# Patient Record
Sex: Male | Born: 1955
Health system: Southern US, Community
[De-identification: ages and names within clinical notes are randomized; demographics above are authoritative.]

## PROBLEM LIST (undated history)

## (undated) DIAGNOSIS — I619 Nontraumatic intracerebral hemorrhage, unspecified: Secondary | ICD-10-CM

## (undated) DIAGNOSIS — M1990 Unspecified osteoarthritis, unspecified site: Secondary | ICD-10-CM

## (undated) DIAGNOSIS — J189 Pneumonia, unspecified organism: Secondary | ICD-10-CM

## (undated) DIAGNOSIS — G709 Myoneural disorder, unspecified: Secondary | ICD-10-CM

## (undated) DIAGNOSIS — G4730 Sleep apnea, unspecified: Secondary | ICD-10-CM

## (undated) DIAGNOSIS — R410 Disorientation, unspecified: Secondary | ICD-10-CM

## (undated) DIAGNOSIS — R51 Headache: Secondary | ICD-10-CM

## (undated) DIAGNOSIS — G8929 Other chronic pain: Secondary | ICD-10-CM

## (undated) DIAGNOSIS — F19921 Other psychoactive substance use, unspecified with intoxication with delirium: Secondary | ICD-10-CM

## (undated) DIAGNOSIS — R519 Headache, unspecified: Secondary | ICD-10-CM

## (undated) DIAGNOSIS — M542 Cervicalgia: Secondary | ICD-10-CM

## (undated) DIAGNOSIS — I10 Essential (primary) hypertension: Secondary | ICD-10-CM

## (undated) DIAGNOSIS — K219 Gastro-esophageal reflux disease without esophagitis: Secondary | ICD-10-CM

## (undated) DIAGNOSIS — Z87442 Personal history of urinary calculi: Secondary | ICD-10-CM

## (undated) DIAGNOSIS — R0600 Dyspnea, unspecified: Secondary | ICD-10-CM

## (undated) DIAGNOSIS — J449 Chronic obstructive pulmonary disease, unspecified: Secondary | ICD-10-CM

## (undated) DIAGNOSIS — J45909 Unspecified asthma, uncomplicated: Secondary | ICD-10-CM

## (undated) DIAGNOSIS — T50905A Adverse effect of unspecified drugs, medicaments and biological substances, initial encounter: Secondary | ICD-10-CM

## (undated) HISTORY — PX: SHOULDER SURGERY: SHX246

## (undated) HISTORY — PX: NECK SURGERY: SHX720

## (undated) HISTORY — PX: NASAL SINUS SURGERY: SHX719

---

## 2007-06-29 ENCOUNTER — Ambulatory Visit: Payer: Self-pay | Admitting: Family Medicine

## 2007-07-11 ENCOUNTER — Ambulatory Visit: Payer: Self-pay | Admitting: Gastroenterology

## 2007-07-11 LAB — HM COLONOSCOPY

## 2007-12-28 ENCOUNTER — Ambulatory Visit: Payer: Self-pay | Admitting: Family Medicine

## 2008-01-05 ENCOUNTER — Ambulatory Visit: Payer: Self-pay | Admitting: Family Medicine

## 2008-03-07 ENCOUNTER — Ambulatory Visit: Payer: Self-pay | Admitting: Pain Medicine

## 2008-03-20 ENCOUNTER — Ambulatory Visit: Payer: Self-pay | Admitting: Pain Medicine

## 2008-04-05 ENCOUNTER — Ambulatory Visit: Payer: Self-pay | Admitting: Physician Assistant

## 2008-04-24 ENCOUNTER — Ambulatory Visit: Payer: Self-pay | Admitting: Pain Medicine

## 2008-05-10 ENCOUNTER — Ambulatory Visit: Payer: Self-pay | Admitting: Physician Assistant

## 2008-05-17 ENCOUNTER — Ambulatory Visit: Payer: Self-pay | Admitting: Pain Medicine

## 2008-05-31 ENCOUNTER — Ambulatory Visit: Payer: Self-pay | Admitting: Physician Assistant

## 2008-06-29 ENCOUNTER — Ambulatory Visit (HOSPITAL_COMMUNITY): Admission: RE | Admit: 2008-06-29 | Discharge: 2008-06-29 | Payer: Self-pay | Admitting: Neurosurgery

## 2008-11-19 ENCOUNTER — Ambulatory Visit (HOSPITAL_COMMUNITY): Admission: RE | Admit: 2008-11-19 | Discharge: 2008-11-20 | Payer: Self-pay | Admitting: Neurosurgery

## 2009-03-16 ENCOUNTER — Ambulatory Visit: Payer: Self-pay | Admitting: Neurosurgery

## 2009-06-19 ENCOUNTER — Encounter: Admission: RE | Admit: 2009-06-19 | Discharge: 2009-06-19 | Payer: Self-pay | Admitting: Neurology

## 2010-05-25 ENCOUNTER — Encounter: Payer: Self-pay | Admitting: Neurosurgery

## 2010-08-10 LAB — BASIC METABOLIC PANEL
Calcium: 9.9 mg/dL (ref 8.4–10.5)
Creatinine, Ser: 1 mg/dL (ref 0.4–1.5)
GFR calc Af Amer: >60 mL/min (ref >60)
Glucose, Bld: 106 mg/dL — ABNORMAL HIGH (ref 70–99)
Sodium: 136 mEq/L (ref 135–145)

## 2010-08-10 LAB — CBC
Hemoglobin: 15.4 g/dL (ref 13.0–17.0)
MCHC: 35.3 g/dL (ref 30.0–36.0)
RBC: 4.75 MIL/uL (ref 4.22–5.81)
RDW: 12.9 % (ref 11.5–15.5)
WBC: 8.8 10*3/uL (ref 4.0–10.5)

## 2010-08-29 ENCOUNTER — Ambulatory Visit: Payer: Self-pay

## 2010-09-16 NOTE — Op Note (Signed)
NAME:  Joshua Guerrero, Joshua Guerrero NO.:  [id number]   MEDICAL RECORD NO.:  [id number]          PATIENT TYPE:  OIB   LOCATION:  3533                         FACILITY:  [facility]   PHYSICIAN:  Cristi Loron, M.D.DATE OF BIRTH:  19-Nov-1955   DATE OF PROCEDURE:  11/19/2008  DATE OF DISCHARGE:                               OPERATIVE REPORT   BRIEF HISTORY:  The patient is a 55 year old white male who is suffering  from intractable neck pain.  He failed medical management was worked up  with a cervical MRI and some CT, which demonstrated the patient has  spondylosis and foraminal stenosis at C5-6.  I discussed the various  treatment options with the patient including surgery.  The patient has  weighed the risks, benefits, and alternatives of surgery, and decided to  proceed with C5-6 anterior cervical diskectomy/fusion and plating.   PREOPERATIVE DIAGNOSIS:  C5-6 disk degeneration and spondylosis,  stenosis, cervical radiculopathy, cervicalgia.   POSTOPERATIVE DIAGNOSIS:  C5-6 disk degeneration and spondylosis,  stenosis, cervical radiculopathy, cervicalgia.   PROCEDURE:  C5-6 extensive anterior cervical diskectomy/decompression;  C5-6 anterior interbody arthrodesis with local morselized autograft bone  and Actifuse bone graft extender; insertion of C5-6 interbody prosthesis  (Novel PEEK interbody prosthesis); C5-6 anterior cervical  instrumentation with Codman SLIM-LOC titanium plate and screws.   SURGEON:  Cristi Loron, M.D.   ASSISTANT:  Hilda Lias, M.D.   ANESTHESIA:  General endotracheal.   ESTIMATED BLOOD LOSS:  50 mL.   SPECIMENS:  None.   DRAINS:  None.   COMPLICATIONS:  None.   DESCRIPTION OF PROCEDURE:  The patient was brought to the operating room  by the anesthesia team.  General endotracheal anesthesia was induced.  The patient remained in a supine position.  A roll was placed under his  shoulder to place his neck in slight extension.  His  anterior cervical  region was then prepared with Betadine scrub and Betadine solution.  Sterile drapes were applied.  I then injected the area to be incised  with Marcaine with epinephrine solution.  I used a scalpel to make a  transverse incision in the patient's left anterior neck.  I used the  Metzenbaum scissors to divide the platysma muscle and then to dissect  medial to the sternocleidomastoid muscle, jugular vein, and carotid  artery.  I carefully dissected down towards the anterior cervical spine  identifying the esophagus and retracting it medially.  I then used the  Kittner swab to clear the soft tissue from the anterior cervical spine  and we inserted a bent spinal needle into upper exposed intervertebral  disk space.  We obtained intraoperative radiograph to confirm our  location.   We then used electrocautery to detach the medial border of the longus  colli muscle bilaterally from the C5-6 intervertebral disk space.  We  inserted the Caspar self-retaining retractor underneath the longus colli  muscle bilaterally to provide exposure.  We began the decompression by  incising the C5-6 intervertebral disk with a 15-blade scalpel.  We  performed a partial intervertebral diskectomy with the pituitary forceps  and the  Karlin curettes.  We then inserted distraction screws at C5-C6  and distracted the interspace and then we used the high-speed drill to  decorticate the vertebral endplates at C5-6, drilled away the remainder  of C5-6 intervertebral disk, drill away some posterior spondylosis and  to thin out the posterior longitudinal ligament.  We then incised the  ligament with arachnoid knife and then removed the ligament with the  Kerrison punch undercutting the vertebral endplates with Kerrison  punches, decompressing the thecal sac.  We then performed foraminotomies  about the bilateral C6 nerve root, completing the decompression at this  level.   Having completed  decompression, we now turned our attention to  arthrodesis.  We used the trial spacers and determined to use a medium 6-  mm PEEK interbody prosthesis.  We prefilled the prosthesis with a  combination of local morselized autograft bone.  We obtained  decompression as well as Actifuse bone graft extender.  We inserted the  prostheses into the distracted C5-C6 enriched space, we then removed the  distraction screws.  There was a good snug fit of the prosthesis.   We now turned our attention to the anterior spinal instrumentation.  We  used high-speed drill to remove some ventral spondylosis with the plate  will lay down flat.  We selected an appropriate length anterior cervical  plate.  We laid along the anterior aspect of the vertebral bodies at C5-  C6.  We then used a drill to drill two 40-mm holes at C5 two at C6.  We  secured the plate to their vertebral bodies by placing two 40-mm self-  tapping screws at C5 and two at C6.  We then obtained intraoperative  radiograph.  There was limited visualization of the instrumentation  because of the patient's habitus, but it looked good in vivo.  We  therefore, secured these screws to the plate by locking each cam.  This  completed the instrumentation.   We then obtained hemostasis using bipolar electrocautery.  We irrigated  the wound with bacitracin solution.  We then removed the retractor.  We  inspected the esophagus for any damages; there was none apparent.  We  then reapproximated the patient's platysma muscle with interrupted 3-0  Vicryl suture, the subcutaneous tissue with interrupted 3-0 Vicryl  suture, and the skin with Steri-Strips and benzoin.  The wound was then  coated with bacitracin ointment.  A sterile dressing was applied.  The  drapes were removed, and the patient was subsequently extubated by the  anesthesia team and transported to the postanesthesia care unit in  stable condition.  All sponge, instrument, and needle counts  were  correct at the end of this case.      Cristi Loron, M.D.  Electronically Signed     JDJ/MEDQ  D:  11/19/2008  T:  11/20/2008  Job:  [id number]

## 2010-12-04 ENCOUNTER — Ambulatory Visit: Payer: Self-pay | Admitting: Family Medicine

## 2011-12-07 ENCOUNTER — Ambulatory Visit: Payer: Self-pay

## 2011-12-11 ENCOUNTER — Ambulatory Visit: Payer: Self-pay | Admitting: Family Medicine

## 2011-12-12 ENCOUNTER — Emergency Department: Payer: Self-pay | Admitting: Emergency Medicine

## 2011-12-12 LAB — COMPREHENSIVE METABOLIC PANEL
Albumin: 3.3 g/dL — ABNORMAL LOW (ref 3.4–5.0)
Alkaline Phosphatase: 44 U/L — ABNORMAL LOW (ref 50–136)
Calcium, Total: 8.8 mg/dL (ref 8.5–10.1)
Glucose: 112 mg/dL — ABNORMAL HIGH (ref 65–99)
Osmolality: 270 (ref 275–301)
SGPT (ALT): 73 U/L (ref 12–78)
Sodium: 135 mmol/L — ABNORMAL LOW (ref 136–145)

## 2011-12-12 LAB — CBC
MCV: 85 fL (ref 80–100)
Platelet: 258 10*3/uL (ref 150–440)
RDW: 15.2 % — ABNORMAL HIGH (ref 11.5–14.5)
WBC: 7.6 10*3/uL (ref 3.8–10.6)

## 2011-12-12 LAB — URINALYSIS, COMPLETE
Bacteria: NONE SEEN
Blood: NEGATIVE
Glucose,UR: NEGATIVE mg/dL (ref 0–75)
Ketone: NEGATIVE
Nitrite: NEGATIVE
Protein: NEGATIVE
Specific Gravity: 1.003 (ref 1.003–1.030)
WBC UR: NONE SEEN /HPF (ref 0–5)

## 2011-12-28 ENCOUNTER — Ambulatory Visit: Payer: Self-pay | Admitting: Family Medicine

## 2012-05-02 ENCOUNTER — Ambulatory Visit (HOSPITAL_COMMUNITY)
Admission: RE | Admit: 2012-05-02 | Discharge: 2012-05-02 | Disposition: A | Discharge status: Home / Self Care | Payer: PRIVATE HEALTH INSURANCE | Source: Ambulatory Visit | Attending: Physical Medicine and Rehabilitation | Admitting: Physical Medicine and Rehabilitation

## 2012-05-02 ENCOUNTER — Other Ambulatory Visit (HOSPITAL_COMMUNITY): Payer: Self-pay | Admitting: Physical Medicine and Rehabilitation

## 2012-05-02 DIAGNOSIS — M1710 Unilateral primary osteoarthritis, unspecified knee: Secondary | ICD-10-CM

## 2012-05-02 DIAGNOSIS — IMO0002 Reserved for concepts with insufficient information to code with codable children: Secondary | ICD-10-CM | POA: Insufficient documentation

## 2012-05-02 DIAGNOSIS — M898X9 Other specified disorders of bone, unspecified site: Secondary | ICD-10-CM | POA: Insufficient documentation

## 2012-12-29 LAB — TSH: TSH: 1.84 u[IU]/mL (ref <=5.90)

## 2013-05-05 ENCOUNTER — Ambulatory Visit: Payer: Self-pay | Admitting: Physical Medicine and Rehabilitation

## 2014-03-21 ENCOUNTER — Ambulatory Visit: Payer: Self-pay | Admitting: Family Medicine

## 2014-03-21 LAB — BASIC METABOLIC PANEL
BUN: 6 mg/dL (ref 4–21)
Creatinine: 0.6 mg/dL (ref <=1.3)
Glucose: 98 mg/dL
Potassium: 3.8 mmol/L (ref 3.4–5.3)
Sodium: 129 mmol/L — AB (ref 137–147)

## 2014-03-21 LAB — LIPID PANEL
Cholesterol: 205 mg/dL — AB (ref 0–200)
HDL: 35 mg/dL (ref 35–70)
LDL CALC: 130 mg/dL
LDL/HDL RATIO: 3.7
Triglycerides: 201 mg/dL — AB (ref 40–160)

## 2014-03-21 LAB — HEPATIC FUNCTION PANEL
ALT: 19 U/L (ref 10–40)
AST: 21 U/L (ref 14–40)
Alkaline Phosphatase: 40 U/L (ref 25–125)
BILIRUBIN, TOTAL: 0.3 mg/dL

## 2014-08-28 ENCOUNTER — Ambulatory Visit
Admit: 2014-08-28 | Disposition: A | Discharge status: Home / Self Care | Payer: Self-pay | Attending: Family Medicine | Admitting: Family Medicine

## 2014-09-18 LAB — CBC AND DIFFERENTIAL
HCT: 43 % (ref 41–53)
Hemoglobin: 14.8 g/dL (ref 13.5–17.5)
NEUTROS ABS: 81 /uL
Platelets: 321 10*3/uL (ref 150–399)
WBC: 9.2 10*3/mL

## 2014-09-22 DIAGNOSIS — M6282 Rhabdomyolysis: Secondary | ICD-10-CM | POA: Insufficient documentation

## 2014-09-22 DIAGNOSIS — K219 Gastro-esophageal reflux disease without esophagitis: Secondary | ICD-10-CM | POA: Insufficient documentation

## 2014-09-22 DIAGNOSIS — E785 Hyperlipidemia, unspecified: Secondary | ICD-10-CM | POA: Insufficient documentation

## 2014-09-22 DIAGNOSIS — F329 Major depressive disorder, single episode, unspecified: Secondary | ICD-10-CM | POA: Insufficient documentation

## 2014-09-22 DIAGNOSIS — M542 Cervicalgia: Secondary | ICD-10-CM | POA: Insufficient documentation

## 2014-09-22 DIAGNOSIS — F419 Anxiety disorder, unspecified: Secondary | ICD-10-CM | POA: Insufficient documentation

## 2014-09-22 DIAGNOSIS — F32A Depression, unspecified: Secondary | ICD-10-CM | POA: Insufficient documentation

## 2014-09-22 DIAGNOSIS — M549 Dorsalgia, unspecified: Secondary | ICD-10-CM

## 2014-09-22 DIAGNOSIS — G4730 Sleep apnea, unspecified: Secondary | ICD-10-CM | POA: Insufficient documentation

## 2014-09-22 DIAGNOSIS — G8929 Other chronic pain: Secondary | ICD-10-CM | POA: Insufficient documentation

## 2014-09-22 DIAGNOSIS — K429 Umbilical hernia without obstruction or gangrene: Secondary | ICD-10-CM | POA: Insufficient documentation

## 2014-09-22 DIAGNOSIS — D649 Anemia, unspecified: Secondary | ICD-10-CM | POA: Insufficient documentation

## 2014-09-22 DIAGNOSIS — M5412 Radiculopathy, cervical region: Secondary | ICD-10-CM | POA: Insufficient documentation

## 2014-09-22 DIAGNOSIS — E611 Iron deficiency: Secondary | ICD-10-CM | POA: Insufficient documentation

## 2014-09-22 DIAGNOSIS — I10 Essential (primary) hypertension: Secondary | ICD-10-CM | POA: Insufficient documentation

## 2014-10-19 ENCOUNTER — Other Ambulatory Visit: Payer: Self-pay | Admitting: Family Medicine

## 2014-10-31 ENCOUNTER — Other Ambulatory Visit: Payer: Self-pay

## 2014-11-01 ENCOUNTER — Other Ambulatory Visit: Payer: Self-pay | Admitting: Family Medicine

## 2014-11-01 NOTE — Telephone Encounter (Signed)
This is Dr. Alben Spittle patient please review.-aa

## 2014-11-15 ENCOUNTER — Ambulatory Visit: Payer: Self-pay | Admitting: Family Medicine

## 2014-11-20 ENCOUNTER — Ambulatory Visit (INDEPENDENT_AMBULATORY_CARE_PROVIDER_SITE_OTHER): Payer: Medicare Other | Admitting: Family Medicine

## 2014-11-20 ENCOUNTER — Encounter: Payer: Self-pay | Admitting: Family Medicine

## 2014-11-20 VITALS — BP 118/64 | HR 76 | Resp 12 | Ht 68.0 in | Wt 206.0 lb

## 2014-11-20 DIAGNOSIS — J849 Interstitial pulmonary disease, unspecified: Secondary | ICD-10-CM | POA: Diagnosis not present

## 2014-11-20 DIAGNOSIS — F329 Major depressive disorder, single episode, unspecified: Secondary | ICD-10-CM | POA: Diagnosis not present

## 2014-11-20 DIAGNOSIS — Z1211 Encounter for screening for malignant neoplasm of colon: Secondary | ICD-10-CM | POA: Diagnosis not present

## 2014-11-20 DIAGNOSIS — G8929 Other chronic pain: Secondary | ICD-10-CM

## 2014-11-20 DIAGNOSIS — M542 Cervicalgia: Secondary | ICD-10-CM | POA: Diagnosis not present

## 2014-11-20 DIAGNOSIS — M549 Dorsalgia, unspecified: Secondary | ICD-10-CM

## 2014-11-20 DIAGNOSIS — F419 Anxiety disorder, unspecified: Secondary | ICD-10-CM

## 2014-11-20 DIAGNOSIS — F32A Depression, unspecified: Secondary | ICD-10-CM

## 2014-11-20 DIAGNOSIS — M6282 Rhabdomyolysis: Secondary | ICD-10-CM | POA: Insufficient documentation

## 2014-11-20 MED ORDER — ALPRAZOLAM 1 MG PO TABS: ORAL_TABLET | ORAL | Status: DC
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
## 2014-12-04 ENCOUNTER — Other Ambulatory Visit: Payer: Self-pay

## 2014-12-04 ENCOUNTER — Telehealth: Payer: Self-pay | Admitting: Gastroenterology

## 2014-12-04 NOTE — Telephone Encounter (Signed)
Gastroenterology Pre-Procedure Review  Request Date: 12-31-2014 Requesting Physician: Dr. Rosanna Randy  PATIENT REVIEW QUESTIONS: The patient responded to the following health history questions as indicated:    1. Are you having any GI issues? no 2. Do you have a personal history of Polyps? yes (Colon at 81) 3. Do you have a family history of Colon Cancer or Polyps? no 4. Diabetes Mellitus? no 5. Joint replacements in the past 12 months?no 6. Major health problems in the past 3 months?no 7. Any artificial heart valves, MVP, or defibrillator?no    MEDICATIONS & ALLERGIES:    Patient reports the following regarding taking any anticoagulation/antiplatelet therapy:   Plavix, Coumadin, Eliquis, Xarelto, Lovenox, Pradaxa, Brilinta, or Effient? no Aspirin? no  Patient confirms/reports the following medications:  Current Outpatient Prescriptions  Medication Sig Dispense Refill   ALPRAZolam (XANAX) 1 MG tablet Take 1 tablet three times daily as needed 90 tablet 5   bisoprolol-hydrochlorothiazide (ZIAC) 5-6.25 MG per tablet Take by mouth.     cyclobenzaprine (FLEXERIL) 5 MG tablet Take by mouth.     fenofibrate (TRICOR) 145 MG tablet Take by mouth.     fentaNYL (DURAGESIC - DOSED MCG/HR) 50 MCG/HR Place onto the skin.     furosemide (LASIX) 40 MG tablet TAKE ONE TO TWO TABLETS IN THE MORNING AS NEEDED FOR SWELLING 60 tablet 12   lansoprazole (PREVACID) 30 MG capsule Take by mouth.     oxyCODONE-acetaminophen (PERCOCET) 10-325 MG per tablet Take 1 tablet by mouth every 4 (four) hours as needed.  0   promethazine (PHENERGAN) 25 MG tablet Take by mouth.     venlafaxine (EFFEXOR) 75 MG tablet Take by mouth.     No current facility-administered medications for this visit.    Patient confirms/reports the following allergies:  Allergies  Allergen Reactions   Codeine Itching and Nausea And Vomiting    No orders of the defined types were placed in this encounter.    AUTHORIZATION  INFORMATION Primary Insurance: 1D#: Group #:  Secondary Insurance: 1D#: Group #:  SCHEDULE INFORMATION: Date: 12-31-2014 Time: Location:[facility]

## 2014-12-24 ENCOUNTER — Encounter: Payer: Self-pay | Admitting: *Deleted

## 2014-12-25 ENCOUNTER — Encounter: Payer: Self-pay | Admitting: Anesthesiology

## 2015-01-01 ENCOUNTER — Encounter: Payer: Self-pay | Admitting: *Deleted

## 2015-01-10 NOTE — Discharge Instructions (Signed)

## 2015-01-14 ENCOUNTER — Ambulatory Visit: Admission: RE | Admit: 2015-01-14 | Payer: Medicare Other | Source: Ambulatory Visit | Admitting: Gastroenterology

## 2015-01-14 HISTORY — DX: Headache, unspecified: R51.9

## 2015-01-14 HISTORY — DX: Sleep apnea, unspecified: G47.30

## 2015-01-14 HISTORY — DX: Myoneural disorder, unspecified: G70.9

## 2015-01-14 HISTORY — DX: Gastro-esophageal reflux disease without esophagitis: K21.9

## 2015-01-14 HISTORY — DX: Unspecified osteoarthritis, unspecified site: M19.90

## 2015-01-14 HISTORY — DX: Headache: R51

## 2015-01-14 HISTORY — DX: Essential (primary) hypertension: I10

## 2015-01-14 SURGERY — COLONOSCOPY WITH PROPOFOL
Anesthesia: Choice
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
## 2015-02-07 ENCOUNTER — Other Ambulatory Visit: Payer: Self-pay

## 2015-02-08 ENCOUNTER — Encounter: Payer: Self-pay | Admitting: Anesthesiology

## 2015-02-08 NOTE — Discharge Instructions (Signed)

## 2015-02-11 ENCOUNTER — Ambulatory Visit: Payer: Medicare Other | Admitting: Anesthesiology

## 2015-02-11 ENCOUNTER — Encounter
Admission: RE | Disposition: A | Discharge status: Home / Self Care | Payer: Self-pay | Source: Ambulatory Visit | Attending: Gastroenterology

## 2015-02-11 ENCOUNTER — Ambulatory Visit
Admission: RE | Admit: 2015-02-11 | Discharge: 2015-02-11 | Disposition: A | Discharge status: Home / Self Care | Payer: Medicare Other | Source: Ambulatory Visit | Attending: Gastroenterology | Admitting: Gastroenterology

## 2015-02-11 ENCOUNTER — Encounter: Payer: Self-pay | Admitting: Anesthesiology

## 2015-02-11 DIAGNOSIS — Z79899 Other long term (current) drug therapy: Secondary | ICD-10-CM | POA: Diagnosis not present

## 2015-02-11 DIAGNOSIS — F17210 Nicotine dependence, cigarettes, uncomplicated: Secondary | ICD-10-CM | POA: Insufficient documentation

## 2015-02-11 DIAGNOSIS — K219 Gastro-esophageal reflux disease without esophagitis: Secondary | ICD-10-CM | POA: Diagnosis not present

## 2015-02-11 DIAGNOSIS — M1990 Unspecified osteoarthritis, unspecified site: Secondary | ICD-10-CM | POA: Insufficient documentation

## 2015-02-11 DIAGNOSIS — Z860100 Personal history of colon polyps, unspecified: Secondary | ICD-10-CM | POA: Insufficient documentation

## 2015-02-11 DIAGNOSIS — K639 Disease of intestine, unspecified: Secondary | ICD-10-CM | POA: Diagnosis not present

## 2015-02-11 DIAGNOSIS — Z885 Allergy status to narcotic agent status: Secondary | ICD-10-CM | POA: Insufficient documentation

## 2015-02-11 DIAGNOSIS — D125 Benign neoplasm of sigmoid colon: Secondary | ICD-10-CM | POA: Diagnosis not present

## 2015-02-11 DIAGNOSIS — I10 Essential (primary) hypertension: Secondary | ICD-10-CM | POA: Insufficient documentation

## 2015-02-11 DIAGNOSIS — K640 First degree hemorrhoids: Secondary | ICD-10-CM | POA: Diagnosis not present

## 2015-02-11 DIAGNOSIS — D124 Benign neoplasm of descending colon: Secondary | ICD-10-CM

## 2015-02-11 DIAGNOSIS — R252 Cramp and spasm: Secondary | ICD-10-CM | POA: Diagnosis not present

## 2015-02-11 DIAGNOSIS — G4730 Sleep apnea, unspecified: Secondary | ICD-10-CM | POA: Insufficient documentation

## 2015-02-11 HISTORY — PX: COLONOSCOPY WITH PROPOFOL: SHX5780

## 2015-02-11 HISTORY — DX: Disorientation, unspecified: R41.0

## 2015-02-11 HISTORY — DX: Adverse effect of unspecified drugs, medicaments and biological substances, initial encounter: T50.905A

## 2015-02-11 HISTORY — DX: Other psychoactive substance use, unspecified with intoxication with delirium: F19.921

## 2015-02-11 SURGERY — COLONOSCOPY WITH PROPOFOL
Anesthesia: Monitor Anesthesia Care

## 2015-02-11 MED ORDER — STERILE WATER FOR IRRIGATION IR SOLN
Status: DC | PRN
  Administered 2015-02-11: 08:00:00

## 2015-02-11 MED ORDER — LACTATED RINGERS IV SOLN
INTRAVENOUS | Status: DC
  Administered 2015-02-11: 08:00:00 via INTRAVENOUS

## 2015-02-11 MED ORDER — LIDOCAINE HCL (CARDIAC) 20 MG/ML IV SOLN
INTRAVENOUS | Status: DC | PRN
  Administered 2015-02-11: 30 mg via INTRAVENOUS

## 2015-02-11 MED ORDER — PROPOFOL 10 MG/ML IV BOLUS
INTRAVENOUS | Status: DC | PRN
  Administered 2015-02-11: 30 mg via INTRAVENOUS
  Administered 2015-02-11 (×2): 40 mg via INTRAVENOUS
  Administered 2015-02-11: 150 mg via INTRAVENOUS
  Administered 2015-02-11: 20 mg via INTRAVENOUS
  Administered 2015-02-11 (×2): 40 mg via INTRAVENOUS

## 2015-02-11 SURGICAL SUPPLY — 28 items
CANISTER SUCT 1200ML W/VALVE (MISCELLANEOUS) ×2 IMPLANT
FCP ESCP3.2XJMB 240X2.8X (MISCELLANEOUS)
FORCEPS BIOP RAD 4 LRG CAP 4 (CUTTING FORCEPS) ×2 IMPLANT
FORCEPS BIOP RJ4 240 W/NDL (MISCELLANEOUS)
FORCEPS ESCP3.2XJMB 240X2.8X (MISCELLANEOUS) IMPLANT
GOWN CVR UNV OPN BCK APRN NK (MISCELLANEOUS) ×2 IMPLANT
GOWN ISOL THUMB LOOP REG UNIV (MISCELLANEOUS) ×2
HEMOCLIP INSTINCT (CLIP) IMPLANT
INJECTOR VARIJECT VIN23 (MISCELLANEOUS) IMPLANT
KIT CO2 TUBING (TUBING) IMPLANT
KIT DEFENDO VALVE AND CONN (KITS) IMPLANT
KIT ENDO PROCEDURE OLY (KITS) ×2 IMPLANT
LIGATOR MULTIBAND 6SHOOTER MBL (MISCELLANEOUS) IMPLANT
MARKER SPOT ENDO TATTOO 5ML (MISCELLANEOUS) IMPLANT
PAD GROUND ADULT SPLIT (MISCELLANEOUS) IMPLANT
SNARE SHORT THROW 13M SML OVAL (MISCELLANEOUS) IMPLANT
SNARE SHORT THROW 30M LRG OVAL (MISCELLANEOUS) IMPLANT
SPOT EX ENDOSCOPIC TATTOO (MISCELLANEOUS)
SUCTION POLY TRAP 4CHAMBER (MISCELLANEOUS) IMPLANT
TRAP SUCTION POLY (MISCELLANEOUS) IMPLANT
TUBING CONN 6MMX3.1M (TUBING)
TUBING SUCTION CONN 0.25 STRL (TUBING) IMPLANT
UNDERPAD 30X60 958B10 (PK) (MISCELLANEOUS) IMPLANT
VALVE BIOPSY ENDO (VALVE) IMPLANT
VARIJECT INJECTOR VIN23 (MISCELLANEOUS)
WATER AUXILLARY (MISCELLANEOUS) IMPLANT
WATER STERILE IRR 250ML POUR (IV SOLUTION) ×2 IMPLANT
WATER STERILE IRR 500ML POUR (IV SOLUTION) IMPLANT

## 2015-02-11 NOTE — Anesthesia Postprocedure Evaluation (Signed)
  Anesthesia Post-op Note  Patient: Joshua Guerrero  Procedure(s) Performed: Procedure(s) with comments: COLONOSCOPY WITH PROPOFOL (N/A) - USES C-PAP  Anesthesia type:MAC  Patient location: PACU  Post pain: Pain level controlled  Post assessment: Post-op Vital signs reviewed, Patient's Cardiovascular Status Stable, Respiratory Function Stable, Patent Airway and No signs of Nausea or vomiting  Post vital signs: Reviewed and stable  Last Vitals:  Filed Vitals:   02/11/15 0830  BP: 111/76  Pulse: 80  Temp:   Resp: 19    Level of consciousness: awake, alert  and patient cooperative  Complications: No apparent anesthesia complications

## 2015-02-11 NOTE — Transfer of Care (Signed)
Immediate Anesthesia Transfer of Care Note  Patient: Joshua Guerrero  Procedure(s) Performed: Procedure(s) with comments: COLONOSCOPY WITH PROPOFOL (N/A) - USES C-PAP  Patient Location: PACU  Anesthesia Type: MAC  Level of Consciousness: awake, alert  and patient cooperative  Airway and Oxygen Therapy: Patient Spontanous Breathing and Patient connected to supplemental oxygen  Post-op Assessment: Post-op Vital signs reviewed, Patient's Cardiovascular Status Stable, Respiratory Function Stable, Patent Airway and No signs of Nausea or vomiting  Post-op Vital Signs: Reviewed and stable  Complications: No apparent anesthesia complications

## 2015-02-11 NOTE — H&P (Signed)
[facility]  [address], [place], [place] [address] Phone: [phone] Fax : [phone]  Primary Care Physician:  Joshua Durie, MD Primary Gastroenterologist:  Dr. Allen Guerrero  Pre-Procedure History & Physical: HPI:  Joshua Guerrero is a 59 y.o. male is here for an colonoscopy.   Past Medical History  Diagnosis Date  . Headache     past hx, secondary to nerve damage from accident  . Hypertension     CONTROLLED ON MEDS  . 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 Confusion caused by a drug (Joshua Guerrero)     fentanyl and oxycodone  . Arthritis     lower back  . GERD (gastroesophageal reflux disease)   . Sleep apnea     USES C-PAP    Past Surgical History  Procedure Laterality Date  . Nasal sinus surgery      due to severe sleep apnea  . Shoulder surgery Bilateral     x 2  . Neck surgery      diffuse disc-and put in possibly screws patient thinks    Prior to Admission medications   Medication Sig Start Date End Date Taking? Authorizing Provider  ALPRAZolam Joshua Guerrero) 1 MG tablet Take 1 tablet three times daily as needed Patient taking differently: Take 1 tablet three times daily as needed/ normally evening and bedtime 11/20/14  Yes Joshua Maceo Pro., MD  Ascorbic Acid (VITAMIN C PO) Take by mouth. am   Yes Historical Provider, MD  B Complex Vitamins (VITAMIN B COMPLEX PO) Take by mouth. am   Yes Historical Provider, MD  bisoprolol-hydrochlorothiazide [facility]) 5-6.25 MG per tablet Take by mouth daily. am 03/27/14  Yes Historical Provider, MD  cyclobenzaprine (FLEXERIL) 5 MG tablet Take 5 mg by mouth 3 (three) times daily as needed.  07/16/11  Yes Historical Provider, MD  fenofibrate (TRICOR) 145 MG tablet Take by mouth. am 07/16/14  Yes Historical Provider, MD  fentaNYL (DURAGESIC - DOSED MCG/HR) 50 MCG/HR Place onto the skin.  12/15/11  Yes Historical Provider, MD  IRON PO Take by mouth daily. am   Yes Historical  Provider, MD  lansoprazole (PREVACID) 30 MG capsule Take 30 mg by mouth 2 (two) times daily before a meal. As needed 03/27/14  Yes Historical Provider, MD  Multiple Vitamin (MULTIVITAMIN) capsule Take 1 capsule by mouth daily.   Yes Historical Provider, MD  oxyCODONE-acetaminophen (PERCOCET) 10-325 MG per tablet Take 1 tablet by mouth every 4 (four) hours as needed. 11/13/14  Yes Historical Provider, MD  promethazine (PHENERGAN) 25 MG tablet Take by mouth every 6 (six) hours as needed.  07/16/14  Yes Historical Provider, MD  furosemide (LASIX) 40 MG tablet TAKE ONE TO TWO TABLETS IN THE MORNING AS NEEDED FOR SWELLING Patient not taking: Reported on 02/07/2015 10/19/14   Joshua Guerrero., MD  venlafaxine [facility]) 75 MG tablet Take by mouth. 09/18/14   Historical Provider, MD    Allergies as of 02/07/2015 - Review Complete 02/07/2015  Allergen Reaction Noted  . Codeine Itching and Nausea And Vomiting 09/22/2014  . Opana [oxymorphone] Swelling 12/24/2014    Family History  Problem Relation Age of Onset  . Dementia Father     Social History   Social History  . Marital Status: Divorced    Spouse Name: single  . Number of Children: 0  . Years of Education: 12   Occupational History  . disability  Social History Main Topics  . Smoking status: Light Tobacco Smoker -- 0.25 packs/day for 20 years    Types: Cigarettes  . Smokeless tobacco: Never Used     Comment: Had quit for 8 yrs. Father died 2023-01-22. has had several cigarettes last few days.  . Alcohol Use: No  . Drug Use: No  . Sexual Activity: No   Other Topics Concern  . Not on file   Social History Narrative    Review of Systems: See HPI, otherwise negative ROS  Physical Exam: BP 154/98 mmHg  Pulse 84  Temp(Src) 97.9 F (36.6 C) (Temporal)  Resp 16  Ht 5\' 8"  (1.727 m)  Wt 197 lb (89.359 kg)  BMI 29.96 kg/m2  SpO2 98% General:   Alert,  pleasant and cooperative in NAD Head:  Normocephalic and atraumatic. Neck:   Supple; no masses or thyromegaly. Lungs:  Clear throughout to auscultation.    Heart:  Regular rate and rhythm. Abdomen:  Soft, nontender and nondistended. Normal bowel sounds, without guarding, and without rebound.   Neurologic:  Alert and  oriented x4;  grossly normal neurologically.  Impression/Plan: Joshua Guerrero is here for an colonoscopy to be performed for history of colon polyps.  Risks, benefits, limitations, and alternatives regarding  colonoscopy have been reviewed with the patient.  Questions have been answered.  All parties agreeable.   Joshua Bowl, MD  02/11/2015, 7:56 AM

## 2015-02-11 NOTE — Anesthesia Preprocedure Evaluation (Signed)
Anesthesia Evaluation  Patient identified by MRN, date of birth, ID band  Reviewed: NPO status   History of Anesthesia Complications Negative for: history of anesthetic complications  Airway Mallampati: II  TM Distance: >3 FB Neck ROM: limited   Comment: Limited neck extension due to neck surgery Dental  (+) Missing Poor dentition:   Pulmonary sleep apnea and Continuous Positive Airway Pressure Ventilation , Current Smoker,    Pulmonary exam normal        Cardiovascular Exercise Tolerance: Good hypertension, Normal cardiovascular exam     Neuro/Psych  Headaches, Anxiety chronic back and Neck pain; arm and leg from 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  negative psych ROS   GI/Hepatic Neg liver ROS, GERD  Controlled,  Endo/Other  negative endocrine ROS  Renal/GU negative Renal ROS  negative genitourinary   Musculoskeletal  (+) Arthritis , Muscle cramps    Abdominal   Peds  Hematology negative hematology ROS (+)   Anesthesia Other Findings   Reproductive/Obstetrics                             Anesthesia Physical Anesthesia Plan  ASA: II  Anesthesia Plan: MAC   Post-op Pain Management:    Induction:   Airway Management Planned:   Additional Equipment:   Intra-op Plan:   Post-operative Plan:   Informed Consent: I have reviewed the patients History and Physical, chart, labs and discussed the procedure including the risks, benefits and alternatives for the proposed anesthesia with the patient or authorized representative who has indicated his/her understanding and acceptance.     Plan Discussed with: CRNA  Anesthesia Plan Comments:         Anesthesia Quick Evaluation

## 2015-02-11 NOTE — Anesthesia Procedure Notes (Signed)
Procedure Name: MAC Date/Time: 02/11/2015 8:01 AM Performed by: Cameron Ali Pre-anesthesia Checklist: Patient identified, Emergency Drugs available, Suction available, Timeout performed and Patient being monitored Patient Re-evaluated:Patient Re-evaluated prior to inductionOxygen Delivery Method: Nasal cannula Placement Confirmation: positive ETCO2

## 2015-02-11 NOTE — Op Note (Signed)
[facility] Patient Name: Joshua Guerrero Procedure Date: 02/11/2015 7:58 AM MRN: [id number] Account #: [id number] Date of Birth: 1955/10/17 Admit Type: Outpatient Age: 59 Room: [facility] OR ROOM 01 Gender: Male Note Status: Finalized Procedure:         Colonoscopy Indications:       High risk colon cancer surveillance: Personal history of                     colonic polyps Providers:         Lucilla Lame, MD Referring MD:      [name], MD (Referring MD) Medicines:         Propofol per Anesthesia Complications:     No immediate complications. Procedure:         Pre-Anesthesia Assessment:                    - Prior to the procedure, a History and Physical was                     performed, and patient medications and allergies were                     reviewed. The patient's tolerance of previous anesthesia                     was also reviewed. The risks and benefits of the procedure                     and the sedation options and risks were discussed with the                     patient. All questions were answered, and informed consent                     was obtained. Prior Anticoagulants: The patient has taken                     no previous anticoagulant or antiplatelet agents. ASA                     Grade Assessment: II - A patient with mild systemic                     disease. After reviewing the risks and benefits, the                     patient was deemed in satisfactory condition to undergo                     the procedure.                    After obtaining informed consent, the colonoscope was                     passed under direct vision. Throughout the procedure, the                     patient's blood pressure, pulse, and oxygen saturations                     were monitored continuously. The was introduced through  the anus and advanced to the the cecum, identified by                     appendiceal  orifice and ileocecal valve. The colonoscopy                     was performed without difficulty. The patient tolerated                     the procedure well. The quality of the bowel preparation                     was fair. Findings:      The perianal and digital rectal examinations were normal.      A diffuse area of severely erythematous mucosa was found in the       transverse colon, at the hepatic flexure, in the ascending colon and in       the cecum. Biopsies were taken with a cold forceps for histology.      A 5 mm polyp was found in the descending colon. The polyp was sessile.       The polyp was removed with a cold biopsy forceps. Resection and       retrieval were complete.      Two sessile polyps were found in the sigmoid colon. The polyps were 3 to       5 mm in size. These polyps were removed with a cold biopsy forceps.       Resection and retrieval were complete.      Non-bleeding internal hemorrhoids were found during retroflexion. The       hemorrhoids were Grade I (internal hemorrhoids that do not prolapse). Impression:        - Erythematous mucosa in the transverse colon, at the                     hepatic flexure, in the ascending colon and in the cecum.                     Biopsied.                    - Right colon cosistent with ischemic colitis.                    - One 5 mm polyp in the descending colon. Resected and                     retrieved.                    - Two 3 to 5 mm polyps in the sigmoid colon. Resected and                     retrieved.                    - Non-bleeding internal hemorrhoids. Recommendation:    - Await pathology results. Procedure Code(s): --- Professional ---                    [phone], Colonoscopy, flexible; with biopsy, single or                     multiple Diagnosis Code(s): --- Professional ---  Z86.010, Personal history of colonic polyps                    K63.9, Disease of intestine, unspecified                     D12.4, Benign neoplasm of descending colon                    D12.5, Benign neoplasm of sigmoid colon CPT copyright 2014 American Medical Association. All rights reserved. The codes documented in this report are preliminary and upon coder review may  be revised to meet current compliance requirements. Lucilla Lame, MD 02/11/2015 8:25:32 AM This report has been signed electronically. Number of Addenda: 0 Note Initiated On: 02/11/2015 7:58 AM Scope Withdrawal Time: 0 hours 8 minutes 57 seconds  Total Procedure Duration: 0 hours 13 minutes 32 seconds       [facility]

## 2015-02-12 ENCOUNTER — Encounter: Payer: Self-pay | Admitting: Gastroenterology

## 2015-02-20 ENCOUNTER — Ambulatory Visit (INDEPENDENT_AMBULATORY_CARE_PROVIDER_SITE_OTHER): Payer: Medicare Other | Admitting: Family Medicine

## 2015-02-20 ENCOUNTER — Encounter: Payer: Self-pay | Admitting: Family Medicine

## 2015-02-20 VITALS — BP 128/70 | HR 68 | Temp 98.8°F | Resp 16 | Wt 205.0 lb

## 2015-02-20 DIAGNOSIS — K529 Noninfective gastroenteritis and colitis, unspecified: Secondary | ICD-10-CM

## 2015-02-20 DIAGNOSIS — M542 Cervicalgia: Secondary | ICD-10-CM | POA: Diagnosis not present

## 2015-02-20 DIAGNOSIS — F419 Anxiety disorder, unspecified: Secondary | ICD-10-CM

## 2015-02-20 DIAGNOSIS — F329 Major depressive disorder, single episode, unspecified: Secondary | ICD-10-CM

## 2015-02-20 DIAGNOSIS — M549 Dorsalgia, unspecified: Secondary | ICD-10-CM

## 2015-02-20 DIAGNOSIS — I10 Essential (primary) hypertension: Secondary | ICD-10-CM

## 2015-02-20 DIAGNOSIS — G8929 Other chronic pain: Secondary | ICD-10-CM | POA: Diagnosis not present

## 2015-02-20 DIAGNOSIS — Z23 Encounter for immunization: Secondary | ICD-10-CM

## 2015-02-20 DIAGNOSIS — F32A Depression, unspecified: Secondary | ICD-10-CM

## 2015-02-20 MED ORDER — METOPROLOL SUCCINATE ER 25 MG PO TB24: 25.0000 mg | ORAL_TABLET | Freq: Every day | ORAL | Status: DC

## 2015-02-20 MED ORDER — PROMETHAZINE HCL 25 MG PO TABS: 25.0000 mg | ORAL_TABLET | Freq: Four times a day (QID) | ORAL | Status: DC | PRN

## 2015-02-20 MED ORDER — HYDROCHLOROTHIAZIDE 12.5 MG PO TABS: 12.5000 mg | ORAL_TABLET | Freq: Every day | ORAL | Status: DC

## 2015-02-20 NOTE — Progress Notes (Signed)
Patient ID: Joshua Guerrero, male   DOB: 04/30/1956, 59 y.o.   MRN: [id number]    Subjective:  HPI  Depression follow up: Patient is here for follow up. Patient states he tried Effexor 2 tablets daily for about 1 month and it made him too space, then he tried 1 tablet daily and now he is not on any medication. He feels about the same, can not tell a difference, he states more of an anxiety issue then depression right now. He does take Xanax 1/2 tablet in the morning, 1/2 tablet about 3 pm and then 9 pm 1 tablet-sometimes will take another tablet during the night if he can not fall asleep.  Chronic pain Symptoms are the same. He takes Oxycodone- every 4 hours, and applies patch-he uses Phenergan to help with nausea from the patches.  Prior to Admission medications   Medication Sig Start Date End Date Taking? Authorizing Provider  ALPRAZolam (XANAX) 1 MG tablet Take 1 tablet three times daily as needed Patient taking differently: Take 1 tablet three times daily as needed/ normally evening and bedtime 11/20/14  Yes Richard L Gilbert Jr., MD  Ascorbic Acid (VITAMIN C PO) Take by mouth. am   Yes Historical Provider, MD  B Complex Vitamins (VITAMIN B COMPLEX PO) Take by mouth. am   Yes Historical Provider, MD  bisoprolol-hydrochlorothiazide (ZIAC) 5-6.25 MG per tablet Take by mouth daily. am 03/27/14  Yes Historical Provider, MD  cyclobenzaprine (FLEXERIL) 5 MG tablet Take 5 mg by mouth 3 (three) times daily as needed.  07/16/11  Yes Historical Provider, MD  fenofibrate (TRICOR) 145 MG tablet Take by mouth. am 07/16/14  Yes Historical Provider, MD  fentaNYL (DURAGESIC - DOSED MCG/HR) 50 MCG/HR Place onto the skin.  12/15/11  Yes Historical Provider, MD  furosemide (LASIX) 40 MG tablet TAKE ONE TO TWO TABLETS IN THE MORNING AS NEEDED FOR SWELLING 10/19/14  Yes Richard L Gilbert Jr., MD  IRON PO Take by mouth daily. am   Yes Historical Provider, MD  lansoprazole (PREVACID) 30 MG capsule Take 30 mg by mouth  2 (two) times daily before a meal. As needed 03/27/14  Yes Historical Provider, MD  Multiple Vitamin (MULTIVITAMIN) capsule Take 1 capsule by mouth daily.   Yes Historical Provider, MD  oxyCODONE-acetaminophen (PERCOCET) 10-325 MG per tablet Take 1 tablet by mouth every 4 (four) hours as needed. 11/13/14  Yes Historical Provider, MD  promethazine (PHENERGAN) 25 MG tablet Take by mouth every 6 (six) hours as needed.  07/16/14  Yes Historical Provider, MD  venlafaxine (EFFEXOR) 75 MG tablet Take by mouth. 09/18/14  Yes Historical Provider, MD    Patient Active Problem List   Diagnosis Date Noted  . Personal history of colonic polyps   . Disease of colon   . Benign neoplasm of descending colon   . Benign neoplasm of sigmoid colon   . Rhabdomyolysis 11/20/2014  . Absolute anemia 09/22/2014  . Anxiety 09/22/2014  . 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 Back pain, chronic 09/22/2014  . Chronic cervical pain 09/22/2014  . Clinical depression 09/22/2014  . Acid reflux 09/22/2014  . HLD (hyperlipidemia) 09/22/2014  . BP (high blood pressure) 09/22/2014  . Iron deficiency 09/22/2014  . Disease characterized by destruction of skeletal muscle 09/22/2014  . Apnea, sleep 09/22/2014  . Umbilical hernia without obstruction and without gangrene 09/22/2014    Past Medical History  Diagnosis Date  . Headache     past hx, secondary to nerve damage   from accident  . Hypertension     CONTROLLED ON MEDS  . 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 Confusion caused by a drug ([facility])     fentanyl and oxycodone  . Arthritis     lower back  . GERD (gastroesophageal reflux disease)   . Sleep apnea     USES C-PAP    Social History   Social History  . Marital Status: Divorced    Spouse Name: single  . Number of Children: 0  . Years of Education: 12   Occupational History  . disability    Social History Main Topics  . Smoking status: Light Tobacco  Smoker -- 0.25 packs/day for 20 years    Types: Cigarettes  . Smokeless tobacco: Never Used     Comment: Had quit for 8 yrs. Father died 8/21. has had several cigarettes last few days.  . Alcohol Use: No  . Drug Use: No  . Sexual Activity: No   Other Topics Concern  . Not on file   Social History Narrative    Allergies  Allergen Reactions  . Codeine Itching and Nausea And Vomiting  . Opana [Oxymorphone] Swelling    Review of Systems  Constitutional: Positive for malaise/fatigue.  Respiratory: Negative.   Cardiovascular: Negative.   Gastrointestinal: Positive for nausea and diarrhea.  Musculoskeletal: Positive for back pain, joint pain and neck pain.  Endo/Heme/Allergies: Negative.   Psychiatric/Behavioral: The patient is nervous/anxious and has insomnia.     Immunization History  Administered Date(s) Administered  . Tdap 07/31/2008   Objective:  BP 128/70 mmHg  Pulse 68  Temp(Src) 98.8 F (37.1 C)  Resp 16  Wt 205 lb (92.987 kg)  Physical Exam  Constitutional: He is oriented to person, place, and time and well-developed, well-nourished, and in no distress.  HENT:  Head: Normocephalic and atraumatic.  Right Ear: External ear normal.  Left Ear: External ear normal.  Nose: Nose normal.  Eyes: Conjunctivae are normal.  Neck: Neck supple.  Cardiovascular: Normal rate, regular rhythm and normal heart sounds.   Pulmonary/Chest: Effort normal and breath sounds normal.  Abdominal: Soft.  Neurological: He is alert and oriented to person, place, and time.  Skin: Skin is warm and dry.  Psychiatric: Mood, memory, affect and judgment normal.    Lab Results  Component Value Date   WBC 9.2 09/18/2014   HGB 14.8 09/18/2014   HCT 43 09/18/2014   PLT 321 09/18/2014   GLUCOSE 112* 12/12/2011   CHOL 205* 03/21/2014   TRIG 201* 03/21/2014   HDL 35 03/21/2014   LDLCALC 130 03/21/2014   TSH 1.84 12/29/2012    CMP     Component Value Date/Time   NA 129* 03/21/2014     NA 135* 12/12/2011 1740   NA 136 11/13/2008 1418   K 3.8 03/21/2014   K 3.0* 12/12/2011 1740   CL 94* 12/12/2011 1740   CL 101 11/13/2008 1418   CO2 34* 12/12/2011 1740   CO2 27 11/13/2008 1418   GLUCOSE 112* 12/12/2011 1740   GLUCOSE 106* 11/13/2008 1418   BUN 6 03/21/2014   BUN 9 12/12/2011 1740   BUN 8 11/13/2008 1418   CREATININE 0.6 03/21/2014   CREATININE 1.09 12/12/2011 1740   CREATININE 1.00 11/13/2008 1418   CALCIUM 8.8 12/12/2011 1740   CALCIUM 9.9 11/13/2008 1418   PROT 6.9 12/12/2011 1740   ALBUMIN 3.3* 12/12/2011 1740   AST 21   03/21/2014   AST 83* 12/12/2011 1740   ALT 19 03/21/2014   ALT 73 12/12/2011 1740   ALKPHOS 40 03/21/2014   ALKPHOS 44* 12/12/2011 1740   BILITOT 0.3 12/12/2011 1740   GFRNONAA >60 12/12/2011 1740   GFRNONAA >60 11/13/2008 1418   GFRAA >60 12/12/2011 1740   GFRAA  11/13/2008 1418    >60        The eGFR has been calculated using the MDRD equation. This calculation has not been validated in all clinical situations. eGFR's persistently <60 mL/min signify possible Chronic Kidney Disease.    Assessment and Plan :  1. Essential hypertension  - CBC with Differential/Platelet - Comprehensive metabolic panel  2. Clinical depression   3. Chronic cervical pain   4. Back pain, chronic Per Dr Bodeo in Pain Clinic.  5. Anxiety  - TSH  6. Need for influenza vaccination  - Flu Vaccine QUAD 36+ mos IM  7. Colitis May need GI evaluation. - Sed Rate (ESR)  I have done the exam and reviewed the above chart and it is accurate to the best of my knowledge.  Richard Gilbert MD [facility] 02/20/2015 3:41 PM 

## 2015-02-21 LAB — CBC WITH DIFFERENTIAL/PLATELET
BASOS ABS: 0 10*3/uL (ref 0.0–0.2)
Basos: 0 %
EOS (ABSOLUTE): 0.2 10*3/uL (ref 0.0–0.4)
Eos: 2 %
HEMOGLOBIN: 16 g/dL (ref 12.6–17.7)
Hematocrit: 45.9 % (ref 37.5–51.0)
IMMATURE GRANS (ABS): 0.1 10*3/uL (ref 0.0–0.1)
IMMATURE GRANULOCYTES: 1 %
LYMPHS: 11 %
Lymphocytes Absolute: 1.2 10*3/uL (ref 0.7–3.1)
MCH: 32.3 pg (ref 26.6–33.0)
MCHC: 34.9 g/dL (ref 31.5–35.7)
MCV: 93 fL (ref 79–97)
MONOCYTES: 4 %
Monocytes Absolute: 0.4 10*3/uL (ref 0.1–0.9)
NEUTROS ABS: 8.7 10*3/uL — AB (ref 1.4–7.0)
NEUTROS PCT: 82 %
PLATELETS: 408 10*3/uL — AB (ref 150–379)
RBC: 4.96 x10E6/uL (ref 4.14–5.80)
RDW: 13.8 % (ref 12.3–15.4)
WBC: 10.5 10*3/uL (ref 3.4–10.8)

## 2015-02-21 LAB — COMPREHENSIVE METABOLIC PANEL
ALBUMIN: 4.4 g/dL (ref 3.5–5.5)
ALT: 19 IU/L (ref 0–44)
AST: 15 IU/L (ref 0–40)
Albumin/Globulin Ratio: 2 (ref 1.1–2.5)
Alkaline Phosphatase: 40 IU/L (ref 39–117)
BUN / CREAT RATIO: 16 (ref 9–20)
BUN: 11 mg/dL (ref 6–24)
Bilirubin Total: 0.3 mg/dL (ref 0.0–1.2)
CALCIUM: 10.2 mg/dL (ref 8.7–10.2)
CHLORIDE: 97 mmol/L (ref 97–106)
CO2: 26 mmol/L (ref 18–29)
CREATININE: 0.67 mg/dL — AB (ref 0.76–1.27)
GFR, EST AFRICAN AMERICAN: 123 mL/min/{1.73_m2} (ref >59)
GFR, EST NON AFRICAN AMERICAN: 107 mL/min/{1.73_m2} (ref >59)
GLUCOSE: 112 mg/dL — AB (ref 65–99)
Globulin, Total: 2.2 g/dL (ref 1.5–4.5)
Potassium: 5 mmol/L (ref 3.5–5.2)
Sodium: 141 mmol/L (ref 136–144)
TOTAL PROTEIN: 6.6 g/dL (ref 6.0–8.5)

## 2015-02-21 LAB — SEDIMENTATION RATE: Sed Rate: 2 mm/hr (ref 0–30)

## 2015-02-21 LAB — TSH: TSH: 0.756 u[IU]/mL (ref 0.450–4.500)

## 2015-02-21 NOTE — Progress Notes (Signed)
Advised  ED 

## 2015-02-26 ENCOUNTER — Ambulatory Visit: Payer: Self-pay | Admitting: Gastroenterology

## 2015-02-26 ENCOUNTER — Other Ambulatory Visit: Payer: Self-pay

## 2015-03-21 ENCOUNTER — Telehealth: Payer: Self-pay | Admitting: Family Medicine

## 2015-03-21 NOTE — Telephone Encounter (Signed)
Pt is returning call.  Pt is a Dr Rosanna Randy pt/MW

## 2015-03-21 NOTE — Telephone Encounter (Signed)
appt needed?-aa

## 2015-03-21 NOTE — Telephone Encounter (Signed)
No answer and mailbox is full, can not leave a message-aa

## 2015-03-21 NOTE — Telephone Encounter (Signed)
He would need to be seen for this. When necessary he has a chronic Phenergan order for nausea. May have to be Simona Huh or [place].

## 2015-03-21 NOTE — Telephone Encounter (Signed)
Pt advised and appt made for tomorrow.-aa

## 2015-03-21 NOTE — Telephone Encounter (Signed)
Pt states he has a fever, throwing up and also has sinus congestion.  Pt has a headache and feels weak.  Pt states this started Wednesday.  Pt is requesting a Rx to help with this if possible. Pt does not feel like he can come in.  [facility].  CB#[phone]/MW

## 2015-03-21 NOTE — Telephone Encounter (Signed)
Left message to call back for more info. Dr. Caryn Section, would patient need to come in or would you be willing to send something in? Please advise. Thanks!

## 2015-03-22 ENCOUNTER — Ambulatory Visit: Payer: Self-pay | Admitting: Family Medicine

## 2015-03-27 ENCOUNTER — Telehealth: Payer: Self-pay

## 2015-03-27 MED ORDER — METOPROLOL SUCCINATE ER 50 MG PO TB24: 50.0000 mg | ORAL_TABLET | Freq: Every day | ORAL | Status: DC

## 2015-03-27 MED ORDER — HYDROCHLOROTHIAZIDE 25 MG PO TABS: 25.0000 mg | ORAL_TABLET | Freq: Every day | ORAL | Status: DC

## 2015-03-27 NOTE — Telephone Encounter (Signed)
Pt advised and medication increased-aa

## 2015-03-27 NOTE — Telephone Encounter (Signed)
Patient states that his B/P has been running high 178 and 160 on the top and the bottom number 80-60, he has felt swimmy headed and can tell B/P running high. In October we switched his Ziac due to insurance to Metoprolol 25 mg and HTCZ 12.5 mg. Please review-aa

## 2015-03-27 NOTE — Telephone Encounter (Signed)
Increase HCTZ to 25 mg every morning and metoprolol to 50 mg daily.RTC 67month

## 2015-04-10 ENCOUNTER — Telehealth: Payer: Self-pay | Admitting: Family Medicine

## 2015-04-10 ENCOUNTER — Other Ambulatory Visit: Payer: Self-pay | Admitting: Physical Medicine and Rehabilitation

## 2015-04-10 DIAGNOSIS — M79602 Pain in left arm: Secondary | ICD-10-CM

## 2015-04-10 DIAGNOSIS — M5412 Radiculopathy, cervical region: Secondary | ICD-10-CM

## 2015-04-10 DIAGNOSIS — M542 Cervicalgia: Secondary | ICD-10-CM

## 2015-04-10 NOTE — Telephone Encounter (Signed)
Please review-aa 

## 2015-04-10 NOTE — Telephone Encounter (Signed)
Pt called wanting to know if you can increase the phenergan.  He is having more nausea and headaches.  He uses [facility].  Call back [phone]  Thanks Con Memos

## 2015-04-11 NOTE — Telephone Encounter (Signed)
Spoke with patient and he is taking Phenergan 25 mg mostly 2 tablets daily but sometimes 3 tablets daily. He states we only wrote his last RX for 30 tablets only and it does not last a month. Please review.  Also wanted to let us know he has MRI of the neck scheduled Monday with his pain medication.-aa

## 2015-04-11 NOTE — Telephone Encounter (Signed)
How often is he taking it now?

## 2015-04-13 NOTE — Telephone Encounter (Signed)
May go to every 6 hours when necessary, #120, 5 refills. Need to discuss this on next visit as this is too much of this medicine

## 2015-04-15 MED ORDER — PROMETHAZINE HCL 25 MG PO TABS: 25.0000 mg | ORAL_TABLET | Freq: Four times a day (QID) | ORAL | Status: DC | PRN

## 2015-04-15 NOTE — Telephone Encounter (Signed)
RX sent in-aa 

## 2015-04-22 ENCOUNTER — Ambulatory Visit
Admission: RE | Admit: 2015-04-22 | Discharge: 2015-04-22 | Disposition: A | Discharge status: Home / Self Care | Payer: Medicare Other | Source: Ambulatory Visit | Attending: Physical Medicine and Rehabilitation | Admitting: Physical Medicine and Rehabilitation

## 2015-04-22 DIAGNOSIS — M79602 Pain in left arm: Secondary | ICD-10-CM

## 2015-04-22 DIAGNOSIS — M5412 Radiculopathy, cervical region: Secondary | ICD-10-CM | POA: Diagnosis present

## 2015-04-22 DIAGNOSIS — Z9889 Other specified postprocedural states: Secondary | ICD-10-CM | POA: Diagnosis not present

## 2015-04-22 DIAGNOSIS — Z981 Arthrodesis status: Secondary | ICD-10-CM | POA: Insufficient documentation

## 2015-04-22 DIAGNOSIS — M47892 Other spondylosis, cervical region: Secondary | ICD-10-CM | POA: Insufficient documentation

## 2015-04-22 DIAGNOSIS — M542 Cervicalgia: Secondary | ICD-10-CM

## 2015-04-22 MED ORDER — GADOBENATE DIMEGLUMINE 529 MG/ML IV SOLN: 20.0000 mL | Freq: Once | INTRAVENOUS | Status: DC | PRN

## 2015-04-30 ENCOUNTER — Other Ambulatory Visit: Payer: Self-pay | Admitting: Family Medicine

## 2015-05-02 ENCOUNTER — Other Ambulatory Visit: Payer: Self-pay

## 2015-05-02 MED ORDER — LANSOPRAZOLE 30 MG PO CPDR: 30.0000 mg | DELAYED_RELEASE_CAPSULE | Freq: Two times a day (BID) | ORAL | Status: DC

## 2015-05-08 ENCOUNTER — Other Ambulatory Visit: Payer: Self-pay

## 2015-05-08 MED ORDER — ETODOLAC 500 MG PO TABS: 500.0000 mg | ORAL_TABLET | Freq: Two times a day (BID) | ORAL | Status: DC

## 2015-05-22 DIAGNOSIS — Z79891 Long term (current) use of opiate analgesic: Secondary | ICD-10-CM | POA: Diagnosis not present

## 2015-05-22 DIAGNOSIS — M47812 Spondylosis without myelopathy or radiculopathy, cervical region: Secondary | ICD-10-CM | POA: Diagnosis not present

## 2015-05-22 DIAGNOSIS — G894 Chronic pain syndrome: Secondary | ICD-10-CM | POA: Diagnosis not present

## 2015-05-22 DIAGNOSIS — M47817 Spondylosis without myelopathy or radiculopathy, lumbosacral region: Secondary | ICD-10-CM | POA: Diagnosis not present

## 2015-05-22 DIAGNOSIS — M961 Postlaminectomy syndrome, not elsewhere classified: Secondary | ICD-10-CM | POA: Diagnosis not present

## 2015-05-29 ENCOUNTER — Other Ambulatory Visit: Payer: Self-pay | Admitting: Family Medicine

## 2015-05-29 DIAGNOSIS — F419 Anxiety disorder, unspecified: Secondary | ICD-10-CM

## 2015-05-29 NOTE — Telephone Encounter (Signed)
Please review-aa 

## 2015-05-29 NOTE — Telephone Encounter (Signed)
Pt needs refill on ALPRAZolam Duanne Moron) 1 MG tablet  Dent  Call back is [phone]  Thanks Con Memos

## 2015-05-30 MED ORDER — ALPRAZOLAM 1 MG PO TABS: ORAL_TABLET | ORAL | Status: DC

## 2015-05-30 NOTE — Telephone Encounter (Signed)
May refill times 2.

## 2015-05-30 NOTE — Telephone Encounter (Signed)
RX called in and they will let patient know=aa

## 2015-06-25 DIAGNOSIS — M47817 Spondylosis without myelopathy or radiculopathy, lumbosacral region: Secondary | ICD-10-CM | POA: Diagnosis not present

## 2015-06-26 ENCOUNTER — Encounter: Payer: Medicare Other | Admitting: Family Medicine

## 2015-07-02 DIAGNOSIS — M47812 Spondylosis without myelopathy or radiculopathy, cervical region: Secondary | ICD-10-CM | POA: Diagnosis not present

## 2015-07-03 ENCOUNTER — Encounter: Payer: Medicare Other | Admitting: Family Medicine

## 2015-07-23 DIAGNOSIS — M961 Postlaminectomy syndrome, not elsewhere classified: Secondary | ICD-10-CM | POA: Diagnosis not present

## 2015-07-23 DIAGNOSIS — M47812 Spondylosis without myelopathy or radiculopathy, cervical region: Secondary | ICD-10-CM | POA: Diagnosis not present

## 2015-07-23 DIAGNOSIS — G894 Chronic pain syndrome: Secondary | ICD-10-CM | POA: Diagnosis not present

## 2015-07-23 DIAGNOSIS — M47817 Spondylosis without myelopathy or radiculopathy, lumbosacral region: Secondary | ICD-10-CM | POA: Diagnosis not present

## 2015-08-01 ENCOUNTER — Ambulatory Visit: Payer: Medicare Other | Admitting: Family Medicine

## 2015-08-07 ENCOUNTER — Ambulatory Visit: Payer: Medicare Other | Admitting: Family Medicine

## 2015-08-12 DIAGNOSIS — M47817 Spondylosis without myelopathy or radiculopathy, lumbosacral region: Secondary | ICD-10-CM | POA: Diagnosis not present

## 2015-08-20 ENCOUNTER — Ambulatory Visit (INDEPENDENT_AMBULATORY_CARE_PROVIDER_SITE_OTHER): Payer: PPO | Admitting: Family Medicine

## 2015-08-20 ENCOUNTER — Encounter: Payer: Self-pay | Admitting: Family Medicine

## 2015-08-20 VITALS — BP 130/72 | HR 84 | Temp 97.8°F | Resp 16 | Wt 205.0 lb

## 2015-08-20 DIAGNOSIS — I10 Essential (primary) hypertension: Secondary | ICD-10-CM

## 2015-08-20 DIAGNOSIS — M549 Dorsalgia, unspecified: Secondary | ICD-10-CM

## 2015-08-20 DIAGNOSIS — F419 Anxiety disorder, unspecified: Secondary | ICD-10-CM

## 2015-08-20 DIAGNOSIS — M5412 Radiculopathy, cervical region: Secondary | ICD-10-CM

## 2015-08-20 DIAGNOSIS — G8929 Other chronic pain: Secondary | ICD-10-CM | POA: Diagnosis not present

## 2015-08-20 DIAGNOSIS — E785 Hyperlipidemia, unspecified: Secondary | ICD-10-CM

## 2015-08-20 MED ORDER — ALPRAZOLAM 1 MG PO TABS: ORAL_TABLET | ORAL | Status: DC

## 2015-08-20 MED ORDER — VENLAFAXINE HCL 75 MG PO TABS: 75.0000 mg | ORAL_TABLET | Freq: Four times a day (QID) | ORAL | Status: DC

## 2015-08-20 NOTE — Patient Instructions (Addendum)
Try Glucosamine and Chondroitin over the counter for arthritis.
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
## 2015-08-23 DIAGNOSIS — M961 Postlaminectomy syndrome, not elsewhere classified: Secondary | ICD-10-CM | POA: Diagnosis not present

## 2015-08-23 DIAGNOSIS — M47812 Spondylosis without myelopathy or radiculopathy, cervical region: Secondary | ICD-10-CM | POA: Diagnosis not present

## 2015-08-23 DIAGNOSIS — G894 Chronic pain syndrome: Secondary | ICD-10-CM | POA: Diagnosis not present

## 2015-08-23 DIAGNOSIS — M47817 Spondylosis without myelopathy or radiculopathy, lumbosacral region: Secondary | ICD-10-CM | POA: Diagnosis not present

## 2015-08-23 DIAGNOSIS — M5481 Occipital neuralgia: Secondary | ICD-10-CM | POA: Diagnosis not present

## 2015-08-29 ENCOUNTER — Other Ambulatory Visit: Payer: Self-pay | Admitting: Family Medicine

## 2015-08-30 DIAGNOSIS — M542 Cervicalgia: Secondary | ICD-10-CM | POA: Diagnosis not present

## 2015-09-20 DIAGNOSIS — M47812 Spondylosis without myelopathy or radiculopathy, cervical region: Secondary | ICD-10-CM | POA: Diagnosis not present

## 2015-09-20 DIAGNOSIS — G894 Chronic pain syndrome: Secondary | ICD-10-CM | POA: Diagnosis not present

## 2015-09-20 DIAGNOSIS — M961 Postlaminectomy syndrome, not elsewhere classified: Secondary | ICD-10-CM | POA: Diagnosis not present

## 2015-09-20 DIAGNOSIS — M47817 Spondylosis without myelopathy or radiculopathy, lumbosacral region: Secondary | ICD-10-CM | POA: Diagnosis not present

## 2015-10-18 ENCOUNTER — Emergency Department
Admission: EM | Admit: 2015-10-18 | Discharge: 2015-10-18 | Disposition: A | Discharge status: Home / Self Care | Payer: PPO | Attending: Emergency Medicine | Admitting: Emergency Medicine

## 2015-10-18 ENCOUNTER — Emergency Department: Payer: PPO

## 2015-10-18 ENCOUNTER — Encounter: Payer: Self-pay | Admitting: Emergency Medicine

## 2015-10-18 DIAGNOSIS — Z79891 Long term (current) use of opiate analgesic: Secondary | ICD-10-CM | POA: Insufficient documentation

## 2015-10-18 DIAGNOSIS — G894 Chronic pain syndrome: Secondary | ICD-10-CM | POA: Diagnosis not present

## 2015-10-18 DIAGNOSIS — E785 Hyperlipidemia, unspecified: Secondary | ICD-10-CM | POA: Insufficient documentation

## 2015-10-18 DIAGNOSIS — I10 Essential (primary) hypertension: Secondary | ICD-10-CM | POA: Diagnosis not present

## 2015-10-18 DIAGNOSIS — F17210 Nicotine dependence, cigarettes, uncomplicated: Secondary | ICD-10-CM | POA: Diagnosis not present

## 2015-10-18 DIAGNOSIS — M961 Postlaminectomy syndrome, not elsewhere classified: Secondary | ICD-10-CM | POA: Diagnosis not present

## 2015-10-18 DIAGNOSIS — M47817 Spondylosis without myelopathy or radiculopathy, lumbosacral region: Secondary | ICD-10-CM | POA: Diagnosis not present

## 2015-10-18 DIAGNOSIS — Z79899 Other long term (current) drug therapy: Secondary | ICD-10-CM | POA: Insufficient documentation

## 2015-10-18 DIAGNOSIS — M1712 Unilateral primary osteoarthritis, left knee: Secondary | ICD-10-CM

## 2015-10-18 DIAGNOSIS — Z791 Long term (current) use of non-steroidal anti-inflammatories (NSAID): Secondary | ICD-10-CM | POA: Diagnosis not present

## 2015-10-18 DIAGNOSIS — F329 Major depressive disorder, single episode, unspecified: Secondary | ICD-10-CM | POA: Diagnosis not present

## 2015-10-18 DIAGNOSIS — M47812 Spondylosis without myelopathy or radiculopathy, cervical region: Secondary | ICD-10-CM | POA: Diagnosis not present

## 2015-10-18 DIAGNOSIS — M79605 Pain in left leg: Secondary | ICD-10-CM | POA: Diagnosis not present
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
## 2015-10-18 NOTE — Discharge Instructions (Signed)
Advised to restart anti-inflammatory medication to reduce inflammation left knee and foot and foot

## 2015-10-18 NOTE — ED Notes (Signed)
Pt verbalized understanding of discharge instructions. NAD at this time. 

## 2015-11-07 DIAGNOSIS — S2231XA Fracture of one rib, right side, initial encounter for closed fracture: Secondary | ICD-10-CM | POA: Diagnosis not present

## 2015-11-07 DIAGNOSIS — R51 Headache: Secondary | ICD-10-CM | POA: Diagnosis not present

## 2015-11-07 DIAGNOSIS — S8012XA Contusion of left lower leg, initial encounter: Secondary | ICD-10-CM | POA: Diagnosis not present

## 2015-11-07 DIAGNOSIS — M79662 Pain in left lower leg: Secondary | ICD-10-CM | POA: Diagnosis not present

## 2015-11-07 DIAGNOSIS — I10 Essential (primary) hypertension: Secondary | ICD-10-CM | POA: Diagnosis not present

## 2015-11-07 DIAGNOSIS — J320 Chronic maxillary sinusitis: Secondary | ICD-10-CM | POA: Diagnosis not present

## 2015-11-07 DIAGNOSIS — S0081XA Abrasion of other part of head, initial encounter: Secondary | ICD-10-CM | POA: Diagnosis not present

## 2015-11-07 DIAGNOSIS — M542 Cervicalgia: Secondary | ICD-10-CM | POA: Diagnosis not present

## 2015-11-07 DIAGNOSIS — R410 Disorientation, unspecified: Secondary | ICD-10-CM | POA: Diagnosis not present

## 2015-11-07 DIAGNOSIS — M898X8 Other specified disorders of bone, other site: Secondary | ICD-10-CM | POA: Diagnosis not present

## 2015-11-07 DIAGNOSIS — S0990XA Unspecified injury of head, initial encounter: Secondary | ICD-10-CM | POA: Diagnosis not present

## 2015-11-07 DIAGNOSIS — W108XXA Fall (on) (from) other stairs and steps, initial encounter: Secondary | ICD-10-CM | POA: Diagnosis not present

## 2015-11-07 DIAGNOSIS — R930 Abnormal findings on diagnostic imaging of skull and head, not elsewhere classified: Secondary | ICD-10-CM | POA: Diagnosis not present

## 2015-11-07 DIAGNOSIS — F17210 Nicotine dependence, cigarettes, uncomplicated: Secondary | ICD-10-CM | POA: Diagnosis not present

## 2015-11-07 DIAGNOSIS — M79661 Pain in right lower leg: Secondary | ICD-10-CM | POA: Diagnosis not present

## 2015-11-07 DIAGNOSIS — I6523 Occlusion and stenosis of bilateral carotid arteries: Secondary | ICD-10-CM | POA: Diagnosis not present

## 2015-11-07 DIAGNOSIS — S8010XA Contusion of unspecified lower leg, initial encounter: Secondary | ICD-10-CM | POA: Diagnosis not present

## 2015-11-08 ENCOUNTER — Telehealth: Payer: Self-pay | Admitting: Family Medicine

## 2015-11-08 DIAGNOSIS — R930 Abnormal findings on diagnostic imaging of skull and head, not elsewhere classified: Secondary | ICD-10-CM | POA: Diagnosis not present

## 2015-11-08 DIAGNOSIS — S0990XA Unspecified injury of head, initial encounter: Secondary | ICD-10-CM | POA: Diagnosis not present
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
## 2015-11-11 ENCOUNTER — Ambulatory Visit (INDEPENDENT_AMBULATORY_CARE_PROVIDER_SITE_OTHER): Payer: PPO | Admitting: Family Medicine

## 2015-11-11 ENCOUNTER — Encounter: Payer: Self-pay | Admitting: Family Medicine

## 2015-11-11 VITALS — BP 132/78 | HR 72 | Temp 99.0°F | Resp 18 | Wt 199.0 lb

## 2015-11-11 DIAGNOSIS — G8929 Other chronic pain: Secondary | ICD-10-CM | POA: Diagnosis not present

## 2015-11-11 DIAGNOSIS — M549 Dorsalgia, unspecified: Secondary | ICD-10-CM | POA: Diagnosis not present

## 2015-11-11 DIAGNOSIS — G4489 Other headache syndrome: Secondary | ICD-10-CM

## 2015-11-11 DIAGNOSIS — Z09 Encounter for follow-up examination after completed treatment for conditions other than malignant neoplasm: Secondary | ICD-10-CM | POA: Diagnosis not present

## 2015-11-11 DIAGNOSIS — R930 Abnormal findings on diagnostic imaging of skull and head, not elsewhere classified: Secondary | ICD-10-CM | POA: Diagnosis not present

## 2015-11-11 DIAGNOSIS — N400 Enlarged prostate without lower urinary tract symptoms: Secondary | ICD-10-CM | POA: Insufficient documentation

## 2015-11-11 DIAGNOSIS — S2239XA Fracture of one rib, unspecified side, initial encounter for closed fracture: Secondary | ICD-10-CM | POA: Diagnosis not present

## 2015-11-11 DIAGNOSIS — R739 Hyperglycemia, unspecified: Secondary | ICD-10-CM

## 2015-11-11 DIAGNOSIS — I10 Essential (primary) hypertension: Secondary | ICD-10-CM

## 2015-11-11 DIAGNOSIS — E785 Hyperlipidemia, unspecified: Secondary | ICD-10-CM | POA: Diagnosis not present

## 2015-11-11 MED ORDER — KETOROLAC TROMETHAMINE 60 MG/2ML IM SOLN
60.0000 mg | Freq: Once | INTRAMUSCULAR | Status: AC
  Administered 2015-11-11: 60 mg via INTRAMUSCULAR

## 2015-11-11 NOTE — Progress Notes (Signed)
Subjective:  HPI  Patient is here for ER follow up.  On July 4th patient went outside of his home to smoke, he sat down in a chair and it collapsed causing patient to fall forward and fall down a few steps-his chin is bruised, he hit his head, his legs have abrasions, after the fall he did loss conscious for about 30 minutes he thinks. Daughter wanted to take patient to the hospital and he finally agreed on July 6th and went to [facility]. They did chest xray-showed broken ribs, CT scan of the head-some calcifications were shown and then they did MRI but could not do that there and he was transport to [facility] was told per results they thought this was multiple myeloma vs meningioma. His symptoms after the fall are headache, blurred vision, dizziness, severe pain.  Prior to Admission medications   Medication Sig Start Date End Date Taking? Authorizing Provider  ALPRAZolam Duanne Moron) 1 MG tablet Take 1 tablet three times daily as needed 08/20/15   Jerrol Banana., MD  Ascorbic Acid (VITAMIN C PO) Take by mouth. am    Historical Provider, MD  B Complex Vitamins (VITAMIN B COMPLEX PO) Take by mouth. am    Historical Provider, MD  cyclobenzaprine (FLEXERIL) 5 MG tablet Take 5 mg by mouth 3 (three) times daily as needed.  07/16/11   Historical Provider, MD  etodolac (LODINE) 500 MG tablet Take 1 tablet (500 mg total) by mouth 2 (two) times daily. 05/08/15   Jaegar Croft Maceo Pro., MD  fenofibrate (TRICOR) 145 MG tablet TAKE ONE (1) TABLET EACH DAY 08/29/15   Mykell Genao Maceo Pro., MD  fentaNYL ([place] - DOSED MCG/HR) 50 MCG/HR Place onto the skin.  12/15/11   Historical Provider, MD  furosemide (LASIX) 40 MG tablet TAKE ONE TO TWO TABLETS IN THE MORNING AS NEEDED FOR SWELLING 10/19/14   Rhylee Nunn Maceo Pro., MD  hydrochlorothiazide (HYDRODIURIL) 25 MG tablet Take 1 tablet (25 mg total) by mouth daily. 03/27/15   Shalise Rosado Maceo Pro., MD  IRON PO Take by mouth daily. am     Historical Provider, MD  lansoprazole (PREVACID) 30 MG capsule Take 1 capsule (30 mg total) by mouth 2 (two) times daily before a meal. As needed 05/02/15   Jerrol Banana., MD  metoprolol succinate (TOPROL-XL) 50 MG 24 hr tablet Take 1 tablet (50 mg total) by mouth daily. 03/27/15   Azaylia Fong Maceo Pro., MD  Multiple Vitamin (MULTIVITAMIN) capsule Take 1 capsule by mouth daily.    Historical Provider, MD  oxyCODONE-acetaminophen (PERCOCET) 10-325 MG per tablet Take 1 tablet by mouth every 4 (four) hours as needed. 11/13/14   Historical Provider, MD  promethazine (PHENERGAN) 25 MG tablet Take 1 tablet (25 mg total) by mouth every 6 (six) hours as needed. 04/15/15   Zacary Bauer Maceo Pro., MD  venlafaxine [facility]) 75 MG tablet Take 1 tablet (75 mg total) by mouth QID. 08/20/15   Jerrol Banana., MD    Patient Active Problem List   Diagnosis Date Noted  . Personal history of colonic polyps   . Disease of colon   . Benign neoplasm of descending colon   . Benign neoplasm of sigmoid colon   . Rhabdomyolysis 11/20/2014  . Absolute anemia 09/22/2014  . Anxiety 09/22/2014  . 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 Back pain, chronic 09/22/2014  . Chronic cervical pain 09/22/2014  . Clinical depression 09/22/2014  .  Acid reflux 09/22/2014  . HLD (hyperlipidemia) 09/22/2014  . BP (high blood pressure) 09/22/2014  . Iron deficiency 09/22/2014  . Disease characterized by destruction of skeletal muscle 09/22/2014  . Apnea, sleep 09/22/2014  . Umbilical hernia without obstruction and without gangrene 09/22/2014    Past Medical History  Diagnosis Date  . Headache     past hx, secondary to nerve damage from accident  . Hypertension     CONTROLLED ON MEDS  . 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 Confusion caused by a drug (HCC)     fentanyl and oxycodone  . Arthritis     lower back  . GERD (gastroesophageal reflux disease)   .  Sleep apnea     USES C-PAP    Social History   Social History  . Marital Status: Divorced    Spouse Name: single  . Number of Children: 0  . Years of Education: 12   Occupational History  . disability    Social History Main Topics  . Smoking status: Light Tobacco Smoker -- 0.25 packs/day for 20 years    Types: Cigarettes  . Smokeless tobacco: Never Used     Comment: Had quit for 8 yrs. Father died Dec 28, 2022. has had several cigarettes last few days.  . Alcohol Use: No  . Drug Use: No  . Sexual Activity: No   Other Topics Concern  . Not on file   Social History Narrative    Allergies  Allergen Reactions  . Codeine Itching and Nausea And Vomiting  . Opana [Oxymorphone] Swelling    Review of Systems  Constitutional: Positive for malaise/fatigue.  Eyes: Positive for blurred vision.  Respiratory: Negative.   Cardiovascular: Negative.   Gastrointestinal: Negative.   Musculoskeletal: Positive for myalgias, back pain, joint pain and neck pain.  Neurological: Positive for dizziness and headaches.  Endo/Heme/Allergies: Negative.   Psychiatric/Behavioral: Positive for depression.    Immunization History  Administered Date(s) Administered  . Influenza,inj,Quad PF,36+ Mos 02/20/2015  . Tdap 07/31/2008   Objective:  BP 132/78 mmHg  Pulse 72  Temp(Src) 99 F (37.2 C)  Resp 18  Wt 199 lb (90.266 kg)  Physical Exam  Constitutional: He is oriented to person, place, and time and well-developed, well-nourished, and in no distress.  HENT:  Head: Normocephalic and atraumatic.  Right Ear: External ear normal.  Left Ear: External ear normal.  Nose: Nose normal.  Mouth/Throat: Oropharynx is clear and moist.  Contusion of the left side of the chin, tender over the left occipital lobe and over C1 and C2.  Eyes: Conjunctivae are normal. Pupils are equal, round, and reactive to light.  Neck:  Mild decreased range of motion in cervical spine due to discomfort. No tenderness over  the vertebral bodies.  Cardiovascular: Normal rate, regular rhythm, normal heart sounds and intact distal pulses.   No murmur heard. Pulmonary/Chest: Effort normal and breath sounds normal. No respiratory distress. He has no wheezes.  Abdominal: Soft.  Neurological: He is alert and oriented to person, place, and time. No cranial nerve deficit. He exhibits normal muscle tone. Coordination normal.  Skin: Skin is warm and dry.  Psychiatric: Affect normal.    Lab Results  Component Value Date   WBC 10.5 02/20/2015   HGB 14.8 09/18/2014   HCT 45.9 02/20/2015   PLT 408* 02/20/2015   GLUCOSE 112* 02/20/2015   CHOL 205* 03/21/2014   TRIG 201* 03/21/2014   HDL  35 03/21/2014   LDLCALC 130 03/21/2014   TSH 0.756 02/20/2015    CMP     Component Value Date/Time   NA 141 02/20/2015 1624   NA 135* 12/12/2011 1740   NA 136 11/13/2008 1418   K 5.0 02/20/2015 1624   K 3.0* 12/12/2011 1740   CL 97 02/20/2015 1624   CL 94* 12/12/2011 1740   CO2 26 02/20/2015 1624   CO2 34* 12/12/2011 1740   GLUCOSE 112* 02/20/2015 1624   GLUCOSE 112* 12/12/2011 1740   GLUCOSE 106* 11/13/2008 1418   BUN 11 02/20/2015 1624   BUN 9 12/12/2011 1740   BUN 8 11/13/2008 1418   CREATININE 0.67* 02/20/2015 1624   CREATININE 0.6 03/21/2014   CREATININE 1.09 12/12/2011 1740   CALCIUM 10.2 02/20/2015 1624   CALCIUM 8.8 12/12/2011 1740   PROT 6.6 02/20/2015 1624   PROT 6.9 12/12/2011 1740   ALBUMIN 4.4 02/20/2015 1624   ALBUMIN 3.3* 12/12/2011 1740   AST 15 02/20/2015 1624   AST 83* 12/12/2011 1740   ALT 19 02/20/2015 1624   ALT 73 12/12/2011 1740   ALKPHOS 40 02/20/2015 1624   ALKPHOS 44* 12/12/2011 1740   BILITOT 0.3 02/20/2015 1624   BILITOT 0.3 12/12/2011 1740   GFRNONAA 107 02/20/2015 1624   GFRNONAA >60 12/12/2011 1740   GFRAA 123 02/20/2015 1624   GFRAA >60 12/12/2011 1740    Assessment and Plan :  1. Hospital discharge follow-up Do not have records available to review. Check routine labs  today. 2. Back pain, chronic Followed at pain clinic in [place] 3. Fractured rib, unspecified laterality, closed, initial encounter Advised patient this will take a few weeks to heal. Administered Toradol injection today to help with pain.  Pain from fall has improved at the end of visit after a Toradol injection. 4. Other headache syndrome After the fall. Postconcussive headache. I think this will improve. 5. Abnormal imaging of head with possibility of multiple myeloma SPEP drawn recur consider referral to hematology. More than 50% visit spent in counseling. 5. Abnormal MRI of head Multiple myeloma vs meningioma. Will get labs, pending results to confirm diagnoses.  Patient was seen and examined by Dr. Eulas Post and note was scribed by Theressa Millard, RMA.    Miguel Aschoff MD [facility] 11/11/2015 2:33 PM

## 2015-11-11 NOTE — Telephone Encounter (Signed)
[email or web address] 

## 2015-11-13 LAB — COMPREHENSIVE METABOLIC PANEL
ALK PHOS: 50 IU/L (ref 39–117)
ALT: 16 IU/L (ref 0–44)
AST: 22 IU/L (ref 0–40)
Albumin/Globulin Ratio: 1.9 (ref 1.2–2.2)
Albumin: 4.3 g/dL (ref 3.5–5.5)
BUN/Creatinine Ratio: 13 (ref 9–20)
BUN: 9 mg/dL (ref 6–24)
Bilirubin Total: <0.2 mg/dL (ref 0.0–1.2)
CALCIUM: 9.6 mg/dL (ref 8.7–10.2)
CO2: 30 mmol/L — AB (ref 18–29)
CREATININE: 0.67 mg/dL — AB (ref 0.76–1.27)
Chloride: 92 mmol/L — ABNORMAL LOW (ref 96–106)
GFR calc Af Amer: 122 mL/min/{1.73_m2} (ref >59)
GFR, EST NON AFRICAN AMERICAN: 106 mL/min/{1.73_m2} (ref >59)
GLUCOSE: 92 mg/dL (ref 65–99)
Globulin, Total: 2.3 g/dL (ref 1.5–4.5)
Potassium: 3.5 mmol/L (ref 3.5–5.2)
Sodium: 139 mmol/L (ref 134–144)
Total Protein: 6.6 g/dL (ref 6.0–8.5)

## 2015-11-13 LAB — PROTEIN ELECTROPHORESIS, SERUM
A/G Ratio: 1.4 (ref 0.7–1.7)
ALBUMIN ELP: 3.8 g/dL (ref 2.9–4.4)
Alpha 1: 0.3 g/dL (ref 0.0–0.4)
Alpha 2: 0.8 g/dL (ref 0.4–1.0)
Beta: 1 g/dL (ref 0.7–1.3)
Gamma Globulin: 0.7 g/dL (ref 0.4–1.8)
Globulin, Total: 2.8 g/dL (ref 2.2–3.9)

## 2015-11-13 LAB — CBC WITH DIFFERENTIAL/PLATELET
BASOS ABS: 0 10*3/uL (ref 0.0–0.2)
BASOS: 0 %
EOS (ABSOLUTE): 0.3 10*3/uL (ref 0.0–0.4)
EOS: 3 %
Hematocrit: 44 % (ref 37.5–51.0)
Hemoglobin: 14.3 g/dL (ref 12.6–17.7)
IMMATURE GRANS (ABS): 0 10*3/uL (ref 0.0–0.1)
IMMATURE GRANULOCYTES: 0 %
LYMPHS ABS: 2.3 10*3/uL (ref 0.7–3.1)
LYMPHS: 24 %
MCH: 30 pg (ref 26.6–33.0)
MCHC: 32.5 g/dL (ref 31.5–35.7)
MCV: 92 fL (ref 79–97)
MONOCYTES: 9 %
Monocytes Absolute: 0.9 10*3/uL (ref 0.1–0.9)
NEUTROS ABS: 5.8 10*3/uL (ref 1.4–7.0)
Neutrophils: 64 %
PLATELETS: 385 10*3/uL — AB (ref 150–379)
RBC: 4.76 x10E6/uL (ref 4.14–5.80)
RDW: 13.5 % (ref 12.3–15.4)
WBC: 9.3 10*3/uL (ref 3.4–10.8)

## 2015-11-13 LAB — LIPID PANEL WITH LDL/HDL RATIO
CHOLESTEROL TOTAL: 242 mg/dL — AB (ref 100–199)
HDL: 33 mg/dL — ABNORMAL LOW (ref >39)
LDL Calculated: 169 mg/dL — ABNORMAL HIGH (ref 0–99)
LDl/HDL Ratio: 5.1 ratio units — ABNORMAL HIGH (ref 0.0–3.6)
Triglycerides: 198 mg/dL — ABNORMAL HIGH (ref 0–149)
VLDL CHOLESTEROL CAL: 40 mg/dL (ref 5–40)

## 2015-11-13 LAB — TSH: TSH: 0.757 u[IU]/mL (ref 0.450–4.500)

## 2015-11-13 LAB — HEMOGLOBIN A1C
ESTIMATED AVERAGE GLUCOSE: 108 mg/dL
HEMOGLOBIN A1C: 5.4 % (ref 4.8–5.6)

## 2015-11-13 LAB — PSA: PROSTATE SPECIFIC AG, SERUM: 0.9 ng/mL (ref 0.0–4.0)

## 2015-11-14 NOTE — Progress Notes (Signed)
Advised  ED 

## 2015-11-18 ENCOUNTER — Other Ambulatory Visit: Payer: Self-pay

## 2015-11-18 DIAGNOSIS — F419 Anxiety disorder, unspecified: Secondary | ICD-10-CM

## 2015-11-18 MED ORDER — ALPRAZOLAM 1 MG PO TABS: ORAL_TABLET | ORAL | Status: DC

## 2015-11-18 NOTE — Telephone Encounter (Signed)
Refill request from [facility] for Xanax, last fill 08/20/15-aa

## 2015-11-19 ENCOUNTER — Ambulatory Visit (INDEPENDENT_AMBULATORY_CARE_PROVIDER_SITE_OTHER): Payer: PPO | Admitting: Family Medicine

## 2015-11-19 VITALS — BP 122/74 | HR 74 | Temp 98.1°F | Resp 18 | Wt 200.0 lb

## 2015-11-19 DIAGNOSIS — M549 Dorsalgia, unspecified: Secondary | ICD-10-CM

## 2015-11-19 DIAGNOSIS — G4489 Other headache syndrome: Secondary | ICD-10-CM

## 2015-11-19 DIAGNOSIS — F329 Major depressive disorder, single episode, unspecified: Secondary | ICD-10-CM

## 2015-11-19 DIAGNOSIS — S2239XA Fracture of one rib, unspecified side, initial encounter for closed fracture: Secondary | ICD-10-CM

## 2015-11-19 DIAGNOSIS — R930 Abnormal findings on diagnostic imaging of skull and head, not elsewhere classified: Secondary | ICD-10-CM | POA: Diagnosis not present

## 2015-11-19 DIAGNOSIS — F32A Depression, unspecified: Secondary | ICD-10-CM

## 2015-11-19 DIAGNOSIS — G8929 Other chronic pain: Secondary | ICD-10-CM | POA: Diagnosis not present

## 2015-11-19 MED ORDER — KETOROLAC TROMETHAMINE 60 MG/2ML IM SOLN
60.0000 mg | Freq: Once | INTRAMUSCULAR | Status: AC
  Administered 2015-11-19: 60 mg via INTRAMUSCULAR

## 2015-11-19 NOTE — Progress Notes (Signed)
Subjective:  HPI  Patient is here for follow up after his last visit on July 10th. Labs at that time were checked are routine plus Protein Electrophoresis-labs were ok.  His ribs still hurt with a little improvement, he does wrap his ribs, painful to ride in the car even short distance. He is still having a headache-constant pain and he is having aura with it.  Prior to Admission medications   Medication Sig Start Date End Date Taking? Authorizing Provider  ALPRAZolam Duanne Moron) 1 MG tablet Take 1 tablet three times daily as needed 11/18/15   Jerrol Banana., MD  Ascorbic Acid (VITAMIN C PO) Take by mouth. am    Historical Provider, MD  B Complex Vitamins (VITAMIN B COMPLEX PO) Take by mouth. am    Historical Provider, MD  cyclobenzaprine (FLEXERIL) 5 MG tablet Take 5 mg by mouth 3 (three) times daily as needed.  07/16/11   Historical Provider, MD  etodolac (LODINE) 500 MG tablet Take 1 tablet (500 mg total) by mouth 2 (two) times daily. 05/08/15   Richard Maceo Pro., MD  fenofibrate (TRICOR) 145 MG tablet TAKE ONE (1) TABLET EACH DAY 08/29/15   Richard Maceo Pro., MD  fentaNYL ([place] - DOSED MCG/HR) 50 MCG/HR Place onto the skin.  12/15/11   Historical Provider, MD  furosemide (LASIX) 40 MG tablet TAKE ONE TO TWO TABLETS IN THE MORNING AS NEEDED FOR SWELLING 10/19/14   Richard Maceo Pro., MD  hydrochlorothiazide (HYDRODIURIL) 25 MG tablet Take 1 tablet (25 mg total) by mouth daily. 03/27/15   Richard Maceo Pro., MD  IRON PO Take by mouth daily. am    Historical Provider, MD  lansoprazole (PREVACID) 30 MG capsule Take 1 capsule (30 mg total) by mouth 2 (two) times daily before a meal. As needed 05/02/15   Jerrol Banana., MD  metoprolol succinate (TOPROL-XL) 50 MG 24 hr tablet Take 1 tablet (50 mg total) by mouth daily. 03/27/15   Richard Maceo Pro., MD  Multiple Vitamin (MULTIVITAMIN) capsule Take 1 capsule by mouth daily.    Historical Provider, MD    oxyCODONE-acetaminophen (PERCOCET) 10-325 MG per tablet Take 1 tablet by mouth every 4 (four) hours as needed. 11/13/14   Historical Provider, MD  promethazine (PHENERGAN) 25 MG tablet Take 1 tablet (25 mg total) by mouth every 6 (six) hours as needed. 04/15/15   Richard Maceo Pro., MD  venlafaxine [facility]) 75 MG tablet Take 1 tablet (75 mg total) by mouth QID. 08/20/15   Jerrol Banana., MD    Patient Active Problem List   Diagnosis Date Noted  . BPH (benign prostatic hypertrophy) 11/11/2015  . Personal history of colonic polyps   . Disease of colon   . Benign neoplasm of descending colon   . Benign neoplasm of sigmoid colon   . Rhabdomyolysis 11/20/2014  . Absolute anemia 09/22/2014  . Anxiety 09/22/2014  . 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 Back pain, chronic 09/22/2014  . Chronic cervical pain 09/22/2014  . Clinical depression 09/22/2014  . Acid reflux 09/22/2014  . HLD (hyperlipidemia) 09/22/2014  . BP (high blood pressure) 09/22/2014  . Iron deficiency 09/22/2014  . Disease characterized by destruction of skeletal muscle 09/22/2014  . Apnea, sleep 09/22/2014  . Umbilical hernia without obstruction and without gangrene 09/22/2014    Past Medical History  Diagnosis Date  . Headache     past hx, secondary to nerve damage from  accident  . Hypertension     CONTROLLED ON MEDS  . 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 Confusion caused by a drug (HCC)     fentanyl and oxycodone  . Arthritis     lower back  . GERD (gastroesophageal reflux disease)   . Sleep apnea     USES C-PAP    Social History   Social History  . Marital Status: Divorced    Spouse Name: single  . Number of Children: 0  . Years of Education: 12   Occupational History  . disability    Social History Main Topics  . Smoking status: Light Tobacco Smoker -- 0.25 packs/day for 20 years    Types: Cigarettes  . Smokeless tobacco: Never  Used     Comment: Had quit for 8 yrs. Father died 12-25-22. has had several cigarettes last few days.  . Alcohol Use: No  . Drug Use: No  . Sexual Activity: No   Other Topics Concern  . Not on file   Social History Narrative    Allergies  Allergen Reactions  . Codeine Itching and Nausea And Vomiting  . Opana [Oxymorphone] Swelling    Review of Systems  Constitutional: Positive for malaise/fatigue.  Eyes: Negative.   Respiratory: Negative.   Cardiovascular: Negative.   Musculoskeletal: Positive for myalgias, back pain, joint pain and neck pain.  Skin: Negative.   Neurological: Positive for dizziness (if he gets up too fast.) and headaches.  Psychiatric/Behavioral: Negative.     Immunization History  Administered Date(s) Administered  . Influenza,inj,Quad PF,36+ Mos 02/20/2015  . Tdap 07/31/2008   Objective:  BP 122/74 mmHg  Pulse 74  Temp(Src) 98.1 F (36.7 C)  Resp 18  Wt 200 lb (90.719 kg)  SpO2 98%  Physical Exam  Constitutional: He is oriented to person, place, and time and well-developed, well-nourished, and in no distress.  HENT:  Head: Normocephalic and atraumatic.  Right Ear: External ear normal.  Left Ear: External ear normal.  Nose: Nose normal.  Eyes: Conjunctivae are normal. Pupils are equal, round, and reactive to light.  Neck: Normal range of motion. Neck supple.  Cardiovascular: Normal rate, regular rhythm, normal heart sounds and intact distal pulses.   No murmur heard. Pulmonary/Chest: Effort normal and breath sounds normal.  Musculoskeletal: He exhibits no tenderness.  Neurological: He is alert and oriented to person, place, and time.  Skin: Skin is warm and dry.  Psychiatric: Mood, memory, affect and judgment normal.    Lab Results  Component Value Date   WBC 9.3 11/11/2015   HGB 14.8 09/18/2014   HCT 44.0 11/11/2015   PLT 385* 11/11/2015   GLUCOSE 92 11/11/2015   CHOL 242* 11/11/2015   TRIG 198* 11/11/2015   HDL 33* 11/11/2015    LDLCALC 169* 11/11/2015   TSH 0.757 11/11/2015   HGBA1C 5.4 11/11/2015    CMP     Component Value Date/Time   NA 139 11/11/2015 1529   NA 135* 12/12/2011 1740   NA 136 11/13/2008 1418   K 3.5 11/11/2015 1529   K 3.0* 12/12/2011 1740   CL 92* 11/11/2015 1529   CL 94* 12/12/2011 1740   CO2 30* 11/11/2015 1529   CO2 34* 12/12/2011 1740   GLUCOSE 92 11/11/2015 1529   GLUCOSE 112* 12/12/2011 1740   GLUCOSE 106* 11/13/2008 1418   BUN 9 11/11/2015 1529   BUN 9 12/12/2011 1740   BUN 8  11/13/2008 1418   CREATININE 0.67* 11/11/2015 1529   CREATININE 0.6 03/21/2014   CREATININE 1.09 12/12/2011 1740   CALCIUM 9.6 11/11/2015 1529   CALCIUM 8.8 12/12/2011 1740   PROT 6.6 11/11/2015 1529   PROT 6.9 12/12/2011 1740   ALBUMIN 4.3 11/11/2015 1529   ALBUMIN 3.3* 12/12/2011 1740   AST 22 11/11/2015 1529   AST 83* 12/12/2011 1740   ALT 16 11/11/2015 1529   ALT 73 12/12/2011 1740   ALKPHOS 50 11/11/2015 1529   ALKPHOS 44* 12/12/2011 1740   BILITOT <0.2 11/11/2015 1529   BILITOT 0.3 12/12/2011 1740   GFRNONAA 106 11/11/2015 1529   GFRNONAA >60 12/12/2011 1740   GFRAA 122 11/11/2015 1529   GFRAA >60 12/12/2011 1740    Assessment and Plan :  1. Back pain, chronic Per pain clinic. - ketorolac (TORADOL) injection 60 mg; Inject 2 mLs (60 mg total) into the muscle once.  2. Fractured rib, unspecified laterality, closed, initial encounter From fall - ketorolac (TORADOL) injection 60 mg; Inject 2 mLs (60 mg total) into the muscle once.  3. Other headache syndrome Worsened by fall and head trauma.  4. Abnormal MRI of head Refer to hematology to make sure no possibility of multiple myeloma. - Ambulatory referral to Hematology  5. Clinical depression/GAD Stable.   I have done the exam and reviewed the above chart and it is accurate to the best of my knowledge.  Miguel Aschoff MD [facility] 11/19/2015 3:44 PM

## 2015-12-16 ENCOUNTER — Other Ambulatory Visit: Payer: Self-pay | Admitting: Family Medicine

## 2015-12-16 DIAGNOSIS — K219 Gastro-esophageal reflux disease without esophagitis: Secondary | ICD-10-CM

## 2015-12-18 ENCOUNTER — Telehealth: Payer: Self-pay | Admitting: Family Medicine

## 2015-12-18 MED ORDER — LANSOPRAZOLE 30 MG PO CPDR: 30.0000 mg | DELAYED_RELEASE_CAPSULE | Freq: Two times a day (BID) | ORAL | 11 refills | Status: DC
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
## 2015-12-18 NOTE — Telephone Encounter (Signed)
Pt needs refill on his generic prevacid.  He uses [facility]

## 2015-12-18 NOTE — Telephone Encounter (Signed)
Done-aa 

## 2015-12-19 ENCOUNTER — Encounter: Payer: Self-pay | Admitting: *Deleted

## 2015-12-19 ENCOUNTER — Inpatient Hospital Stay: Payer: PPO | Attending: Hematology and Oncology | Admitting: Hematology and Oncology

## 2015-12-19 ENCOUNTER — Encounter (INDEPENDENT_AMBULATORY_CARE_PROVIDER_SITE_OTHER): Payer: Self-pay

## 2015-12-19 ENCOUNTER — Encounter: Payer: Self-pay | Admitting: Hematology and Oncology

## 2015-12-19 ENCOUNTER — Inpatient Hospital Stay: Payer: PPO

## 2015-12-19 VITALS — BP 125/74 | HR 69 | Temp 96.9°F | Wt 198.0 lb

## 2015-12-19 DIAGNOSIS — G4730 Sleep apnea, unspecified: Secondary | ICD-10-CM | POA: Diagnosis not present

## 2015-12-19 DIAGNOSIS — Z79899 Other long term (current) drug therapy: Secondary | ICD-10-CM | POA: Diagnosis not present

## 2015-12-19 DIAGNOSIS — J320 Chronic maxillary sinusitis: Secondary | ICD-10-CM | POA: Insufficient documentation

## 2015-12-19 DIAGNOSIS — K219 Gastro-esophageal reflux disease without esophagitis: Secondary | ICD-10-CM | POA: Diagnosis not present

## 2015-12-19 DIAGNOSIS — M961 Postlaminectomy syndrome, not elsewhere classified: Secondary | ICD-10-CM | POA: Diagnosis not present

## 2015-12-19 DIAGNOSIS — M899 Disorder of bone, unspecified: Secondary | ICD-10-CM

## 2015-12-19 DIAGNOSIS — M1990 Unspecified osteoarthritis, unspecified site: Secondary | ICD-10-CM | POA: Insufficient documentation

## 2015-12-19 DIAGNOSIS — M47812 Spondylosis without myelopathy or radiculopathy, cervical region: Secondary | ICD-10-CM | POA: Diagnosis not present

## 2015-12-19 DIAGNOSIS — R630 Anorexia: Secondary | ICD-10-CM | POA: Diagnosis not present

## 2015-12-19 DIAGNOSIS — I10 Essential (primary) hypertension: Secondary | ICD-10-CM | POA: Diagnosis not present

## 2015-12-19 DIAGNOSIS — R51 Headache: Secondary | ICD-10-CM | POA: Diagnosis not present

## 2015-12-19 DIAGNOSIS — D801 Nonfamilial hypogammaglobulinemia: Secondary | ICD-10-CM | POA: Insufficient documentation

## 2015-12-19 DIAGNOSIS — F17210 Nicotine dependence, cigarettes, uncomplicated: Secondary | ICD-10-CM | POA: Diagnosis not present

## 2015-12-19 DIAGNOSIS — R634 Abnormal weight loss: Secondary | ICD-10-CM | POA: Insufficient documentation

## 2015-12-19 DIAGNOSIS — M47817 Spondylosis without myelopathy or radiculopathy, lumbosacral region: Secondary | ICD-10-CM | POA: Diagnosis not present

## 2015-12-19 DIAGNOSIS — G894 Chronic pain syndrome: Secondary | ICD-10-CM | POA: Diagnosis not present

## 2015-12-19 DIAGNOSIS — D1809 Hemangioma of other sites: Secondary | ICD-10-CM | POA: Insufficient documentation

## 2015-12-19 NOTE — Progress Notes (Signed)
Patient had concussion Sep 03, 2015 then fell again on November 06, 2015 is experiencing memory loss.

## 2015-12-20 LAB — BETA 2 MICROGLOBULIN, SERUM: Beta-2 Microglobulin: 1.2 mg/L (ref 0.6–2.4)

## 2015-12-20 LAB — KAPPA/LAMBDA LIGHT CHAINS
Kappa free light chain: 7 mg/L (ref 3.3–19.4)
Kappa, lambda light chain ratio: 1.06 (ref 0.26–1.65)
Lambda free light chains: 6.6 mg/L (ref 5.7–26.3)

## 2015-12-23 ENCOUNTER — Encounter: Payer: Self-pay | Admitting: Family Medicine

## 2015-12-24 LAB — MULTIPLE MYELOMA PANEL, SERUM
Albumin SerPl Elph-Mcnc: 3.7 g/dL (ref 2.9–4.4)
Albumin/Glob SerPl: 1.5 (ref 0.7–1.7)
Alpha 1: 0.2 g/dL (ref 0.0–0.4)
Alpha2 Glob SerPl Elph-Mcnc: 0.8 g/dL (ref 0.4–1.0)
B-Globulin SerPl Elph-Mcnc: 0.9 g/dL (ref 0.7–1.3)
Gamma Glob SerPl Elph-Mcnc: 0.6 g/dL (ref 0.4–1.8)
Globulin, Total: 2.6 g/dL (ref 2.2–3.9)
IgA: 84 mg/dL — ABNORMAL LOW (ref 90–386)
IgG (Immunoglobin G), Serum: 637 mg/dL — ABNORMAL LOW (ref 700–1600)
IgM, Serum: 16 mg/dL — ABNORMAL LOW (ref 20–172)
Total Protein ELP: 6.3 g/dL (ref 6.0–8.5)

## 2015-12-25 DIAGNOSIS — M899 Disorder of bone, unspecified: Secondary | ICD-10-CM | POA: Diagnosis not present

## 2015-12-26 ENCOUNTER — Other Ambulatory Visit: Payer: Self-pay

## 2015-12-26 DIAGNOSIS — M898X9 Other specified disorders of bone, unspecified site: Secondary | ICD-10-CM

## 2016-01-03 ENCOUNTER — Inpatient Hospital Stay: Payer: PPO | Attending: Hematology and Oncology | Admitting: Hematology and Oncology

## 2016-01-03 VITALS — BP 139/79 | HR 63 | Temp 96.1°F | Resp 18 | Wt 197.6 lb

## 2016-01-03 DIAGNOSIS — M1990 Unspecified osteoarthritis, unspecified site: Secondary | ICD-10-CM | POA: Insufficient documentation

## 2016-01-03 DIAGNOSIS — M899 Disorder of bone, unspecified: Secondary | ICD-10-CM | POA: Diagnosis not present

## 2016-01-03 DIAGNOSIS — G4730 Sleep apnea, unspecified: Secondary | ICD-10-CM | POA: Diagnosis not present

## 2016-01-03 DIAGNOSIS — K219 Gastro-esophageal reflux disease without esophagitis: Secondary | ICD-10-CM | POA: Insufficient documentation

## 2016-01-03 DIAGNOSIS — D1809 Hemangioma of other sites: Secondary | ICD-10-CM | POA: Diagnosis not present

## 2016-01-03 DIAGNOSIS — I10 Essential (primary) hypertension: Secondary | ICD-10-CM | POA: Insufficient documentation

## 2016-01-03 DIAGNOSIS — Z79899 Other long term (current) drug therapy: Secondary | ICD-10-CM | POA: Diagnosis not present

## 2016-01-03 DIAGNOSIS — D801 Nonfamilial hypogammaglobulinemia: Secondary | ICD-10-CM

## 2016-01-03 DIAGNOSIS — F17210 Nicotine dependence, cigarettes, uncomplicated: Secondary | ICD-10-CM

## 2016-01-03 DIAGNOSIS — Z9181 History of falling: Secondary | ICD-10-CM | POA: Diagnosis not present

## 2016-01-05 ENCOUNTER — Encounter: Payer: Self-pay | Admitting: Hematology and Oncology
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
## 2016-01-15 LAB — IFE+PROTEIN ELECTRO, 24-HR UR
% BETA, Urine: 22.3 %
ALPHA 1 URINE: 1 %
Albumin, U: 46.7 %
Alpha 2, Urine: 15 %
GAMMA GLOBULIN URINE: 15 %
Total Protein, Urine-Ur/day: <45 mg/24 hr (ref 30–150)
Total Protein, Urine: <4 mg/dL
Total Volume: 1120

## 2016-01-17 DIAGNOSIS — M47812 Spondylosis without myelopathy or radiculopathy, cervical region: Secondary | ICD-10-CM | POA: Diagnosis not present

## 2016-01-17 DIAGNOSIS — G894 Chronic pain syndrome: Secondary | ICD-10-CM | POA: Diagnosis not present

## 2016-01-17 DIAGNOSIS — M47817 Spondylosis without myelopathy or radiculopathy, lumbosacral region: Secondary | ICD-10-CM | POA: Diagnosis not present

## 2016-01-17 DIAGNOSIS — Z79891 Long term (current) use of opiate analgesic: Secondary | ICD-10-CM | POA: Diagnosis not present

## 2016-01-17 DIAGNOSIS — M961 Postlaminectomy syndrome, not elsewhere classified: Secondary | ICD-10-CM | POA: Diagnosis not present

## 2016-02-06 ENCOUNTER — Ambulatory Visit (INDEPENDENT_AMBULATORY_CARE_PROVIDER_SITE_OTHER): Payer: PPO | Admitting: Family Medicine

## 2016-02-06 VITALS — BP 124/78 | HR 66 | Temp 98.6°F | Resp 18 | Wt 180.0 lb

## 2016-02-06 DIAGNOSIS — S060X0S Concussion without loss of consciousness, sequela: Secondary | ICD-10-CM | POA: Diagnosis not present

## 2016-02-06 DIAGNOSIS — R519 Headache, unspecified: Secondary | ICD-10-CM

## 2016-02-06 DIAGNOSIS — R51 Headache: Secondary | ICD-10-CM

## 2016-02-06 DIAGNOSIS — J40 Bronchitis, not specified as acute or chronic: Secondary | ICD-10-CM

## 2016-02-06 MED ORDER — FISH OIL 1000 MG PO CAPS: 1.0000 | ORAL_CAPSULE | Freq: Every day | ORAL | 12 refills | Status: DC

## 2016-02-06 MED ORDER — DOXYCYCLINE HYCLATE 100 MG PO TABS: 100.0000 mg | ORAL_TABLET | Freq: Two times a day (BID) | ORAL | 0 refills | Status: DC

## 2016-02-06 MED ORDER — KETOROLAC TROMETHAMINE 60 MG/2ML IM SOLN
60.0000 mg | Freq: Once | INTRAMUSCULAR | Status: AC
  Administered 2016-02-06: 60 mg via INTRAMUSCULAR

## 2016-02-06 NOTE — Progress Notes (Signed)
Joshua Guerrero  MRN: [id number] DOB: 07/28/1955  Subjective:  HPI   The patient is a 60 year old male who presents for evaluation of burning in his chest with breathing, shortness of breath with exertion, non productive cough for 2 weeks, and follow up on concussion from a fall on 11/05/15.  The patient states that he has had a non productive cough and burning with breathing after he went into an old house.  He states that he and some other people were going through things in the house to sell and there was a lot of dust.  Everyone else that was in the house also got sick but they have since gotten better.  The patient states he was the one going through most of the items.  He also states that he gets short of breath when he does any activities since being in the house and getting sick.    The patient is also asking about a follow up on  a concussion he got in July after falling.  He states that it is getting worse and according to the patient his oncologist told him that right now it was more important to get treated for the concussion than it was for the cancer.  The patient has been being seen by Dr Mike Gip and was last seen by her on 01/03/16.  The patient states he is sleep walking at night and has been witnessed getting up and walking to the wall and banging his head on the wall.  On one event he was banging it on the hood to the stove.  He states he has a long history of sleep walking but it has gotten significantly worse since the fall.   The patientt states that the fall involved a chair collapsing causing him to fall forward and hitting his head on a concrete step landing on his chin. Since the fall he has had bad balance, lack of coordination, inability to focus, can't focus to pay bills and feels he doesn't understand what he is doing with them.  Short term memory loss since the fall.   At the time of the fall the patient did not seek attention until the next day when he went to the [facility] ED.   They x-rayed his legs but did not do anything else.  The next night he went to [facility] ED because of head pain and bruising.  He states he had scans, and afterward that he was told he had a concussion and they treated his pain with Fentanyl injection.  He was then sent to [facility] for an MRI.  The MRI revealed he had cancer per the patient.    Patient Active Problem List   Diagnosis Date Noted  . Hypogammaglobulinemia ([place]) 12/19/2015  . BPH (benign prostatic hypertrophy) 11/11/2015  . Personal history of colonic polyps   . Disease of colon   . Benign neoplasm of descending colon   . Benign neoplasm of sigmoid colon   . Rhabdomyolysis 11/20/2014  . Absolute anemia 09/22/2014  . Anxiety 09/22/2014  . 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 Back pain, chronic 09/22/2014  . Chronic cervical pain 09/22/2014  . Clinical depression 09/22/2014  . Acid reflux 09/22/2014  . HLD (hyperlipidemia) 09/22/2014  . BP (high blood pressure) 09/22/2014  . Iron deficiency 09/22/2014  . Disease characterized by destruction of skeletal muscle 09/22/2014  . Apnea, sleep 09/22/2014  . Umbilical hernia without obstruction and without gangrene 09/22/2014  Past Medical History:  Diagnosis Date  . Arthritis    lower back  . Confusion caused by a drug ([place])    fentanyl and oxycodone  . GERD (gastroesophageal reflux disease)   . Headache    past hx, secondary to nerve damage from accident  . Hypertension    CONTROLLED ON MEDS  . Neuromuscular disorder ([place])    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  . Sleep apnea    USES C-PAP    Social History   Social History  . Marital status: Divorced    Spouse name: single  . Number of children: 0  . Years of education: 12   Occupational History  . disability    Social History Main Topics  . Smoking status: Light Tobacco Smoker    Packs/day: 0.25    Years: 20.00    Types: Cigarettes  . Smokeless tobacco: Never Used      Comment: Had quit for 8 yrs. Father died 17-Jan-2023. has had several cigarettes last few days.  . Alcohol use No  . Drug use: No  . Sexual activity: No   Other Topics Concern  . Not on file   Social History Narrative  . No narrative on file    Outpatient Encounter Prescriptions as of 02/06/2016  Medication Sig Note  . ALPRAZolam (XANAX) 1 MG tablet Take 1 tablet three times daily as needed   . Ascorbic Acid (VITAMIN C PO) Take by mouth. am   . B Complex Vitamins (VITAMIN B COMPLEX PO) Take by mouth. am   . cyclobenzaprine (FLEXERIL) 5 MG tablet Take 5 mg by mouth 3 (three) times daily as needed.  09/22/2014: Received from: [facility]  . etodolac (LODINE) 500 MG tablet Take 1 tablet (500 mg total) by mouth 2 (two) times daily.   . fenofibrate (TRICOR) 145 MG tablet TAKE ONE (1) TABLET EACH DAY   . fentaNYL (DURAGESIC - DOSED MCG/HR) 50 MCG/HR Place onto the skin.  09/22/2014: Per pain clinic Received from: [facility]  . furosemide (LASIX) 40 MG tablet TAKE ONE TO TWO TABLETS IN THE MORNING AS NEEDED FOR SWELLING   . hydrochlorothiazide (HYDRODIURIL) 25 MG tablet Take 1 tablet (25 mg total) by mouth daily.   . IRON PO Take by mouth daily. am   . lansoprazole (PREVACID) 30 MG capsule Take 1 capsule (30 mg total) by mouth 2 (two) times daily.   . metoprolol succinate (TOPROL-XL) 50 MG 24 hr tablet Take 1 tablet (50 mg total) by mouth daily.   . Multiple Vitamin (MULTIVITAMIN) capsule Take 1 capsule by mouth daily.   Marland Kitchen oxyCODONE-acetaminophen (PERCOCET) 10-325 MG per tablet Take 1 tablet by mouth every 4 (four) hours as needed. 11/20/2014: Received from: External Pharmacy Received Sig:   . promethazine (PHENERGAN) 25 MG tablet Take 1 tablet (25 mg total) by mouth every 6 (six) hours as needed.   . venlafaxine (EFFEXOR) 75 MG tablet Take 1 tablet (75 mg total) by mouth QID.    No facility-administered encounter medications on file as of 02/06/2016.     Allergies   Allergen Reactions  . Acetaminophen   . Codeine Itching and Nausea And Vomiting  . Opana [Oxymorphone] Swelling    Review of Systems  Constitutional: Positive for fever and malaise/fatigue.  Eyes: Negative.   Respiratory: Positive for cough, shortness of breath and wheezing. Negative for sputum production.   Cardiovascular: Negative for chest pain, palpitations, orthopnea and leg swelling.  Gastrointestinal:  Negative.   Neurological: Positive for dizziness, weakness and headaches.  Endo/Heme/Allergies: Negative.   Psychiatric/Behavioral: Negative.    Objective:  BP 124/78   Pulse 66   Temp 98.6 F (37 C) (Oral)   Resp 18   Wt 180 lb (81.6 kg)   BMI 27.37 kg/m   Physical Exam  Constitutional: He is well-developed, well-nourished, and in no distress.  HENT:  Head: Normocephalic and atraumatic.  Right Ear: External ear normal.  Left Ear: External ear normal.  Nose: Nose normal.  Eyes: Conjunctivae are normal. Pupils are equal, round, and reactive to light.  Neck: Normal range of motion.  Cardiovascular: Normal rate, regular rhythm and normal heart sounds.   Pulmonary/Chest: Effort normal and breath sounds normal.  Abdominal: Soft.  Skin: Skin is warm and dry.  Psychiatric: Mood and affect normal.    Assessment and Plan :   1. Concussion without loss of consciousness, sequela (HCC)  - Omega-3 Fatty Acids (FISH OIL) 1000 MG CAPS; Take 1 capsule (1,000 mg total) by mouth daily.  Dispense: 30 capsule; Refill: 12 - Ambulatory referral to Neurology  2. Bronchitis  - doxycycline (VIBRA-TABS) 100 MG tablet; Take 1 tablet (100 mg total) by mouth 2 (two) times daily.  Dispense: 14 tablet; Refill: 0 3. Chronic back pain 4 cognitive impairment Have started to notes small things with  patient presentation. Possibly worsened by concussion. Certainly worsened by chronic narcotic use for his pain. 5. Headache Much worse since concussion. It is not changed at all since his  imaging which was benign. HPI, Exam and A&P Transcribed under the direction and in the presence of Miguel Aschoff, Brooke Bonito., MD. Electronically Signed: Althea Charon, RMA I have done the exam and reviewed the chart and it is accurate to the best of my knowledge. Miguel Aschoff M.D. [facility]

## 2016-02-10 ENCOUNTER — Inpatient Hospital Stay: Payer: PPO

## 2016-02-10 ENCOUNTER — Ambulatory Visit: Payer: PPO

## 2016-02-11 ENCOUNTER — Inpatient Hospital Stay: Payer: PPO | Admitting: Hematology and Oncology

## 2016-02-11 NOTE — Progress Notes (Deleted)
[facility] day:  02/11/2016   Chief Complaint: Joshua Guerrero is a 60 y.o. male with a lucent skull lesion who seen for review of recent head MRI.  HPI: The patient is last seen in medical oncology clinic on 01/03/2016. At that time, workup revealed no monoclonal protein.   you seen for initial consultation. He had suffered 2 concussions. With his last head injury, he had an MRI which revealed a lucent lesion in the right frontal bone.  Concern was raised that this represented multiple meyloma. Initial radiology review  suggested an intraosseous hemangioma. Outside labs had noted a normal CBC, calcium, creatinine and SPEP. He denied any infections. He had bone pain following his falls.  He underwent a work-up.   Serum protein electrophoresis revealed no monoclonal protein. Immunoglobulin levels included an IgG of 637, IgA 84 and IgA 16. All were slightly.  Kappa free light chains were 7, lambda free light chain 6.6, and ratio 1.06 (normal).  Beta-2 microglobulin was 1.2 (normal).  24 hour urine revealed no monoclonal protein.  Symptomatically, he notes that he is no better from the concussion.  He has trouble remembering things. He has trouble adapting to the routine. He gets confused and lost easily.   Past Medical History:  Diagnosis Date  . Arthritis    lower back  . Confusion caused by a drug ([place])    fentanyl and oxycodone  . GERD (gastroesophageal reflux disease)   . Headache    past hx, secondary to nerve damage from accident  . Hypertension    CONTROLLED ON MEDS  . Neuromuscular disorder ([place])    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  . Sleep apnea    USES C-PAP    Past Surgical History:  Procedure Laterality Date  . COLONOSCOPY WITH PROPOFOL N/A 02/11/2015   Procedure: COLONOSCOPY WITH PROPOFOL;  Surgeon: Lucilla Lame, MD;  Location: [facility];  Service: Endoscopy;  Laterality: N/A;  USES C-PAP  . NASAL  SINUS SURGERY     due to severe sleep apnea  . NECK SURGERY     diffuse disc-and put in possibly screws patient thinks  . SHOULDER SURGERY Bilateral    x 2    Family History  Problem Relation Age of Onset  . Dementia Father   . Cancer Mother   . Cancer Sister   . Cancer Maternal Aunt     Social History:  reports that he has been smoking Cigarettes.  He has a 5.00 pack-year smoking history. He has never used smokeless tobacco. He reports that he does not drink alcohol or use drugs.  The patient is accompanied by his daughter, [name], today.  Allergies:  Allergies  Allergen Reactions  . Acetaminophen   . Codeine Itching and Nausea And Vomiting  . Opana [Oxymorphone] Swelling    Current Medications: Current Outpatient Prescriptions  Medication Sig Dispense Refill  . ALPRAZolam (XANAX) 1 MG tablet Take 1 tablet three times daily as needed 90 tablet 2  . Ascorbic Acid (VITAMIN C PO) Take by mouth. am    . B Complex Vitamins (VITAMIN B COMPLEX PO) Take by mouth. am    . cyclobenzaprine (FLEXERIL) 5 MG tablet Take 5 mg by mouth 3 (three) times daily as needed.     . doxycycline (VIBRA-TABS) 100 MG tablet Take 1 tablet (100 mg total) by mouth 2 (two) times daily. 14 tablet 0  . etodolac (LODINE) 500 MG tablet  Take 1 tablet (500 mg total) by mouth 2 (two) times daily. 60 tablet 5  . fenofibrate (TRICOR) 145 MG tablet TAKE ONE (1) TABLET EACH DAY 30 tablet 12  . fentaNYL (DURAGESIC - DOSED MCG/HR) 50 MCG/HR Place onto the skin.     . furosemide (LASIX) 40 MG tablet TAKE ONE TO TWO TABLETS IN THE MORNING AS NEEDED FOR SWELLING 60 tablet 12  . hydrochlorothiazide (HYDRODIURIL) 25 MG tablet Take 1 tablet (25 mg total) by mouth daily. 90 tablet 3  . IRON PO Take by mouth daily. am    . lansoprazole (PREVACID) 30 MG capsule Take 1 capsule (30 mg total) by mouth 2 (two) times daily. 60 capsule 11  . metoprolol succinate (TOPROL-XL) 50 MG 24 hr tablet Take 1 tablet (50 mg total) by mouth  daily. 90 tablet 3  . Multiple Vitamin (MULTIVITAMIN) capsule Take 1 capsule by mouth daily.    . Omega-3 Fatty Acids (FISH OIL) 1000 MG CAPS Take 1 capsule (1,000 mg total) by mouth daily. 30 capsule 12  . oxyCODONE-acetaminophen (PERCOCET) 10-325 MG per tablet Take 1 tablet by mouth every 4 (four) hours as needed.  0  . promethazine (PHENERGAN) 25 MG tablet Take 1 tablet (25 mg total) by mouth every 6 (six) hours as needed. 120 tablet 5  . venlafaxine (EFFEXOR) 75 MG tablet Take 1 tablet (75 mg total) by mouth QID. 120 tablet 12   No current facility-administered medications for this visit.     Review of Systems:  GENERAL:  No fevers or sweats.  Weight stable. PERFORMANCE STATUS (ECOG):  2 HEENT:  No visual changes, runny nose, sore throat, mouth sores or tenderness. Lungs: No shortness of breath or cough.  No hemoptysis. Cardiac:  No chest pain, palpitations, orthopnea, or PND. GI:  Oxycodone causes constipation.  No nausea, vomiting, diarrhea, melena or hematochezia. GU:  No urgency, frequency, dysuria, or hematuria. Musculoskeletal: No muscle tenderness. Extremities:  No swelling. Skin:  No rashes or skin changes. Neuro:  Memory issues (see HPI).  No numbness or weakness, balance or coordination issues. Endocrine:  No diabetes, thyroid issues, hot flashes or night sweats. Psych:  No mood changes, depression or anxiety. Pain:  Pain s/p fall.. Review of systems:  All other systems reviewed and found to be negative.  Physical Exam: There were no vitals taken for this visit. GENERAL:  Well developed, well nourished, gentleman sitting comfortably in the exam room in no acute distress. MENTAL STATUS:  Alert and oriented to person, place and time. HEAD:  [place] and mustache.  Normocephalic, atraumatic, face symmetric, no Cushingoid features. EYES:  Blue eyes. No conjunctivitis or scleral icterus. NEUROLOGICAL: Unremarkable. PSYCH:  Appropriate.   No visits with results within 3  Day(s) from this visit.  Latest known visit with results is:  Orders Only on 12/26/2015  Component Date Value Ref Range Status  . Total Protein, Urine 01/15/2016 <4.0  Not Estab. mg/dL Final  . Total Protein, Urine-Ur/day 01/15/2016 <45  30 - 150 mg/24 hr Final  . Albumin, U 01/15/2016 46.7  % Final  . ALPHA 1 URINE 01/15/2016 1.0  % Final  . Alpha 2, Urine 01/15/2016 15.0  % Final  . % BETA, Urine 01/15/2016 22.3  % Final  . GAMMA GLOBULIN URINE 01/15/2016 15.0  % Final  . M-SPIKE %, Urine 01/15/2016 Not Observed  Not Observed % Final  . Immunofixation Result, Urine 01/15/2016 Comment   Final  . Note: 01/15/2016 Comment   Final  Comment: (NOTE) Protein electrophoresis scan will follow via computer, mail, or courier delivery.   . Total Volume 01/15/2016 1120   Final    Assessment:  EVREN SHANKLAND is a 60 y.o. male with a lucent lesion on head CT noted after a fall.  Concern was raised for possible multiple myeloma.  Head MRI on 11/08/2015 revealed an ill denied T2 hyperintense enhancing lesion within the right frontal bone and  greater wing of the sphenoid bone adjacent to the orbit.  This corresponded to the lucent lesion on the head CT.  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  CBC on 11/11/2015 revealed a hematocrit of 44.0, hemoglobin 14.3, MCV 92, platelets 385,000, WBC 9300 with an ANC of 5800.  Calcium was 9.6.  Creatinine was 0.67.  SPEP on 07/10/017 revealed no monoclonal protein.    Work-up on 12/19/2015 revealed the following normal studies:  SPEP, 24 hour UPEP, free light chains, beta2-microglobulin.  Immunoglobulin levels were slightly low (IgG 637, IgA 84 and IgA 16).    Symptomatically, he has memory issues since his fall.  Weight is stable.  He denies any fevers or infections.  Exam reveals no adenopathy or hepatosplenomegaly.  Plan: 1.  Discuss work-up.  No evidence of myeloma.  Discuss mild hyogammaglobulinemia.  Etiology unclear.  Plan repeat  testing.   2.  Discuss conversation with [facility] radiology.  Suspect skull lesion is a cavernous hemangioma.  Discuss plan for reimaging in the future. 3.  Anticipate bone survey if bone pain s/p fall persists. 4.  Head MRI on 02/10/2016 f/u skull lesion. 5.  RTC on 02/10/2016 for labs (CBC with diff, CMP, immunoglobulin levels, ANA, LDH). 6.  RTC on 02/11/2016 for MD assessment and review of studies.   Lequita Asal, MD  02/11/2016, 6:06 AM

## 2016-02-14 DIAGNOSIS — S060X1D Concussion with loss of consciousness of 30 minutes or less, subsequent encounter: Secondary | ICD-10-CM | POA: Diagnosis not present

## 2016-02-17 ENCOUNTER — Other Ambulatory Visit: Payer: Self-pay

## 2016-02-17 DIAGNOSIS — F419 Anxiety disorder, unspecified: Secondary | ICD-10-CM

## 2016-02-18 ENCOUNTER — Ambulatory Visit: Payer: PPO

## 2016-02-18 ENCOUNTER — Inpatient Hospital Stay: Payer: PPO

## 2016-02-18 MED ORDER — ALPRAZOLAM 1 MG PO TABS: ORAL_TABLET | ORAL | 2 refills | Status: DC

## 2016-02-19 ENCOUNTER — Ambulatory Visit: Payer: PPO | Admitting: Hematology and Oncology

## 2016-02-24 DIAGNOSIS — M47817 Spondylosis without myelopathy or radiculopathy, lumbosacral region: Secondary | ICD-10-CM | POA: Diagnosis not present

## 2016-02-24 DIAGNOSIS — G894 Chronic pain syndrome: Secondary | ICD-10-CM | POA: Diagnosis not present

## 2016-02-24 DIAGNOSIS — M961 Postlaminectomy syndrome, not elsewhere classified: Secondary | ICD-10-CM | POA: Diagnosis not present

## 2016-02-24 DIAGNOSIS — M47812 Spondylosis without myelopathy or radiculopathy, cervical region: Secondary | ICD-10-CM | POA: Diagnosis not present

## 2016-02-25 ENCOUNTER — Other Ambulatory Visit: Payer: Self-pay | Admitting: Family Medicine

## 2016-03-04 ENCOUNTER — Ambulatory Visit: Admission: RE | Admit: 2016-03-04 | Payer: PPO | Source: Ambulatory Visit

## 2016-03-04 ENCOUNTER — Inpatient Hospital Stay: Payer: PPO | Attending: Hematology and Oncology

## 2016-03-05 ENCOUNTER — Inpatient Hospital Stay: Payer: PPO | Admitting: Hematology and Oncology

## 2016-03-05 DIAGNOSIS — M899 Disorder of bone, unspecified: Secondary | ICD-10-CM | POA: Insufficient documentation

## 2016-03-05 NOTE — Progress Notes (Deleted)
[facility] day:  03/05/2016   Chief Complaint: Joshua Guerrero is a 60 y.o. male with a lucent skull lesion who seen for review of recent head MRI.  HPI: The patient is last seen in medical oncology clinic on 01/03/2016. At that time, workup revealed no monoclonal protein.   you seen for initial consultation. He had suffered 2 concussions. With his last head injury, he had an MRI which revealed a lucent lesion in the right frontal bone.  Concern was raised that this represented multiple meyloma. Initial radiology review  suggested an intraosseous hemangioma. Outside labs had noted a normal CBC, calcium, creatinine and SPEP. He denied any infections. He had bone pain following his falls.  He underwent a work-up.   Serum protein electrophoresis revealed no monoclonal protein. Immunoglobulin levels included an IgG of 637, IgA 84 and IgA 16. All were slightly.  Kappa free light chains were 7, lambda free light chain 6.6, and ratio 1.06 (normal).  Beta-2 microglobulin was 1.2 (normal).  24 hour urine revealed no monoclonal protein.  Symptomatically, he notes that he is no better from the concussion.  He has trouble remembering things. He has trouble adapting to the routine. He gets confused and lost easily.   Past Medical History:  Diagnosis Date  . Arthritis    lower back  . Confusion caused by a drug ([place])    fentanyl and oxycodone  . GERD (gastroesophageal reflux disease)   . Headache    past hx, secondary to nerve damage from accident  . Hypertension    CONTROLLED ON MEDS  . Neuromuscular disorder ([place])    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  . Sleep apnea    USES C-PAP    Past Surgical History:  Procedure Laterality Date  . COLONOSCOPY WITH PROPOFOL N/A 02/11/2015   Procedure: COLONOSCOPY WITH PROPOFOL;  Surgeon: Lucilla Lame, MD;  Location: [facility];  Service: Endoscopy;  Laterality: N/A;  USES C-PAP  . NASAL  SINUS SURGERY     due to severe sleep apnea  . NECK SURGERY     diffuse disc-and put in possibly screws patient thinks  . SHOULDER SURGERY Bilateral    x 2    Family History  Problem Relation Age of Onset  . Dementia Father   . Cancer Mother   . Cancer Sister   . Cancer Maternal Aunt     Social History:  reports that he has been smoking Cigarettes.  He has a 5.00 pack-year smoking history. He has never used smokeless tobacco. He reports that he does not drink alcohol or use drugs.  The patient is accompanied by his daughter, Joshua Guerrero, today.  Allergies:  Allergies  Allergen Reactions  . Acetaminophen   . Codeine Itching and Nausea And Vomiting  . Opana [Oxymorphone] Swelling    Current Medications: Current Outpatient Prescriptions  Medication Sig Dispense Refill  . ALPRAZolam (XANAX) 1 MG tablet Take 1 tablet three times daily as needed 90 tablet 2  . Ascorbic Acid (VITAMIN C PO) Take by mouth. am    . B Complex Vitamins (VITAMIN B COMPLEX PO) Take by mouth. am    . cyclobenzaprine (FLEXERIL) 5 MG tablet Take 5 mg by mouth 3 (three) times daily as needed.     . doxycycline (VIBRA-TABS) 100 MG tablet Take 1 tablet (100 mg total) by mouth 2 (two) times daily. 14 tablet 0  . etodolac (LODINE) 500 MG tablet  Take 1 tablet (500 mg total) by mouth 2 (two) times daily. 60 tablet 5  . fenofibrate (TRICOR) 145 MG tablet TAKE ONE (1) TABLET EACH DAY 30 tablet 12  . fentaNYL (DURAGESIC - DOSED MCG/HR) 50 MCG/HR Place onto the skin.     . furosemide (LASIX) 40 MG tablet TAKE ONE TO TWO TABLETS IN THE MORNING AS NEEDED FOR SWELLING 60 tablet 12  . hydrochlorothiazide (HYDRODIURIL) 25 MG tablet TAKE 1 TABLET BY MOUTH EVERY DAY. 90 tablet 2  . IRON PO Take by mouth daily. am    . lansoprazole (PREVACID) 30 MG capsule Take 1 capsule (30 mg total) by mouth 2 (two) times daily. 60 capsule 11  . metoprolol succinate (TOPROL-XL) 50 MG 24 hr tablet TAKE 1 TABLET BY MOUTH EVERY DAY. 90 tablet 2  .  Multiple Vitamin (MULTIVITAMIN) capsule Take 1 capsule by mouth daily.    . Omega-3 Fatty Acids (FISH OIL) 1000 MG CAPS Take 1 capsule (1,000 mg total) by mouth daily. 30 capsule 12  . oxyCODONE-acetaminophen (PERCOCET) 10-325 MG per tablet Take 1 tablet by mouth every 4 (four) hours as needed.  0  . promethazine (PHENERGAN) 25 MG tablet Take 1 tablet (25 mg total) by mouth every 6 (six) hours as needed. 120 tablet 5  . venlafaxine (EFFEXOR) 75 MG tablet Take 1 tablet (75 mg total) by mouth QID. 120 tablet 12   No current facility-administered medications for this visit.     Review of Systems:  GENERAL:  No fevers or sweats.  Weight stable. PERFORMANCE STATUS (ECOG):  2 HEENT:  No visual changes, runny nose, sore throat, mouth sores or tenderness. Lungs: No shortness of breath or cough.  No hemoptysis. Cardiac:  No chest pain, palpitations, orthopnea, or PND. GI:  Oxycodone causes constipation.  No nausea, vomiting, diarrhea, melena or hematochezia. GU:  No urgency, frequency, dysuria, or hematuria. Musculoskeletal: No muscle tenderness. Extremities:  No swelling. Skin:  No rashes or skin changes. Neuro:  Memory issues (see HPI).  No numbness or weakness, balance or coordination issues. Endocrine:  No diabetes, thyroid issues, hot flashes or night sweats. Psych:  No mood changes, depression or anxiety. Pain:  Pain s/p fall.. Review of systems:  All other systems reviewed and found to be negative.  Physical Exam: There were no vitals taken for this visit. GENERAL:  Well developed, well nourished, gentleman sitting comfortably in the exam room in no acute distress. MENTAL STATUS:  Alert and oriented to person, place and time. HEAD:  [place] and mustache.  Normocephalic, atraumatic, face symmetric, no Cushingoid features. EYES:  Blue eyes. No conjunctivitis or scleral icterus. NEUROLOGICAL: Unremarkable. PSYCH:  Appropriate.   No visits with results within 3 Day(s) from this  visit.  Latest known visit with results is:  Orders Only on 12/26/2015  Component Date Value Ref Range Status  . Total Protein, Urine 01/15/2016 <4.0  Not Estab. mg/dL Final  . Total Protein, Urine-Ur/day 01/15/2016 <45  30 - 150 mg/24 hr Final  . Albumin, U 01/15/2016 46.7  % Final  . ALPHA 1 URINE 01/15/2016 1.0  % Final  . Alpha 2, Urine 01/15/2016 15.0  % Final  . % BETA, Urine 01/15/2016 22.3  % Final  . GAMMA GLOBULIN URINE 01/15/2016 15.0  % Final  . M-SPIKE %, Urine 01/15/2016 Not Observed  Not Observed % Final  . Immunofixation Result, Urine 01/15/2016 Comment   Final  . Note: 01/15/2016 Comment   Final   Comment: (NOTE)  Protein electrophoresis scan will follow via computer, mail, or courier delivery.   . Total Volume 01/15/2016 1120   Final    Assessment:  TRAVEZ STANCIL is a 60 y.o. male with a lucent lesion on head CT noted after a fall.  Concern was raised for possible multiple myeloma.  Head MRI on 11/08/2015 revealed an ill denied T2 hyperintense enhancing lesion within the right frontal bone and  greater wing of the sphenoid bone adjacent to the orbit.  This corresponded to the lucent lesion on the head CT.  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  CBC on 11/11/2015 revealed a hematocrit of 44.0, hemoglobin 14.3, MCV 92, platelets 385,000, WBC 9300 with an ANC of 5800.  Calcium was 9.6.  Creatinine was 0.67.  SPEP on 07/10/017 revealed no monoclonal protein.    Work-up on 12/19/2015 revealed the following normal studies:  SPEP, 24 hour UPEP, free light chains, beta2-microglobulin.  Immunoglobulin levels were slightly low (IgG 637, IgA 84 and IgA 16).    Symptomatically, he has memory issues since his fall.  Weight is stable.  He denies any fevers or infections.  Exam reveals no adenopathy or hepatosplenomegaly.  Plan: 1.  Discuss work-up.  No evidence of myeloma.  Discuss mild hyogammaglobulinemia.  Etiology unclear.  Plan repeat testing.   2.   Discuss conversation with [facility] radiology.  Suspect skull lesion is a cavernous hemangioma.  Discuss plan for reimaging in the future. 3.  Anticipate bone survey if bone pain s/p fall persists. 4.  Head MRI on 02/10/2016 f/u skull lesion. 5.  RTC on 02/10/2016 for labs (CBC with diff, CMP, immunoglobulin levels, ANA, LDH). 6.  RTC on 02/11/2016 for MD assessment and review of studies.   Lequita Asal, MD  03/05/2016, 4:29 AM

## 2016-03-12 ENCOUNTER — Telehealth: Payer: Self-pay | Admitting: *Deleted

## 2016-03-12 ENCOUNTER — Encounter: Payer: Self-pay | Admitting: *Deleted

## 2016-03-12 NOTE — Telephone Encounter (Signed)
Patient has failed three appointments.  Sent discharge letter via certified mail [id number].

## 2016-03-24 DIAGNOSIS — M47817 Spondylosis without myelopathy or radiculopathy, lumbosacral region: Secondary | ICD-10-CM | POA: Diagnosis not present

## 2016-03-24 DIAGNOSIS — M961 Postlaminectomy syndrome, not elsewhere classified: Secondary | ICD-10-CM | POA: Diagnosis not present

## 2016-03-24 DIAGNOSIS — M47812 Spondylosis without myelopathy or radiculopathy, cervical region: Secondary | ICD-10-CM | POA: Diagnosis not present

## 2016-03-24 DIAGNOSIS — G894 Chronic pain syndrome: Secondary | ICD-10-CM | POA: Diagnosis not present

## 2016-04-22 DIAGNOSIS — G894 Chronic pain syndrome: Secondary | ICD-10-CM | POA: Diagnosis not present

## 2016-04-22 DIAGNOSIS — M47817 Spondylosis without myelopathy or radiculopathy, lumbosacral region: Secondary | ICD-10-CM | POA: Diagnosis not present

## 2016-04-22 DIAGNOSIS — M47812 Spondylosis without myelopathy or radiculopathy, cervical region: Secondary | ICD-10-CM | POA: Diagnosis not present

## 2016-04-22 DIAGNOSIS — M961 Postlaminectomy syndrome, not elsewhere classified: Secondary | ICD-10-CM | POA: Diagnosis not present

## 2016-05-22 ENCOUNTER — Other Ambulatory Visit: Payer: Self-pay | Admitting: Family Medicine

## 2016-05-22 DIAGNOSIS — F419 Anxiety disorder, unspecified: Secondary | ICD-10-CM

## 2016-05-24 ENCOUNTER — Emergency Department: Payer: PPO

## 2016-05-24 ENCOUNTER — Inpatient Hospital Stay
Admission: EM | Admit: 2016-05-24 | Discharge: 2016-05-26 | DRG: 918 | Disposition: A | Discharge status: Home / Self Care | Payer: PPO | Attending: Internal Medicine | Admitting: Internal Medicine

## 2016-05-24 ENCOUNTER — Encounter: Payer: Self-pay | Admitting: Intensive Care

## 2016-05-24 DIAGNOSIS — I10 Essential (primary) hypertension: Secondary | ICD-10-CM | POA: Diagnosis present

## 2016-05-24 DIAGNOSIS — T39091A Poisoning by salicylates, accidental (unintentional), initial encounter: Secondary | ICD-10-CM

## 2016-05-24 DIAGNOSIS — W19XXXA Unspecified fall, initial encounter: Secondary | ICD-10-CM | POA: Diagnosis not present

## 2016-05-24 DIAGNOSIS — E785 Hyperlipidemia, unspecified: Secondary | ICD-10-CM | POA: Diagnosis present

## 2016-05-24 DIAGNOSIS — F17210 Nicotine dependence, cigarettes, uncomplicated: Secondary | ICD-10-CM | POA: Diagnosis not present

## 2016-05-24 DIAGNOSIS — K219 Gastro-esophageal reflux disease without esophagitis: Secondary | ICD-10-CM | POA: Diagnosis not present

## 2016-05-24 DIAGNOSIS — R4182 Altered mental status, unspecified: Secondary | ICD-10-CM | POA: Diagnosis present

## 2016-05-24 DIAGNOSIS — T39012A Poisoning by aspirin, intentional self-harm, initial encounter: Secondary | ICD-10-CM

## 2016-05-24 DIAGNOSIS — M542 Cervicalgia: Secondary | ICD-10-CM | POA: Diagnosis not present

## 2016-05-24 DIAGNOSIS — G8929 Other chronic pain: Secondary | ICD-10-CM | POA: Diagnosis present

## 2016-05-24 DIAGNOSIS — G4733 Obstructive sleep apnea (adult) (pediatric): Secondary | ICD-10-CM | POA: Diagnosis not present

## 2016-05-24 DIAGNOSIS — E871 Hypo-osmolality and hyponatremia: Secondary | ICD-10-CM | POA: Diagnosis not present

## 2016-05-24 DIAGNOSIS — S0990XA Unspecified injury of head, initial encounter: Secondary | ICD-10-CM | POA: Diagnosis not present

## 2016-05-24 DIAGNOSIS — Z981 Arthrodesis status: Secondary | ICD-10-CM | POA: Diagnosis not present

## 2016-05-24 DIAGNOSIS — E876 Hypokalemia: Secondary | ICD-10-CM | POA: Diagnosis present

## 2016-05-24 DIAGNOSIS — Z23 Encounter for immunization: Secondary | ICD-10-CM | POA: Diagnosis not present

## 2016-05-24 DIAGNOSIS — Y92009 Unspecified place in unspecified non-institutional (private) residence as the place of occurrence of the external cause: Secondary | ICD-10-CM

## 2016-05-24 DIAGNOSIS — M545 Low back pain: Secondary | ICD-10-CM | POA: Diagnosis not present

## 2016-05-24 DIAGNOSIS — F329 Major depressive disorder, single episode, unspecified: Secondary | ICD-10-CM | POA: Diagnosis present

## 2016-05-24 DIAGNOSIS — Z885 Allergy status to narcotic agent status: Secondary | ICD-10-CM

## 2016-05-24 DIAGNOSIS — M479 Spondylosis, unspecified: Secondary | ICD-10-CM | POA: Diagnosis not present

## 2016-05-24 DIAGNOSIS — G4730 Sleep apnea, unspecified: Secondary | ICD-10-CM | POA: Diagnosis not present

## 2016-05-24 DIAGNOSIS — T50902A Poisoning by unspecified drugs, medicaments and biological substances, intentional self-harm, initial encounter: Secondary | ICD-10-CM

## 2016-05-24 DIAGNOSIS — R51 Headache: Secondary | ICD-10-CM | POA: Diagnosis not present

## 2016-05-24 DIAGNOSIS — Z79899 Other long term (current) drug therapy: Secondary | ICD-10-CM

## 2016-05-24 DIAGNOSIS — S199XXA Unspecified injury of neck, initial encounter: Secondary | ICD-10-CM | POA: Diagnosis not present

## 2016-05-24 HISTORY — DX: Poisoning by salicylates, accidental (unintentional), initial encounter: T39.091A

## 2016-05-24 LAB — COMPREHENSIVE METABOLIC PANEL
ALT: 11 U/L — ABNORMAL LOW (ref 17–63)
ALT: 9 U/L — ABNORMAL LOW (ref 17–63)
AST: 25 U/L (ref 15–41)
AST: 23 U/L (ref 15–41)
Albumin: 4.3 g/dL (ref 3.5–5.0)
Albumin: 3.7 g/dL (ref 3.5–5.0)
Alkaline Phosphatase: 44 U/L (ref 38–126)
Alkaline Phosphatase: 38 U/L (ref 38–126)
Anion gap: 14 (ref 5–15)
Anion gap: 9 (ref 5–15)
BUN: 26 mg/dL — ABNORMAL HIGH (ref 6–20)
BUN: 20 mg/dL (ref 6–20)
CALCIUM: 9 mg/dL (ref 8.9–10.3)
CHLORIDE: 96 mmol/L — AB (ref 101–111)
CHLORIDE: 100 mmol/L — AB (ref 101–111)
CO2: 30 mmol/L (ref 22–32)
CO2: 33 mmol/L — ABNORMAL HIGH (ref 22–32)
CREATININE: 1.25 mg/dL — AB (ref 0.61–1.24)
Calcium: 8.3 mg/dL — ABNORMAL LOW (ref 8.9–10.3)
Creatinine, Ser: 0.85 mg/dL (ref 0.61–1.24)
Glucose, Bld: 94 mg/dL (ref 65–99)
Glucose, Bld: 137 mg/dL — ABNORMAL HIGH (ref 65–99)
POTASSIUM: 2.8 mmol/L — AB (ref 3.5–5.1)
Potassium: 2.7 mmol/L — CL (ref 3.5–5.1)
Sodium: 140 mmol/L (ref 135–145)
Sodium: 142 mmol/L (ref 135–145)
TOTAL PROTEIN: 6.4 g/dL — AB (ref 6.5–8.1)
Total Bilirubin: 0.4 mg/dL (ref 0.3–1.2)
Total Bilirubin: 0.4 mg/dL (ref 0.3–1.2)
Total Protein: 7.3 g/dL (ref 6.5–8.1)

## 2016-05-24 LAB — CBC
HCT: 42.8 % (ref 40.0–52.0)
Hemoglobin: 15 g/dL (ref 13.0–18.0)
MCH: 32.3 pg (ref 26.0–34.0)
MCHC: 35 g/dL (ref 32.0–36.0)
MCV: 92.4 fL (ref 80.0–100.0)
PLATELETS: 272 10*3/uL (ref 150–440)
RBC: 4.63 MIL/uL (ref 4.40–5.90)
RDW: 13.8 % (ref 11.5–14.5)
WBC: 7.3 10*3/uL (ref 3.8–10.6)

## 2016-05-24 LAB — BASIC METABOLIC PANEL
Anion gap: 9 (ref 5–15)
BUN: 18 mg/dL (ref 6–20)
CALCIUM: 8.1 mg/dL — AB (ref 8.9–10.3)
CO2: 34 mmol/L — ABNORMAL HIGH (ref 22–32)
Chloride: 97 mmol/L — ABNORMAL LOW (ref 101–111)
Creatinine, Ser: 0.7 mg/dL (ref 0.61–1.24)
GFR calc Af Amer: >60 mL/min (ref >60)
Glucose, Bld: 129 mg/dL — ABNORMAL HIGH (ref 65–99)
POTASSIUM: 2.7 mmol/L — AB (ref 3.5–5.1)
SODIUM: 140 mmol/L (ref 135–145)

## 2016-05-24 LAB — ETHANOL

## 2016-05-24 LAB — URINALYSIS, ROUTINE W REFLEX MICROSCOPIC
Bilirubin Urine: NEGATIVE
Glucose, UA: NEGATIVE mg/dL
HGB URINE DIPSTICK: NEGATIVE
KETONES UR: 20 mg/dL — AB
LEUKOCYTES UA: NEGATIVE
Nitrite: NEGATIVE
PROTEIN: NEGATIVE mg/dL
Specific Gravity, Urine: 1.017 (ref 1.005–1.030)
pH: 5 (ref 5.0–8.0)

## 2016-05-24 LAB — BLOOD GAS, ARTERIAL
ACID-BASE EXCESS: 4.8 mmol/L — AB (ref 0.0–2.0)
Bicarbonate: 29.2 mmol/L — ABNORMAL HIGH (ref 20.0–28.0)
FIO2: 0.21
O2 SAT: 97.4 %
Patient temperature: 37
pCO2 arterial: 41 mmHg (ref 32.0–48.0)
pH, Arterial: 7.46 — ABNORMAL HIGH (ref 7.350–7.450)
pO2, Arterial: 90 mmHg (ref 83.0–108.0)

## 2016-05-24 LAB — SALICYLATE LEVEL
SALICYLATE LVL: 41.3 mg/dL — AB (ref 2.8–30.0)
SALICYLATE LVL: 33.6 mg/dL — AB (ref 2.8–30.0)
SALICYLATE LVL: 23 mg/dL (ref 2.8–30.0)

## 2016-05-24 LAB — URINE DRUG SCREEN, QUALITATIVE (ARMC ONLY)
Amphetamines, Ur Screen: NOT DETECTED
BARBITURATES, UR SCREEN: NOT DETECTED
BENZODIAZEPINE, UR SCRN: NOT DETECTED
CANNABINOID 50 NG, UR ~~LOC~~: NOT DETECTED
Cocaine Metabolite,Ur ~~LOC~~: NOT DETECTED
MDMA (Ecstasy)Ur Screen: NOT DETECTED
Methadone Scn, Ur: NOT DETECTED
Opiate, Ur Screen: NOT DETECTED
Phencyclidine (PCP) Ur S: NOT DETECTED
TRICYCLIC, UR SCREEN: POSITIVE — AB

## 2016-05-24 LAB — PHOSPHORUS: Phosphorus: 2 mg/dL — ABNORMAL LOW (ref 2.5–4.6)

## 2016-05-24 LAB — PROTIME-INR
INR: 1.21
Prothrombin Time: 15.4 seconds — ABNORMAL HIGH (ref 11.4–15.2)

## 2016-05-24 LAB — ACETAMINOPHEN LEVEL: Acetaminophen (Tylenol), Serum: <10 ug/mL — ABNORMAL LOW (ref 10–30)

## 2016-05-24 LAB — MAGNESIUM
MAGNESIUM: 1.8 mg/dL (ref 1.7–2.4)
Magnesium: 1.8 mg/dL (ref 1.7–2.4)

## 2016-05-24 LAB — TROPONIN I

## 2016-05-24 MED ORDER — POTASSIUM CHLORIDE IN NACL 20-0.9 MEQ/L-% IV SOLN
Freq: Once | INTRAVENOUS | Status: AC
  Administered 2016-05-24: 16:00:00 via INTRAVENOUS
  Filled 2016-05-24: qty 1000

## 2016-05-24 MED ORDER — SODIUM BICARBONATE 8.4 % IV SOLN
INTRAVENOUS | Status: DC
  Administered 2016-05-24: 17:00:00 via INTRAVENOUS
  Filled 2016-05-24 (×9): qty 850

## 2016-05-24 MED ORDER — NALOXONE HCL 2 MG/2ML IJ SOSY
PREFILLED_SYRINGE | INTRAMUSCULAR | Status: AC
  Administered 2016-05-24: 1 mg via INTRAVENOUS
  Filled 2016-05-24: qty 2

## 2016-05-24 MED ORDER — SODIUM BICARBONATE 8.4 % IV SOLN
50.0000 meq | Freq: Once | INTRAVENOUS | Status: AC
  Administered 2016-05-24: 50 meq via INTRAVENOUS
  Filled 2016-05-24: qty 50

## 2016-05-24 MED ORDER — SODIUM CHLORIDE 0.9 % IV SOLN
Freq: Once | INTRAVENOUS | Status: AC
  Administered 2016-05-24: 23:00:00 via INTRAVENOUS

## 2016-05-24 MED ORDER — LORAZEPAM 2 MG/ML IJ SOLN
1.0000 mg | Freq: Once | INTRAMUSCULAR | Status: AC
  Administered 2016-05-24: 1 mg via INTRAVENOUS
  Filled 2016-05-24: qty 1

## 2016-05-24 MED ORDER — NALOXONE HCL 0.4 MG/ML IJ SOLN: 0.2500 mg | Freq: Once | INTRAMUSCULAR | Status: DC

## 2016-05-24 MED ORDER — SODIUM CHLORIDE 0.9 % IV SOLN
30.0000 meq | INTRAVENOUS | Status: AC
  Administered 2016-05-25 (×2): 30 meq via INTRAVENOUS
  Filled 2016-05-24 (×2): qty 15

## 2016-05-24 MED ORDER — NALOXONE HCL 2 MG/2ML IJ SOSY
1.0000 mg | PREFILLED_SYRINGE | Freq: Once | INTRAMUSCULAR | Status: AC
  Administered 2016-05-24: 1 mg via INTRAVENOUS

## 2016-05-24 MED ORDER — HALOPERIDOL LACTATE 5 MG/ML IJ SOLN
5.0000 mg | Freq: Once | INTRAMUSCULAR | Status: AC
  Administered 2016-05-24: 5 mg via INTRAVENOUS
  Filled 2016-05-24: qty 1

## 2016-05-24 NOTE — ED Provider Notes (Signed)
[facility] Emergency Department Provider Note   ____________________________________________   First MD Initiated Contact with Patient 05/24/16 1507     (approximate)  I have reviewed the triage vital signs and the nursing notes.   HISTORY  Chief Complaint Drug Overdose  History limited by decreased responsiveness  HPI RUDI VOLNER is a 61 y.o. male who arrived by EMS. EMS reported that they found the patient in the [place] house after neighbor called. Patient's cousin told EMS the patient has a known problem with Percocet street drugs. Very limited results with Narcan  Past Medical History:  Diagnosis Date  . Arthritis    lower back  . Confusion caused by a drug ([place])    fentanyl and oxycodone  . GERD (gastroesophageal reflux disease)   . Headache    past hx, secondary to nerve damage from accident  . Hypertension    CONTROLLED ON MEDS  . Neuromuscular disorder ([place])    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  . Sleep apnea    USES C-PAP    Patient Active Problem List   Diagnosis Date Noted  . Skull lesion 03/05/2016  . Hypogammaglobulinemia ([place]) 12/19/2015  . BPH (benign prostatic hypertrophy) 11/11/2015  . Personal history of colonic polyps   . Disease of colon   . Benign neoplasm of descending colon   . Benign neoplasm of sigmoid colon   . Rhabdomyolysis 11/20/2014  . Absolute anemia 09/22/2014  . Anxiety 09/22/2014  . 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 Back pain, chronic 09/22/2014  . Chronic cervical pain 09/22/2014  . Clinical depression 09/22/2014  . Acid reflux 09/22/2014  . HLD (hyperlipidemia) 09/22/2014  . BP (high blood pressure) 09/22/2014  . Iron deficiency 09/22/2014  . Disease characterized by destruction of skeletal muscle 09/22/2014  . Apnea, sleep 09/22/2014  . Umbilical hernia without obstruction and without gangrene 09/22/2014    Past Surgical History:  Procedure Laterality Date  .  COLONOSCOPY WITH PROPOFOL N/A 02/11/2015   Procedure: COLONOSCOPY WITH PROPOFOL;  Surgeon: Lucilla Lame, MD;  Location: [facility];  Service: Endoscopy;  Laterality: N/A;  USES C-PAP  . NASAL SINUS SURGERY     due to severe sleep apnea  . NECK SURGERY     diffuse disc-and put in possibly screws patient thinks  . SHOULDER SURGERY Bilateral    x 2    Prior to Admission medications   Medication Sig Start Date End Date Taking? Authorizing Provider  ALPRAZolam Duanne Moron) 1 MG tablet TAKE 1 TABLET BY MOUTH THREE TIMES DAILYAS NEEDED. 05/23/16   Jerrol Banana., MD  Ascorbic Acid (VITAMIN C PO) Take by mouth. am    Historical Provider, MD  B Complex Vitamins (VITAMIN B COMPLEX PO) Take by mouth. am    Historical Provider, MD  cyclobenzaprine (FLEXERIL) 5 MG tablet Take 5 mg by mouth 3 (three) times daily as needed.  07/16/11   Historical Provider, MD  doxycycline (VIBRA-TABS) 100 MG tablet Take 1 tablet (100 mg total) by mouth 2 (two) times daily. 02/06/16   Richard Maceo Pro., MD  etodolac (LODINE) 500 MG tablet Take 1 tablet (500 mg total) by mouth 2 (two) times daily. 05/08/15   Richard Maceo Pro., MD  fenofibrate (TRICOR) 145 MG tablet TAKE ONE (1) TABLET EACH DAY 08/29/15   Richard Maceo Pro., MD  fentaNYL ([place] - DOSED MCG/HR) 50 MCG/HR Place onto the skin.  12/15/11   Historical Provider,  MD  furosemide (LASIX) 40 MG tablet TAKE ONE TO TWO TABLETS IN THE MORNING AS NEEDED FOR SWELLING 10/19/14   Richard Maceo Pro., MD  hydrochlorothiazide (HYDRODIURIL) 25 MG tablet TAKE 1 TABLET BY MOUTH EVERY DAY. 02/25/16   Richard Maceo Pro., MD  IRON PO Take by mouth daily. am    Historical Provider, MD  lansoprazole (PREVACID) 30 MG capsule Take 1 capsule (30 mg total) by mouth 2 (two) times daily. 12/18/15   Richard Maceo Pro., MD  metoprolol succinate (TOPROL-XL) 50 MG 24 hr tablet TAKE 1 TABLET BY MOUTH EVERY DAY. 02/25/16   Richard Maceo Pro., MD  Multiple Vitamin  (MULTIVITAMIN) capsule Take 1 capsule by mouth daily.    Historical Provider, MD  Omega-3 Fatty Acids (FISH OIL) 1000 MG CAPS Take 1 capsule (1,000 mg total) by mouth daily. 02/06/16   Richard Maceo Pro., MD  oxyCODONE-acetaminophen (PERCOCET) 10-325 MG per tablet Take 1 tablet by mouth every 4 (four) hours as needed. 11/13/14   Historical Provider, MD  promethazine (PHENERGAN) 25 MG tablet Take 1 tablet (25 mg total) by mouth every 6 (six) hours as needed. 04/15/15   Richard Maceo Pro., MD  venlafaxine [facility]) 75 MG tablet Take 1 tablet (75 mg total) by mouth QID. 08/20/15   Jerrol Banana., MD    Allergies Acetaminophen; Codeine; and Opana [oxymorphone]  Family History  Problem Relation Age of Onset  . Dementia Father   . Cancer Mother   . Cancer Sister   . Cancer Maternal Aunt     Social History Social History  Substance Use Topics  . Smoking status: Light Tobacco Smoker    Packs/day: 0.25    Years: 20.00    Types: Cigarettes  . Smokeless tobacco: Never Used     Comment: Had quit for 8 yrs. Father died 2023-01-17. has had several cigarettes last few days.  . Alcohol use No    Review of Systems Unavailable due to decreased responsiveness ____________________________________________   PHYSICAL EXAM:  VITAL SIGNS: ED Triage Vitals  Enc Vitals Group     BP 05/24/16 1405 136/69     Pulse Rate 05/24/16 1405 77     Resp 05/24/16 1405 (!) 21     Temp 05/24/16 1405 97.7 F (36.5 C)     Temp Source 05/24/16 1405 Oral     SpO2 05/24/16 1405 100 %     Weight 05/24/16 1407 206 lb (93.4 kg)     Height 05/24/16 1407 5\' 8"  (1.727 m)     Head Circumference --      Peak Flow --      Pain Score --      Pain Loc --      Pain Edu? --      Excl. in [place]? --    Constitutional:Patient lying on his side with deep respirations on was like a small respirations very limited responsiveness will occasionally wake up and mumbles some words and go back to sleep Eyes: Conjunctivae are  normal. Pupils are small and minimally responsive Head: Atraumatic. Nose: No congestion/rhinnorhea. Mouth/Throat: Mucous membranes are moist.  Oropharynx non-erythematous. Neck: No stridor. Cardiovascular: Normal rate, regular rhythm. Grossly normal heart sounds.  Good peripheral circulation. Respiratory: Normal respiratory effort.  No retractions. Lungs CTAB. Gastrointestinal: Soft and nontender. No distention. No Genitourinary: Normal circumcised male Musculoskeletal: No apparent lower extremity tenderness nor edema.  No joint effusions.   ____________________________________________   LABS (all labs ordered are  listed, but only abnormal results are displayed)  Labs Reviewed  COMPREHENSIVE METABOLIC PANEL - Abnormal; Notable for the following:       Result Value   Potassium 2.7 (*)    Chloride 96 (*)    BUN 26 (*)    Creatinine, Ser 1.25 (*)    ALT 11 (*)    All other components within normal limits  SALICYLATE LEVEL - Abnormal; Notable for the following:    Salicylate Lvl [id number] (*)    All other components within normal limits  ACETAMINOPHEN LEVEL - Abnormal; Notable for the following:    Acetaminophen (Tylenol), Serum <10 (*)    All other components within normal limits  URINE DRUG SCREEN, QUALITATIVE ([facility] ONLY) - Abnormal; Notable for the following:    Tricyclic, Ur Screen POSITIVE (*)    All other components within normal limits  BLOOD GAS, ARTERIAL - Abnormal; Notable for the following:    pH, Arterial 7.46 (*)    Bicarbonate 29.2 (*)    Acid-Base Excess 4.8 (*)    All other components within normal limits  URINALYSIS, ROUTINE W REFLEX MICROSCOPIC - Abnormal; Notable for the following:    Color, Urine YELLOW (*)    APPearance CLEAR (*)    Ketones, ur 20 (*)    All other components within normal limits  ETHANOL  CBC  TROPONIN I  MAGNESIUM  COMPREHENSIVE METABOLIC PANEL  SALICYLATE LEVEL   ____________________________________________  EKG EKG #1 shows normal  sinus rhythm rate of 76 left axis no obvious acute ST-T wave changes  EKG #2 shows normal sinus rhythm rate of 63 again left axis no obvious acute ST-T wave changes or QRS duration was 104 ms on the first 05/04/2004 milliseconds on the second one  ____________________________________________  RADIOLOGY  Study Result   CLINICAL DATA:  Pt found laying in her front yard, altered mental status, hx of substance abuse  EXAM: CT HEAD WITHOUT CONTRAST  CT CERVICAL SPINE WITHOUT CONTRAST  TECHNIQUE: Multidetector CT imaging of the head and cervical spine was performed following the standard protocol without intravenous contrast. Multiplanar CT image reconstructions of the cervical spine were also generated.  COMPARISON:  07/27/2008 cervical spine, MR brain 01/05/2008  FINDINGS: CT HEAD FINDINGS  Brain: No evidence of acute infarction, hemorrhage, hydrocephalus, extra-axial collection or mass lesion/mass effect. Mild periventricular white matter low attenuation likely secondary to microvascular disease.  Vascular: No hyperdense vessel or unexpected calcification.  Skull: No osseous abnormality.  Sinuses/Orbits: Visualized paranasal sinuses are clear. Visualized mastoid sinuses are clear. Visualized orbits demonstrate no focal abnormality.  Other: None  CT CERVICAL SPINE FINDINGS  Alignment: Normal.  Skull base and vertebrae: No acute fracture. No primary bone lesion or focal pathologic process.  Soft tissues and spinal canal: No prevertebral fluid or swelling. No visible canal hematoma.  Disc levels: Anterior cervical fusion at C5-6 without hardware failure complication. Solid osseous bridging across the disc space. Left facet arthropathy at C6-7. Mild degenerative disc disease with disc height loss at C7-T1.  Upper chest: Lung apices are clear.  Other: Bilateral carotid artery atherosclerosis.  IMPRESSION: 1. No acute intracranial  pathology. 2. No acute osseous injury the cervical spine. 3. Anterior cervical fusion at C5-6.   Electronically Signed   By: Kathreen Devoid   On: 05/24/2016 17:39     ____________________________________________   PROCEDURES  Procedure(s) performed:  Procedures  Critical Care performed:   ____________________________________________   INITIAL IMPRESSION / ASSESSMENT AND PLAN / ED COURSE  Pertinent  labs & imaging results that were available during my care of the patient were reviewed by me and considered in my medical decision making (see chart for details).  Patient had sonorous respirations and therefore a nasopharyngeal tube was placed this woke up the patient briefly but he went back to sleep almos pulled out the NG the nasopharyngeal tube was given some Haldol and 1 mg of Ativan to sedate him this worked well CT of the head neck was accomplished she returns this is not snoring at present even without the NG tube he is however still having deep rapid respirations and plan on admitting him t immediately respirations were easy patient went off to CT scan      ____________________________________________   FINAL CLINICAL IMPRESSION(S) / ED DIAGNOSES  Final diagnoses:  Intentional aspirin overdose, initial encounter ([place])  Intentional drug overdose, initial encounter ([place])      NEW MEDICATIONS STARTED DURING THIS VISIT:  New Prescriptions   No medications on file     Note:  This document was prepared using Dragon voice recognition software and may include unintentional dictation errors.    Nena Polio, MD 05/24/16 [phone]

## 2016-05-24 NOTE — ED Triage Notes (Addendum)
Patient arrived by EMS from home.  Neighbor called EMS due to finding patient in the yard at his house. It is not known what patient took or how much. Cousin states patient has known problem with percocet and other street drugs. Patient sees pain management for old back and neck injuries and is prescribed percocet and fentanyl. Patient is very lethargic upon arrival and not answering questions. Responds to name. Pt c/o chest pain and neck pain. PAtient lives with a roommate and does not hold a job, patient is on disability.Gilmore Laroche (cousin) is currently working on getting POA. Her number is [phone]

## 2016-05-24 NOTE — Progress Notes (Signed)
  MEDICATION RELATED CONSULT NOTE - INITIAL   Pharmacy Consult for electrolyte replacement Indication: hypokalemia  Allergies  Allergen Reactions  . Acetaminophen   . Codeine Itching and Nausea And Vomiting  . Opana [Oxymorphone] Swelling    Patient Measurements: Height: 5\' 8"  (172.7 cm) Weight: 206 lb (93.4 kg) IBW/kg (Calculated) : 68.4 Adjusted Body Weight:   Vital Signs: Temp: 97.8 F (36.6 C) (01/21 1608) Temp Source: Oral (01/21 1608) BP: 131/71 (01/21 2248) Pulse Rate: 61 (01/21 2126) Intake/Output from previous day: No intake/output data recorded. Intake/Output from this shift: Total I/O In: 1000 [I.V.:1000] Out: 550 [Urine:550]  Labs:  Recent Labs  05/24/16 1404 05/24/16 1809 05/24/16 2151  WBC 7.3  --   --   HGB 15.0  --   --   HCT 42.8  --   --   PLT 272  --   --   CREATININE 1.25* 0.85 0.70  MG 1.8  --  1.8  PHOS  --   --  2.0*  ALBUMIN 4.3 3.7  --   PROT 7.3 6.4*  --   AST 25 23  --   ALT 11* 9*  --   ALKPHOS 44 38  --   BILITOT 0.4 0.4  --    Estimated Creatinine Clearance: 111.6 mL/min (by C-G formula based on SCr of 0.7 mg/dL).   Microbiology: No results found for this or any previous visit (from the past 720 hour(s)).  Medical History: Past Medical History:  Diagnosis Date  . Arthritis    lower back  . Confusion caused by a drug ([place])    fentanyl and oxycodone  . GERD (gastroesophageal reflux disease)   . Headache    past hx, secondary to nerve damage from accident  . Hypertension    CONTROLLED ON MEDS  . Neuromuscular disorder ([place])    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  . Sleep apnea    USES C-PAP    Medications:  Infusions:  . sodium chloride    . dextrose 5 % 850 mL with potassium chloride 40 mEq, sodium bicarbonate 150 mEq infusion Stopped (05/24/16 2250)    Assessment: 60 yom cc drug overdose found by neighbor. Pharmacy consulted to dose electrolytes for hypokalemia.   Goal of Therapy:   Electrolytes WNL  Plan:  Bicarb infusion discontinued for hypokalemia by provider. Switched to NS 250 mL/hr with consult to replace electrolytes. Will give potassium 30 mEQ IV Q4H x 2 doses and recheck all electrolytes after infusion.   Laural Benes, Pharm.D., BCPS Clinical Pharmacist 05/24/2016,11:13 PM

## 2016-05-24 NOTE — ED Notes (Signed)
Report from amber. Pt with perrl 31mm and sluggish. Pt with foley in place. Pt is arousable to sternal rub. resps unlabored.
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
## 2016-05-24 NOTE — ED Notes (Signed)
Dr. Ara Kussmaul notified regarding potassium of 2.7 from repeat lab.

## 2016-05-25 DIAGNOSIS — T39091A Poisoning by salicylates, accidental (unintentional), initial encounter: Principal | ICD-10-CM

## 2016-05-25 LAB — PHOSPHORUS
Phosphorus: 1.4 mg/dL — ABNORMAL LOW (ref 2.5–4.6)
Phosphorus: 1.5 mg/dL — ABNORMAL LOW (ref 2.5–4.6)
Phosphorus: 2.5 mg/dL (ref 2.5–4.6)

## 2016-05-25 LAB — BASIC METABOLIC PANEL
ANION GAP: 7 (ref 5–15)
Anion gap: 7 (ref 5–15)
Anion gap: 7 (ref 5–15)
BUN: 17 mg/dL (ref 6–20)
BUN: 14 mg/dL (ref 6–20)
BUN: 14 mg/dL (ref 6–20)
CO2: 33 mmol/L — ABNORMAL HIGH (ref 22–32)
CO2: 29 mmol/L (ref 22–32)
CO2: 29 mmol/L (ref 22–32)
CREATININE: 0.63 mg/dL (ref 0.61–1.24)
CREATININE: 0.61 mg/dL (ref 0.61–1.24)
Calcium: 8.1 mg/dL — ABNORMAL LOW (ref 8.9–10.3)
Calcium: 8.3 mg/dL — ABNORMAL LOW (ref 8.9–10.3)
Calcium: 8.2 mg/dL — ABNORMAL LOW (ref 8.9–10.3)
Chloride: 101 mmol/L (ref 101–111)
Chloride: 104 mmol/L (ref 101–111)
Chloride: 104 mmol/L (ref 101–111)
Creatinine, Ser: 0.67 mg/dL (ref 0.61–1.24)
GFR calc Af Amer: >60 mL/min (ref >60)
GFR calc non Af Amer: >60 mL/min (ref >60)
Glucose, Bld: 67 mg/dL (ref 65–99)
Glucose, Bld: 75 mg/dL (ref 65–99)
Glucose, Bld: 77 mg/dL (ref 65–99)
POTASSIUM: 3.1 mmol/L — AB (ref 3.5–5.1)
Potassium: 2.6 mmol/L — CL (ref 3.5–5.1)
Potassium: 2.9 mmol/L — ABNORMAL LOW (ref 3.5–5.1)
SODIUM: 141 mmol/L (ref 135–145)
SODIUM: 140 mmol/L (ref 135–145)
SODIUM: 140 mmol/L (ref 135–145)

## 2016-05-25 LAB — MAGNESIUM
MAGNESIUM: 1.8 mg/dL (ref 1.7–2.4)
MAGNESIUM: 1.7 mg/dL (ref 1.7–2.4)
MAGNESIUM: 1.9 mg/dL (ref 1.7–2.4)

## 2016-05-25 LAB — COMPREHENSIVE METABOLIC PANEL
ALBUMIN: 3.5 g/dL (ref 3.5–5.0)
ALT: 10 U/L — ABNORMAL LOW (ref 17–63)
ANION GAP: 7 (ref 5–15)
AST: 22 U/L (ref 15–41)
Alkaline Phosphatase: 36 U/L — ABNORMAL LOW (ref 38–126)
BILIRUBIN TOTAL: 0.5 mg/dL (ref 0.3–1.2)
BUN: 15 mg/dL (ref 6–20)
CALCIUM: 8.3 mg/dL — AB (ref 8.9–10.3)
CO2: 30 mmol/L (ref 22–32)
Chloride: 104 mmol/L (ref 101–111)
Creatinine, Ser: 0.64 mg/dL (ref 0.61–1.24)
GFR calc Af Amer: >60 mL/min (ref >60)
GFR calc non Af Amer: >60 mL/min (ref >60)
GLUCOSE: 73 mg/dL (ref 65–99)
POTASSIUM: 3.2 mmol/L — AB (ref 3.5–5.1)
Sodium: 141 mmol/L (ref 135–145)
Total Protein: 6 g/dL — ABNORMAL LOW (ref 6.5–8.1)

## 2016-05-25 LAB — CBC
HCT: 39 % — ABNORMAL LOW (ref 40.0–52.0)
Hemoglobin: 13.5 g/dL (ref 13.0–18.0)
MCH: 31.7 pg (ref 26.0–34.0)
MCHC: 34.6 g/dL (ref 32.0–36.0)
MCV: 91.5 fL (ref 80.0–100.0)
PLATELETS: 253 10*3/uL (ref 150–440)
RBC: 4.26 MIL/uL — AB (ref 4.40–5.90)
RDW: 14.1 % (ref 11.5–14.5)
WBC: 8 10*3/uL (ref 3.8–10.6)

## 2016-05-25 LAB — POTASSIUM: Potassium: 3.1 mmol/L — ABNORMAL LOW (ref 3.5–5.1)

## 2016-05-25 LAB — APTT: aPTT: 47 seconds — ABNORMAL HIGH (ref 24–36)

## 2016-05-25 LAB — PROTIME-INR
INR: 1.3
Prothrombin Time: 16.3 seconds — ABNORMAL HIGH (ref 11.4–15.2)

## 2016-05-25 MED ORDER — ALBUTEROL SULFATE (2.5 MG/3ML) 0.083% IN NEBU: 2.5000 mg | INHALATION_SOLUTION | Freq: Four times a day (QID) | RESPIRATORY_TRACT | Status: DC | PRN

## 2016-05-25 MED ORDER — SODIUM CHLORIDE 0.9 % IV SOLN: 30.0000 meq | Freq: Once | INTRAVENOUS | Status: DC

## 2016-05-25 MED ORDER — OXYCODONE HCL 5 MG PO TABS
10.0000 mg | ORAL_TABLET | ORAL | Status: DC | PRN
  Administered 2016-05-25: 10 mg via ORAL
  Filled 2016-05-25: qty 2

## 2016-05-25 MED ORDER — ALPRAZOLAM 1 MG PO TABS
1.0000 mg | ORAL_TABLET | Freq: Three times a day (TID) | ORAL | Status: DC | PRN
  Administered 2016-05-25: 1 mg via ORAL
  Filled 2016-05-25: qty 1

## 2016-05-25 MED ORDER — SENNOSIDES-DOCUSATE SODIUM 8.6-50 MG PO TABS: 1.0000 | ORAL_TABLET | Freq: Every evening | ORAL | Status: DC | PRN

## 2016-05-25 MED ORDER — BACLOFEN 10 MG PO TABS: 5.0000 mg | ORAL_TABLET | Freq: Two times a day (BID) | ORAL | Status: DC | PRN

## 2016-05-25 MED ORDER — PANTOPRAZOLE SODIUM 40 MG PO TBEC
40.0000 mg | DELAYED_RELEASE_TABLET | Freq: Every day | ORAL | Status: DC
  Administered 2016-05-25 – 2016-05-26 (×2): 40 mg via ORAL
  Filled 2016-05-25 (×2): qty 1

## 2016-05-25 MED ORDER — POTASSIUM PHOSPHATES 15 MMOLE/5ML IV SOLN
20.0000 meq | Freq: Once | INTRAVENOUS | Status: AC
  Administered 2016-05-25: 20 meq via INTRAVENOUS
  Filled 2016-05-25 (×2): qty 4.55

## 2016-05-25 MED ORDER — SALINE SPRAY 0.65 % NA SOLN
1.0000 | NASAL | Status: DC | PRN
  Administered 2016-05-25: 1 via NASAL
  Filled 2016-05-25: qty 44

## 2016-05-25 MED ORDER — BISACODYL 5 MG PO TBEC: 5.0000 mg | DELAYED_RELEASE_TABLET | Freq: Every day | ORAL | Status: DC | PRN

## 2016-05-25 MED ORDER — POTASSIUM PHOSPHATES 15 MMOLE/5ML IV SOLN: 10.0000 mmol | Freq: Once | INTRAVENOUS | Status: DC

## 2016-05-25 MED ORDER — OXYCODONE HCL 5 MG PO TABS: 10.0000 mg | ORAL_TABLET | ORAL | Status: DC | PRN

## 2016-05-25 MED ORDER — METOPROLOL SUCCINATE ER 50 MG PO TB24
50.0000 mg | ORAL_TABLET | Freq: Every day | ORAL | Status: DC
  Administered 2016-05-25 – 2016-05-26 (×2): 50 mg via ORAL
  Filled 2016-05-25 (×2): qty 1

## 2016-05-25 MED ORDER — ONDANSETRON HCL 4 MG PO TABS: 4.0000 mg | ORAL_TABLET | Freq: Four times a day (QID) | ORAL | Status: DC | PRN

## 2016-05-25 MED ORDER — POTASSIUM CHLORIDE CRYS ER 20 MEQ PO TBCR
40.0000 meq | EXTENDED_RELEASE_TABLET | Freq: Once | ORAL | Status: AC
  Administered 2016-05-25: 40 meq via ORAL
  Filled 2016-05-25: qty 2

## 2016-05-25 MED ORDER — INFLUENZA VAC SPLIT QUAD 0.5 ML IM SUSY
0.5000 mL | PREFILLED_SYRINGE | INTRAMUSCULAR | Status: AC
  Administered 2016-05-26: 0.5 mL via INTRAMUSCULAR
  Filled 2016-05-25: qty 0.5

## 2016-05-25 MED ORDER — POTASSIUM CHLORIDE CRYS ER 20 MEQ PO TBCR
20.0000 meq | EXTENDED_RELEASE_TABLET | Freq: Once | ORAL | Status: AC
  Administered 2016-05-25: 20 meq via ORAL
  Filled 2016-05-25: qty 1

## 2016-05-25 MED ORDER — ONDANSETRON HCL 4 MG/2ML IJ SOLN: 4.0000 mg | Freq: Four times a day (QID) | INTRAMUSCULAR | Status: DC | PRN

## 2016-05-25 MED ORDER — ENOXAPARIN SODIUM 40 MG/0.4ML ~~LOC~~ SOLN
40.0000 mg | SUBCUTANEOUS | Status: DC
  Administered 2016-05-25: 40 mg via SUBCUTANEOUS
  Filled 2016-05-25: qty 0.4

## 2016-05-25 MED ORDER — HYDROCHLOROTHIAZIDE 25 MG PO TABS
25.0000 mg | ORAL_TABLET | Freq: Every day | ORAL | Status: DC
  Administered 2016-05-25 – 2016-05-26 (×2): 25 mg via ORAL
  Filled 2016-05-25 (×2): qty 1

## 2016-05-25 MED ORDER — PNEUMOCOCCAL VAC POLYVALENT 25 MCG/0.5ML IJ INJ
0.5000 mL | INJECTION | INTRAMUSCULAR | Status: AC
  Administered 2016-05-26: 0.5 mL via INTRAMUSCULAR
  Filled 2016-05-25: qty 0.5

## 2016-05-25 MED ORDER — VENLAFAXINE HCL 37.5 MG PO TABS
75.0000 mg | ORAL_TABLET | Freq: Four times a day (QID) | ORAL | Status: DC
  Administered 2016-05-25 – 2016-05-26 (×3): 75 mg via ORAL
  Filled 2016-05-25 (×4): qty 2

## 2016-05-25 MED ORDER — IPRATROPIUM BROMIDE 0.02 % IN SOLN: 0.5000 mg | Freq: Four times a day (QID) | RESPIRATORY_TRACT | Status: DC | PRN

## 2016-05-25 MED ORDER — FENTANYL 50 MCG/HR TD PT72
50.0000 ug | MEDICATED_PATCH | TRANSDERMAL | Status: DC
  Administered 2016-05-25: 50 ug via TRANSDERMAL
  Filled 2016-05-25: qty 1

## 2016-05-25 MED ORDER — SODIUM CHLORIDE 0.9% FLUSH
3.0000 mL | Freq: Two times a day (BID) | INTRAVENOUS | Status: DC
  Administered 2016-05-25 – 2016-05-26 (×3): 3 mL via INTRAVENOUS

## 2016-05-25 MED ORDER — ACETAMINOPHEN 325 MG PO TABS
325.0000 mg | ORAL_TABLET | ORAL | Status: DC | PRN
  Administered 2016-05-25: 325 mg via ORAL
  Filled 2016-05-25: qty 1

## 2016-05-25 MED ORDER — OXYCODONE-ACETAMINOPHEN 10-325 MG PO TABS: 1.0000 | ORAL_TABLET | ORAL | Status: DC | PRN

## 2016-05-25 MED ORDER — POTASSIUM CHLORIDE 20 MEQ PO PACK
40.0000 meq | PACK | Freq: Once | ORAL | Status: AC
  Administered 2016-05-25: 40 meq via ORAL
  Filled 2016-05-25: qty 2

## 2016-05-25 MED ORDER — MAGNESIUM CITRATE PO SOLN: 1.0000 | Freq: Once | ORAL | Status: DC | PRN

## 2016-05-25 MED ORDER — ACETAMINOPHEN 325 MG PO TABS: 325.0000 mg | ORAL_TABLET | Freq: Four times a day (QID) | ORAL | Status: DC | PRN

## 2016-05-25 NOTE — ED Notes (Signed)
Spoke with dr. Ara Kussmaul regarding pt's improvement in status. md to reassess and reassess need for ccu bed.

## 2016-05-25 NOTE — ED Notes (Signed)
Pt is now awake alert, and oriented x3. Pt states "man what happened, why am I in the hospital?" pt oriented by this rn regarding circumstances to ed admission. Pt states has been taking BC powders "all the time".

## 2016-05-25 NOTE — Progress Notes (Signed)
MEDICATION RELATED CONSULT NOTE - FOLLOW UP   Pharmacy Consult for Electrolyte Replacement Indication: hypokalemia  Allergies  Allergen Reactions  . Acetaminophen   . Codeine Itching and Nausea And Vomiting  . Opana [Oxymorphone] Swelling    Patient Measurements: Height: 5\' 8"  (172.7 cm) Weight: 174 lb 1.6 oz (79 kg) IBW/kg (Calculated) : 68.4 Adjusted Body Weight:   Vital Signs: Temp: 98.6 F (37 C) (01/22 1119) Temp Source: Oral (01/22 1119) BP: 117/64 (01/22 1119) Pulse Rate: 65 (01/22 1119) Intake/Output from previous day: 01/21 0701 - 01/22 0700 In: 1530 [I.V.:1000; IV Piggyback:530] Out: [id number] [Urine:1825] Intake/Output from this shift: Total I/O In: 465 [P.O.:465] Out: 550 [Urine:550]  Labs:  Recent Labs  05/24/16 1404 05/24/16 1809 05/24/16 2151  05/25/16 0600 05/25/16 0800 05/25/16 1009  WBC 7.3  --   --   --  8.0  --   --   HGB 15.0  --   --   --  13.5  --   --   HCT 42.8  --   --   --  39.0*  --   --   PLT 272  --   --   --  253  --   --   APTT  --   --   --   --  47*  --   --   CREATININE 1.25* 0.85 0.70  < > 0.64 0.67 0.61  MG 1.8  --  1.8  --  1.8 1.7  --   PHOS  --   --  2.0*  --  1.4* 1.5*  --   ALBUMIN 4.3 3.7  --   --  3.5  --   --   PROT 7.3 6.4*  --   --  6.0*  --   --   AST 25 23  --   --  22  --   --   ALT 11* 9*  --   --  10*  --   --   ALKPHOS 44 38  --   --  36*  --   --   BILITOT 0.4 0.4  --   --  0.5  --   --   < > = values in this interval not displayed. Estimated Creatinine Clearance: 97.4 mL/min (by C-G formula based on SCr of 0.61 mg/dL).   Microbiology: No results found for this or any previous visit (from the past 720 hour(s)).  Assessment: K=2.9, Phos=1.5, Mag=1.7  Goal of Therapy:  Maintain electrolytes WNL  Plan:  Will order KPhos 19mmol IV x one dose and KCl 25mEq PO once. Will recheck labs at 8pm and replete as needed.  Paulina Fusi, PharmD, BCPS 05/25/2016 3:00 PM

## 2016-05-25 NOTE — Progress Notes (Signed)
Patient arrived to 2A Room 250. Patient denies pain and all questions answered. Patient oriented to unit and Fall Safety Plan signed. Skin assessment completed with Lattie Haw RN and skin intact. A&Ox4, VSS, and NSR on verified tele-box #[id number]. Sitter at bedside, bed alarm on, and seizure pads on side of bed x2. Nursing staff will continue to monitor for any changes in patient status. Earleen Reaper, RN

## 2016-05-25 NOTE — Progress Notes (Signed)
[facility] NAME: Joshua Guerrero    MR#:  [id number]  DATE OF BIRTH:  05/10/1955  SUBJECTIVE:   c/o back pain after fall Sunday Does not want imaging or PT consult Denies SI REVIEW OF SYSTEMS:    Review of Systems  Constitutional: Negative.  Negative for chills, fever and malaise/fatigue.  HENT: Negative.  Negative for ear discharge, ear pain, hearing loss, nosebleeds and sore throat.   Eyes: Negative.  Negative for blurred vision and pain.  Respiratory: Negative.  Negative for cough, hemoptysis, shortness of breath and wheezing.   Cardiovascular: Negative.  Negative for chest pain, palpitations and leg swelling.  Gastrointestinal: Negative.  Negative for abdominal pain, blood in stool, diarrhea, nausea and vomiting.  Genitourinary: Negative.  Negative for dysuria.  Musculoskeletal: Positive for back pain.  Skin: Negative.   Neurological: Negative for dizziness, tremors, speech change, focal weakness, seizures and headaches.  Endo/Heme/Allergies: Negative.  Does not bruise/bleed easily.  Psychiatric/Behavioral: Negative.  Negative for depression, hallucinations and suicidal ideas.    Tolerating Diet: npo      DRUG ALLERGIES:   Allergies  Allergen Reactions  . Acetaminophen   . Codeine Itching and Nausea And Vomiting  . Opana [Oxymorphone] Swelling    VITALS:  Blood pressure 117/64, pulse 65, temperature 98.6 F (37 C), temperature source Oral, resp. rate 18, height 5\' 8"  (1.727 m), weight 79 kg (174 lb 1.6 oz), SpO2 95 %.  PHYSICAL EXAMINATION:   Physical Exam    LABORATORY PANEL:   CBC  Recent Labs Lab 05/25/16 0600  WBC 8.0  HGB 13.5  HCT 39.0*  PLT 253   ------------------------------------------------------------------------------------------------------------------  Chemistries   Recent Labs Lab 05/25/16 0600 05/25/16 0800 05/25/16 1009  NA 141 140 140  K 3.2* 3.1* 2.9*  CL 104 104 104   CO2 30 29 29   GLUCOSE 73 75 77  BUN 15 14 14   CREATININE 0.64 0.67 0.61  CALCIUM 8.3* 8.3* 8.2*  MG 1.8 1.7  --   AST 22  --   --   ALT 10*  --   --   ALKPHOS 36*  --   --   BILITOT 0.5  --   --    ------------------------------------------------------------------------------------------------------------------  Cardiac Enzymes  Recent Labs Lab 05/24/16 1404  TROPONINI <0.03   ------------------------------------------------------------------------------------------------------------------  RADIOLOGY:  Ct Head Wo Contrast  Result Date: 05/24/2016 CLINICAL DATA:  Pt found laying in her front yard, altered mental status, hx of substance abuse EXAM: CT HEAD WITHOUT CONTRAST CT CERVICAL SPINE WITHOUT CONTRAST TECHNIQUE: Multidetector CT imaging of the head and cervical spine was performed following the standard protocol without intravenous contrast. Multiplanar CT image reconstructions of the cervical spine were also generated. COMPARISON:  07/27/2008 cervical spine, MR brain 01/05/2008 FINDINGS: CT HEAD FINDINGS Brain: No evidence of acute infarction, hemorrhage, hydrocephalus, extra-axial collection or mass lesion/mass effect. Mild periventricular white matter low attenuation likely secondary to microvascular disease. Vascular: No hyperdense vessel or unexpected calcification. Skull: No osseous abnormality. Sinuses/Orbits: Visualized paranasal sinuses are clear. Visualized mastoid sinuses are clear. Visualized orbits demonstrate no focal abnormality. Other: None CT CERVICAL SPINE FINDINGS Alignment: Normal. Skull base and vertebrae: No acute fracture. No primary bone lesion or focal pathologic process. Soft tissues and spinal canal: No prevertebral fluid or swelling. No visible canal hematoma. Disc levels: Anterior cervical fusion at C5-6 without hardware failure complication. Solid osseous bridging across the disc space. Left facet arthropathy at C6-7. Mild degenerative disc  disease with  disc height loss at C7-T1. Upper chest: Lung apices are clear. Other: Bilateral carotid artery atherosclerosis. IMPRESSION: 1. No acute intracranial pathology. 2. No acute osseous injury the cervical spine. 3. Anterior cervical fusion at C5-6. Electronically Signed   By: Kathreen Devoid   On: 05/24/2016 17:39   Ct Cervical Spine Wo Contrast  Result Date: 05/24/2016 CLINICAL DATA:  Pt found laying in her front yard, altered mental status, hx of substance abuse EXAM: CT HEAD WITHOUT CONTRAST CT CERVICAL SPINE WITHOUT CONTRAST TECHNIQUE: Multidetector CT imaging of the head and cervical spine was performed following the standard protocol without intravenous contrast. Multiplanar CT image reconstructions of the cervical spine were also generated. COMPARISON:  07/27/2008 cervical spine, MR brain 01/05/2008 FINDINGS: CT HEAD FINDINGS Brain: No evidence of acute infarction, hemorrhage, hydrocephalus, extra-axial collection or mass lesion/mass effect. Mild periventricular white matter low attenuation likely secondary to microvascular disease. Vascular: No hyperdense vessel or unexpected calcification. Skull: No osseous abnormality. Sinuses/Orbits: Visualized paranasal sinuses are clear. Visualized mastoid sinuses are clear. Visualized orbits demonstrate no focal abnormality. Other: None CT CERVICAL SPINE FINDINGS Alignment: Normal. Skull base and vertebrae: No acute fracture. No primary bone lesion or focal pathologic process. Soft tissues and spinal canal: No prevertebral fluid or swelling. No visible canal hematoma. Disc levels: Anterior cervical fusion at C5-6 without hardware failure complication. Solid osseous bridging across the disc space. Left facet arthropathy at C6-7. Mild degenerative disc disease with disc height loss at C7-T1. Upper chest: Lung apices are clear. Other: Bilateral carotid artery atherosclerosis. IMPRESSION: 1. No acute intracranial pathology. 2. No acute osseous injury the cervical spine. 3.  Anterior cervical fusion at C5-6. Electronically Signed   By: Kathreen Devoid   On: 05/24/2016 17:39     ASSESSMENT AND PLAN:    61 y.o. male with a history of osteoarthritis, GERD, chronic headaches, hypertension, sleep apnea now being admitted with: 1. AMS 2/2 Salicylate overdose Urine alkalinization with NaHCO3 was successful in ED. Salicylate level has improved therefore we will change to normal saline  Await Psych consult. Patient has IVC.   2. Hypokalemia: Being repleted.  3. Back pain after fall on Sunday: Patient does not want imaging or PT evaluation.  4. OSA: CPAP at night  5. Essential [id number] and Metoprolol    Management plans discussed with the patient and he is in agreement.  CODE STATUS: full  TOTAL TIME TAKING CARE OF THIS PATIENT: 30 minutes.     POSSIBLE D/C today or tomorrow, DEPENDING ON CLINICAL CONDITION.   Louis Ivery M.D on 05/25/2016 at 11:40 AM  Between 7am to 6pm - Pager - [phone] After 6pm go to [email or web address] - password [email or web address] [facility]  Office  [phone]  CC: Primary care physician; [name], MD  Note: This dictation was prepared with Dragon dictation along with smaller phrase technology. Any transcriptional errors that result from this process are unintentional.

## 2016-05-25 NOTE — Progress Notes (Signed)
Called to assess a small abscess type wound on his right hip.  Area of erythema with a small scab in the center.  It was draining bloody pus.  Expressed what was easily removed.  Covered with gauze dressing

## 2016-05-25 NOTE — Progress Notes (Signed)
MEDICATION RELATED CONSULT NOTE - FOLLOW UP   Pharmacy Consult for Electrolyte Replacement Indication: hypokalemia  Allergies  Allergen Reactions  . Acetaminophen   . Codeine Itching and Nausea And Vomiting  . Opana [Oxymorphone] Swelling    Patient Measurements: Height: 5\' 8"  (172.7 cm) Weight: 174 lb 1.6 oz (79 kg) IBW/kg (Calculated) : 68.4 Adjusted Body Weight:   Vital Signs: Temp: 98.6 F (37 C) (01/22 1952) Temp Source: Oral (01/22 1952) BP: 124/64 (01/22 1952) Pulse Rate: 60 (01/22 1952) Intake/Output from previous day: 01/21 0701 - 01/22 0700 In: [id number] [I.V.:1000; IV Piggyback:530] Out: [id number] [Urine:1825] Intake/Output from this shift: No intake/output data recorded.  Labs:  Recent Labs  05/24/16 1404 05/24/16 1809  05/25/16 0600 05/25/16 0800 05/25/16 1009 05/25/16 1953  WBC 7.3  --   --  8.0  --   --   --   HGB 15.0  --   --  13.5  --   --   --   HCT 42.8  --   --  39.0*  --   --   --   PLT 272  --   --  253  --   --   --   APTT  --   --   --  47*  --   --   --   CREATININE 1.25* 0.85  < > 0.64 0.67 0.61  --   MG 1.8  --   < > 1.8 1.7  --  1.9  PHOS  --   --   < > 1.4* 1.5*  --  2.5  ALBUMIN 4.3 3.7  --  3.5  --   --   --   PROT 7.3 6.4*  --  6.0*  --   --   --   AST 25 23  --  22  --   --   --   ALT 11* 9*  --  10*  --   --   --   ALKPHOS 44 38  --  36*  --   --   --   BILITOT 0.4 0.4  --  0.5  --   --   --   < > = values in this interval not displayed. Estimated Creatinine Clearance: 97.4 mL/min (by C-G formula based on SCr of 0.61 mg/dL).   Microbiology: No results found for this or any previous visit (from the past 720 hour(s)).  Assessment: K=3.1, Phos=2.5, Mag=1.9  Goal of Therapy:  Maintain electrolytes WNL  Plan:  Will give KCL po 40 MEQ once. Recheck electrolytes in the AM  Joshua Guerrero, Pharm.D, BCPS Clinical Pharmacist  05/25/2016 8:59 PM
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
## 2016-05-25 NOTE — ED Notes (Signed)
Report to christy in icu, will hold pt in ed until sitter is available in ccu.

## 2016-05-25 NOTE — ED Notes (Signed)
Dr. preddy notified of repeat postassium of 2.6. Order for 20meq po received.

## 2016-05-26 ENCOUNTER — Telehealth: Payer: Self-pay | Admitting: Family Medicine

## 2016-05-26 DIAGNOSIS — E876 Hypokalemia: Secondary | ICD-10-CM | POA: Diagnosis present

## 2016-05-26 LAB — MAGNESIUM: MAGNESIUM: 1.8 mg/dL (ref 1.7–2.4)

## 2016-05-26 LAB — PHOSPHORUS: PHOSPHORUS: 2.2 mg/dL — AB (ref 2.5–4.6)

## 2016-05-26 LAB — POTASSIUM: POTASSIUM: 3.2 mmol/L — AB (ref 3.5–5.1)

## 2016-05-26 MED ORDER — ALUM & MAG HYDROXIDE-SIMETH 200-200-20 MG/5ML PO SUSP: 15.0000 mL | ORAL | Status: DC | PRN

## 2016-05-26 MED ORDER — POTASSIUM PHOSPHATES 15 MMOLE/5ML IV SOLN
20.0000 mmol | Freq: Once | INTRAVENOUS | Status: DC
  Filled 2016-05-26: qty 6.67

## 2016-05-26 NOTE — Progress Notes (Signed)
MEDICATION RELATED CONSULT NOTE - FOLLOW UP   Pharmacy Consult for Electrolyte Replacement Indication: hypokalemia      Allergies  Allergen Reactions  . Acetaminophen   . Codeine Itching and Nausea And Vomiting  . Opana [Oxymorphone] Swelling    Patient Measurements: Height: 5\' 8"  (172.7 cm) Weight: 174 lb 1.6 oz (79 kg) IBW/kg (Calculated) : 68.4 Adjusted Body Weight:   Vital Signs: Temp: 98.6 F (37 C) (01/22 1952) Temp Source: Oral (01/22 1952) BP: 124/64 (01/22 1952) Pulse Rate: 60 (01/22 1952) Intake/Output from previous day: 01/21 0701 - 01/22 0700 In: [id number] [I.V.:1000; IV Piggyback:530] Out: [id number] [Urine:1825] Intake/Output from this shift: No intake/output data recorded.  Labs:  Recent Labs (last 2 labs)    Recent Labs  05/24/16 1404 05/24/16 1809  05/25/16 0600 05/25/16 0800 05/25/16 1009 05/25/16 1953  WBC 7.3  --   --  8.0  --   --   --   HGB 15.0  --   --  13.5  --   --   --   HCT 42.8  --   --  39.0*  --   --   --   PLT 272  --   --  253  --   --   --   APTT  --   --   --  47*  --   --   --   CREATININE 1.25* 0.85  < > 0.64 0.67 0.61  --   MG 1.8  --   < > 1.8 1.7  --  1.9  PHOS  --   --   < > 1.4* 1.5*  --  2.5  ALBUMIN 4.3 3.7  --  3.5  --   --   --   PROT 7.3 6.4*  --  6.0*  --   --   --   AST 25 23  --  22  --   --   --   ALT 11* 9*  --  10*  --   --   --   ALKPHOS 44 38  --  36*  --   --   --   BILITOT 0.4 0.4  --  0.5  --   --   --   < > = values in this interval not displayed.   Estimated Creatinine Clearance: 97.4 mL/min (by C-G formula based on SCr of 0.61 mg/dL).             Microbiology: No results found for this or any previous visit (from the past 720 hour(s)).  Assessment: K=3.2, Phos=2.2, Mag=1.8  Goal of Therapy:  K+ 3.5 - 5.0 Phos 2.5 - 4.5 Mg > 1.8  Plan:  Will give K+phos 20 mmol IV x 1 and recheck BMET and phos in the AM on 1/24.  Thank you for this consult.  Tobie Lords, PharmD,  BCPS Clinical Pharmacist 05/26/2016

## 2016-05-26 NOTE — Progress Notes (Signed)
Patient requested an Advance Directives. Chaplain provided education and a copy of AD to the patient. Patient said that he was planning to fill out the document. Obviously, patient was discharged this afternoon.

## 2016-05-26 NOTE — Discharge Summary (Signed)
[place] [facility] [place] NAME: Joshua Guerrero    MR#:  [id number]  DATE OF BIRTH:  03-16-1956  DATE OF ADMISSION:  05/24/2016 ADMITTING PHYSICIAN: Harvie Bridge, DO  DATE OF DISCHARGE: 05/26/2016  PRIMARY CARE PHYSICIAN: [name], MD    ADMISSION DIAGNOSIS:  Intentional aspirin overdose, initial encounter ([place]) [T39.012A] Intentional drug overdose, initial encounter ([place]) [T50.902A]  DISCHARGE DIAGNOSIS:  Principal Problem:   Salicylate overdose Active Problems:   Hypokalemia   SECONDARY DIAGNOSIS:   Past Medical History:  Diagnosis Date  . Arthritis    lower back  . Confusion caused by a drug ([place])    fentanyl and oxycodone  . GERD (gastroesophageal reflux disease)   . Headache    past hx, secondary to nerve damage from accident  . Hypertension    CONTROLLED ON MEDS  . Neuromuscular disorder ([place])    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  . Sleep apnea    USES C-PAP    HOSPITAL COURSE:   61 y.o.malewith a history of osteoarthritis, GERD, chronic headaches, hypertension, sleep apneanow being admitted with:   1. AMS  2/2 Salicylate overdose Urine alkalinization with NaHCO3was successful in ED. Salicylate level has improved. He initially had an involuntary commitment paperwork signed by ER physician. He had a sitter and psychiatry consultation was obtained. As per psychiatry he did not meet criteria for psychiatric inpatient admission and was not a harm to himself or others. Education was done about the nature of aspirin toxicity and importance of limiting BC powders.   2. Hypokalemia/above phosphatemia: Was repleted.    3. Back pain after fall on Sunday: Patient did not want imaging or PT evaluation. He was able to ambulate. He was not complaining of saddle anesthesia or neuropathic pain.  4. OSA: He will continue CPAP at night  5. Essential HTN: He will continue HCTZ and Metoprolol  6.  Depression: Patient will continue Effexor. He will follow-up with his PCP regarding depression. DISCHARGE CONDITIONS AND DIET:   Stable for discharge on regular diet  CONSULTS OBTAINED:  Treatment Team:  Gonzella Lex, MD  DRUG ALLERGIES:   Allergies  Allergen Reactions  . Acetaminophen   . Codeine Itching and Nausea And Vomiting  . Opana [Oxymorphone] Swelling    DISCHARGE MEDICATIONS:   Current Discharge Medication List    CONTINUE these medications which have NOT CHANGED   Details  ALPRAZolam (XANAX) 1 MG tablet TAKE 1 TABLET BY MOUTH THREE TIMES DAILYAS NEEDED. Qty: 90 tablet, Refills: 1   Associated Diagnoses: Anxiety    baclofen (LIORESAL) 10 MG tablet Take 5 mg by mouth 2 (two) times daily as needed.    fentaNYL (DURAGESIC - DOSED MCG/HR) 50 MCG/HR Place onto the skin.     furosemide (LASIX) 40 MG tablet TAKE ONE TO TWO TABLETS IN THE MORNING AS NEEDED FOR SWELLING Qty: 60 tablet, Refills: 12    hydrochlorothiazide (HYDRODIURIL) 25 MG tablet TAKE 1 TABLET BY MOUTH EVERY DAY. Qty: 90 tablet, Refills: 2    lansoprazole (PREVACID) 30 MG capsule Take 1 capsule (30 mg total) by mouth 2 (two) times daily. Qty: 60 capsule, Refills: 11    venlafaxine (EFFEXOR) 75 MG tablet Take 1 tablet (75 mg total) by mouth QID. Qty: 120 tablet, Refills: 12   Associated Diagnoses: Anxiety    Ascorbic Acid (VITAMIN C PO) Take by mouth. am    B Complex Vitamins (VITAMIN B COMPLEX PO) Take by  mouth. am    fenofibrate (TRICOR) 145 MG tablet TAKE ONE (1) TABLET EACH DAY Qty: 30 tablet, Refills: 12    metoprolol succinate (TOPROL-XL) 50 MG 24 hr tablet TAKE 1 TABLET BY MOUTH EVERY DAY. Qty: 90 tablet, Refills: 2    Multiple Vitamin (MULTIVITAMIN) capsule Take 1 capsule by mouth daily.      STOP taking these medications     oxyCODONE-acetaminophen (PERCOCET) 10-325 MG per tablet      cyclobenzaprine (FLEXERIL) 5 MG tablet      doxycycline (VIBRA-TABS) 100 MG tablet       etodolac (LODINE) 500 MG tablet      IRON PO      Omega-3 Fatty Acids (FISH OIL) 1000 MG CAPS      promethazine (PHENERGAN) 25 MG tablet               Today   CHIEF COMPLAINT:   He is doing well this morning. No acute issues overnight   VITAL SIGNS:  Blood pressure (!) 143/74, pulse (!) 57, temperature 98.2 F (36.8 C), temperature source Oral, resp. rate 14, height 5\' 8"  (1.727 m), weight 79 kg (174 lb 1.6 oz), SpO2 92 %.   REVIEW OF SYSTEMS:  Review of Systems  Constitutional: Negative.  Negative for chills, fever and malaise/fatigue.  HENT: Negative.  Negative for ear discharge, ear pain, hearing loss, nosebleeds and sore throat.   Eyes: Negative.  Negative for blurred vision and pain.  Respiratory: Negative.  Negative for cough, hemoptysis, shortness of breath and wheezing.   Cardiovascular: Negative.  Negative for chest pain, palpitations and leg swelling.  Gastrointestinal: Negative.  Negative for abdominal pain, blood in stool, diarrhea, nausea and vomiting.  Genitourinary: Negative.  Negative for dysuria.  Musculoskeletal: Positive for back pain.  Skin: Negative.   Neurological: Negative for dizziness, tremors, speech change, focal weakness, seizures and headaches.  Endo/Heme/Allergies: Negative.  Does not bruise/bleed easily.  Psychiatric/Behavioral: Positive for depression. Negative for hallucinations and suicidal ideas.     PHYSICAL EXAMINATION:  GENERAL:  61 y.o.-year-old patient lying in the bed with no acute distress.  NECK:  Supple, no jugular venous distention. No thyroid enlargement, no tenderness.  LUNGS: Normal breath sounds bilaterally, no wheezing, rales,rhonchi  No use of accessory muscles of respiration.  CARDIOVASCULAR: S1, S2 normal. No murmurs, rubs, or gallops.  ABDOMEN: Soft, non-tender, non-distended. Bowel sounds present. No organomegaly or mass.  EXTREMITIES: No pedal edema, cyanosis, or clubbing.  PSYCHIATRIC: The patient is alert  and oriented x 3.  SKIN: No obvious rash, lesion, or ulcer.   DATA REVIEW:   CBC  Recent Labs Lab 05/25/16 0600  WBC 8.0  HGB 13.5  HCT 39.0*  PLT 253    Chemistries   Recent Labs Lab 05/25/16 0600  05/25/16 1009  05/26/16 0449  NA 141  < > 140  --   --   K 3.2*  < > 2.9*  < > 3.2*  CL 104  < > 104  --   --   CO2 30  < > 29  --   --   GLUCOSE 73  < > 77  --   --   BUN 15  < > 14  --   --   CREATININE 0.64  < > 0.61  --   --   CALCIUM 8.3*  < > 8.2*  --   --   MG 1.8  < >  --   < > 1.8  AST  22  --   --   --   --   ALT 10*  --   --   --   --   ALKPHOS 36*  --   --   --   --   BILITOT 0.5  --   --   --   --   < > = values in this interval not displayed.  Cardiac Enzymes  Recent Labs Lab 05/24/16 1404  TROPONINI <0.03    Microbiology Results  @[email or web address] @  RADIOLOGY:  Ct Head Wo Contrast  Result Date: 05/24/2016 CLINICAL DATA:  Pt found laying in her front yard, altered mental status, hx of substance abuse EXAM: CT HEAD WITHOUT CONTRAST CT CERVICAL SPINE WITHOUT CONTRAST TECHNIQUE: Multidetector CT imaging of the head and cervical spine was performed following the standard protocol without intravenous contrast. Multiplanar CT image reconstructions of the cervical spine were also generated. COMPARISON:  07/27/2008 cervical spine, MR brain 01/05/2008 FINDINGS: CT HEAD FINDINGS Brain: No evidence of acute infarction, hemorrhage, hydrocephalus, extra-axial collection or mass lesion/mass effect. Mild periventricular white matter low attenuation likely secondary to microvascular disease. Vascular: No hyperdense vessel or unexpected calcification. Skull: No osseous abnormality. Sinuses/Orbits: Visualized paranasal sinuses are clear. Visualized mastoid sinuses are clear. Visualized orbits demonstrate no focal abnormality. Other: None CT CERVICAL SPINE FINDINGS Alignment: Normal. Skull base and vertebrae: No acute fracture. No primary bone lesion or focal pathologic process.  Soft tissues and spinal canal: No prevertebral fluid or swelling. No visible canal hematoma. Disc levels: Anterior cervical fusion at C5-6 without hardware failure complication. Solid osseous bridging across the disc space. Left facet arthropathy at C6-7. Mild degenerative disc disease with disc height loss at C7-T1. Upper chest: Lung apices are clear. Other: Bilateral carotid artery atherosclerosis. IMPRESSION: 1. No acute intracranial pathology. 2. No acute osseous injury the cervical spine. 3. Anterior cervical fusion at C5-6. Electronically Signed   By: Kathreen Devoid   On: 05/24/2016 17:39   Ct Cervical Spine Wo Contrast  Result Date: 05/24/2016 CLINICAL DATA:  Pt found laying in her front yard, altered mental status, hx of substance abuse EXAM: CT HEAD WITHOUT CONTRAST CT CERVICAL SPINE WITHOUT CONTRAST TECHNIQUE: Multidetector CT imaging of the head and cervical spine was performed following the standard protocol without intravenous contrast. Multiplanar CT image reconstructions of the cervical spine were also generated. COMPARISON:  07/27/2008 cervical spine, MR brain 01/05/2008 FINDINGS: CT HEAD FINDINGS Brain: No evidence of acute infarction, hemorrhage, hydrocephalus, extra-axial collection or mass lesion/mass effect. Mild periventricular white matter low attenuation likely secondary to microvascular disease. Vascular: No hyperdense vessel or unexpected calcification. Skull: No osseous abnormality. Sinuses/Orbits: Visualized paranasal sinuses are clear. Visualized mastoid sinuses are clear. Visualized orbits demonstrate no focal abnormality. Other: None CT CERVICAL SPINE FINDINGS Alignment: Normal. Skull base and vertebrae: No acute fracture. No primary bone lesion or focal pathologic process. Soft tissues and spinal canal: No prevertebral fluid or swelling. No visible canal hematoma. Disc levels: Anterior cervical fusion at C5-6 without hardware failure complication. Solid osseous bridging across the  disc space. Left facet arthropathy at C6-7. Mild degenerative disc disease with disc height loss at C7-T1. Upper chest: Lung apices are clear. Other: Bilateral carotid artery atherosclerosis. IMPRESSION: 1. No acute intracranial pathology. 2. No acute osseous injury the cervical spine. 3. Anterior cervical fusion at C5-6. Electronically Signed   By: Kathreen Devoid   On: 05/24/2016 17:39      Management plans discussed with the patient and he is in agreement. Stable  for discharge   Patient should follow up with pcp  CODE STATUS:     Code Status Orders        Start     Ordered   05/25/16 0405  Full code  Continuous     05/25/16 0405    Code Status History    Date Active Date Inactive Code Status Order ID Comments User Context   This patient has a current code status but no historical code status.    Advance Directive Documentation   [place] Most Recent Value  Type of Advance Directive  Living will  Pre-existing out of facility DNR order (yellow form or pink MOST form)  No data  "MOST" Form in Place?  No data      TOTAL TIME TAKING CARE OF THIS PATIENT: 37 minutes.    Note: This dictation was prepared with Dragon dictation along with smaller phrase technology. Any transcriptional errors that result from this process are unintentional.  Albert Hersch M.D on 05/26/2016 at 9:11 AM  Between 7am to 6pm - Pager - [phone] After 6pm go to [email or web address] - password [facility]  Office  [phone]  CC: Primary care physician; [name], MD

## 2016-05-26 NOTE — Telephone Encounter (Signed)
Pt is being discharges from [facility] today for a salicylate overdose.  I have scheduled a hospital follow up/MW

## 2016-05-26 NOTE — Telephone Encounter (Signed)
Please call for transition of care. Thank you-aa 

## 2016-05-26 NOTE — Progress Notes (Signed)
Patient is being discharged to home, his cousin will be monitoring his status while living alone.  No new medications have been ordered.  I reviewed with him the medications that he should stop.  He has an appointment with his PCP next week. I spoke at length with his cousin yesterday about what his needs will likely be.

## 2016-05-27 ENCOUNTER — Telehealth: Payer: Self-pay

## 2016-05-27 DIAGNOSIS — M47817 Spondylosis without myelopathy or radiculopathy, lumbosacral region: Secondary | ICD-10-CM | POA: Diagnosis not present

## 2016-05-27 DIAGNOSIS — G894 Chronic pain syndrome: Secondary | ICD-10-CM | POA: Diagnosis not present

## 2016-05-27 DIAGNOSIS — M47812 Spondylosis without myelopathy or radiculopathy, cervical region: Secondary | ICD-10-CM | POA: Diagnosis not present

## 2016-05-27 DIAGNOSIS — M961 Postlaminectomy syndrome, not elsewhere classified: Secondary | ICD-10-CM | POA: Diagnosis not present

## 2016-05-27 NOTE — Telephone Encounter (Signed)
Transition Care Management Follow-up Telephone Call    Date discharged? 05/27/15  How have you been since you were released from the hospital? Recovering, trying to get back in routine.   Any patient concerns? None currently   Items Reviewed:  Medications reviewed: No, daughter states pt is going to see Dr. Greta Doom today and he will discuss and probably change pts current medication regimen. Pt will bring an updated list to next apt.  Allergies reviewed: Yes  Dietary changes reviewed: N/A  Referrals reviewed: N/A   Functional Questionnaire:  Independent - I Dependent - D    Activities of Daily Living (ADLs):    Personal hygiene - I Dressing - I Eating - I Maintaining continence - I Transferring - I   Independent Activities of Daily Living (iADLs): Basic communication skills - I Transportation - I Meal preparation  - I Shopping - I Housework - I Managing medications - I  Managing personal finances - I   Confirmed importance and date/time of follow-up visits scheduled YES  Provider Appointment booked with PCP on 06/02/16 @ 3:15 PM.  Confirmed with patient if condition begins to worsen call PCP or go to the ER.  Patient was given the office number and encouraged to call back with question or concerns: YES

## 2016-05-27 NOTE — Telephone Encounter (Signed)
Completed -MM

## 2016-06-02 ENCOUNTER — Inpatient Hospital Stay: Payer: PPO | Admitting: Family Medicine

## 2016-06-03 ENCOUNTER — Encounter: Payer: Self-pay | Admitting: Family Medicine

## 2016-06-03 ENCOUNTER — Ambulatory Visit (INDEPENDENT_AMBULATORY_CARE_PROVIDER_SITE_OTHER): Payer: PPO | Admitting: Family Medicine

## 2016-06-03 VITALS — BP 124/68 | HR 78 | Temp 98.2°F | Resp 16 | Wt 183.0 lb

## 2016-06-03 DIAGNOSIS — F339 Major depressive disorder, recurrent, unspecified: Secondary | ICD-10-CM

## 2016-06-03 DIAGNOSIS — M549 Dorsalgia, unspecified: Secondary | ICD-10-CM

## 2016-06-03 DIAGNOSIS — G4489 Other headache syndrome: Secondary | ICD-10-CM | POA: Diagnosis not present

## 2016-06-03 DIAGNOSIS — G8929 Other chronic pain: Secondary | ICD-10-CM | POA: Diagnosis not present

## 2016-06-03 DIAGNOSIS — W19XXXA Unspecified fall, initial encounter: Secondary | ICD-10-CM

## 2016-06-03 DIAGNOSIS — S060X0S Concussion without loss of consciousness, sequela: Secondary | ICD-10-CM | POA: Diagnosis not present

## 2016-06-03 DIAGNOSIS — M542 Cervicalgia: Secondary | ICD-10-CM | POA: Diagnosis not present

## 2016-06-03 DIAGNOSIS — E785 Hyperlipidemia, unspecified: Secondary | ICD-10-CM

## 2016-06-03 DIAGNOSIS — T39091A Poisoning by salicylates, accidental (unintentional), initial encounter: Secondary | ICD-10-CM

## 2016-06-03 DIAGNOSIS — Z09 Encounter for follow-up examination after completed treatment for conditions other than malignant neoplasm: Secondary | ICD-10-CM

## 2016-06-03 MED ORDER — ETODOLAC 500 MG PO TABS: 500.0000 mg | ORAL_TABLET | Freq: Two times a day (BID) | ORAL | 3 refills | Status: DC

## 2016-06-03 MED ORDER — PROMETHAZINE HCL 25 MG PO TABS: 25.0000 mg | ORAL_TABLET | Freq: Three times a day (TID) | ORAL | 5 refills | Status: DC | PRN

## 2016-06-03 MED ORDER — DULOXETINE HCL 60 MG PO CPEP: 60.0000 mg | ORAL_CAPSULE | Freq: Every day | ORAL | 3 refills | Status: DC

## 2016-06-03 MED ORDER — FENOFIBRATE 145 MG PO TABS: 145.0000 mg | ORAL_TABLET | Freq: Every day | ORAL | 12 refills | Status: DC

## 2016-06-03 NOTE — Patient Instructions (Signed)
Stop Effexor and start generic Cymbalta.

## 2016-06-03 NOTE — Progress Notes (Signed)
Subjective:  HPI Hospital stay [id number]  Dx:  Salicylate overdose hypokalemia Intentional aspirin overdose  Psychiatry consults done did not meet criteria for psych admission  D/C oxycodone, flexeril, doxy, etodolac, iron, fish oil, phenergan   Pt would also like to discuss his depression. The Effexor helps with the "doom and gloom" but he has little interest in things he used to love. He reports that  He just can not get motivated. He would like to see if he could try pristiq, he used to take it but had to stop because of the price.  Pt also needs a letter for lawyer stating that he is not able to make his own decisions with bill pay and health matters for his cousin to be power of attorney. He will need refills on Lodine, promethazine and Fenofibrate.    Prior to Admission medications   Medication Sig Start Date End Date Taking? Authorizing Provider  ALPRAZolam Duanne Moron) 1 MG tablet TAKE 1 TABLET BY MOUTH THREE TIMES DAILYAS NEEDED. 05/23/16   Jerrol Banana., MD  Ascorbic Acid (VITAMIN C PO) Take by mouth. am    Historical Provider, MD  B Complex Vitamins (VITAMIN B COMPLEX PO) Take by mouth. am    Historical Provider, MD  baclofen (LIORESAL) 10 MG tablet Take 5 mg by mouth 2 (two) times daily as needed. 05/22/16   Historical Provider, MD  fenofibrate (TRICOR) 145 MG tablet TAKE ONE (1) TABLET EACH DAY Patient not taking: Reported on 05/24/2016 08/29/15   Jerrol Banana., MD  fentaNYL (DURAGESIC - DOSED MCG/HR) 50 MCG/HR Place onto the skin.  12/15/11   Historical Provider, MD  furosemide (LASIX) 40 MG tablet TAKE ONE TO TWO TABLETS IN THE MORNING AS NEEDED FOR SWELLING 10/19/14   Tomasz Steeves Maceo Pro., MD  hydrochlorothiazide (HYDRODIURIL) 25 MG tablet TAKE 1 TABLET BY MOUTH EVERY DAY. 02/25/16   Raphaela Cannaday Maceo Pro., MD  lansoprazole (PREVACID) 30 MG capsule Take 1 capsule (30 mg total) by mouth 2 (two) times daily. 12/18/15   Elizardo Chilson Maceo Pro., MD  metoprolol  succinate (TOPROL-XL) 50 MG 24 hr tablet TAKE 1 TABLET BY MOUTH EVERY DAY. Patient not taking: Reported on 05/24/2016 02/25/16   Jerrol Banana., MD  Multiple Vitamin (MULTIVITAMIN) capsule Take 1 capsule by mouth daily.    Historical Provider, MD  venlafaxine (EFFEXOR) 75 MG tablet Take 1 tablet (75 mg total) by mouth QID. 08/20/15   Jerrol Banana., MD    Patient Active Problem List   Diagnosis Date Noted  . Hypokalemia 05/26/2016  . Salicylate overdose [id number]  . Skull lesion 03/05/2016  . Hypogammaglobulinemia ([place]) 12/19/2015  . BPH (benign prostatic hypertrophy) 11/11/2015  . Personal history of colonic polyps   . Disease of colon   . Benign neoplasm of descending colon   . Benign neoplasm of sigmoid colon   . Rhabdomyolysis 11/20/2014  . Absolute anemia 09/22/2014  . Anxiety 09/22/2014  . 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 Back pain, chronic 09/22/2014  . Chronic cervical pain 09/22/2014  . Clinical depression 09/22/2014  . Acid reflux 09/22/2014  . HLD (hyperlipidemia) 09/22/2014  . BP (high blood pressure) 09/22/2014  . Iron deficiency 09/22/2014  . Disease characterized by destruction of skeletal muscle 09/22/2014  . Apnea, sleep 09/22/2014  . Umbilical hernia without obstruction and without gangrene 09/22/2014    Past Medical History:  Diagnosis Date  . Arthritis    lower back  .  Confusion caused by a drug ([place])    fentanyl and oxycodone  . GERD (gastroesophageal reflux disease)   . Headache    past hx, secondary to nerve damage from accident  . Hypertension    CONTROLLED ON MEDS  . Neuromuscular disorder ([place])    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  . Sleep apnea    USES C-PAP    Social History   Social History  . Marital status: Divorced    Spouse name: single  . Number of children: 0  . Years of education: 12   Occupational History  . disability    Social History Main Topics  . Smoking status: Light Tobacco  Smoker    Packs/day: 0.25    Years: 20.00    Types: Cigarettes  . Smokeless tobacco: Never Used     Comment: Had quit for 8 yrs. Father died Dec 25, 2022. has had several cigarettes last few days.  . Alcohol use No  . Drug use: No  . Sexual activity: No   Other Topics Concern  . Not on file   Social History Narrative  . No narrative on file    Allergies  Allergen Reactions  . Acetaminophen   . Codeine Itching and Nausea And Vomiting  . Opana [Oxymorphone] Swelling    Review of Systems  Constitutional: Positive for malaise/fatigue.  HENT: Negative.   Eyes: Negative.   Respiratory: Negative.   Cardiovascular: Negative.   Gastrointestinal: Negative.   Genitourinary: Negative.   Musculoskeletal: Positive for back pain.  Skin: Negative.   Neurological: Negative.   Endo/Heme/Allergies: Negative.   Psychiatric/Behavioral: Positive for depression and memory loss.    Immunization History  Administered Date(s) Administered  . Influenza,inj,Quad PF,36+ Mos 02/20/2015, 05/26/2016  . Pneumococcal Polysaccharide-23 05/26/2016  . Tdap 07/31/2008    Objective:  BP 124/68 (BP Location: Left Arm, Patient Position: Sitting, Cuff Size: Normal)   Pulse 78   Temp 98.2 F (36.8 C) (Oral)   Resp 16   Wt 183 lb (83 kg)   SpO2 97%   BMI 27.83 kg/m   Physical Exam  Constitutional: He is oriented to person, place, and time and well-developed, well-nourished, and in no distress.  Eyes: Conjunctivae and EOM are normal. Pupils are equal, round, and reactive to light.  Neck: Normal range of motion. Neck supple.  Cardiovascular: Normal rate, regular rhythm, normal heart sounds and intact distal pulses.   Pulmonary/Chest: Effort normal and breath sounds normal.  Abdominal: There is tenderness (mildly tender in t spine and lumber area).  Musculoskeletal: Normal range of motion.  Neurological: He is alert and oriented to person, place, and time. GCS score is 15.  Skin: Skin is warm and dry.    Psychiatric: Mood, memory, affect and judgment normal.    Lab Results  Component Value Date   WBC 8.0 05/25/2016   HGB 13.5 05/25/2016   HCT 39.0 (L) 05/25/2016   PLT 253 05/25/2016   GLUCOSE 77 05/25/2016   CHOL 242 (H) 11/11/2015   TRIG 198 (H) 11/11/2015   HDL 33 (L) 11/11/2015   LDLCALC 169 (H) 11/11/2015   TSH 0.757 11/11/2015   INR 1.30 05/25/2016   HGBA1C 5.4 11/11/2015    CMP     Component Value Date/Time   NA 140 05/25/2016 1009   NA 139 11/11/2015 1529   NA 135 (L) 12/12/2011 1740   K 3.2 (L) 05/26/2016 0449   K 3.0 (L) 12/12/2011 1740   CL 104 05/25/2016 1009  CL 94 (L) 12/12/2011 1740   CO2 29 05/25/2016 1009   CO2 34 (H) 12/12/2011 1740   GLUCOSE 77 05/25/2016 1009   GLUCOSE 112 (H) 12/12/2011 1740   BUN 14 05/25/2016 1009   BUN 9 11/11/2015 1529   BUN 9 12/12/2011 1740   CREATININE 0.61 05/25/2016 1009   CREATININE 1.09 12/12/2011 1740   CALCIUM 8.2 (L) 05/25/2016 1009   CALCIUM 8.8 12/12/2011 1740   PROT 6.0 (L) 05/25/2016 0600   PROT 6.6 11/11/2015 1529   PROT 6.9 12/12/2011 1740   ALBUMIN 3.5 05/25/2016 0600   ALBUMIN 4.3 11/11/2015 1529   ALBUMIN 3.3 (L) 12/12/2011 1740   AST 22 05/25/2016 0600   AST 83 (H) 12/12/2011 1740   ALT 10 (L) 05/25/2016 0600   ALT 73 12/12/2011 1740   ALKPHOS 36 (L) 05/25/2016 0600   ALKPHOS 44 (L) 12/12/2011 1740   BILITOT 0.5 05/25/2016 0600   BILITOT <0.2 11/11/2015 1529   BILITOT 0.3 12/12/2011 1740   GFRNONAA >60 05/25/2016 1009   GFRNONAA >60 12/12/2011 1740   GFRAA >60 05/25/2016 1009   GFRAA >60 12/12/2011 1740    Assessment and Plan :  1. Hospital discharge follow-up   2. Salicylate overdose, accidental or unintentional, initial encounter Pt claims it was accidental.  3. Concussion without loss of consciousness, sequela ([place]) Memory loss possibly due to this and his medication. Will write letter for POA.   4. Chronic back pain, unspecified back location, unspecified back pain  laterality Followed by Dr Marin Roberts. - etodolac (LODINE) 500 MG tablet; Take 1 tablet (500 mg total) by mouth 2 (two) times daily.  Dispense: 60 tablet; Refill: 3 - DG Lumbar Spine Complete; Future - DG Thoracic Spine 4V; Future  5. Episode of recurrent major depressive disorder, unspecified depression episode severity ([place]) Stop Effexor and start Cymbalta. Follow up in 1 month.  - DULoxetine (CYMBALTA) 60 MG capsule; Take 1 capsule (60 mg total) by mouth daily.  Dispense: 30 capsule; Refill: 3  6. Other headache syndrome Chronic. 7. Chronic cervical pain   8. Hyperlipidemia, unspecified hyperlipidemia type  - fenofibrate (TRICOR) 145 MG tablet; Take 1 tablet (145 mg total) by mouth daily.  Dispense: 30 tablet; Refill: 12  9. Fall, initial encounter  - DG Lumbar Spine Complete; Future - DG Thoracic Spine 4V; Future 10.MCI Certainly worsened by medication use.  HPI, Exam, and A&P Transcribed under the direction and in the presence of Naomie Crow L. Cranford Mon, MD  Electronically Signed: Katina Dung, [place] MD [facility] 06/03/2016 3:29 PM

## 2016-06-08 ENCOUNTER — Ambulatory Visit (INDEPENDENT_AMBULATORY_CARE_PROVIDER_SITE_OTHER): Payer: PPO | Admitting: Family Medicine

## 2016-06-08 ENCOUNTER — Telehealth: Payer: Self-pay

## 2016-06-08 ENCOUNTER — Encounter: Payer: Self-pay | Admitting: Family Medicine

## 2016-06-08 VITALS — BP 140/74 | HR 76 | Temp 98.3°F | Resp 18 | Wt 185.0 lb

## 2016-06-08 DIAGNOSIS — R51 Headache: Secondary | ICD-10-CM | POA: Diagnosis not present

## 2016-06-08 DIAGNOSIS — M549 Dorsalgia, unspecified: Secondary | ICD-10-CM

## 2016-06-08 DIAGNOSIS — F419 Anxiety disorder, unspecified: Secondary | ICD-10-CM

## 2016-06-08 DIAGNOSIS — G8929 Other chronic pain: Secondary | ICD-10-CM | POA: Diagnosis not present

## 2016-06-08 DIAGNOSIS — R519 Headache, unspecified: Secondary | ICD-10-CM

## 2016-06-08 MED ORDER — KETOROLAC TROMETHAMINE 60 MG/2ML IM SOLN
60.0000 mg | Freq: Once | INTRAMUSCULAR | Status: AC
  Administered 2016-06-08: 60 mg via INTRAMUSCULAR

## 2016-06-08 NOTE — Progress Notes (Signed)
Subjective:  HPI Pt is here for headache and swollen ankles. He has not taken his lasix for a while but also has not had any issues with his swelling. He is not sure if it from the Cymbalta or not.  He reports that he is also in a lot of back and neck pain and wants a Toradol injection today. Nothing has changed,this is normal course for pt in recent years.  Prior to Admission medications   Medication Sig Start Date End Date Taking? Authorizing Provider  ALPRAZolam Duanne Moron) 1 MG tablet TAKE 1 TABLET BY MOUTH THREE TIMES DAILYAS NEEDED. 05/23/16  Yes Jerrol Banana., MD  Ascorbic Acid (VITAMIN C PO) Take by mouth. am   Yes Historical Provider, MD  B Complex Vitamins (VITAMIN B COMPLEX PO) Take by mouth. am   Yes Historical Provider, MD  baclofen (LIORESAL) 10 MG tablet Take 5 mg by mouth 2 (two) times daily as needed. 05/22/16  Yes Historical Provider, MD  DULoxetine (CYMBALTA) 60 MG capsule Take 1 capsule (60 mg total) by mouth daily. 06/03/16  Yes Serena Petterson Maceo Pro., MD  etodolac (LODINE) 500 MG tablet Take 1 tablet (500 mg total) by mouth 2 (two) times daily. 06/03/16  Yes Duward Allbritton Maceo Pro., MD  fenofibrate (TRICOR) 145 MG tablet Take 1 tablet (145 mg total) by mouth daily. 06/03/16  Yes Preslie Depasquale Maceo Pro., MD  hydrochlorothiazide (HYDRODIURIL) 25 MG tablet TAKE 1 TABLET BY MOUTH EVERY DAY. 02/25/16  Yes Luisantonio Adinolfi Maceo Pro., MD  lansoprazole (PREVACID) 30 MG capsule Take 1 capsule (30 mg total) by mouth 2 (two) times daily. 12/18/15  Yes Beyla Loney Maceo Pro., MD  metoprolol succinate (TOPROL-XL) 50 MG 24 hr tablet TAKE 1 TABLET BY MOUTH EVERY DAY. 02/25/16  Yes Riko Lumsden Maceo Pro., MD  Multiple Vitamin (MULTIVITAMIN) capsule Take 1 capsule by mouth daily.   Yes Historical Provider, MD  oxyCODONE-acetaminophen (PERCOCET) 10-325 MG tablet Take 1 tablet by mouth every 4 (four) hours as needed for pain.   Yes Historical Provider, MD  promethazine (PHENERGAN) 25 MG tablet Take 1  tablet (25 mg total) by mouth every 8 (eight) hours as needed for nausea or vomiting. 06/03/16  Yes Jerrol Banana., MD  fentaNYL ([place] - DOSED MCG/HR) 50 MCG/HR Place onto the skin.  12/15/11   Historical Provider, MD  furosemide (LASIX) 40 MG tablet TAKE ONE TO TWO TABLETS IN THE MORNING AS NEEDED FOR SWELLING Patient not taking: Reported on 06/03/2016 10/19/14   Jerrol Banana., MD  venlafaxine (EFFEXOR) 75 MG tablet Take 1 tablet (75 mg total) by mouth QID. Patient not taking: Reported on 06/08/2016 08/20/15   Jerrol Banana., MD    Patient Active Problem List   Diagnosis Date Noted  . Hypokalemia 05/26/2016  . Salicylate overdose [id number]  . Skull lesion 03/05/2016  . Hypogammaglobulinemia ([place]) 12/19/2015  . BPH (benign prostatic hypertrophy) 11/11/2015  . Personal history of colonic polyps   . Disease of colon   . Benign neoplasm of descending colon   . Benign neoplasm of sigmoid colon   . Rhabdomyolysis 11/20/2014  . Absolute anemia 09/22/2014  . Anxiety 09/22/2014  . 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 Back pain, chronic 09/22/2014  . Chronic cervical pain 09/22/2014  . Clinical depression 09/22/2014  . Acid reflux 09/22/2014  . HLD (hyperlipidemia) 09/22/2014  . BP (high blood pressure) 09/22/2014  . Iron deficiency 09/22/2014  . Disease characterized  by destruction of skeletal muscle 09/22/2014  . Apnea, sleep 09/22/2014  . Umbilical hernia without obstruction and without gangrene 09/22/2014    Past Medical History:  Diagnosis Date  . Arthritis    lower back  . Confusion caused by a drug ([place])    fentanyl and oxycodone  . GERD (gastroesophageal reflux disease)   . Headache    past hx, secondary to nerve damage from accident  . Hypertension    CONTROLLED ON MEDS  . 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 Sleep apnea    USES C-PAP    Social History   Social History  . Marital status:  Divorced    Spouse name: single  . Number of children: 0  . Years of education: 12   Occupational History  . disability    Social History Main Topics  . Smoking status: Light Tobacco Smoker    Packs/day: 0.25    Years: 20.00    Types: Cigarettes  . Smokeless tobacco: Never Used     Comment: Had quit for 8 yrs. Father died 2023-01-09. has had several cigarettes last few days.  . Alcohol use No  . Drug use: No  . Sexual activity: No   Other Topics Concern  . Not on file   Social History Narrative  . No narrative on file    Allergies  Allergen Reactions  . Acetaminophen   . Codeine Itching and Nausea And Vomiting  . Opana [Oxymorphone] Swelling    Review of Systems  Constitutional: Positive for malaise/fatigue.  HENT: Negative.   Eyes: Negative.   Respiratory: Positive for shortness of breath.   Cardiovascular: Negative.   Gastrointestinal: Negative.   Genitourinary: Negative.   Musculoskeletal: Positive for back pain.  Neurological: Positive for headaches.  Endo/Heme/Allergies: Negative.   Psychiatric/Behavioral: The patient is nervous/anxious.     Immunization History  Administered Date(s) Administered  . Influenza,inj,Quad PF,36+ Mos 02/20/2015, 05/26/2016  . Pneumococcal Polysaccharide-23 05/26/2016  . Tdap 07/31/2008    Objective:  BP 140/74 (BP Location: Left Arm, Patient Position: Sitting, Cuff Size: Normal)   Pulse 76   Temp 98.3 F (36.8 C) (Oral)   Resp 18   Wt 185 lb (83.9 kg)   SpO2 94%   BMI 28.13 kg/m   Physical Exam  Constitutional: He is oriented to person, place, and time and well-developed, well-nourished, and in no distress.  Eyes: Conjunctivae and EOM are normal. Pupils are equal, round, and reactive to light.  Neck: Normal range of motion. Neck supple.  Cardiovascular: Normal rate, regular rhythm, normal heart sounds and intact distal pulses.   Pulmonary/Chest: Effort normal and breath sounds normal.  Musculoskeletal: Normal range of  motion. He exhibits edema (trace bilaterally ).  Neurological: He is alert and oriented to person, place, and time. He has normal reflexes. Gait normal. GCS score is 15.  Skin: Skin is warm and dry.  Psychiatric: Mood, memory, affect and judgment normal.    Lab Results  Component Value Date   WBC 8.0 05/25/2016   HGB 13.5 05/25/2016   HCT 39.0 (L) 05/25/2016   PLT 253 05/25/2016   GLUCOSE 77 05/25/2016   CHOL 242 (H) 11/11/2015   TRIG 198 (H) 11/11/2015   HDL 33 (L) 11/11/2015   LDLCALC 169 (H) 11/11/2015   TSH 0.757 11/11/2015   INR 1.30 05/25/2016   HGBA1C 5.4 11/11/2015    CMP     Component Value Date/Time  NA 140 05/25/2016 1009   NA 139 11/11/2015 1529   NA 135 (L) 12/12/2011 1740   K 3.2 (L) 05/26/2016 0449   K 3.0 (L) 12/12/2011 1740   CL 104 05/25/2016 1009   CL 94 (L) 12/12/2011 1740   CO2 29 05/25/2016 1009   CO2 34 (H) 12/12/2011 1740   GLUCOSE 77 05/25/2016 1009   GLUCOSE 112 (H) 12/12/2011 1740   BUN 14 05/25/2016 1009   BUN 9 11/11/2015 1529   BUN 9 12/12/2011 1740   CREATININE 0.61 05/25/2016 1009   CREATININE 1.09 12/12/2011 1740   CALCIUM 8.2 (L) 05/25/2016 1009   CALCIUM 8.8 12/12/2011 1740   PROT 6.0 (L) 05/25/2016 0600   PROT 6.6 11/11/2015 1529   PROT 6.9 12/12/2011 1740   ALBUMIN 3.5 05/25/2016 0600   ALBUMIN 4.3 11/11/2015 1529   ALBUMIN 3.3 (L) 12/12/2011 1740   AST 22 05/25/2016 0600   AST 83 (H) 12/12/2011 1740   ALT 10 (L) 05/25/2016 0600   ALT 73 12/12/2011 1740   ALKPHOS 36 (L) 05/25/2016 0600   ALKPHOS 44 (L) 12/12/2011 1740   BILITOT 0.5 05/25/2016 0600   BILITOT <0.2 11/11/2015 1529   BILITOT 0.3 12/12/2011 1740   GFRNONAA >60 05/25/2016 1009   GFRNONAA >60 12/12/2011 1740   GFRAA >60 05/25/2016 1009   GFRAA >60 12/12/2011 1740    Assessment and Plan :  1. Nonintractable headache, unspecified chronicity pattern, unspecified headache type  - ketorolac (TORADOL) injection 60 mg; Inject 2 mLs (60 mg total) into the  muscle once.  2. Chronic back pain, unspecified back location, unspecified back pain laterality  - ketorolac (TORADOL) injection 60 mg; Inject 2 mLs (60 mg total) into the muscle once.  3. Anxiety 4.Depression 5.MCI  HPI, Exam, and A&P Transcribed under the direction and in the presence of Klee Kolek L. Cranford Mon, MD  Electronically Signed: Katina Dung, CMA I have done the exam and reviewed the above chart and it is accurate to the best of my knowledge. Development worker, community has been used in this note in any air is in the dictation or transcription are unintentional.  [facility] 06/08/2016 3:45 PM

## 2016-06-08 NOTE — Telephone Encounter (Signed)
Pt called c/o severe headache s/p concussion. Also states his ankles are swollen. Headache is causing him imbalance. Denies facial droopiness. Advised pt to go to ED, but he refused. Appointment made for 3:15 pm. Pt does agree to go to ED if sx worsen. Renaldo Fiddler, CMA

## 2016-06-11 ENCOUNTER — Telehealth: Payer: Self-pay | Admitting: Emergency Medicine

## 2016-06-11 NOTE — Telephone Encounter (Signed)
Has not called about his letter for POA yet but I just remembered it. If you will tell me what you would like it to say I will get it typed up for you to sign. Thanks.    Joshua Guerrero (pt cousin that was with him at this [place]) [phone]

## 2016-06-16 NOTE — Telephone Encounter (Signed)
Ok--please do==thx

## 2016-06-17 NOTE — Telephone Encounter (Signed)
Tried to call pt. No VM set up. Called to tell him letter is ready for pick up. Informed daughter that it was ready. Pt had a change phone number to [phone]

## 2016-06-17 NOTE — Telephone Encounter (Signed)
done

## 2016-06-23 ENCOUNTER — Ambulatory Visit: Payer: Self-pay | Admitting: Family Medicine

## 2016-06-25 DIAGNOSIS — G894 Chronic pain syndrome: Secondary | ICD-10-CM | POA: Diagnosis not present

## 2016-06-25 DIAGNOSIS — M47812 Spondylosis without myelopathy or radiculopathy, cervical region: Secondary | ICD-10-CM | POA: Diagnosis not present

## 2016-06-25 DIAGNOSIS — M47817 Spondylosis without myelopathy or radiculopathy, lumbosacral region: Secondary | ICD-10-CM | POA: Diagnosis not present

## 2016-06-25 DIAGNOSIS — M961 Postlaminectomy syndrome, not elsewhere classified: Secondary | ICD-10-CM | POA: Diagnosis not present

## 2016-07-08 ENCOUNTER — Ambulatory Visit: Payer: Self-pay | Admitting: Family Medicine

## 2016-07-09 ENCOUNTER — Ambulatory Visit (INDEPENDENT_AMBULATORY_CARE_PROVIDER_SITE_OTHER): Payer: PPO | Admitting: Family Medicine

## 2016-07-09 ENCOUNTER — Encounter: Payer: Self-pay | Admitting: Family Medicine

## 2016-07-09 VITALS — BP 130/62 | HR 64 | Temp 98.8°F | Resp 16 | Wt 194.0 lb

## 2016-07-09 DIAGNOSIS — Z720 Tobacco use: Secondary | ICD-10-CM

## 2016-07-09 DIAGNOSIS — M549 Dorsalgia, unspecified: Secondary | ICD-10-CM

## 2016-07-09 DIAGNOSIS — E876 Hypokalemia: Secondary | ICD-10-CM | POA: Diagnosis not present

## 2016-07-09 DIAGNOSIS — F339 Major depressive disorder, recurrent, unspecified: Secondary | ICD-10-CM

## 2016-07-09 DIAGNOSIS — R51 Headache: Secondary | ICD-10-CM

## 2016-07-09 DIAGNOSIS — G8929 Other chronic pain: Secondary | ICD-10-CM

## 2016-07-09 DIAGNOSIS — R519 Headache, unspecified: Secondary | ICD-10-CM

## 2016-07-09 MED ORDER — KETOROLAC TROMETHAMINE 60 MG/2ML IM SOLN
60.0000 mg | Freq: Once | INTRAMUSCULAR | Status: AC
  Administered 2016-07-09: 60 mg via INTRAMUSCULAR

## 2016-07-09 MED ORDER — DULOXETINE HCL 30 MG PO CPEP: 90.0000 mg | ORAL_CAPSULE | Freq: Every day | ORAL | 5 refills | Status: DC

## 2016-07-09 MED ORDER — POTASSIUM CHLORIDE ER 10 MEQ PO TBCR: 10.0000 meq | EXTENDED_RELEASE_TABLET | Freq: Every day | ORAL | 5 refills | Status: DC

## 2016-07-09 MED ORDER — PROMETHAZINE HCL 25 MG PO TABS: 25.0000 mg | ORAL_TABLET | Freq: Three times a day (TID) | ORAL | 5 refills | Status: DC | PRN

## 2016-07-09 NOTE — Progress Notes (Signed)
Joshua Guerrero  MRN: [id number] DOB: 1956/02/25  Subjective:  HPI  Patient is here for 1 month follow up on depression. Effexor was stopped due to how patient felt on it-felt numb to things. Cymbalta was started and patient states he is feeling better on this medication and he is having thoughts of interest to do things unlike on Effexor. Has more emotion and tolerating it better. Patient would like to see if he can get Phenergan RX but for about 30 or 35 tablets to give him enough at least for 1 tablet daily in case. Last RX was written for 20 tablets. He is paying out of pocket for this due to insurance will not cover. Patient would like to know what causes Potassium level to go down besides using too many BCCs. Does he need to be on a RX supplement.  Patient would like to get a Toradol injection. It is a 9 on the scale of 1 to 10 of severity. Depression screen [facility] 2/9 07/09/2016 11/20/2014  Decreased Interest 1 0  Down, Depressed, Hopeless 1 0  PHQ - 2 Score 2 0  Altered sleeping 2 -  Tired, decreased energy 2 -  Change in appetite 0 -  Feeling bad or failure about yourself  0 -  Trouble concentrating 3 -  Moving slowly or fidgety/restless 3 -  Suicidal thoughts 0 -  PHQ-9 Score 12 -   Patient Active Problem List   Diagnosis Date Noted  . Hypokalemia 05/26/2016  . Salicylate overdose 57/05/7791  . Skull lesion 03/05/2016  . Hypogammaglobulinemia ([place]) 12/19/2015  . BPH (benign prostatic hypertrophy) 11/11/2015  . Personal history of colonic polyps   . Disease of colon   . Benign neoplasm of descending colon   . Benign neoplasm of sigmoid colon   . Rhabdomyolysis 11/20/2014  . Absolute anemia 09/22/2014  . Anxiety 09/22/2014  . 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 Back pain, chronic 09/22/2014  . Chronic cervical pain 09/22/2014  . Clinical depression 09/22/2014  . Acid reflux 09/22/2014  . HLD (hyperlipidemia) 09/22/2014  . BP (high blood pressure) 09/22/2014  .  Iron deficiency 09/22/2014  . Disease characterized by destruction of skeletal muscle 09/22/2014  . Apnea, sleep 09/22/2014  . Umbilical hernia without obstruction and without gangrene 09/22/2014    Past Medical History:  Diagnosis Date  . Arthritis    lower back  . Confusion caused by a drug ([place])    fentanyl and oxycodone  . GERD (gastroesophageal reflux disease)   . Headache    past hx, secondary to nerve damage from accident  . Hypertension    CONTROLLED ON MEDS  . 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 Sleep apnea    USES C-PAP    Social History   Social History  . Marital status: Divorced    Spouse name: single  . Number of children: 0  . Years of education: 12   Occupational History  . disability    Social History Main Topics  . Smoking status: Light Tobacco Smoker    Packs/day: 0.25    Years: 20.00    Types: Cigarettes  . Smokeless tobacco: Never Used     Comment: Had quit for 8 yrs. Father died January 11, 2023. has had several cigarettes last few days.  . Alcohol use No  . Drug use: No  . Sexual activity: No   Other Topics Concern  . Not on file  Social History Narrative  . No narrative on file    Outpatient Encounter Prescriptions as of 07/09/2016  Medication Sig  . ALPRAZolam (XANAX) 1 MG tablet TAKE 1 TABLET BY MOUTH THREE TIMES DAILYAS NEEDED.  . Ascorbic Acid (VITAMIN C PO) Take by mouth. am  . B Complex Vitamins (VITAMIN B COMPLEX PO) Take by mouth. am  . baclofen (LIORESAL) 10 MG tablet Take 5 mg by mouth 2 (two) times daily as needed.  . DULoxetine (CYMBALTA) 60 MG capsule Take 1 capsule (60 mg total) by mouth daily.  Marland Kitchen etodolac (LODINE) 500 MG tablet Take 1 tablet (500 mg total) by mouth 2 (two) times daily.  . fenofibrate (TRICOR) 145 MG tablet Take 1 tablet (145 mg total) by mouth daily.  . fentaNYL (DURAGESIC - DOSED MCG/HR) 50 MCG/HR Place onto the skin.   . furosemide (LASIX) 40 MG tablet TAKE ONE TO  TWO TABLETS IN THE MORNING AS NEEDED FOR SWELLING  . hydrochlorothiazide (HYDRODIURIL) 25 MG tablet TAKE 1 TABLET BY MOUTH EVERY DAY.  Marland Kitchen lansoprazole (PREVACID) 30 MG capsule Take 1 capsule (30 mg total) by mouth 2 (two) times daily.  . metoprolol succinate (TOPROL-XL) 50 MG 24 hr tablet TAKE 1 TABLET BY MOUTH EVERY DAY.  . Multiple Vitamin (MULTIVITAMIN) capsule Take 1 capsule by mouth daily.  Marland Kitchen oxyCODONE-acetaminophen (PERCOCET) 10-325 MG tablet Take 1 tablet by mouth every 4 (four) hours as needed for pain.  . promethazine (PHENERGAN) 25 MG tablet Take 1 tablet (25 mg total) by mouth every 8 (eight) hours as needed for nausea or vomiting.  . [DISCONTINUED] venlafaxine (EFFEXOR) 75 MG tablet Take 1 tablet (75 mg total) by mouth QID. (Patient not taking: Reported on 06/08/2016)   No facility-administered encounter medications on file as of 07/09/2016.     Allergies  Allergen Reactions  . Acetaminophen   . Codeine Itching and Nausea And Vomiting  . Opana [Oxymorphone] Swelling    Review of Systems  Constitutional: Positive for malaise/fatigue.  Respiratory: Negative.   Cardiovascular: Negative.   Gastrointestinal: Positive for nausea.  Musculoskeletal: Positive for back pain, joint pain, myalgias and neck pain.  Skin: Negative.   Neurological: Positive for tingling (left leg-numbness) and headaches. Negative for dizziness.  Endo/Heme/Allergies: Negative.   Psychiatric/Behavioral: Positive for depression. The patient is nervous/anxious.     Objective:  BP 130/62   Pulse 64   Temp 98.8 F (37.1 C)   Resp 16   Wt 194 lb (88 kg)   BMI 29.50 kg/m   Physical Exam  Constitutional: He is oriented to person, place, and time and well-developed, well-nourished, and in no distress.  HENT:  Head: Normocephalic and atraumatic.  Right Ear: External ear normal.  Left Ear: External ear normal.  Nose: Nose normal.  Eyes: Conjunctivae are normal. Pupils are equal, round, and reactive to  light.  Neck: No thyromegaly present.  Cardiovascular: Normal rate, regular rhythm, normal heart sounds and intact distal pulses.   No murmur heard. Pulmonary/Chest: Effort normal and breath sounds normal. No respiratory distress. He has no wheezes.  Abdominal: Soft.  Musculoskeletal: He exhibits edema.  1+ LE edema.  Lymphadenopathy:    He has no cervical adenopathy.  Neurological: He is alert and oriented to person, place, and time.  Skin: Skin is warm and dry.  Psychiatric: Mood and affect normal.    Assessment and Plan :  1. Nonintractable headache, unspecified chronicity pattern, unspecified headache type Toradol injection given today. - ketorolac (TORADOL) injection 60 mg; Inject  2 mLs (60 mg total) into the muscle once.  2. Chronic back pain, unspecified back location, unspecified back pain laterality follows specialist.  3. Episode of recurrent major depressive disorder, unspecified depression episode severity ([place]) Better. PHQ 9 score today 12. INcrease Cymbalta to 90 mg. - DULoxetine (CYMBALTA) 30 MG capsule; Take 3 capsules (90 mg total) by mouth daily.  Dispense: 90 capsule; Refill: 5  4. Low potassium syndrome Stop HCTZ in case this is affecting his potassium level. Also start [place] Con 10 meq daily for now. Follow. May need to re start HCTZ or another b/p medication. Follow.  5. Tobacco use Patient advised to quit.  HPI, Exam and A&P transcribed under direction and in the presence of Miguel Aschoff, MD. I have done the exam and reviewed the chart and it is accurate to the best of my knowledge. Development worker, community has been used and  any errors in dictation or transcription are unintentional. Miguel Aschoff M.D. [facility]

## 2016-07-20 ENCOUNTER — Telehealth: Payer: Self-pay | Admitting: Family Medicine

## 2016-07-20 ENCOUNTER — Other Ambulatory Visit: Payer: Self-pay | Admitting: Family Medicine

## 2016-07-20 DIAGNOSIS — F419 Anxiety disorder, unspecified: Secondary | ICD-10-CM

## 2016-07-20 NOTE — Telephone Encounter (Signed)
Pt requesting a refill of ALPRAZolam (XANAX) 1 MG tablet be called [facility] today if possible.  States he is out.

## 2016-07-20 NOTE — Telephone Encounter (Signed)
This was approved before patient called. RX called to the pharmacy. Pharmacist will let patient know when RX is ready for pick up-aa

## 2016-07-22 ENCOUNTER — Ambulatory Visit: Payer: PPO | Admitting: Family Medicine

## 2016-07-24 DIAGNOSIS — M47812 Spondylosis without myelopathy or radiculopathy, cervical region: Secondary | ICD-10-CM | POA: Diagnosis not present

## 2016-07-24 DIAGNOSIS — Z79891 Long term (current) use of opiate analgesic: Secondary | ICD-10-CM | POA: Diagnosis not present

## 2016-07-24 DIAGNOSIS — G894 Chronic pain syndrome: Secondary | ICD-10-CM | POA: Diagnosis not present

## 2016-07-24 DIAGNOSIS — M961 Postlaminectomy syndrome, not elsewhere classified: Secondary | ICD-10-CM | POA: Diagnosis not present

## 2016-07-24 DIAGNOSIS — M47817 Spondylosis without myelopathy or radiculopathy, lumbosacral region: Secondary | ICD-10-CM | POA: Diagnosis not present

## 2016-07-30 ENCOUNTER — Ambulatory Visit: Payer: Self-pay | Admitting: Family Medicine

## 2016-08-03 ENCOUNTER — Ambulatory Visit: Payer: PPO | Admitting: Family Medicine

## 2016-08-20 DIAGNOSIS — M47812 Spondylosis without myelopathy or radiculopathy, cervical region: Secondary | ICD-10-CM | POA: Diagnosis not present

## 2016-08-20 DIAGNOSIS — M961 Postlaminectomy syndrome, not elsewhere classified: Secondary | ICD-10-CM | POA: Diagnosis not present

## 2016-08-20 DIAGNOSIS — G894 Chronic pain syndrome: Secondary | ICD-10-CM | POA: Diagnosis not present

## 2016-08-20 DIAGNOSIS — M47817 Spondylosis without myelopathy or radiculopathy, lumbosacral region: Secondary | ICD-10-CM | POA: Diagnosis not present

## 2016-08-26 ENCOUNTER — Telehealth: Payer: Self-pay | Admitting: Family Medicine

## 2016-08-26 NOTE — Telephone Encounter (Signed)
Called Pt to schedule AWV with [facility] - knb °

## 2016-08-27 DIAGNOSIS — Z8659 Personal history of other mental and behavioral disorders: Secondary | ICD-10-CM | POA: Diagnosis not present

## 2016-08-27 DIAGNOSIS — R0781 Pleurodynia: Secondary | ICD-10-CM | POA: Diagnosis not present

## 2016-08-27 DIAGNOSIS — S3991XA Unspecified injury of abdomen, initial encounter: Secondary | ICD-10-CM | POA: Diagnosis not present

## 2016-08-27 DIAGNOSIS — G8929 Other chronic pain: Secondary | ICD-10-CM | POA: Diagnosis not present

## 2016-08-27 DIAGNOSIS — F0781 Postconcussional syndrome: Secondary | ICD-10-CM | POA: Diagnosis not present

## 2016-08-27 DIAGNOSIS — R109 Unspecified abdominal pain: Secondary | ICD-10-CM | POA: Diagnosis not present

## 2016-09-10 ENCOUNTER — Ambulatory Visit: Payer: PPO | Admitting: Family Medicine

## 2016-09-18 DIAGNOSIS — M47817 Spondylosis without myelopathy or radiculopathy, lumbosacral region: Secondary | ICD-10-CM | POA: Diagnosis not present

## 2016-09-18 DIAGNOSIS — G894 Chronic pain syndrome: Secondary | ICD-10-CM | POA: Diagnosis not present

## 2016-09-18 DIAGNOSIS — M47812 Spondylosis without myelopathy or radiculopathy, cervical region: Secondary | ICD-10-CM | POA: Diagnosis not present

## 2016-09-18 DIAGNOSIS — M961 Postlaminectomy syndrome, not elsewhere classified: Secondary | ICD-10-CM | POA: Diagnosis not present

## 2016-09-29 DIAGNOSIS — M47817 Spondylosis without myelopathy or radiculopathy, lumbosacral region: Secondary | ICD-10-CM | POA: Diagnosis not present

## 2016-10-02 DIAGNOSIS — I619 Nontraumatic intracerebral hemorrhage, unspecified: Secondary | ICD-10-CM

## 2016-10-02 HISTORY — DX: Nontraumatic intracerebral hemorrhage, unspecified: I61.9

## 2016-10-16 DIAGNOSIS — G894 Chronic pain syndrome: Secondary | ICD-10-CM | POA: Diagnosis not present

## 2016-10-16 DIAGNOSIS — M47817 Spondylosis without myelopathy or radiculopathy, lumbosacral region: Secondary | ICD-10-CM | POA: Diagnosis not present

## 2016-10-16 DIAGNOSIS — M961 Postlaminectomy syndrome, not elsewhere classified: Secondary | ICD-10-CM | POA: Diagnosis not present

## 2016-10-16 DIAGNOSIS — M47812 Spondylosis without myelopathy or radiculopathy, cervical region: Secondary | ICD-10-CM | POA: Diagnosis not present

## 2016-11-10 ENCOUNTER — Other Ambulatory Visit: Payer: Self-pay | Admitting: Family Medicine

## 2016-11-10 DIAGNOSIS — F419 Anxiety disorder, unspecified: Secondary | ICD-10-CM

## 2016-11-11 ENCOUNTER — Other Ambulatory Visit: Payer: Self-pay | Admitting: Family Medicine

## 2016-11-11 DIAGNOSIS — F419 Anxiety disorder, unspecified: Secondary | ICD-10-CM

## 2016-11-12 ENCOUNTER — Other Ambulatory Visit: Payer: Self-pay | Admitting: Family Medicine

## 2016-11-12 DIAGNOSIS — M961 Postlaminectomy syndrome, not elsewhere classified: Secondary | ICD-10-CM | POA: Diagnosis not present

## 2016-11-12 DIAGNOSIS — M47812 Spondylosis without myelopathy or radiculopathy, cervical region: Secondary | ICD-10-CM | POA: Diagnosis not present

## 2016-11-12 DIAGNOSIS — M47817 Spondylosis without myelopathy or radiculopathy, lumbosacral region: Secondary | ICD-10-CM | POA: Diagnosis not present

## 2016-11-12 DIAGNOSIS — F419 Anxiety disorder, unspecified: Secondary | ICD-10-CM

## 2016-11-12 DIAGNOSIS — M5481 Occipital neuralgia: Secondary | ICD-10-CM | POA: Diagnosis not present

## 2016-11-12 DIAGNOSIS — G894 Chronic pain syndrome: Secondary | ICD-10-CM | POA: Diagnosis not present

## 2016-11-12 NOTE — Telephone Encounter (Signed)
Pt needs new refill on his alprazolam 1mg   [facility]  He is coming in on Monday  Pt's call back [phone]  Thanks teri

## 2016-11-16 ENCOUNTER — Ambulatory Visit: Payer: Self-pay | Admitting: Family Medicine

## 2016-11-16 ENCOUNTER — Other Ambulatory Visit: Payer: Self-pay | Admitting: Family Medicine

## 2016-11-16 DIAGNOSIS — F419 Anxiety disorder, unspecified: Secondary | ICD-10-CM

## 2016-11-16 NOTE — Telephone Encounter (Signed)
Pt contacted office for refill request on the following medications:  ALPRAZolam (XANAX) 1 MG tablet.  [facility].  CB#[phone]/MW

## 2016-11-18 ENCOUNTER — Ambulatory Visit (INDEPENDENT_AMBULATORY_CARE_PROVIDER_SITE_OTHER): Payer: PPO | Admitting: Family Medicine

## 2016-11-18 DIAGNOSIS — F419 Anxiety disorder, unspecified: Secondary | ICD-10-CM | POA: Diagnosis not present

## 2016-11-18 MED ORDER — DESVENLAFAXINE SUCCINATE ER 50 MG PO TB24: 50.0000 mg | ORAL_TABLET | Freq: Every day | ORAL | 1 refills | Status: DC

## 2016-11-18 MED ORDER — ALPRAZOLAM 0.5 MG PO TABS: 0.5000 mg | ORAL_TABLET | Freq: Three times a day (TID) | ORAL | 1 refills | Status: DC | PRN

## 2016-11-18 MED ORDER — ALPRAZOLAM 0.5 MG PO TABS: 1.0000 mg | ORAL_TABLET | Freq: Three times a day (TID) | ORAL | 1 refills | Status: DC | PRN

## 2016-11-18 NOTE — Progress Notes (Signed)
Joshua Guerrero  MRN: [id number] DOB: Oct 21, 1955  Subjective:  HPI  Patient is here for follow up. Last office visit was on 07/09/16 Depression: Cymbalata was increased to 90 mg. PHQ9 score was 12. Patient states he noticed some improvement with increased dose. Depression screen [facility] 2/9 11/18/2016 07/09/2016 11/20/2014  Decreased Interest 2 1 0  Down, Depressed, Hopeless 2 1 0  PHQ - 2 Score 4 2 0  Altered sleeping 2 2 -  Tired, decreased energy 2 2 -  Change in appetite 2 0 -  Feeling bad or failure about yourself  2 0 -  Trouble concentrating 2 3 -  Moving slowly or fidgety/restless 0 3 -  Suicidal thoughts 0 0 -  PHQ-9 Score 14 12 -   Potassium level was low on 05/26/16-3.2. On last visit patient was told to stop HCTZ and start [place] and to follow. BP Readings from Last 3 Encounters:  11/18/16 126/64  07/09/16 130/62  06/08/16 140/74   Patient needs refill on Xanax today. This medication helps him sleep and he has been out about 1 week now. Patient Active Problem List   Diagnosis Date Noted  . Hypokalemia 05/26/2016  . Salicylate overdose 10/28/9483  . Skull lesion 03/05/2016  . Hypogammaglobulinemia ([place]) 12/19/2015  . BPH (benign prostatic hypertrophy) 11/11/2015  . Personal history of colonic polyps   . Disease of colon   . Benign neoplasm of descending colon   . Benign neoplasm of sigmoid colon   . Rhabdomyolysis 11/20/2014  . Absolute anemia 09/22/2014  . Anxiety 09/22/2014  . 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 Back pain, chronic 09/22/2014  . Chronic cervical pain 09/22/2014  . Clinical depression 09/22/2014  . Acid reflux 09/22/2014  . HLD (hyperlipidemia) 09/22/2014  . BP (high blood pressure) 09/22/2014  . Iron deficiency 09/22/2014  . Disease characterized by destruction of skeletal muscle 09/22/2014  . Apnea, sleep 09/22/2014  . Umbilical hernia without obstruction and without gangrene 09/22/2014    Past Medical History:  Diagnosis Date  .  Arthritis    lower back  . Confusion caused by a drug ([place])    fentanyl and oxycodone  . GERD (gastroesophageal reflux disease)   . Headache    past hx, secondary to nerve damage from accident  . Hypertension    CONTROLLED ON MEDS  . 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 Sleep apnea    USES C-PAP    Social History   Social History  . Marital status: Divorced    Spouse name: single  . Number of children: 0  . Years of education: 12   Occupational History  . disability    Social History Main Topics  . Smoking status: Light Tobacco Smoker    Packs/day: 0.25    Years: 20.00    Types: Cigarettes  . Smokeless tobacco: Never Used     Comment: Had quit for 8 yrs. Father died 2022-12-25. has had several cigarettes last few days.  . Alcohol use No  . Drug use: No  . Sexual activity: No   Other Topics Concern  . Not on file   Social History Narrative  . No narrative on file    Outpatient Encounter Prescriptions as of 11/18/2016  Medication Sig  . Ascorbic Acid (VITAMIN C PO) Take by mouth. am  . B Complex Vitamins (VITAMIN B COMPLEX PO) Take by mouth. am  . baclofen (LIORESAL) 10 MG  tablet Take 5 mg by mouth 2 (two) times daily as needed.  . DULoxetine (CYMBALTA) 30 MG capsule Take 3 capsules (90 mg total) by mouth daily.  . fenofibrate (TRICOR) 145 MG tablet Take 1 tablet (145 mg total) by mouth daily.  . fentaNYL (DURAGESIC - DOSED MCG/HR) 50 MCG/HR Place onto the skin.   Marland Kitchen lansoprazole (PREVACID) 30 MG capsule Take 1 capsule (30 mg total) by mouth 2 (two) times daily.  . metoprolol succinate (TOPROL-XL) 50 MG 24 hr tablet TAKE 1 TABLET BY MOUTH EVERY DAY.  . Multiple Vitamin (MULTIVITAMIN) capsule Take 1 capsule by mouth daily.  Marland Kitchen oxyCODONE-acetaminophen (PERCOCET) 10-325 MG tablet Take 1 tablet by mouth every 4 (four) hours as needed for pain.  . potassium chloride (K-DUR) 10 MEQ tablet Take 1 tablet (10 mEq total) by mouth  daily.  . promethazine (PHENERGAN) 25 MG tablet Take 1 tablet (25 mg total) by mouth every 8 (eight) hours as needed for nausea or vomiting.  Marland Kitchen ALPRAZolam (XANAX) 1 MG tablet TAKE 1 TABLET BY MOUTH 3 TIMES A DAY AS NEEDED (Patient not taking: Reported on 11/18/2016)  . etodolac (LODINE) 500 MG tablet Take 1 tablet (500 mg total) by mouth 2 (two) times daily. (Patient not taking: Reported on 11/18/2016)  . furosemide (LASIX) 40 MG tablet TAKE ONE TO TWO TABLETS IN THE MORNING AS NEEDED FOR SWELLING (Patient not taking: Reported on 07/09/2016)   No facility-administered encounter medications on file as of 11/18/2016.     Allergies  Allergen Reactions  . Acetaminophen   . Codeine Itching and Nausea And Vomiting  . Opana [Oxymorphone] Swelling    Review of Systems  Constitutional: Positive for malaise/fatigue.  Respiratory: Negative.   Cardiovascular: Negative.   Gastrointestinal: Positive for nausea (sometimes).  Musculoskeletal: Positive for back pain and joint pain.  Neurological: Positive for tingling (numbness).  Psychiatric/Behavioral: Positive for depression. The patient has insomnia.     Objective:  BP 126/64   Pulse 62   Temp 98.9 F (37.2 C)   Resp 14   Wt 191 lb (86.6 kg)   BMI 29.04 kg/m   Physical Exam  Constitutional: He is oriented to person, place, and time and well-developed, well-nourished, and in no distress.  HENT:  Head: Normocephalic and atraumatic.  Eyes: Conjunctivae are normal. No scleral icterus.  Neck: No thyromegaly present.  Cardiovascular: Normal rate, regular rhythm and normal heart sounds.   Pulmonary/Chest: Effort normal.  Abdominal: Soft.  Neurological: He is alert and oriented to person, place, and time. Gait normal. GCS score is 15.  Skin: Skin is warm and dry.  Psychiatric: Mood, memory, affect and judgment normal.    Assessment and Plan :  MDD Change Cymbalta to Pristiq. Anxiety Check urine drug screen on next OV as when pt overdosed  several months ago his drug screen was negative for meds that he has prescribed. Chronic Pain Per pain clinic.  I have done the exam and reviewed the chart and it is accurate to the best of my knowledge. Development worker, community has been used and  any errors in dictation or transcription are unintentional. Miguel Aschoff M.D. [facility]

## 2016-12-11 DIAGNOSIS — M47812 Spondylosis without myelopathy or radiculopathy, cervical region: Secondary | ICD-10-CM | POA: Diagnosis not present

## 2016-12-11 DIAGNOSIS — G894 Chronic pain syndrome: Secondary | ICD-10-CM | POA: Diagnosis not present

## 2016-12-11 DIAGNOSIS — M961 Postlaminectomy syndrome, not elsewhere classified: Secondary | ICD-10-CM | POA: Diagnosis not present

## 2016-12-11 DIAGNOSIS — M47817 Spondylosis without myelopathy or radiculopathy, lumbosacral region: Secondary | ICD-10-CM | POA: Diagnosis not present

## 2016-12-17 ENCOUNTER — Other Ambulatory Visit: Payer: Self-pay | Admitting: Family Medicine

## 2016-12-17 DIAGNOSIS — I10 Essential (primary) hypertension: Secondary | ICD-10-CM

## 2016-12-17 DIAGNOSIS — M5412 Radiculopathy, cervical region: Secondary | ICD-10-CM

## 2016-12-17 DIAGNOSIS — F419 Anxiety disorder, unspecified: Secondary | ICD-10-CM

## 2016-12-21 ENCOUNTER — Other Ambulatory Visit: Payer: Self-pay | Admitting: Family Medicine

## 2016-12-21 ENCOUNTER — Other Ambulatory Visit: Payer: Self-pay

## 2016-12-21 NOTE — Patient Outreach (Signed)
[place] [facility]) Care Management  12/21/2016  Joshua Guerrero [birth year] [id number]   Telephone Screen  Referral Date: 12/21/16 Referral Source: MD office(Dr. Rosanna Randy) Referral Reason: "having difficulties with med appts and is less than 73 yhrs of age, also needs medication education as he has questions regarding meds, also reports high level of anxiety related to primary diagnosis" Insurance: HTA   Outreach attempt #1 to patient.  A male answered the phone and when asked to speak with patient reported "not available" and hung up.      Plan: RN CM will make outreach attempt to patient within one week.   Enzo Montgomery, RN,BSN,CCM [place] Management Telephonic Care Management Coordinator Direct Phone: [phone] Toll Free: [phone] Fax: [phone]

## 2016-12-22 ENCOUNTER — Other Ambulatory Visit: Payer: Self-pay

## 2016-12-22 NOTE — Patient Outreach (Signed)
[name]) Care Management  12/22/2016  Joshua Guerrero 29-Jan-1956 [id number]   Telephone Screen  Referral Date: 12/21/16 Referral Source: MD office(Dr. Rosanna Randy) Referral Reason: "having difficulties with med appts and is less than 26 yhrs of age, also needs medication education as he has questions regarding meds, also reports high level of anxiety related to primary diagnosis" Insurance: HTA     Voicemail message received from patient. Return call placed to patient. No answer at present. RN CM unable to leave voicemail as mailbox full.       Plan: RN CM will make outreach attempt to patient within a week if no return call from patient.     Enzo Montgomery, RN,BSN,CCM [place] Management Telephonic Care Management Coordinator Direct Phone: [phone] Toll Free: [phone] Fax: [phone]

## 2016-12-23 ENCOUNTER — Encounter: Payer: Self-pay | Admitting: Family Medicine

## 2016-12-23 ENCOUNTER — Ambulatory Visit (INDEPENDENT_AMBULATORY_CARE_PROVIDER_SITE_OTHER): Payer: PPO | Admitting: Family Medicine

## 2016-12-23 ENCOUNTER — Other Ambulatory Visit: Payer: Self-pay

## 2016-12-23 VITALS — BP 124/60 | HR 62 | Temp 98.7°F | Resp 18 | Wt 193.0 lb

## 2016-12-23 DIAGNOSIS — F419 Anxiety disorder, unspecified: Secondary | ICD-10-CM

## 2016-12-23 DIAGNOSIS — Z23 Encounter for immunization: Secondary | ICD-10-CM

## 2016-12-23 DIAGNOSIS — F339 Major depressive disorder, recurrent, unspecified: Secondary | ICD-10-CM | POA: Diagnosis not present

## 2016-12-23 DIAGNOSIS — Z1159 Encounter for screening for other viral diseases: Secondary | ICD-10-CM

## 2016-12-23 DIAGNOSIS — Z0283 Encounter for blood-alcohol and blood-drug test: Secondary | ICD-10-CM | POA: Diagnosis not present

## 2016-12-23 DIAGNOSIS — R519 Headache, unspecified: Secondary | ICD-10-CM

## 2016-12-23 DIAGNOSIS — R51 Headache: Secondary | ICD-10-CM | POA: Diagnosis not present

## 2016-12-23 DIAGNOSIS — G4709 Other insomnia: Secondary | ICD-10-CM | POA: Diagnosis not present

## 2016-12-23 DIAGNOSIS — G8929 Other chronic pain: Secondary | ICD-10-CM | POA: Diagnosis not present

## 2016-12-23 DIAGNOSIS — F1190 Opioid use, unspecified, uncomplicated: Secondary | ICD-10-CM | POA: Diagnosis not present

## 2016-12-23 DIAGNOSIS — M549 Dorsalgia, unspecified: Secondary | ICD-10-CM

## 2016-12-23 DIAGNOSIS — K429 Umbilical hernia without obstruction or gangrene: Secondary | ICD-10-CM | POA: Diagnosis not present

## 2016-12-23 DIAGNOSIS — R5383 Other fatigue: Secondary | ICD-10-CM | POA: Diagnosis not present

## 2016-12-23 DIAGNOSIS — E7849 Other hyperlipidemia: Secondary | ICD-10-CM

## 2016-12-23 DIAGNOSIS — E784 Other hyperlipidemia: Secondary | ICD-10-CM | POA: Diagnosis not present

## 2016-12-23 MED ORDER — KETOROLAC TROMETHAMINE 60 MG/2ML IM SOLN
60.0000 mg | Freq: Once | INTRAMUSCULAR | Status: AC
  Administered 2016-12-23: 60 mg via INTRAMUSCULAR

## 2016-12-23 NOTE — Patient Outreach (Signed)
[place] [facility]) Care Management  12/23/2016  Joshua Guerrero 1955-12-27 [id number]   Telephone Screen  Referral Date: 12/21/16 Referral Source: MD office(Dr. Rosanna Randy) Referral Reason: "having difficulties with med appts and is less than 45 yhrs of age, also needs medication education as he has questions regarding meds, also reports high level of anxiety related to primary diagnosis" Insurance: HTA   Outreach attempt to patient. No answer at present. RN CM unable to leave voicemail as mailbox full.     Plan: RN CM will attempt outreach call to patient within a week.   Enzo Montgomery, RN,BSN,CCM [place] Management Telephonic Care Management Coordinator Direct Phone: [phone] Toll Free: [phone] Fax: [phone]

## 2016-12-23 NOTE — Progress Notes (Signed)
Referral sent via [facility]
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
## 2016-12-24 LAB — PAIN MGT SCRN (14 DRUGS), UR
AMPHETAMINE SCREEN URINE: NEGATIVE ng/mL
BARBITURATE SCREEN URINE: NEGATIVE ng/mL
BENZODIAZEPINE SCREEN, URINE: POSITIVE ng/mL — AB
Buprenorphine, Urine: NEGATIVE ng/mL
CANNABINOIDS UR QL SCN: NEGATIVE ng/mL
COCAINE(METAB.)SCREEN, URINE: NEGATIVE ng/mL
Creatinine(Crt), U: 86.6 mg/dL (ref 20.0–300.0)
FENTANYL, URINE: POSITIVE pg/mL — AB
MEPERIDINE SCREEN, URINE: NEGATIVE ng/mL
Methadone Screen, Urine: NEGATIVE ng/mL
OPIATE SCREEN URINE: NEGATIVE ng/mL
OXYCODONE+OXYMORPHONE UR QL SCN: POSITIVE ng/mL — AB
PHENCYCLIDINE QUANTITATIVE URINE: NEGATIVE ng/mL
PROPOXYPHENE SCREEN URINE: NEGATIVE ng/mL
Ph of Urine: 5.3 (ref 4.5–8.9)
TRAMADOL SCREEN, URINE: NEGATIVE ng/mL

## 2016-12-28 ENCOUNTER — Other Ambulatory Visit: Payer: Self-pay

## 2016-12-28 ENCOUNTER — Other Ambulatory Visit: Payer: Self-pay | Admitting: *Deleted

## 2016-12-28 NOTE — Patient Outreach (Signed)
[place] [facility]) Care Management  12/28/2016  Joshua Guerrero 01-Feb-1956 [id number]    Telephone Screen  Referral Date: 12/21/16 Referral Source: MD office(Dr. Rosanna Randy) Referral Reason: "having difficulties with med appts and is less than 57 yhrs of age, also needs medication education as he has questions regarding meds, also reports high level of anxiety related to primary diagnosis" Insurance: HTA    Outreach attempt # 3  to patient. No answer. RN CM left HIPAA compliant voicemail message along with contact info.     Plan: RN CM will send unsuccessful outreach letter to patient and close case if no response within 10 business days.    Enzo Montgomery, RN,BSN,CCM [place] Management Telephonic Care Management Coordinator Direct Phone: [phone] Toll Free: [phone] Fax: [phone]
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
## 2016-12-29 ENCOUNTER — Other Ambulatory Visit: Payer: Self-pay | Admitting: Pharmacist

## 2016-12-29 NOTE — Patient Outreach (Addendum)
[place] [facility]) Care Management  [name]   12/29/2016  Joshua Guerrero Mar 25, 1956 [id number]   61 year old male referred to [place] Management by Dr. Rosanna Randy at [facility].  [place] services requested for medication education. PMHx includes, but not limited to, BPH, anxiety, depression, chronic back and cervical pain, HLD, HTN, current smoker, and umbilical hernia.   Unsuccessful outreach attempt #1 to patient today.  I left a HIPAA compliant voicemail.   Plan: Call patient again later this week unless I hear back from his beforehand.   Ralene Bathe, PharmD, [place] [phone]   Addendum: I received a phone-call back from patient and returned his call.   I left a HIPAA compliant voicemail again.   Plan: Call patient again later this week if I do not hear back prior.   Ralene Bathe, PharmD, [place] [phone]    s

## 2016-12-30 ENCOUNTER — Telehealth: Payer: Self-pay

## 2016-12-30 NOTE — Telephone Encounter (Signed)
-----   Message from Jerrol Banana., MD sent at 12/27/2016  2:19 PM EDT ----- Appropriate drug screen for pt Rx.

## 2016-12-30 NOTE — Telephone Encounter (Signed)
LMTCB 12/30/2016  Thanks,   -Mickel Baas

## 2016-12-30 NOTE — Telephone Encounter (Signed)
Pt advised.   Thanks,   -Gwendolin Briel  

## 2016-12-31 ENCOUNTER — Telehealth: Payer: Self-pay | Admitting: Pharmacist

## 2016-12-31 ENCOUNTER — Ambulatory Visit: Payer: Self-pay | Admitting: Pharmacist

## 2016-12-31 NOTE — Patient Outreach (Signed)
[facility]) Care Management  12/31/2016  Joshua Guerrero August 29, 1955 [id number]  Unsuccessful outreach attempt to patient today.  I was unable to leave a HIPAA compliant voicemail as the patient's phone number listed is not accepting messages at this time.   Plan: I will reach out to patient again next week regarding medication reconciliation and medication questions.   Ralene Bathe, PharmD, [place] [phone]

## 2017-01-04 ENCOUNTER — Other Ambulatory Visit: Payer: Self-pay | Admitting: Family Medicine

## 2017-01-04 DIAGNOSIS — F419 Anxiety disorder, unspecified: Secondary | ICD-10-CM

## 2017-01-05 ENCOUNTER — Other Ambulatory Visit: Payer: Self-pay | Admitting: Pharmacist

## 2017-01-05 ENCOUNTER — Ambulatory Visit: Payer: Self-pay | Admitting: Pharmacist

## 2017-01-05 ENCOUNTER — Other Ambulatory Visit: Payer: Self-pay

## 2017-01-05 DIAGNOSIS — F419 Anxiety disorder, unspecified: Secondary | ICD-10-CM

## 2017-01-05 MED ORDER — ALPRAZOLAM 1 MG PO TABS: 1.0000 mg | ORAL_TABLET | Freq: Three times a day (TID) | ORAL | 5 refills | Status: DC | PRN

## 2017-01-05 NOTE — Patient Outreach (Signed)
[place] [facility]) Care Management  01/05/2017  Joshua Guerrero 1956/01/02 [id number]  61 year old male referred to [place] Management by Dr. Rosanna Randy at [facility].  [place] services requested for medication education. PMHx includes, but not limited to, BPH, anxiety, depression, chronic back and cervical pain, HLD, HTN, current smoker, and umbilical hernia.   Third unsuccessful outreach call to [name] today.  I was unable to leave a voicemail as the number listed for [name] currently has call restrictions and did not ring at all for patient to answer.    Plan: I will mail patient a letter describing [facility] services.  I will follow-up with patient again in 10 business days and close case at that time if no response.    Ralene Bathe, PharmD, [place] [phone]

## 2017-01-05 NOTE — Telephone Encounter (Signed)
Dr Rosanna Randy you told patient after urine drug screen we would increase his xanax back to 1 mg TID. Please review-aa

## 2017-01-11 ENCOUNTER — Other Ambulatory Visit: Payer: Self-pay

## 2017-01-11 DIAGNOSIS — M5416 Radiculopathy, lumbar region: Secondary | ICD-10-CM | POA: Diagnosis not present

## 2017-01-11 DIAGNOSIS — G894 Chronic pain syndrome: Secondary | ICD-10-CM | POA: Diagnosis not present

## 2017-01-11 DIAGNOSIS — M791 Myalgia: Secondary | ICD-10-CM | POA: Diagnosis not present

## 2017-01-11 DIAGNOSIS — M5481 Occipital neuralgia: Secondary | ICD-10-CM | POA: Diagnosis not present

## 2017-01-11 DIAGNOSIS — M1712 Unilateral primary osteoarthritis, left knee: Secondary | ICD-10-CM | POA: Diagnosis not present

## 2017-01-11 DIAGNOSIS — M961 Postlaminectomy syndrome, not elsewhere classified: Secondary | ICD-10-CM | POA: Diagnosis not present

## 2017-01-11 DIAGNOSIS — M47812 Spondylosis without myelopathy or radiculopathy, cervical region: Secondary | ICD-10-CM | POA: Diagnosis not present

## 2017-01-11 DIAGNOSIS — K5900 Constipation, unspecified: Secondary | ICD-10-CM | POA: Diagnosis not present

## 2017-01-11 DIAGNOSIS — M47817 Spondylosis without myelopathy or radiculopathy, lumbosacral region: Secondary | ICD-10-CM | POA: Diagnosis not present

## 2017-01-11 DIAGNOSIS — Z79891 Long term (current) use of opiate analgesic: Secondary | ICD-10-CM | POA: Diagnosis not present

## 2017-01-11 DIAGNOSIS — M62830 Muscle spasm of back: Secondary | ICD-10-CM | POA: Diagnosis not present

## 2017-01-11 NOTE — Addendum Note (Signed)
Addended by: Jiles Harold on: 01/11/2017 08:57 AM   Modules accepted: Miquel Dunn

## 2017-01-11 NOTE — Patient Outreach (Signed)
[place] [facility]) Care Management  01/11/2017  Joshua Guerrero Mar 09, 1956 [id number]   Telephone Screen  Referral Date: 12/21/16 Referral Source: MD office(Dr. Rosanna Randy) Referral Reason: "having difficulties with med appts and is less than 88 yrs of age, also needs medication education as he has questions regarding meds, also reports high level of anxiety related to primary diagnosis" Insurance: HTA  Multiple attempts to establish contact with patient without success. No response from letter mailed to patient. Case is being closed at this time.    Plan: RN CM will notify [facility] administrative assistant of case status.   Enzo Montgomery, RN,BSN,CCM [place] Management Telephonic Care Management Coordinator Direct Phone: [phone] Toll Free: [phone] Fax: [phone]

## 2017-01-11 NOTE — Addendum Note (Signed)
Addended by: Jiles Harold on: 01/11/2017 08:53 AM   Modules accepted: Level of Service, SmartSet

## 2017-01-11 NOTE — Progress Notes (Signed)
This encounter was created in error - please disregard.

## 2017-01-12 ENCOUNTER — Ambulatory Visit: Payer: Self-pay | Admitting: General Surgery

## 2017-01-19 ENCOUNTER — Other Ambulatory Visit: Payer: Self-pay | Admitting: Pharmacist

## 2017-01-19 ENCOUNTER — Ambulatory Visit: Payer: Self-pay | Admitting: Pharmacist

## 2017-01-19 NOTE — Patient Outreach (Signed)
[place] [facility]) Care Management  01/19/2017  LORAIN KEAST 1955/07/11 [id number]   61year old malereferred to [place] Management by Dr. Rosanna Randy at [facility]. [place] services requested for medication education. PMHx includes, but not limited to, BPH, anxiety, depression, chronic back and cervical pain, HLD, HTN, current smoker, and umbilical hernia.   I have been unable to contact Mr. Pursley via telephone on multiple occasions or via mail.  [facility] pharmacy will close patient case at this time.  I am happy to assist in the future as needed.   Plan: I will contact [facility] CMA of case closure and  I will send physician a case closure letter.    Ralene Bathe, PharmD, [place] [phone]

## 2017-01-26 ENCOUNTER — Ambulatory Visit (INDEPENDENT_AMBULATORY_CARE_PROVIDER_SITE_OTHER): Payer: PPO | Admitting: General Surgery

## 2017-01-26 ENCOUNTER — Encounter: Payer: Self-pay | Admitting: General Surgery

## 2017-01-26 VITALS — BP 118/74 | HR 72 | Resp 14 | Ht 67.0 in | Wt 194.0 lb

## 2017-01-26 DIAGNOSIS — K429 Umbilical hernia without obstruction or gangrene: Secondary | ICD-10-CM

## 2017-01-26 NOTE — Progress Notes (Addendum)
Patient ID: Joshua Guerrero, male   DOB: 18-Feb-1956, 61 y.o.   MRN: [id number]  Chief Complaint  Patient presents with  . Follow-up    HPI Joshua Guerrero is a 61 y.o. male here today for a evaluation of a umbilical hernia . He noticed this area about a month ago. The area has got bigger. He states the area is painful with activity.  Moves his bowels every other day.Last colonoscopy was in 2016.   Cousin Gilmore Laroche is present at visit.  HPI  Past Medical History:  Diagnosis Date  . Arthritis    lower back  . Confusion caused by a drug ([place])    fentanyl and oxycodone  . GERD (gastroesophageal reflux disease)   . Headache    past hx, secondary to nerve damage from accident  . Hypertension    CONTROLLED ON MEDS  . 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 Sleep apnea    Not currently using a C-Pap    Past Surgical History:  Procedure Laterality Date  . COLONOSCOPY WITH PROPOFOL N/A 02/11/2015   Procedure: COLONOSCOPY WITH PROPOFOL;  Surgeon: Lucilla Lame, MD;  Location: [facility];  Service: Endoscopy;  Laterality: N/A;  USES C-PAP  . NASAL SINUS SURGERY     due to severe sleep apnea  . NECK SURGERY     diffuse disc-and put in possibly screws patient thinks  . SHOULDER SURGERY Bilateral    x 2    Family History  Problem Relation Age of Onset  . Dementia Father   . Cancer Mother   . Cancer Sister   . Cancer Maternal Aunt     Social History Social History  Substance Use Topics  . Smoking status: Light Tobacco Smoker    Packs/day: 0.25    Years: 20.00    Types: Cigarettes  . Smokeless tobacco: Never Used     Comment: Had quit for 8 yrs. Father died 2023/01/03. has had several cigarettes last few days.  . Alcohol use No    Allergies  Allergen Reactions  . Acetaminophen   . Codeine Itching and Nausea And Vomiting  . Opana [Oxymorphone] Swelling    Current Outpatient Prescriptions  Medication Sig Dispense Refill  .  ALPRAZolam (XANAX) 1 MG tablet Take 1 tablet (1 mg total) by mouth 3 (three) times daily as needed. 90 tablet 5  . Ascorbic Acid (VITAMIN C PO) Take by mouth. am    . B Complex Vitamins (VITAMIN B COMPLEX PO) Take by mouth. am    . baclofen (LIORESAL) 10 MG tablet Take 5 mg by mouth 2 (two) times daily as needed.    . desvenlafaxine (PRISTIQ) 50 MG 24 hr tablet Take 1 tablet (50 mg total) by mouth daily. 90 tablet 1  . etodolac (LODINE) 500 MG tablet Take 1 tablet (500 mg total) by mouth 2 (two) times daily. 60 tablet 3  . fenofibrate (TRICOR) 145 MG tablet Take 1 tablet (145 mg total) by mouth daily. 30 tablet 12  . fentaNYL (DURAGESIC - DOSED MCG/HR) 50 MCG/HR Place onto the skin.     . furosemide (LASIX) 40 MG tablet TAKE ONE TO TWO TABLETS IN THE MORNING AS NEEDED FOR SWELLING 60 tablet 12  . lansoprazole (PREVACID) 30 MG capsule Take 1 capsule (30 mg total) by mouth 2 (two) times daily. 60 capsule 11  . lansoprazole (PREVACID) 30 MG capsule TAKE ONE CAPSULE BY MOUTH TWICE  A DAY. 60 capsule 5  . metoprolol succinate (TOPROL-XL) 50 MG 24 hr tablet TAKE 1 TABLET BY MOUTH EVERY DAY. 90 tablet 2  . Multiple Vitamin (MULTIVITAMIN) capsule Take 1 capsule by mouth daily.    Marland Kitchen oxyCODONE-acetaminophen (PERCOCET) 10-325 MG tablet Take 1 tablet by mouth every 4 (four) hours as needed for pain.    . potassium chloride (K-DUR) 10 MEQ tablet Take 1 tablet (10 mEq total) by mouth daily. 30 tablet 5  . promethazine (PHENERGAN) 25 MG tablet Take 1 tablet (25 mg total) by mouth every 8 (eight) hours as needed for nausea or vomiting. 35 tablet 5   No current facility-administered medications for this visit.     Review of Systems Review of Systems  Constitutional: Negative.   Respiratory: Negative.   Cardiovascular: Negative.     Blood pressure 118/74, pulse 72, resp. rate 14, height 5\' 7"  (1.702 m), weight 194 lb (88 kg).  Physical Exam Physical Exam  Constitutional: He is oriented to person, place,  and time. He appears well-developed and well-nourished.  HENT:  Head: Normocephalic and atraumatic.  Neck: Normal range of motion. Neck supple.  Cardiovascular: Normal rate and regular rhythm.   Pulmonary/Chest: Effort normal and breath sounds normal.  Abdominal: Soft. Normal appearance. There is no hepatomegaly. A hernia (1 cm umbilical hernia.) is present.    Neurological: He is alert and oriented to person, place, and time.  Skin: Skin is warm.    Data Reviewed Pain management drug screen showed no unexpected findings. CBC of 05/25/2016 showed a hemoglobin of 13.5 with an MCV of 91.5. White blood cell count of 8000.  Potassium of 05/26/2016 was low at 3.2.  Assessment    Umbilical hernia with small tab of nonreducible fat.    Plan         Hernia precautions and incarceration were discussed with the patient. If they develop symptoms of an incarcerated hernia, they were encouraged to seek prompt medical attention.  I have recommended repair of the hernia on an outpatient basis in the future. The risk of infection was reviewed. At this time, I don't believe that prosthetic mesh reinforcement will be required. The role of prosthetic mesh to minimize the risk of recurrence was reviewed.  HPI, Physical Exam, Assessment and Plan have been scribed under the direction and in the presence of Hervey Ard, MD.  Gaspar Cola, CMA  I have completed the exam and reviewed the above documentation for accuracy and completeness.  I agree with the above.  Haematologist has been used and any errors in dictation or transcription are unintentional.  Hervey Ard, M.D., F.A.C.S.  Robert Bellow 01/27/2017, 8:21 PM

## 2017-01-26 NOTE — Patient Instructions (Signed)
Umbilical Hernia, Adult A hernia is a bulge of tissue that pushes through an opening between muscles. An umbilical hernia happens in the abdomen, near the belly button (umbilicus). The hernia may contain tissues from the small intestine, large intestine, or fatty tissue covering the intestines (omentum). Umbilical hernias in adults tend to get worse over time, and they require surgical treatment. There are several types of umbilical hernias. You may have:  A hernia located just above or below the umbilicus (indirect hernia). This is the most common type of umbilical hernia in adults.  A hernia that forms through an opening formed by the umbilicus (direct hernia).  A hernia that comes and goes (reducible hernia). A reducible hernia may be visible only when you strain, lift something heavy, or cough. This type of hernia can be pushed back into the abdomen (reduced).  A hernia that traps abdominal tissue inside the hernia (incarcerated hernia). This type of hernia cannot be reduced.  A hernia that cuts off blood flow to the tissues inside the hernia (strangulated hernia). The tissues can start to die if this happens. This type of hernia requires emergency treatment.  What are the causes? An umbilical hernia happens when tissue inside the abdomen presses on a weak area of the abdominal muscles. What increases the risk? You may have a greater risk of this condition if you:  Are obese.  Have had several pregnancies.  Have a buildup of fluid inside your abdomen (ascites).  Have had surgery that weakens the abdominal muscles.  What are the signs or symptoms? The main symptom of this condition is a painless bulge at or near the belly button. A reducible hernia may be visible only when you strain, lift something heavy, or cough. Other symptoms may include:  Dull pain.  A feeling of pressure.  Symptoms of a strangulated hernia may include:  Pain that gets increasingly worse.  Nausea and  vomiting.  Pain when pressing on the hernia.  Skin over the hernia becoming red or purple.  Constipation.  Blood in the stool.  How is this diagnosed? This condition may be diagnosed based on:  A physical exam. You may be asked to cough or strain while standing. These actions increase the pressure inside your abdomen and force the hernia through the opening in your muscles. Your health care provider may try to reduce the hernia by pressing on it.  Your symptoms and medical history.  How is this treated? Surgery is the only treatment for an umbilical hernia. Surgery for a strangulated hernia is done as soon as possible. If you have a small hernia that is not incarcerated, you may need to lose weight before having surgery. Follow these instructions at home:  Lose weight, if told by your health care provider.  Do not try to push the hernia back in.  Watch your hernia for any changes in color or size. Tell your health care provider if any changes occur.  You may need to avoid activities that increase pressure on your hernia.  Do not lift anything that is heavier than 10 lb (4.5 kg) until your health care provider says that this is safe.  Take over-the-counter and prescription medicines only as told by your health care provider.  Keep all follow-up visits as told by your health care provider. This is important. Contact a health care provider if:  Your hernia gets larger.  Your hernia becomes painful. Get help right away if:  You develop sudden, severe pain near the   area of your hernia.  You have pain as well as nausea or vomiting.  You have pain and the skin over your hernia changes color.  You develop a fever. This information is not intended to replace advice given to you by your health care provider. Make sure you discuss any questions you have with your health care provider. Document Released: 09/20/2015 Document Revised: 12/22/2015 Document Reviewed:  09/20/2015 Elsevier Interactive Patient Education  2018 [employer]  

## 2017-01-28 ENCOUNTER — Ambulatory Visit: Payer: Self-pay | Admitting: General Surgery

## 2017-01-29 ENCOUNTER — Telehealth: Payer: Self-pay

## 2017-01-29 ENCOUNTER — Other Ambulatory Visit: Payer: Self-pay | Admitting: General Surgery

## 2017-01-29 DIAGNOSIS — K429 Umbilical hernia without obstruction or gangrene: Secondary | ICD-10-CM

## 2017-01-29 NOTE — Telephone Encounter (Signed)
Call received from patient's POA, Kim, to schedule his hernia repair. The patient is scheduled for surgery at [facility] on 02/26/17. He will pre admit by phone. The patient is aware of date and instructions.

## 2017-02-08 DIAGNOSIS — M961 Postlaminectomy syndrome, not elsewhere classified: Secondary | ICD-10-CM | POA: Diagnosis not present

## 2017-02-08 DIAGNOSIS — M47817 Spondylosis without myelopathy or radiculopathy, lumbosacral region: Secondary | ICD-10-CM | POA: Diagnosis not present

## 2017-02-08 DIAGNOSIS — M47812 Spondylosis without myelopathy or radiculopathy, cervical region: Secondary | ICD-10-CM | POA: Diagnosis not present

## 2017-02-08 DIAGNOSIS — G894 Chronic pain syndrome: Secondary | ICD-10-CM | POA: Diagnosis not present

## 2017-02-18 ENCOUNTER — Inpatient Hospital Stay: Admission: RE | Admit: 2017-02-18 | Payer: Self-pay | Source: Ambulatory Visit

## 2017-02-19 ENCOUNTER — Inpatient Hospital Stay: Admission: RE | Admit: 2017-02-19 | Payer: Self-pay | Source: Ambulatory Visit

## 2017-02-22 ENCOUNTER — Inpatient Hospital Stay: Admission: RE | Admit: 2017-02-22 | Payer: Self-pay | Source: Ambulatory Visit

## 2017-02-23 ENCOUNTER — Inpatient Hospital Stay: Admission: RE | Admit: 2017-02-23 | Payer: Self-pay | Source: Ambulatory Visit

## 2017-02-24 ENCOUNTER — Encounter
Admission: RE | Admit: 2017-02-24 | Discharge: 2017-02-24 | Disposition: A | Discharge status: Home / Self Care | Payer: PPO | Source: Ambulatory Visit | Attending: General Surgery | Admitting: General Surgery

## 2017-02-24 HISTORY — DX: Other chronic pain: G89.29

## 2017-02-24 HISTORY — DX: Cervicalgia: M54.2

## 2017-02-24 NOTE — Patient Instructions (Addendum)
  Your procedure is scheduled on: 02-26-17 FRIDAY Report to Same Day Surgery 2nd floor medical mall [facility] Entrance-take elevator on left to 2nd floor.  Check in with surgery information desk.) To find out your arrival time please call [phone] between 1PM - 3PM on 02-25-17 THURSDAY  Remember: Instructions that are not followed completely may result in serious medical risk, up to and including death, or upon the discretion of your surgeon and anesthesiologist your surgery may need to be rescheduled.    _x___ 1. Do not eat food after midnight the night before your procedure. NO GUM CHEWING OR HARD CANDIES.  You may drink clear liquids up to 2 hours before you are scheduled to arrive at the hospital for your procedure.  Do not drink clear liquids within 2 hours of your scheduled arrival to the hospital.  Clear liquids include  --Water or Apple juice without pulp  --Clear carbohydrate beverage such as ClearFast or Gatorade  --Black Coffee or Clear Tea (No milk, no creamers, do not add anything to the coffee or Tea)      __x__ 2. No Alcohol for 24 hours before or after surgery.   __x__3. No Smoking for 24 prior to surgery.   ____  4. Bring all medications with you on the day of surgery if instructed.    __x__ 5. Notify your doctor if there is any change in your medical condition     (cold, fever, infections).     Do not wear jewelry, make-up, hairpins, clips or nail polish.  Do not wear lotions, powders, or perfumes. You may wear deodorant.  Do not shave 48 hours prior to surgery. Men may shave face and neck.  Do not bring valuables to the hospital.    [facility] is not responsible for any belongings or valuables.               Contacts, dentures or bridgework may not be worn into surgery.  Leave your suitcase in the car. After surgery it may be brought to your room.  For patients admitted to the hospital, discharge time is determined by your treatment team.   Patients  discharged the day of surgery will not be allowed to drive home.  You will need someone to drive you home and stay with you the night of your procedure.    Please read over the following fact sheets that you were given:     _x___ [place] WITH A SMALL SIP OF WATER. These include:  1. PREVACID  2. FENOFIBRATE  3. METOPROLOL  4. PERCOCET  5.  6.  ____Fleets enema or Magnesium Citrate as directed.   _x___ Use CHG Soap or sage wipes as directed on instruction sheet   ____ Use inhalers on the day of surgery and bring to hospital day of surgery  ____ Stop Metformin and Janumet 2 days prior to surgery.    ____ Take 1/2 of usual insulin dose the night before surgery and none on the morning surgery.   ____ Follow recommendations from Cardiologist, Pulmonologist or PCP regarding stopping Aspirin, Coumadin, Plavix ,Eliquis, Effient, or Pradaxa, and Pletal.  X____Stop Anti-inflammatories such as Advil, Aleve, Ibuprofen, Motrin, Naproxen, Naprosyn, BC POWDERS or aspirin products NOW- OK to take PERCOCET   ____ Stop supplements until after surgery.    ____ Bring C-Pap to the hospital.

## 2017-02-25 ENCOUNTER — Encounter
Admission: RE | Admit: 2017-02-25 | Discharge: 2017-02-25 | Disposition: A | Discharge status: Home / Self Care | Payer: PPO | Source: Ambulatory Visit | Attending: General Surgery | Admitting: General Surgery

## 2017-02-25 ENCOUNTER — Other Ambulatory Visit: Payer: Self-pay

## 2017-02-25 DIAGNOSIS — K420 Umbilical hernia with obstruction, without gangrene: Secondary | ICD-10-CM | POA: Diagnosis not present

## 2017-02-25 DIAGNOSIS — Z79891 Long term (current) use of opiate analgesic: Secondary | ICD-10-CM | POA: Diagnosis not present

## 2017-02-25 DIAGNOSIS — F17210 Nicotine dependence, cigarettes, uncomplicated: Secondary | ICD-10-CM | POA: Diagnosis not present

## 2017-02-25 DIAGNOSIS — Z79899 Other long term (current) drug therapy: Secondary | ICD-10-CM | POA: Diagnosis not present

## 2017-02-25 DIAGNOSIS — G4730 Sleep apnea, unspecified: Secondary | ICD-10-CM | POA: Diagnosis not present

## 2017-02-25 DIAGNOSIS — K219 Gastro-esophageal reflux disease without esophagitis: Secondary | ICD-10-CM | POA: Diagnosis not present

## 2017-02-25 DIAGNOSIS — I10 Essential (primary) hypertension: Secondary | ICD-10-CM | POA: Diagnosis not present

## 2017-02-25 DIAGNOSIS — R410 Disorientation, unspecified: Secondary | ICD-10-CM | POA: Diagnosis not present

## 2017-02-25 HISTORY — DX: Nontraumatic intracerebral hemorrhage, unspecified: I61.9

## 2017-02-25 LAB — BASIC METABOLIC PANEL
Anion gap: 10 (ref 5–15)
BUN: 10 mg/dL (ref 6–20)
CALCIUM: 9.1 mg/dL (ref 8.9–10.3)
CHLORIDE: 104 mmol/L (ref 101–111)
CO2: 28 mmol/L (ref 22–32)
CREATININE: 0.8 mg/dL (ref 0.61–1.24)
GFR calc non Af Amer: >60 mL/min (ref >60)
Glucose, Bld: 88 mg/dL (ref 65–99)
Potassium: 3.8 mmol/L (ref 3.5–5.1)
SODIUM: 142 mmol/L (ref 135–145)

## 2017-02-25 MED ORDER — CEFAZOLIN SODIUM-DEXTROSE 2-4 GM/100ML-% IV SOLN
2.0000 g | INTRAVENOUS | Status: AC
  Administered 2017-02-26: 2 g via INTRAVENOUS

## 2017-02-26 ENCOUNTER — Ambulatory Visit
Admission: RE | Admit: 2017-02-26 | Discharge: 2017-02-26 | Disposition: A | Discharge status: Home / Self Care | Payer: PPO | Source: Ambulatory Visit | Attending: General Surgery | Admitting: General Surgery

## 2017-02-26 ENCOUNTER — Encounter
Admission: RE | Disposition: A | Discharge status: Home / Self Care | Payer: Self-pay | Source: Ambulatory Visit | Attending: General Surgery

## 2017-02-26 ENCOUNTER — Ambulatory Visit: Payer: PPO | Admitting: Anesthesiology

## 2017-02-26 DIAGNOSIS — K219 Gastro-esophageal reflux disease without esophagitis: Secondary | ICD-10-CM | POA: Diagnosis not present

## 2017-02-26 DIAGNOSIS — K420 Umbilical hernia with obstruction, without gangrene: Secondary | ICD-10-CM | POA: Insufficient documentation

## 2017-02-26 DIAGNOSIS — F17210 Nicotine dependence, cigarettes, uncomplicated: Secondary | ICD-10-CM | POA: Insufficient documentation

## 2017-02-26 DIAGNOSIS — Z79891 Long term (current) use of opiate analgesic: Secondary | ICD-10-CM | POA: Insufficient documentation

## 2017-02-26 DIAGNOSIS — G4730 Sleep apnea, unspecified: Secondary | ICD-10-CM | POA: Insufficient documentation

## 2017-02-26 DIAGNOSIS — I10 Essential (primary) hypertension: Secondary | ICD-10-CM | POA: Diagnosis not present

## 2017-02-26 DIAGNOSIS — K429 Umbilical hernia without obstruction or gangrene: Secondary | ICD-10-CM | POA: Diagnosis not present

## 2017-02-26 DIAGNOSIS — R410 Disorientation, unspecified: Secondary | ICD-10-CM | POA: Insufficient documentation

## 2017-02-26 DIAGNOSIS — Z79899 Other long term (current) drug therapy: Secondary | ICD-10-CM | POA: Insufficient documentation

## 2017-02-26 HISTORY — PX: UMBILICAL HERNIA REPAIR: SHX196

## 2017-02-26 SURGERY — REPAIR, HERNIA, UMBILICAL, ADULT
Anesthesia: General

## 2017-02-26 MED ORDER — MIDAZOLAM HCL 2 MG/2ML IJ SOLN
INTRAMUSCULAR | Status: DC | PRN
  Administered 2017-02-26: 2 mg via INTRAVENOUS

## 2017-02-26 MED ORDER — CEFAZOLIN SODIUM-DEXTROSE 2-4 GM/100ML-% IV SOLN
INTRAVENOUS | Status: AC
  Filled 2017-02-26: qty 100

## 2017-02-26 MED ORDER — LIDOCAINE HCL (CARDIAC) 20 MG/ML IV SOLN
INTRAVENOUS | Status: DC | PRN
  Administered 2017-02-26: 50 mg via INTRAVENOUS

## 2017-02-26 MED ORDER — MIDAZOLAM HCL 2 MG/2ML IJ SOLN
INTRAMUSCULAR | Status: AC
  Filled 2017-02-26: qty 2

## 2017-02-26 MED ORDER — ONDANSETRON HCL 4 MG/2ML IJ SOLN: 4.0000 mg | Freq: Once | INTRAMUSCULAR | Status: DC | PRN

## 2017-02-26 MED ORDER — FENTANYL CITRATE (PF) 100 MCG/2ML IJ SOLN
INTRAMUSCULAR | Status: DC | PRN
  Administered 2017-02-26: 50 ug via INTRAVENOUS

## 2017-02-26 MED ORDER — ONDANSETRON HCL 4 MG/2ML IJ SOLN
INTRAMUSCULAR | Status: AC
  Filled 2017-02-26: qty 2

## 2017-02-26 MED ORDER — LACTATED RINGERS IV SOLN
INTRAVENOUS | Status: DC
  Administered 2017-02-26: 10:00:00 via INTRAVENOUS

## 2017-02-26 MED ORDER — OXYCODONE HCL 5 MG PO TABS
ORAL_TABLET | ORAL | Status: AC
  Administered 2017-02-26: 5 mg via ORAL
  Filled 2017-02-26: qty 1

## 2017-02-26 MED ORDER — OXYCODONE-ACETAMINOPHEN 5-325 MG PO TABS
1.0000 | ORAL_TABLET | Freq: Once | ORAL | Status: AC
  Administered 2017-02-26: 1 via ORAL

## 2017-02-26 MED ORDER — OXYCODONE-ACETAMINOPHEN 5-325 MG PO TABS
ORAL_TABLET | ORAL | Status: AC
  Administered 2017-02-26: 1 via ORAL
  Filled 2017-02-26: qty 1

## 2017-02-26 MED ORDER — FENTANYL CITRATE (PF) 100 MCG/2ML IJ SOLN
25.0000 ug | INTRAMUSCULAR | Status: DC | PRN
Start: 2017-02-26 — End: 2017-02-26

## 2017-02-26 MED ORDER — BUPIVACAINE HCL (PF) 0.5 % IJ SOLN
INTRAMUSCULAR | Status: AC
  Filled 2017-02-26: qty 30

## 2017-02-26 MED ORDER — PROPOFOL 10 MG/ML IV BOLUS
INTRAVENOUS | Status: DC | PRN
  Administered 2017-02-26: 200 mg via INTRAVENOUS

## 2017-02-26 MED ORDER — PROPOFOL 10 MG/ML IV BOLUS
INTRAVENOUS | Status: AC
  Filled 2017-02-26: qty 20

## 2017-02-26 MED ORDER — DEXAMETHASONE SODIUM PHOSPHATE 10 MG/ML IJ SOLN
INTRAMUSCULAR | Status: AC
  Filled 2017-02-26: qty 1

## 2017-02-26 MED ORDER — OXYCODONE-ACETAMINOPHEN 10-325 MG PO TABS: 1.0000 | ORAL_TABLET | ORAL | 0 refills | Status: AC | PRN

## 2017-02-26 MED ORDER — FENTANYL CITRATE (PF) 100 MCG/2ML IJ SOLN
INTRAMUSCULAR | Status: AC
  Filled 2017-02-26: qty 2

## 2017-02-26 MED ORDER — BUPIVACAINE HCL 0.5 % IJ SOLN
INTRAMUSCULAR | Status: DC | PRN
  Administered 2017-02-26: 30 mL

## 2017-02-26 MED ORDER — OXYCODONE HCL 5 MG PO TABS
5.0000 mg | ORAL_TABLET | Freq: Once | ORAL | Status: AC
  Administered 2017-02-26: 5 mg via ORAL
  Filled 2017-02-26: qty 1

## 2017-02-26 SURGICAL SUPPLY — 29 items
BLADE SURG 15 STRL SS SAFETY (BLADE) ×2 IMPLANT
CANISTER SUCT 1200ML W/VALVE (MISCELLANEOUS) ×2 IMPLANT
CHLORAPREP W/TINT 26ML (MISCELLANEOUS) ×2 IMPLANT
DRAPE LAPAROTOMY 100X77 ABD (DRAPES) ×2 IMPLANT
DRSG TEGADERM 4X4.75 (GAUZE/BANDAGES/DRESSINGS) ×2 IMPLANT
DRSG TELFA 4X3 1S NADH ST (GAUZE/BANDAGES/DRESSINGS) ×2 IMPLANT
ELECT REM PT RETURN 9FT ADLT (ELECTROSURGICAL) ×2
ELECTRODE REM PT RTRN 9FT ADLT (ELECTROSURGICAL) ×1 IMPLANT
GLOVE BIO SURGEON STRL SZ7.5 (GLOVE) ×2 IMPLANT
GLOVE INDICATOR 8.0 STRL GRN (GLOVE) ×2 IMPLANT
GOWN STRL REUS W/ TWL LRG LVL3 (GOWN DISPOSABLE) ×2 IMPLANT
GOWN STRL REUS W/TWL LRG LVL3 (GOWN DISPOSABLE) ×2
KIT RM TURNOVER STRD PROC AR (KITS) ×2 IMPLANT
LABEL OR SOLS (LABEL) ×2 IMPLANT
NDL SAFETY 22GX1.5 (NEEDLE) ×4 IMPLANT
NEEDLE HYPO 25X1 1.5 SAFETY (NEEDLE) ×2 IMPLANT
NS IRRIG 500ML POUR BTL (IV SOLUTION) ×2 IMPLANT
PACK BASIN MINOR ARMC (MISCELLANEOUS) ×2 IMPLANT
STRIP CLOSURE SKIN 1/2X4 (GAUZE/BANDAGES/DRESSINGS) ×2 IMPLANT
SUT PROLENE 0 CT 1 30 (SUTURE) ×2 IMPLANT
SUT SURGILON 0 BLK (SUTURE) ×4 IMPLANT
SUT VIC AB 3-0 54X BRD REEL (SUTURE) ×2 IMPLANT
SUT VIC AB 3-0 BRD 54 (SUTURE) ×2
SUT VIC AB 3-0 SH 27 (SUTURE) ×1
SUT VIC AB 3-0 SH 27X BRD (SUTURE) ×1 IMPLANT
SUT VIC AB 4-0 FS2 27 (SUTURE) ×2 IMPLANT
SWABSTK COMLB BENZOIN TINCTURE (MISCELLANEOUS) ×2 IMPLANT
SYR 3ML LL SCALE MARK (SYRINGE) ×2 IMPLANT
SYR CONTROL 10ML (SYRINGE) ×4 IMPLANT

## 2017-02-26 NOTE — H&P (Signed)
Joshua Guerrero [id number] 1955/09/24     HPI: 61 y.o male with umbilical hernia with incarcerated fat. For repair.   Prescriptions Prior to Admission  Medication Sig Dispense Refill Last Dose  . ALPRAZolam (XANAX) 1 MG tablet Take 1 tablet (1 mg total) by mouth 3 (three) times daily as needed. (Patient taking differently: Take 1 mg by mouth 2 (two) times daily. 1 tab at 9pm and another at 11pm if the first xanax doesn't work) 90 tablet 5 02/25/2017 at Unknown time  . Ascorbic Acid (VITAMIN C PO) Take by mouth. am   02/25/2017 at Unknown time  . B Complex Vitamins (VITAMIN B COMPLEX PO) Take by mouth. am   02/25/2017 at Unknown time  . baclofen (LIORESAL) 10 MG tablet Take 5 mg by mouth 3 (three) times daily. STARTING AT LUNCHTIME   02/25/2017 at Unknown time  . desvenlafaxine (PRISTIQ) 50 MG 24 hr tablet Take 1 tablet (50 mg total) by mouth daily. (Patient taking differently: Take 50 mg by mouth every morning. ) 90 tablet 1 02/26/2017 at 0500  . fentaNYL (DURAGESIC - DOSED MCG/HR) 50 MCG/HR Place 50 mcg onto the skin every 3 (three) days.    02/25/2017 at Unknown time  . furosemide (LASIX) 40 MG tablet TAKE ONE TO TWO TABLETS IN THE MORNING AS NEEDED FOR SWELLING 60 tablet 12 02/25/2017 at Unknown time  . metoprolol succinate (TOPROL-XL) 50 MG 24 hr tablet TAKE 1 TABLET BY MOUTH EVERY DAY. (Patient taking differently: TAKE 1 TABLET BY MOUTH EVERY DAY-AM) 90 tablet 2 02/25/2017 at Unknown time  . Multiple Vitamin (MULTIVITAMIN) capsule Take 1 capsule by mouth daily.   02/25/2017 at Unknown time  . oxyCODONE-acetaminophen (PERCOCET) 10-325 MG tablet Take 1 tablet by mouth every 4 (four) hours. 3AM, 7AM, 11AM, 3 PM, 7PM AND 11PM   02/26/2017 at 0500  . potassium chloride (K-DUR) 10 MEQ tablet Take 1 tablet (10 mEq total) by mouth daily. 30 tablet 5 02/25/2017 at Unknown time  . promethazine (PHENERGAN) 25 MG tablet Take 1 tablet (25 mg total) by mouth every 8 (eight) hours as needed for nausea or  vomiting. 35 tablet 5 02/25/2017 at Unknown time  . Aspirin-Salicylamide-Caffeine (BC HEADACHE POWDER PO) Take 1 tablet by mouth as needed.    02/24/2017  . etodolac (LODINE) 500 MG tablet Take 1 tablet (500 mg total) by mouth 2 (two) times daily. (Patient not taking: Reported on 02/25/2017) 60 tablet 3 Not Taking at Unknown time  . fenofibrate (TRICOR) 145 MG tablet Take 1 tablet (145 mg total) by mouth daily. (Patient not taking: Reported on 02/26/2017) 30 tablet 12 Not Taking at Unknown time  . lansoprazole (PREVACID) 30 MG capsule Take 1 capsule (30 mg total) by mouth 2 (two) times daily. 60 capsule 11 Taking   Allergies  Allergen Reactions  . Acetaminophen Shortness Of Breath  . Codeine Itching and Nausea And Vomiting  . Opana [Oxymorphone] Swelling   Past Medical History:  Diagnosis Date  . Arthritis    lower back  . Brain bleed ([place]) 10/2016   AT [facility]-TREATED MEDICALLY NO SURGERY  . Confusion caused by a drug ([place])    fentanyl and oxycodone  . GERD (gastroesophageal reflux disease)   . Headache    past hx, secondary to nerve damage from accident  . Hypertension    CONTROLLED ON MEDS  . Neck pain, chronic   . 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  Sleep apnea    Not currently using a C-Pap   Past Surgical History:  Procedure Laterality Date  . COLONOSCOPY WITH PROPOFOL N/A 02/11/2015   Procedure: COLONOSCOPY WITH PROPOFOL;  Surgeon: Lucilla Lame, MD;  Location: [facility];  Service: Endoscopy;  Laterality: N/A;  USES C-PAP  . NASAL SINUS SURGERY     due to severe sleep apnea  . NECK SURGERY     diffuse disc-and put in possibly screws patient thinks  . SHOULDER SURGERY Bilateral    x 2   Social History   Social History  . Marital status: Divorced    Spouse name: single  . Number of children: 0  . Years of education: 12   Occupational History  . disability    Social History Main Topics  . Smoking status:  Heavy Tobacco Smoker    Packs/day: 1.00    Years: 20.00    Types: Cigarettes  . Smokeless tobacco: Never Used     Comment: Had quit for 8 yrs. Father died 2022-12-29. has had several cigarettes last few days.  . Alcohol use No  . Drug use: No  . Sexual activity: No   Other Topics Concern  . Not on file   Social History Narrative  . No narrative on file   Social History   Social History Narrative  . No narrative on file     ROS: Negative.     PE: HEENT: Negative. Lungs: Clear. Cardio: RR.  Assessment/Plan:  Proceed with planned umbilical hernia repair.    Robert Bellow 02/26/2017
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
## 2017-02-26 NOTE — Transfer of Care (Signed)
Immediate Anesthesia Transfer of Care Note  Patient: Joshua Guerrero  Procedure(s) Performed: HERNIA REPAIR UMBILICAL ADULT (N/A )  Patient Location: PACU  Anesthesia Type:General  Level of Consciousness: sedated and responds to stimulation  Airway & Oxygen Therapy: Patient Spontanous Breathing and Patient connected to face mask oxygen  Post-op Assessment: Report given to RN and Post -op Vital signs reviewed and stable  Post vital signs: Reviewed and stable  Last Vitals:  Vitals:   02/26/17 1021 02/26/17 1133  BP: 139/76 128/74  Pulse: 70 (!) 54  Resp: 20 (!) 5  Temp: 36.8 C (!) 36.2 C  SpO2: 97% 99%    Last Pain:  Vitals:   02/26/17 1021  TempSrc: Oral  PainSc: 9          Complications: No apparent anesthesia complications

## 2017-02-26 NOTE — Discharge Instructions (Signed)

## 2017-02-26 NOTE — Anesthesia Preprocedure Evaluation (Signed)
Anesthesia Evaluation  Patient identified by MRN, date of birth, ID band Patient awake    Reviewed: Allergy & Precautions, H&P , NPO status , Patient's Chart, lab work & pertinent test results, reviewed documented beta blocker date and time   Airway Mallampati: II  TM Distance: >3 FB Neck ROM: full    Dental  (+) Teeth Intact   Pulmonary neg pulmonary ROS, sleep apnea , Current Smoker,    Pulmonary exam normal        Cardiovascular Exercise Tolerance: Good hypertension, negative cardio ROS Normal cardiovascular exam Rate:Normal     Neuro/Psych  Headaches, PSYCHIATRIC DISORDERS  Neuromuscular disease negative neurological ROS  negative psych ROS   GI/Hepatic negative GI ROS, Neg liver ROS, GERD  Medicated,  Endo/Other  negative endocrine ROS  Renal/GU negative Renal ROS  negative genitourinary   Musculoskeletal   Abdominal   Peds  Hematology negative hematology ROS (+) anemia ,   Anesthesia Other Findings   Reproductive/Obstetrics negative OB ROS                             Anesthesia Physical Anesthesia Plan  ASA: III  Anesthesia Plan: General LMA   Post-op Pain Management:    Induction:   PONV Risk Score and Plan: 2 and Ondansetron and Dexamethasone  Airway Management Planned:   Additional Equipment:   Intra-op Plan:   Post-operative Plan:   Informed Consent: I have reviewed the patients History and Physical, chart, labs and discussed the procedure including the risks, benefits and alternatives for the proposed anesthesia with the patient or authorized representative who has indicated his/her understanding and acceptance.     Plan Discussed with: CRNA  Anesthesia Plan Comments:         Anesthesia Quick Evaluation

## 2017-02-26 NOTE — Anesthesia Post-op Follow-up Note (Signed)
Anesthesia QCDR form completed.        

## 2017-02-26 NOTE — Op Note (Signed)
Preoperative diagnosis: Umbilical hernia with incarcerated fat.  Postoperative diagnosis: Same.  Operative procedure: Umbilical hernia repair.  Operating Surgeon: Loa Socks, MD.  Anesthesia: General by LMA, Marcaine 0.5%, plain, 30 cc.  Estimated blood loss: Less than 5 cc.  Clinical note: This 61 year old male was admitted for elective hernia repair.  He received Kefzol prior to the procedure.  Hair was removed from the surgical site with clippers.  SCD stockings for DVT prevention.  Operative note: With the patient under adequate general anesthesia the area was cleansed with ChloraPrep and draped.  Marcaine was infiltrated for postoperative analgesia.  An infraumbilical incision was made and carried down through skin subtenons tissue with hemostasis achieved by electrocautery.  The hernia sac was dissected off the overlying umbilical skin.  The sac was dissected back to the fascia where it was transected with cautery excised and discarded.  The fascial defect was approximately 1 cm in diameter.  This was approximated with interrupted 0 Surgilon sutures under direct vision.  The inferior surface of the fascia was cleared prior to suture placement.  The umbilical skin was tacked to the fascia with a 3-0 Vicryl figure-of-eight suture.  The adipose layer was approximated with a running 3-0 Vicryl suture.  The skin was closed with a running 4-0 Vicryl subcuticular suture.  Benzoin, Steri-Strips, Telfa and Tegaderm dressing applied.  The patient tolerated the procedure well was taken recovery room in stable condition.

## 2017-02-26 NOTE — Anesthesia Procedure Notes (Signed)
Procedure Name: LMA Insertion Date/Time: 02/26/2017 10:59 AM Performed by: Jonna Clark Pre-anesthesia Checklist: Patient identified, Patient being monitored, Timeout performed, Emergency Drugs available and Suction available Patient Re-evaluated:Patient Re-evaluated prior to induction Oxygen Delivery Method: Circle system utilized Preoxygenation: Pre-oxygenation with 100% oxygen Induction Type: IV induction Ventilation: Mask ventilation without difficulty LMA: LMA inserted LMA Size: 4.0 Tube type: Oral Number of attempts: 1 Placement Confirmation: positive ETCO2 and breath sounds checked- equal and bilateral Tube secured with: Tape Dental Injury: Teeth and Oropharynx as per pre-operative assessment

## 2017-03-01 NOTE — Anesthesia Postprocedure Evaluation (Signed)
Anesthesia Post Note  Patient: Joshua Guerrero  Procedure(s) Performed: HERNIA REPAIR UMBILICAL ADULT (N/A )  Patient location during evaluation: PACU Anesthesia Type: General Level of consciousness: awake and alert Pain management: pain level controlled Vital Signs Assessment: post-procedure vital signs reviewed and stable Respiratory status: spontaneous breathing, nonlabored ventilation, respiratory function stable and patient connected to nasal cannula oxygen Cardiovascular status: stable and blood pressure returned to baseline Postop Assessment: no apparent nausea or vomiting Anesthetic complications: no     Last Vitals:  Vitals:   02/26/17 1218 02/26/17 1232  BP: (!) 146/76 (!) 149/76  Pulse: (!) 57 60  Resp: 16 18  Temp:    SpO2: 100% 100%    Last Pain:  Vitals:   02/26/17 1233  TempSrc:   PainSc: Wright Breda Bond

## 2017-03-03 ENCOUNTER — Other Ambulatory Visit: Payer: Self-pay | Admitting: Family Medicine

## 2017-03-04 NOTE — Telephone Encounter (Signed)
Please review for Dr Rosanna Randy. Patient uses this medication chronically as needed.- Gowin V Tocara Mennen, RMA

## 2017-03-09 DIAGNOSIS — M961 Postlaminectomy syndrome, not elsewhere classified: Secondary | ICD-10-CM | POA: Diagnosis not present

## 2017-03-09 DIAGNOSIS — M47812 Spondylosis without myelopathy or radiculopathy, cervical region: Secondary | ICD-10-CM | POA: Diagnosis not present

## 2017-03-09 DIAGNOSIS — M47817 Spondylosis without myelopathy or radiculopathy, lumbosacral region: Secondary | ICD-10-CM | POA: Diagnosis not present

## 2017-03-09 DIAGNOSIS — G894 Chronic pain syndrome: Secondary | ICD-10-CM | POA: Diagnosis not present

## 2017-03-11 ENCOUNTER — Ambulatory Visit: Payer: PPO | Admitting: General Surgery

## 2017-03-17 ENCOUNTER — Encounter: Payer: Self-pay | Admitting: General Surgery

## 2017-03-17 ENCOUNTER — Ambulatory Visit (INDEPENDENT_AMBULATORY_CARE_PROVIDER_SITE_OTHER): Payer: PPO | Admitting: General Surgery

## 2017-03-17 VITALS — BP 118/80 | HR 66 | Resp 18 | Ht 68.0 in | Wt 180.0 lb

## 2017-03-17 DIAGNOSIS — K429 Umbilical hernia without obstruction or gangrene: Secondary | ICD-10-CM

## 2017-03-17 NOTE — Patient Instructions (Signed)
Patient to return as needed. The patient is aware to call back for any questions or concerns. 

## 2017-03-17 NOTE — Progress Notes (Signed)
Patient ID: Joshua Guerrero, male   DOB: 10-21-1955, 61 y.o.   MRN: [id number]  Chief Complaint  Patient presents with  . Routine Post Op    HPI Joshua Guerrero is a 61 y.o. male here today for his post op umbilical hernia repair done on 02/26/2017. Patient states he is doing well.  HPI  Past Medical History:  Diagnosis Date  . Arthritis    lower back  . Brain bleed ([place]) 10/2016   AT [facility] NO SURGERY  . Confusion caused by a drug ([place])    fentanyl and oxycodone  . GERD (gastroesophageal reflux disease)   . Headache    past hx, secondary to nerve damage from accident  . Hypertension    CONTROLLED ON MEDS  . Neck pain, chronic   . Neuromuscular disorder ([place])    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  . Sleep apnea    Not currently using a C-Pap    Past Surgical History:  Procedure Laterality Date  . NASAL SINUS SURGERY     due to severe sleep apnea  . NECK SURGERY     diffuse disc-and put in possibly screws patient thinks  . SHOULDER SURGERY Bilateral    x 2    Family History  Problem Relation Age of Onset  . Dementia Father   . Cancer Mother   . Cancer Sister   . Cancer Maternal Aunt     Social History Social History   Tobacco Use  . Smoking status: Heavy Tobacco Smoker    Packs/day: 1.00    Years: 20.00    Pack years: 20.00    Types: Cigarettes  . Smokeless tobacco: Never Used  . Tobacco comment: Had quit for 8 yrs. Father died 24-Dec-2022. has had several cigarettes last few days.  Substance Use Topics  . Alcohol use: No  . Drug use: No    Allergies  Allergen Reactions  . Acetaminophen Shortness Of Breath  . Codeine Itching and Nausea And Vomiting  . Opana [Oxymorphone] Swelling    Current Outpatient Medications  Medication Sig Dispense Refill  . ALPRAZolam (XANAX) 1 MG tablet Take 1 tablet (1 mg total) by mouth 3 (three) times daily as needed. (Patient taking differently: Take 1 mg by mouth 2 (two) times daily. 1 tab at  9pm and another at 11pm if the first xanax doesn't work) 90 tablet 5  . Ascorbic Acid (VITAMIN C PO) Take by mouth. am    . Aspirin-Salicylamide-Caffeine (BC HEADACHE POWDER PO) Take 1 tablet by mouth as needed.     . B Complex Vitamins (VITAMIN B COMPLEX PO) Take by mouth. am    . baclofen (LIORESAL) 10 MG tablet Take 5 mg by mouth 3 (three) times daily. STARTING AT LUNCHTIME    . desvenlafaxine (PRISTIQ) 50 MG 24 hr tablet Take 1 tablet (50 mg total) by mouth daily. (Patient taking differently: Take 50 mg by mouth every morning. ) 90 tablet 1  . etodolac (LODINE) 500 MG tablet Take 1 tablet (500 mg total) by mouth 2 (two) times daily. 60 tablet 3  . fenofibrate (TRICOR) 145 MG tablet Take 1 tablet (145 mg total) by mouth daily. 30 tablet 12  . fentaNYL (DURAGESIC - DOSED MCG/HR) 50 MCG/HR Place 50 mcg onto the skin every 3 (three) days.     . furosemide (LASIX) 40 MG tablet TAKE ONE TO TWO TABLETS IN THE MORNING AS NEEDED FOR SWELLING 60 tablet 12  .  lansoprazole (PREVACID) 30 MG capsule Take 1 capsule (30 mg total) by mouth 2 (two) times daily. 60 capsule 11  . metoprolol succinate (TOPROL-XL) 50 MG 24 hr tablet TAKE 1 TABLET BY MOUTH EVERY DAY. (Patient taking differently: TAKE 1 TABLET BY MOUTH EVERY DAY-AM) 90 tablet 2  . Multiple Vitamin (MULTIVITAMIN) capsule Take 1 capsule by mouth daily.    Marland Kitchen oxyCODONE-acetaminophen (PERCOCET) 10-325 MG tablet Take 1 tablet by mouth every 4 (four) hours. 3AM, 7AM, 11AM, 3 PM, 7PM AND 11PM    . oxyCODONE-acetaminophen (PERCOCET) 10-325 MG tablet Take 1 tablet by mouth every 4 (four) hours as needed for pain. If needed for breakthrough pain at surgical site. 12 tablet 0  . potassium chloride (K-DUR) 10 MEQ tablet Take 1 tablet (10 mEq total) by mouth daily. 30 tablet 5  . promethazine (PHENERGAN) 25 MG tablet TAKE 1 TABLET BY MOUTH EVERY 8 HOURS AS NEEDED FOR NAUSEA OR VOMITING 35 tablet 1   No current facility-administered medications for this visit.      Review of Systems Review of Systems  Constitutional: Negative.   Respiratory: Negative.   Cardiovascular: Negative.     Blood pressure 118/80, pulse 66, resp. rate 18, height 5\' 8"  (1.727 m), weight 180 lb (81.6 kg).  Physical Exam Physical Exam  Constitutional: He is oriented to person, place, and time. He appears well-developed and well-nourished.  Abdominal:    Umbilical hernia repair is intact and healing well.   Neurological: He is alert and oriented to person, place, and time.  Skin: Skin is warm and dry.         Assessment    Doing well status post primary umbilical hernia repair.    Plan        Patient to return as needed. The patient is aware to call back for any questions or concerns.  HPI, Physical Exam, Assessment and Plan have been scribed under the direction and in the presence of Hervey Ard, MD.  Gaspar Cola, CMA I have completed the exam and reviewed the above documentation for accuracy and completeness.  I agree with the above.  [employer] has been used and any errors in dictation or transcription are unintentional.  Hervey Ard, M.D., F.A.C.S. Marland Kitchen Robert Bellow 03/17/2017, 1:45 PM

## 2017-03-29 ENCOUNTER — Other Ambulatory Visit: Payer: Self-pay | Admitting: Family Medicine

## 2017-04-07 DIAGNOSIS — G894 Chronic pain syndrome: Secondary | ICD-10-CM | POA: Diagnosis not present

## 2017-04-07 DIAGNOSIS — M961 Postlaminectomy syndrome, not elsewhere classified: Secondary | ICD-10-CM | POA: Diagnosis not present

## 2017-04-07 DIAGNOSIS — M47812 Spondylosis without myelopathy or radiculopathy, cervical region: Secondary | ICD-10-CM | POA: Diagnosis not present

## 2017-04-07 DIAGNOSIS — M47817 Spondylosis without myelopathy or radiculopathy, lumbosacral region: Secondary | ICD-10-CM | POA: Diagnosis not present

## 2017-04-12 ENCOUNTER — Ambulatory Visit: Payer: Self-pay | Admitting: Family Medicine

## 2017-04-19 ENCOUNTER — Ambulatory Visit: Payer: Self-pay | Admitting: Family Medicine

## 2017-05-07 DIAGNOSIS — M961 Postlaminectomy syndrome, not elsewhere classified: Secondary | ICD-10-CM | POA: Diagnosis not present

## 2017-05-07 DIAGNOSIS — M47812 Spondylosis without myelopathy or radiculopathy, cervical region: Secondary | ICD-10-CM | POA: Diagnosis not present

## 2017-05-07 DIAGNOSIS — M47817 Spondylosis without myelopathy or radiculopathy, lumbosacral region: Secondary | ICD-10-CM | POA: Diagnosis not present

## 2017-05-07 DIAGNOSIS — G894 Chronic pain syndrome: Secondary | ICD-10-CM | POA: Diagnosis not present

## 2017-05-10 ENCOUNTER — Ambulatory Visit (INDEPENDENT_AMBULATORY_CARE_PROVIDER_SITE_OTHER): Payer: PPO | Admitting: Family Medicine

## 2017-05-10 ENCOUNTER — Other Ambulatory Visit: Payer: Self-pay

## 2017-05-10 VITALS — BP 118/74 | HR 70 | Temp 98.7°F | Resp 18 | Wt 175.0 lb

## 2017-05-10 DIAGNOSIS — Z1159 Encounter for screening for other viral diseases: Secondary | ICD-10-CM

## 2017-05-10 DIAGNOSIS — F419 Anxiety disorder, unspecified: Secondary | ICD-10-CM | POA: Diagnosis not present

## 2017-05-10 DIAGNOSIS — F339 Major depressive disorder, recurrent, unspecified: Secondary | ICD-10-CM

## 2017-05-10 DIAGNOSIS — E7849 Other hyperlipidemia: Secondary | ICD-10-CM | POA: Diagnosis not present

## 2017-05-10 DIAGNOSIS — I10 Essential (primary) hypertension: Secondary | ICD-10-CM | POA: Diagnosis not present

## 2017-05-10 NOTE — Progress Notes (Signed)
Joshua Guerrero  MRN: [id number] DOB: 07/25/1955  Subjective:  HPI   The patient is a 62 year old male who presents today for his 4 month follow up from August.    Anxiety-on his last visit it was discussed that the patient may need to be referred to psychiatry.  He reports that he has been out of his antidepressant for about 1 week and states that his anxiety is much worse.  He describes it as being 'out of control' and 'off the wall'.  Depression-The patient states this is very bad right now because he has been out of his Pristiq.  He also said that the Peters works but if it wasn't so expensive he would want to go up to the next strength.  Headaches-the patient states he has headaches every day.  He has been using BC powders for the headaches and taking 2-4 daily, on top of the pain medicine.     Patient Active Problem List   Diagnosis Date Noted  . Hypokalemia 05/26/2016  . Salicylate overdose 84/16/6063  . Skull lesion 03/05/2016  . Hypogammaglobulinemia ([place]) 12/19/2015  . BPH (benign prostatic hypertrophy) 11/11/2015  . Personal history of colonic polyps   . Disease of colon   . Benign neoplasm of descending colon   . Benign neoplasm of sigmoid colon   . Rhabdomyolysis 11/20/2014  . Absolute anemia 09/22/2014  . Anxiety 09/22/2014  . 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 Back pain, chronic 09/22/2014  . Chronic cervical pain 09/22/2014  . Clinical depression 09/22/2014  . Acid reflux 09/22/2014  . HLD (hyperlipidemia) 09/22/2014  . BP (high blood pressure) 09/22/2014  . Iron deficiency 09/22/2014  . Disease characterized by destruction of skeletal muscle 09/22/2014  . Apnea, sleep 09/22/2014  . Umbilical hernia without obstruction and without gangrene 09/22/2014    Past Medical History:  Diagnosis Date  . Arthritis    lower back  . Brain bleed ([place]) 10/2016   AT [facility] NO SURGERY  . Confusion caused by a drug ([place])    fentanyl and  oxycodone  . GERD (gastroesophageal reflux disease)   . Headache    past hx, secondary to nerve damage from accident  . Hypertension    CONTROLLED ON MEDS  . Neck pain, chronic   . 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 Sleep apnea    Not currently using a C-Pap    Social History   Socioeconomic History  . Marital status: Divorced    Spouse name: single  . Number of children: 0  . Years of education: 31  . Highest education level: Not on file  Social Needs  . Financial resource strain: Not on file  . Food insecurity - worry: Not on file  . Food insecurity - inability: Not on file  . Transportation needs - medical: Not on file  . Transportation needs - non-medical: Not on file  Occupational History  . Occupation: disability  Tobacco Use  . Smoking status: Heavy Tobacco Smoker    Packs/day: 1.00    Years: 20.00    Pack years: 20.00    Types: Cigarettes  . Smokeless tobacco: Never Used  . Tobacco comment: Had quit for 8 yrs. Father died 01/11/23. has had several cigarettes last few days.  Substance and Sexual Activity  . Alcohol use: No  . Drug use: No  . Sexual activity: No  Other Topics Concern  .  Not on file  Social History Narrative  . Not on file    Outpatient Encounter Medications as of 05/10/2017  Medication Sig Note  . ALPRAZolam (XANAX) 1 MG tablet Take 1 tablet (1 mg total) by mouth 3 (three) times daily as needed. (Patient taking differently: Take 1 mg by mouth 2 (two) times daily. 1 tab at 9pm and another at 11pm if the first xanax doesn't work)   . Ascorbic Acid (VITAMIN C PO) Take by mouth. am   . Aspirin-Salicylamide-Caffeine (BC HEADACHE POWDER PO) Take 1 tablet by mouth as needed.    . B Complex Vitamins (VITAMIN B COMPLEX PO) Take by mouth. am   . baclofen (LIORESAL) 10 MG tablet Take 5 mg by mouth 3 (three) times daily. STARTING AT LUNCHTIME   . desvenlafaxine (PRISTIQ) 50 MG 24 hr tablet Take 1 tablet (50 mg  total) by mouth daily. (Patient taking differently: Take 50 mg by mouth every morning. )   . fentaNYL (DURAGESIC - DOSED MCG/HR) 50 MCG/HR Place 50 mcg onto the skin every 3 (three) days.    . lansoprazole (PREVACID) 30 MG capsule Take 1 capsule (30 mg total) by mouth 2 (two) times daily.   . metoprolol succinate (TOPROL-XL) 50 MG 24 hr tablet TAKE 1 TABLET BY MOUTH EVERY DAY.   . Multiple Vitamin (MULTIVITAMIN) capsule Take 1 capsule by mouth daily.   Marland Kitchen oxyCODONE-acetaminophen (PERCOCET) 10-325 MG tablet Take 1 tablet by mouth every 4 (four) hours. 3AM, 7AM, 11AM, 3 PM, 7PM AND 11PM   . oxyCODONE-acetaminophen (PERCOCET) 10-325 MG tablet Take 1 tablet by mouth every 4 (four) hours as needed for pain. If needed for breakthrough pain at surgical site.   . promethazine (PHENERGAN) 25 MG tablet TAKE 1 TABLET BY MOUTH EVERY 8 HOURS AS NEEDED FOR NAUSEA OR VOMITING   . fenofibrate (TRICOR) 145 MG tablet Take 1 tablet (145 mg total) by mouth daily. (Patient not taking: Reported on 05/10/2017)   . potassium chloride (K-DUR) 10 MEQ tablet Take 1 tablet (10 mEq total) by mouth daily. (Patient not taking: Reported on 05/10/2017) 05/10/2017: Patient has been out for 1 week and has not been taking it.    . [DISCONTINUED] etodolac (LODINE) 500 MG tablet Take 1 tablet (500 mg total) by mouth 2 (two) times daily.   . [DISCONTINUED] furosemide (LASIX) 40 MG tablet TAKE ONE TO TWO TABLETS IN THE MORNING AS NEEDED FOR SWELLING (Patient not taking: Reported on 05/10/2017)    No facility-administered encounter medications on file as of 05/10/2017.     Allergies  Allergen Reactions  . Acetaminophen Shortness Of Breath  . Codeine Itching and Nausea And Vomiting  . Opana [Oxymorphone] Swelling    Review of Systems  Constitutional: Positive for malaise/fatigue. Negative for fever.  HENT: Negative.   Respiratory: Negative for cough, shortness of breath and wheezing.   Cardiovascular: Positive for claudication and leg  swelling. Negative for chest pain, palpitations and orthopnea.  Gastrointestinal: Negative.   Musculoskeletal: Positive for back pain.  Skin: Negative.   Neurological: Positive for weakness and headaches.  Psychiatric/Behavioral: Positive for depression and memory loss. Negative for hallucinations, substance abuse and suicidal ideas. The patient is nervous/anxious. The patient does not have insomnia.     Objective:  BP 118/74 (BP Location: Right Arm, Patient Position: Sitting, Cuff Size: Normal)   Pulse 70   Temp 98.7 F (37.1 C) (Oral)   Resp 18   Wt 175 lb (79.4 kg)   BMI 26.61 kg/m  Physical Exam  Constitutional: He is oriented to person, place, and time and well-developed, well-nourished, and in no distress.  HENT:  Head: Normocephalic and atraumatic.  Right Ear: External ear normal.  Left Ear: External ear normal.  Nose: Nose normal.  Eyes: Conjunctivae are normal. No scleral icterus.  Neck: No thyromegaly present.  Cardiovascular: Normal rate, regular rhythm and normal heart sounds.  Pulmonary/Chest: Effort normal and breath sounds normal.  Abdominal: Soft.  Lymphadenopathy:    He has no cervical adenopathy.  Neurological: He is alert and oriented to person, place, and time. GCS score is 15.  Skin: Skin is warm and dry.  Psychiatric: Mood and affect normal.    Assessment and Plan :   1. Essential hypertension  - CBC with Differential/Platelet - Comprehensive metabolic panel - TSH  2. Other hyperlipidemia  - Lipid Panel With LDL/HDL Ratio  3. Need for hepatitis C screening test  - Hepatitis C antibody 4.Chronic Pain/DDD Per pain clinic 5.Headache Per neurology. 6.MDD/GAD Decrease Xanax dose next refill in pt on chronic narcotics.  I have done the exam and reviewed the chart and it is accurate to the best of my knowledge. Development worker, community has been used and  any errors in dictation or transcription are unintentional. Miguel Aschoff M.D. [facility]

## 2017-05-12 ENCOUNTER — Other Ambulatory Visit: Payer: Self-pay | Admitting: Physician Assistant

## 2017-05-12 ENCOUNTER — Other Ambulatory Visit: Payer: Self-pay | Admitting: Family Medicine

## 2017-05-21 ENCOUNTER — Encounter: Payer: Self-pay | Admitting: Family Medicine

## 2017-05-31 ENCOUNTER — Telehealth: Payer: Self-pay | Admitting: Family Medicine

## 2017-06-04 DIAGNOSIS — M47817 Spondylosis without myelopathy or radiculopathy, lumbosacral region: Secondary | ICD-10-CM | POA: Diagnosis not present

## 2017-06-04 DIAGNOSIS — G894 Chronic pain syndrome: Secondary | ICD-10-CM | POA: Diagnosis not present

## 2017-06-04 DIAGNOSIS — M47812 Spondylosis without myelopathy or radiculopathy, cervical region: Secondary | ICD-10-CM | POA: Diagnosis not present

## 2017-06-04 DIAGNOSIS — M961 Postlaminectomy syndrome, not elsewhere classified: Secondary | ICD-10-CM | POA: Diagnosis not present

## 2017-06-22 DIAGNOSIS — M47817 Spondylosis without myelopathy or radiculopathy, lumbosacral region: Secondary | ICD-10-CM | POA: Diagnosis not present

## 2017-06-30 ENCOUNTER — Other Ambulatory Visit: Payer: Self-pay | Admitting: Family Medicine

## 2017-07-02 DIAGNOSIS — G894 Chronic pain syndrome: Secondary | ICD-10-CM | POA: Diagnosis not present

## 2017-07-02 DIAGNOSIS — M961 Postlaminectomy syndrome, not elsewhere classified: Secondary | ICD-10-CM | POA: Diagnosis not present

## 2017-07-02 DIAGNOSIS — M47812 Spondylosis without myelopathy or radiculopathy, cervical region: Secondary | ICD-10-CM | POA: Diagnosis not present

## 2017-07-02 DIAGNOSIS — M47817 Spondylosis without myelopathy or radiculopathy, lumbosacral region: Secondary | ICD-10-CM | POA: Diagnosis not present

## 2017-07-06 ENCOUNTER — Ambulatory Visit (INDEPENDENT_AMBULATORY_CARE_PROVIDER_SITE_OTHER): Payer: PPO | Admitting: Family Medicine

## 2017-07-06 ENCOUNTER — Telehealth: Payer: Self-pay | Admitting: Family Medicine

## 2017-07-06 VITALS — BP 154/80 | HR 60 | Temp 98.5°F | Resp 18 | Wt 175.0 lb

## 2017-07-06 DIAGNOSIS — F419 Anxiety disorder, unspecified: Secondary | ICD-10-CM | POA: Diagnosis not present

## 2017-07-06 DIAGNOSIS — M549 Dorsalgia, unspecified: Secondary | ICD-10-CM | POA: Diagnosis not present

## 2017-07-06 DIAGNOSIS — G8929 Other chronic pain: Secondary | ICD-10-CM | POA: Diagnosis not present

## 2017-07-06 MED ORDER — ALPRAZOLAM 0.5 MG PO TABS: 0.5000 mg | ORAL_TABLET | Freq: Two times a day (BID) | ORAL | 3 refills | Status: DC | PRN

## 2017-07-06 NOTE — Progress Notes (Signed)
Joshua Guerrero  MRN: [id number] DOB: 03/17/1956  Subjective:  HPI   The patient is a 62 year old male who presents for follow up and to refill his medications.  Patient is requesting refills on Xanax and Phenergan.   The patient asked if there was anything else out there in place of the Xanax.  When asked why he said it was because "they" were saying you could only have one or the other.  When asked what he was talking about he said he was told by someone (people on the TV) that because people are dying in their sleep when they take Xanax and Hydrocodone, they could only take one.    After reviewing the medications, the patient was asked how often he is using the Phenergan and he said he takes a 1/2 of a table every other day or so.  He has a refill on that prescription and when he was told there was a refill he stated that he must not need that one filled.  Patient Active Problem List   Diagnosis Date Noted  . Hypokalemia 05/26/2016  . Salicylate overdose 95/01/3266  . Hypogammaglobulinemia ([place]) 12/19/2015  . BPH (benign prostatic hypertrophy) 11/11/2015  . Personal history of colonic polyps   . Disease of colon   . Benign neoplasm of descending colon   . Benign neoplasm of sigmoid colon   . Anxiety 09/22/2014  . 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 Back pain, chronic 09/22/2014  . Chronic cervical pain 09/22/2014  . Clinical depression 09/22/2014  . Acid reflux 09/22/2014  . HLD (hyperlipidemia) 09/22/2014  . BP (high blood pressure) 09/22/2014  . Iron deficiency 09/22/2014  . Apnea, sleep 09/22/2014  . Umbilical hernia without obstruction and without gangrene 09/22/2014    Past Medical History:  Diagnosis Date  . Arthritis    lower back  . Brain bleed ([place]) 10/2016   AT [facility] NO SURGERY  . Confusion caused by a drug ([place])    fentanyl and oxycodone  . GERD (gastroesophageal reflux disease)   . Headache    past hx, secondary to nerve damage from  accident  . Hypertension    CONTROLLED ON MEDS  . Neck pain, chronic   . 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 Sleep apnea    Not currently using a C-Pap    Social History   Socioeconomic History  . Marital status: Divorced    Spouse name: single  . Number of children: 0  . Years of education: 47  . Highest education level: Not on file  Social Needs  . Financial resource strain: Not on file  . Food insecurity - worry: Not on file  . Food insecurity - inability: Not on file  . Transportation needs - medical: Not on file  . Transportation needs - non-medical: Not on file  Occupational History  . Occupation: disability  Tobacco Use  . Smoking status: Heavy Tobacco Smoker    Packs/day: 1.00    Years: 20.00    Pack years: 20.00    Types: Cigarettes  . Smokeless tobacco: Never Used  . Tobacco comment: Had quit for 8 yrs. Father died Jan 14, 2023. has had several cigarettes last few days.  Substance and Sexual Activity  . Alcohol use: No  . Drug use: No  . Sexual activity: No  Other Topics Concern  . Not on file  Social History Narrative  . Not on  file    Outpatient Encounter Medications as of 07/06/2017  Medication Sig Note  . ALPRAZolam (XANAX) 1 MG tablet Take 1 tablet (1 mg total) by mouth 3 (three) times daily as needed. (Patient taking differently: Take 1 mg by mouth 2 (two) times daily. 1 tab at 9pm and another at 11pm if the first xanax doesn't work)   . Ascorbic Acid (VITAMIN C PO) Take by mouth. am   . Aspirin-Salicylamide-Caffeine (BC HEADACHE POWDER PO) Take 1 tablet by mouth as needed.    . B Complex Vitamins (VITAMIN B COMPLEX PO) Take by mouth. am   . baclofen (LIORESAL) 10 MG tablet Take 5 mg by mouth 3 (three) times daily. STARTING AT LUNCHTIME   . desvenlafaxine (PRISTIQ) 50 MG 24 hr tablet TAKE 1 TABLET BY MOUTH EVERY DAY   . fentaNYL (DURAGESIC - DOSED MCG/HR) 50 MCG/HR Place 50 mcg onto the skin every 3 (three)  days.    . lansoprazole (PREVACID) 30 MG capsule Take 1 capsule (30 mg total) by mouth 2 (two) times daily.   . metoprolol succinate (TOPROL-XL) 50 MG 24 hr tablet TAKE 1 TABLET BY MOUTH EVERY DAY.   . Multiple Vitamin (MULTIVITAMIN) capsule Take 1 capsule by mouth daily.   Marland Kitchen oxyCODONE-acetaminophen (PERCOCET) 10-325 MG tablet Take 1 tablet by mouth every 4 (four) hours. 3AM, 7AM, 11AM, 3 PM, 7PM AND 11PM   . oxyCODONE-acetaminophen (PERCOCET) 10-325 MG tablet Take 1 tablet by mouth every 4 (four) hours as needed for pain. If needed for breakthrough pain at surgical site.   . promethazine (PHENERGAN) 25 MG tablet TAKE 1 TABLET BY MOUTH EVERY 8 HOURS AS NEEDED FOR NAUSEA OR VOMITING   . [DISCONTINUED] fenofibrate (TRICOR) 145 MG tablet Take 1 tablet (145 mg total) by mouth daily. (Patient not taking: Reported on 05/10/2017)   . [DISCONTINUED] lansoprazole (PREVACID) 30 MG capsule TAKE ONE CAPSULE BY MOUTH TWICE A DAY   . [DISCONTINUED] potassium chloride (K-DUR) 10 MEQ tablet Take 1 tablet (10 mEq total) by mouth daily. (Patient not taking: Reported on 05/10/2017) 05/10/2017: Patient has been out for 1 week and has not been taking it.     No facility-administered encounter medications on file as of 07/06/2017.     Allergies  Allergen Reactions  . Acetaminophen Shortness Of Breath  . Codeine Itching and Nausea And Vomiting  . Opana [Oxymorphone] Swelling    Review of Systems  Constitutional: Positive for malaise/fatigue. Negative for fever.  Eyes: Negative.   Respiratory: Negative for cough, shortness of breath and wheezing.   Cardiovascular: Negative for chest pain, palpitations, orthopnea, claudication and leg swelling.  Gastrointestinal: Negative.   Musculoskeletal: Positive for back pain.  Endo/Heme/Allergies: Negative.   Psychiatric/Behavioral: Negative.     Objective:  BP (!) 154/80 (BP Location: Right Arm, Patient Position: Sitting, Cuff Size: Normal)   Pulse 60   Temp 98.5 F (36.9  C) (Oral)   Resp 18   Wt 175 lb (79.4 kg)   BMI 26.61 kg/m   Physical Exam  Constitutional: He is oriented to person, place, and time and well-developed, well-nourished, and in no distress.  HENT:  Head: Normocephalic and atraumatic.  Eyes: Conjunctivae are normal.  Neck: No thyromegaly present.  Cardiovascular: Normal rate, regular rhythm and normal heart sounds.  Pulmonary/Chest: Effort normal and breath sounds normal.  Abdominal: Soft.  Neurological: He is alert and oriented to person, place, and time. Gait normal. GCS score is 15.  Skin: Skin is warm and dry.  Psychiatric: Mood, memory, affect and judgment normal.    Assessment and Plan :  1. Anxiety Told we will cut Xanax dose in half and on next visit will cut in half again. Will also do urine drug test on next visit to document use of meds as directed. - ALPRAZolam (XANAX) 0.5 MG tablet; Take 1 tablet (0.5 mg total) by mouth 2 (two) times daily as needed.  Dispense: 60 tablet; Refill: 3 2.Chronic Pain Per pain clinic.  I have done the exam and reviewed the chart and it is accurate to the best of my knowledge. Development worker, community has been used and  any errors in dictation or transcription are unintentional. Miguel Aschoff M.D. [facility]

## 2017-07-07 ENCOUNTER — Telehealth: Payer: Self-pay

## 2017-07-07 NOTE — Telephone Encounter (Signed)
lmtcb

## 2017-07-07 NOTE — Telephone Encounter (Signed)
LMTCB ED 

## 2017-07-07 NOTE — Telephone Encounter (Signed)
Pt states he misplaced his Xanax prescription that were filled yesterday. Pt is requesting a call back from [facility] regarding this.

## 2017-07-08 NOTE — Telephone Encounter (Signed)
Patient informed that we could not refill this until it was time.

## 2017-07-08 NOTE — Telephone Encounter (Signed)
Pt returned call. Thanks TNP °

## 2017-07-30 DIAGNOSIS — M47812 Spondylosis without myelopathy or radiculopathy, cervical region: Secondary | ICD-10-CM | POA: Diagnosis not present

## 2017-07-30 DIAGNOSIS — M961 Postlaminectomy syndrome, not elsewhere classified: Secondary | ICD-10-CM | POA: Diagnosis not present

## 2017-07-30 DIAGNOSIS — G894 Chronic pain syndrome: Secondary | ICD-10-CM | POA: Diagnosis not present

## 2017-07-30 DIAGNOSIS — M47817 Spondylosis without myelopathy or radiculopathy, lumbosacral region: Secondary | ICD-10-CM | POA: Diagnosis not present

## 2017-08-05 NOTE — Telephone Encounter (Signed)
complete

## 2017-08-12 ENCOUNTER — Other Ambulatory Visit: Payer: Self-pay | Admitting: Family Medicine

## 2017-08-18 ENCOUNTER — Telehealth: Payer: Self-pay

## 2017-08-18 NOTE — Telephone Encounter (Signed)
LM on listed number on DPR and requested pt to CB to r/s his AWV that he cancelled today. AWV needs to be scheduled prior to CPE on 08/25/17. -MM

## 2017-08-25 ENCOUNTER — Encounter: Payer: Self-pay | Admitting: Family Medicine

## 2017-08-25 ENCOUNTER — Ambulatory Visit (INDEPENDENT_AMBULATORY_CARE_PROVIDER_SITE_OTHER): Payer: PPO | Admitting: Family Medicine

## 2017-08-25 VITALS — BP 130/70 | HR 68 | Temp 99.0°F | Resp 16 | Ht 68.0 in | Wt 171.0 lb

## 2017-08-25 DIAGNOSIS — Z0000 Encounter for general adult medical examination without abnormal findings: Secondary | ICD-10-CM | POA: Diagnosis not present

## 2017-08-25 DIAGNOSIS — R5383 Other fatigue: Secondary | ICD-10-CM

## 2017-08-25 DIAGNOSIS — I10 Essential (primary) hypertension: Secondary | ICD-10-CM

## 2017-08-25 DIAGNOSIS — K219 Gastro-esophageal reflux disease without esophagitis: Secondary | ICD-10-CM

## 2017-08-25 DIAGNOSIS — Z1211 Encounter for screening for malignant neoplasm of colon: Secondary | ICD-10-CM | POA: Diagnosis not present

## 2017-08-25 DIAGNOSIS — E7849 Other hyperlipidemia: Secondary | ICD-10-CM

## 2017-08-25 DIAGNOSIS — N400 Enlarged prostate without lower urinary tract symptoms: Secondary | ICD-10-CM | POA: Diagnosis not present

## 2017-08-25 LAB — IFOBT (OCCULT BLOOD): IMMUNOLOGICAL FECAL OCCULT BLOOD TEST: POSITIVE

## 2017-08-25 MED ORDER — PANTOPRAZOLE SODIUM 40 MG PO TBEC: 40.0000 mg | DELAYED_RELEASE_TABLET | Freq: Every day | ORAL | 3 refills | Status: DC

## 2017-08-25 NOTE — Progress Notes (Signed)
Patient: Joshua Guerrero, Male    DOB: 23-Jun-1955, 62 y.o.   MRN: [id number] Visit Date: 08/25/2017  Today's Provider: [name], MD   Chief Complaint  Patient presents with  . Annual Exam   Subjective:    Annual physical exam Joshua Guerrero is a 62 y.o. male who presents today for health maintenance and complete physical. He feels fairly well. He reports he is not exercising . He reports he is sleeping fairly well. Almost all of his complaints are chronic. -----------------------------------------------------------------   Review of Systems  Constitutional: Positive for activity change, appetite change and diaphoresis.  HENT: Positive for dental problem, hearing loss and sinus pressure.   Eyes: Negative.   Respiratory: Positive for apnea.   Cardiovascular: Negative.   Gastrointestinal: Positive for constipation.  Endocrine: Positive for polydipsia.  Genitourinary: Negative.   Musculoskeletal: Positive for arthralgias, back pain, gait problem, joint swelling, myalgias, neck pain and neck stiffness.  Skin: Negative.   Allergic/Immunologic: Negative.   Neurological: Positive for dizziness, numbness and headaches.  Hematological: Negative.   Psychiatric/Behavioral: The patient is nervous/anxious.     Social History      He  reports that he has been smoking cigarettes.  He has a 20.00 pack-year smoking history. He has never used smokeless tobacco. He reports that he does not drink alcohol or use drugs.       Social History   Socioeconomic History  . Marital status: Divorced    Spouse name: single  . Number of children: 0  . Years of education: 52  . Highest education level: Not on file  Occupational History  . Occupation: disability  Social Needs  . Financial resource strain: Not on file  . Food insecurity:    Worry: Not on file    Inability: Not on file  . Transportation needs:    Medical: Not on file    Non-medical: Not on file  Tobacco Use  .  Smoking status: Heavy Tobacco Smoker    Packs/day: 1.00    Years: 20.00    Pack years: 20.00    Types: Cigarettes  . Smokeless tobacco: Never Used  . Tobacco comment: Had quit for 8 yrs. Father died 2023-01-14. has had several cigarettes last few days.  Substance and Sexual Activity  . Alcohol use: No  . Drug use: No  . Sexual activity: Never  Lifestyle  . Physical activity:    Days per week: Not on file    Minutes per session: Not on file  . Stress: Not on file  Relationships  . Social connections:    Talks on phone: Not on file    Gets together: Not on file    Attends religious service: Not on file    Active member of club or organization: Not on file    Attends meetings of clubs or organizations: Not on file    Relationship status: Not on file  Other Topics Concern  . Not on file  Social History Narrative  . Not on file    Past Medical History:  Diagnosis Date  . Arthritis    lower back  . Brain bleed ([place]) 10/2016   AT [facility] NO SURGERY  . Confusion caused by a drug ([place])    fentanyl and oxycodone  . GERD (gastroesophageal reflux disease)   . Headache    past hx, secondary to nerve damage from accident  . Hypertension    CONTROLLED ON MEDS  .  Neck pain, chronic   . Neuromuscular disorder ([place])    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  . Sleep apnea    Not currently using a C-Pap     Patient Active Problem List   Diagnosis Date Noted  . Hypokalemia 05/26/2016  . Salicylate overdose 32/35/5732  . Hypogammaglobulinemia ([place]) 12/19/2015  . BPH (benign prostatic hypertrophy) 11/11/2015  . Personal history of colonic polyps   . Disease of colon   . Benign neoplasm of descending colon   . Benign neoplasm of sigmoid colon   . Anxiety 09/22/2014  . 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 Back pain, chronic 09/22/2014  . Chronic cervical pain 09/22/2014  . Clinical depression 09/22/2014  . Acid reflux 09/22/2014  . HLD  (hyperlipidemia) 09/22/2014  . BP (high blood pressure) 09/22/2014  . Iron deficiency 09/22/2014  . Apnea, sleep 09/22/2014  . Umbilical hernia without obstruction and without gangrene 09/22/2014    Past Surgical History:  Procedure Laterality Date  . COLONOSCOPY WITH PROPOFOL N/A 02/11/2015   Procedure: COLONOSCOPY WITH PROPOFOL;  Surgeon: Lucilla Lame, MD;  Location: [facility];  Service: Endoscopy;  Laterality: N/A;  USES C-PAP  . NASAL SINUS SURGERY     due to severe sleep apnea  . NECK SURGERY     diffuse disc-and put in possibly screws patient thinks  . SHOULDER SURGERY Bilateral    x 2  . UMBILICAL HERNIA REPAIR N/A 02/26/2017   Primary repair.  Surgeon: Robert Bellow, MD;  Location: [facility];  Service: General;  Laterality: N/A;    Family History        Family Status  Relation Name Status  . Father  Deceased  . Mother  Deceased  . Sister  Deceased  . Mat Aunt  Deceased        His family history includes Cancer in his maternal aunt, mother, and sister; Dementia in his father.      Allergies  Allergen Reactions  . Acetaminophen Shortness Of Breath  . Codeine Itching and Nausea And Vomiting  . Opana [Oxymorphone] Swelling     Current Outpatient Medications:  .  ALPRAZolam (XANAX) 0.5 MG tablet, Take 1 tablet (0.5 mg total) by mouth 2 (two) times daily as needed., Disp: 60 tablet, Rfl: 3 .  Ascorbic Acid (VITAMIN C PO), Take by mouth. am, Disp: , Rfl:  .  Aspirin-Salicylamide-Caffeine (BC HEADACHE POWDER PO), Take 1 tablet by mouth as needed. , Disp: , Rfl:  .  B Complex Vitamins (VITAMIN B COMPLEX PO), Take by mouth. am, Disp: , Rfl:  .  baclofen (LIORESAL) 10 MG tablet, Take 5 mg by mouth 3 (three) times daily. STARTING AT LUNCHTIME, Disp: , Rfl:  .  desvenlafaxine (PRISTIQ) 50 MG 24 hr tablet, TAKE 1 TABLET BY MOUTH EVERY DAY, Disp: 90 tablet, Rfl: 1 .  fentaNYL (DURAGESIC - DOSED MCG/HR) 50 MCG/HR, Place 50 mcg onto the skin every 3 (three) days. ,  Disp: , Rfl:  .  lansoprazole (PREVACID) 30 MG capsule, Take 1 capsule (30 mg total) by mouth 2 (two) times daily., Disp: 60 capsule, Rfl: 11 .  metoprolol succinate (TOPROL-XL) 50 MG 24 hr tablet, TAKE 1 TABLET BY MOUTH EVERY DAY., Disp: 90 tablet, Rfl: 1 .  Multiple Vitamin (MULTIVITAMIN) capsule, Take 1 capsule by mouth daily., Disp: , Rfl:  .  oxyCODONE-acetaminophen (PERCOCET) 10-325 MG tablet, Take 1 tablet by mouth every 4 (four) hours. 3AM, 7AM, 11AM, 3 PM, 7PM  AND 11PM, Disp: , Rfl:  .  oxyCODONE-acetaminophen (PERCOCET) 10-325 MG tablet, Take 1 tablet by mouth every 4 (four) hours as needed for pain. If needed for breakthrough pain at surgical site., Disp: 12 tablet, Rfl: 0 .  promethazine (PHENERGAN) 25 MG tablet, TAKE 1 TABLET BY MOUTH EVERY 8 HOURS AS NEEDED FOR NAUSEA OR VOMITING, Disp: 35 tablet, Rfl: 1   Patient Care Team: Jerrol Banana., MD as PCP - General (Family Medicine) Jerrol Banana., MD (Family Medicine) Bary Castilla, Forest Gleason, MD (General Surgery)      Objective:   Vitals: BP 130/70 (BP Location: Left Arm, Patient Position: Sitting, Cuff Size: Normal)   Pulse 68   Temp 99 F (37.2 C) (Oral)   Resp 16   Ht 5\' 8"  (1.727 m)   Wt 171 lb (77.6 kg)   BMI 26.00 kg/m    Vitals:   08/25/17 1039  BP: 130/70  Pulse: 68  Resp: 16  Temp: 99 F (37.2 C)  TempSrc: Oral  Weight: 171 lb (77.6 kg)  Height: 5\' 8"  (1.727 m)     Physical Exam  Constitutional: He is oriented to person, place, and time. He appears well-developed and well-nourished.  HENT:  Head: Normocephalic and atraumatic.  Right Ear: External ear normal.  Left Ear: External ear normal.  Nose: Nose normal.  Mouth/Throat: Oropharynx is clear and moist.  Eyes: Pupils are equal, round, and reactive to light. Left eye exhibits no discharge. No scleral icterus.  Neck: Neck supple. No thyromegaly present.  Cardiovascular: Normal rate, regular rhythm, normal heart sounds and intact distal  pulses.  Pulmonary/Chest: Effort normal and breath sounds normal.  Abdominal: Soft.  Genitourinary: Rectum normal, prostate normal and penis normal.  Musculoskeletal: He exhibits no edema.  Lymphadenopathy:    He has no cervical adenopathy.  Neurological: He is alert and oriented to person, place, and time.  Skin: Skin is warm and dry.  Psychiatric: He has a normal mood and affect. His behavior is normal. Judgment and thought content normal.     Depression Screen PHQ 2/9 Scores 08/25/2017 12/23/2016 11/18/2016 07/09/2016  PHQ - 2 Score 2 2 4 2   PHQ- 9 Score 7 9 14 12       Assessment & Plan:     Routine Health Maintenance and Physical Exam  Exercise Activities and Dietary recommendations Goals    None      Immunization History  Administered Date(s) Administered  . Influenza,inj,Quad PF,6+ Mos 02/20/2015, 05/26/2016, 12/23/2016  . Pneumococcal Polysaccharide-23 05/26/2016  . Tdap 07/31/2008    Health Maintenance  Topic Date Due  . Hepatitis C Screening  1955-05-30  . HIV Screening  10/30/1972  . INFLUENZA VACCINE  12/02/2017  . TETANUS/TDAP  08/01/2018  . COLONOSCOPY  02/10/2025     Discussed health benefits of physical activity, and encouraged him to engage in regular exercise appropriate for his age and condition.  Tobacco Abuse MDD HLD BPH Fatigue GERD Change Prevacid to Protonix 40mg   Labs. RTC 3-4 months.   --------------------------------------------------------------------   I have done the exam and reviewed the above chart and it is accurate to the best of my knowledge. Development worker, community has been used in this note in any air is in the dictation or transcription are unintentional.  [name], MD  [place]

## 2017-08-26 LAB — COMPREHENSIVE METABOLIC PANEL
A/G RATIO: 2.1 (ref 1.2–2.2)
ALK PHOS: 52 IU/L (ref 39–117)
ALT: 5 IU/L (ref 0–44)
AST: 15 IU/L (ref 0–40)
Albumin: 4.4 g/dL (ref 3.5–5.5)
BUN/Creatinine Ratio: 12 (ref 9–20)
BUN: 10 mg/dL (ref 6–24)
CHLORIDE: 104 mmol/L (ref 96–106)
CO2: 23 mmol/L (ref 20–29)
Calcium: 9.2 mg/dL (ref 8.7–10.2)
Creatinine, Ser: 0.85 mg/dL (ref 0.76–1.27)
GFR calc Af Amer: 110 mL/min/{1.73_m2} (ref >59)
GFR calc non Af Amer: 95 mL/min/{1.73_m2} (ref >59)
Globulin, Total: 2.1 g/dL (ref 1.5–4.5)
Glucose: 81 mg/dL (ref 65–99)
Potassium: 4.2 mmol/L (ref 3.5–5.2)
Sodium: 142 mmol/L (ref 134–144)
Total Protein: 6.5 g/dL (ref 6.0–8.5)

## 2017-08-26 LAB — CBC WITH DIFFERENTIAL/PLATELET
Basophils Absolute: 0.1 10*3/uL (ref 0.0–0.2)
Basos: 1 %
EOS (ABSOLUTE): 0.4 10*3/uL (ref 0.0–0.4)
Eos: 4 %
Hematocrit: 38.9 % (ref 37.5–51.0)
Hemoglobin: 13 g/dL (ref 13.0–17.7)
IMMATURE GRANULOCYTES: 0 %
Immature Grans (Abs): 0 10*3/uL (ref 0.0–0.1)
Lymphocytes Absolute: 1.7 10*3/uL (ref 0.7–3.1)
Lymphs: 17 %
MCH: 31 pg (ref 26.6–33.0)
MCHC: 33.4 g/dL (ref 31.5–35.7)
MCV: 93 fL (ref 79–97)
MONOS ABS: 0.6 10*3/uL (ref 0.1–0.9)
Monocytes: 6 %
NEUTROS ABS: 7.1 10*3/uL — AB (ref 1.4–7.0)
NEUTROS PCT: 72 %
PLATELETS: 311 10*3/uL (ref 150–379)
RBC: 4.2 x10E6/uL (ref 4.14–5.80)
RDW: 16.2 % — AB (ref 12.3–15.4)
WBC: 9.9 10*3/uL (ref 3.4–10.8)

## 2017-08-26 LAB — TSH: TSH: 1.75 u[IU]/mL (ref 0.450–4.500)

## 2017-08-26 LAB — LIPID PANEL WITH LDL/HDL RATIO
Cholesterol, Total: 175 mg/dL (ref 100–199)
HDL: 26 mg/dL — AB (ref >39)
LDL Calculated: 113 mg/dL — ABNORMAL HIGH (ref 0–99)
LDl/HDL Ratio: 4.3 ratio — ABNORMAL HIGH (ref 0.0–3.6)
TRIGLYCERIDES: 180 mg/dL — AB (ref 0–149)
VLDL Cholesterol Cal: 36 mg/dL (ref 5–40)

## 2017-08-26 LAB — PSA: Prostate Specific Ag, Serum: 0.4 ng/mL (ref 0.0–4.0)

## 2017-08-27 DIAGNOSIS — M961 Postlaminectomy syndrome, not elsewhere classified: Secondary | ICD-10-CM | POA: Diagnosis not present

## 2017-08-27 DIAGNOSIS — G894 Chronic pain syndrome: Secondary | ICD-10-CM | POA: Diagnosis not present

## 2017-08-27 DIAGNOSIS — M47817 Spondylosis without myelopathy or radiculopathy, lumbosacral region: Secondary | ICD-10-CM | POA: Diagnosis not present

## 2017-08-27 DIAGNOSIS — M47812 Spondylosis without myelopathy or radiculopathy, cervical region: Secondary | ICD-10-CM | POA: Diagnosis not present

## 2017-08-30 ENCOUNTER — Telehealth: Payer: Self-pay

## 2017-08-30 NOTE — Telephone Encounter (Signed)
-----   Message from Jerrol Banana., MD sent at 08/26/2017  4:02 PM EDT ----- stable

## 2017-08-30 NOTE — Telephone Encounter (Signed)
LMTCB 08/30/2017  Thanks,   -Mickel Baas

## 2017-08-31 NOTE — Telephone Encounter (Signed)
Left detailed message per DPR. sd

## 2017-09-02 ENCOUNTER — Telehealth: Payer: Self-pay

## 2017-09-02 DIAGNOSIS — R1084 Generalized abdominal pain: Secondary | ICD-10-CM

## 2017-09-02 MED ORDER — SUCRALFATE 1 G PO TABS: 1.0000 g | ORAL_TABLET | Freq: Three times a day (TID) | ORAL | 0 refills | Status: DC

## 2017-09-02 NOTE — Telephone Encounter (Signed)
Patient called the office complaining of generalized abdominal pain, nausea and dizziness. He states he had these symptoms when he was last seen in the office for his physical on 08/25/2017 by Dr. Rosanna Randy. During that visit Dr. Rosanna Randy changed Lansoprazole to Pantoprazole. Patient says this medication change has not helped his symptoms. He denies any fever, vomiting, constipation or diarrhea. I consulted with Dr. Rosanna Randy who recommend we send in a prescription for Carafate, and if no better to have patient follow up in the office for an evaluation. Patient was advised of this and agrees to try medication. Prescription sent into pharmacy. Patient states he would call back if symptoms didn't improve.

## 2017-09-07 ENCOUNTER — Ambulatory Visit: Payer: Self-pay | Admitting: Family Medicine

## 2017-09-09 ENCOUNTER — Encounter: Payer: Self-pay | Admitting: Family Medicine

## 2017-09-09 ENCOUNTER — Ambulatory Visit (INDEPENDENT_AMBULATORY_CARE_PROVIDER_SITE_OTHER): Payer: PPO | Admitting: Family Medicine

## 2017-09-09 VITALS — BP 130/66 | HR 67 | Temp 99.0°F | Resp 16 | Wt 180.0 lb

## 2017-09-09 DIAGNOSIS — R42 Dizziness and giddiness: Secondary | ICD-10-CM | POA: Diagnosis not present

## 2017-09-09 DIAGNOSIS — R1011 Right upper quadrant pain: Secondary | ICD-10-CM | POA: Diagnosis not present

## 2017-09-09 DIAGNOSIS — R112 Nausea with vomiting, unspecified: Secondary | ICD-10-CM | POA: Diagnosis not present

## 2017-09-09 NOTE — Patient Instructions (Addendum)
Avoid fatty meals pending evaluation of your gallbladder. We will call you about the ultrasound scheduling.

## 2017-09-09 NOTE — Progress Notes (Signed)
  Subjective:     Patient ID: Joshua Guerrero, male   DOB: October 16, 1955, 62 y.o.   MRN: [id number] Chief Complaint  Patient presents with  . Nausea    x 1 week   HPI Reports today that he has had progressive RUQ pain after meals. Now with dizziness, persistent nausea, and vomiting onset last night. Recent CBC and Met C (08/25/17) WNL. States he last moved his bowels yesterday and denies constipation.  Review of Systems     Objective:   Physical Exam  Constitutional: He appears well-developed and well-nourished. No distress.  Cardiovascular: Normal rate and regular rhythm.  Pulmonary/Chest: Breath sounds normal.  Abdominal: Soft. There is tenderness (moderate in RUQ with + Murphy's sign).       Assessment:    1. Dizziness: multiple medications may be contributing  2. Mild nausea and vomiting: promethazine as needed. - US Abdomen Complete; Future  3. Right upper quadrant abdominal pain - US Abdomen Complete; Future    Plan:    Low fat food choices pending ultrasound results.

## 2017-09-10 ENCOUNTER — Other Ambulatory Visit: Payer: Self-pay

## 2017-09-10 ENCOUNTER — Emergency Department: Payer: PPO

## 2017-09-10 ENCOUNTER — Ambulatory Visit: Payer: PPO

## 2017-09-10 ENCOUNTER — Emergency Department
Admission: EM | Admit: 2017-09-10 | Discharge: 2017-09-10 | Disposition: A | Discharge status: Home / Self Care | Payer: PPO | Attending: Emergency Medicine | Admitting: Emergency Medicine

## 2017-09-10 ENCOUNTER — Telehealth: Payer: Self-pay

## 2017-09-10 DIAGNOSIS — F17210 Nicotine dependence, cigarettes, uncomplicated: Secondary | ICD-10-CM | POA: Insufficient documentation

## 2017-09-10 DIAGNOSIS — Z79899 Other long term (current) drug therapy: Secondary | ICD-10-CM | POA: Diagnosis not present

## 2017-09-10 DIAGNOSIS — R600 Localized edema: Secondary | ICD-10-CM | POA: Diagnosis not present

## 2017-09-10 DIAGNOSIS — I10 Essential (primary) hypertension: Secondary | ICD-10-CM | POA: Diagnosis not present

## 2017-09-10 DIAGNOSIS — R1011 Right upper quadrant pain: Secondary | ICD-10-CM | POA: Diagnosis not present

## 2017-09-10 DIAGNOSIS — K2970 Gastritis, unspecified, without bleeding: Secondary | ICD-10-CM | POA: Diagnosis not present

## 2017-09-10 DIAGNOSIS — R112 Nausea with vomiting, unspecified: Secondary | ICD-10-CM | POA: Diagnosis not present

## 2017-09-10 LAB — CBC WITH DIFFERENTIAL/PLATELET
Basophils Absolute: 0 10*3/uL (ref 0–0.1)
Basophils Relative: 0 %
Eosinophils Absolute: 0.4 10*3/uL (ref 0–0.7)
Eosinophils Relative: 5 %
HCT: 38.7 % — ABNORMAL LOW (ref 40.0–52.0)
Hemoglobin: 13.1 g/dL (ref 13.0–18.0)
Lymphocytes Relative: 22 %
Lymphs Abs: 1.8 10*3/uL (ref 1.0–3.6)
MCH: 31.1 pg (ref 26.0–34.0)
MCHC: 33.8 g/dL (ref 32.0–36.0)
MCV: 91.9 fL (ref 80.0–100.0)
Monocytes Absolute: 0.5 10*3/uL (ref 0.2–1.0)
Monocytes Relative: 7 %
Neutro Abs: 5.5 10*3/uL (ref 1.4–6.5)
Neutrophils Relative %: 66 %
Platelets: 328 10*3/uL (ref 150–440)
RBC: 4.22 MIL/uL — ABNORMAL LOW (ref 4.40–5.90)
RDW: 15.8 % — ABNORMAL HIGH (ref 11.5–14.5)
WBC: 8.2 10*3/uL (ref 3.8–10.6)

## 2017-09-10 LAB — COMPREHENSIVE METABOLIC PANEL
ALT: 11 U/L — ABNORMAL LOW (ref 17–63)
AST: 18 U/L (ref 15–41)
Albumin: 3.9 g/dL (ref 3.5–5.0)
Alkaline Phosphatase: 44 U/L (ref 38–126)
Anion gap: 8 (ref 5–15)
BUN: 9 mg/dL (ref 6–20)
CO2: 25 mmol/L (ref 22–32)
Calcium: 9 mg/dL (ref 8.9–10.3)
Chloride: 107 mmol/L (ref 101–111)
Creatinine, Ser: 0.86 mg/dL (ref 0.61–1.24)
GFR calc Af Amer: >60 mL/min (ref >60)
GFR calc non Af Amer: >60 mL/min (ref >60)
Glucose, Bld: 121 mg/dL — ABNORMAL HIGH (ref 65–99)
Potassium: 3.6 mmol/L (ref 3.5–5.1)
Sodium: 140 mmol/L (ref 135–145)
Total Bilirubin: 0.3 mg/dL (ref 0.3–1.2)
Total Protein: 7.1 g/dL (ref 6.5–8.1)

## 2017-09-10 LAB — LIPASE, BLOOD: Lipase: 21 U/L (ref 11–51)

## 2017-09-10 MED ORDER — FAMOTIDINE 40 MG PO TABS: 40.0000 mg | ORAL_TABLET | Freq: Every evening | ORAL | 1 refills | Status: DC

## 2017-09-10 MED ORDER — GI COCKTAIL ~~LOC~~
30.0000 mL | Freq: Once | ORAL | Status: AC
  Administered 2017-09-10: 30 mL via ORAL

## 2017-09-10 MED ORDER — GI COCKTAIL ~~LOC~~
ORAL | Status: AC
  Filled 2017-09-10: qty 30

## 2017-09-10 NOTE — Telephone Encounter (Signed)
I would recommend the ER and they will get the appropriate imaging done promptly.

## 2017-09-10 NOTE — ED Triage Notes (Signed)
Pt states that for the past week he has had N/V with off and on abd pain - his PCP told him it was his gallbladder - he had a scan scheduled for today but stated that he was to "sick" to go

## 2017-09-10 NOTE — ED Notes (Signed)
Paper copy of discharge signed 

## 2017-09-10 NOTE — Telephone Encounter (Signed)
Patient advised and instructed to go to ED. KW

## 2017-09-10 NOTE — Discharge Instructions (Addendum)
Please seek medical attention for any high fevers, chest pain, shortness of breath, change in behavior, persistent vomiting, bloody stool or any other new or concerning symptoms.  

## 2017-09-10 NOTE — ED Notes (Signed)
Pt states that for the past week he has had N/V with off and on abd pain - his PCP told him it was his gallbladder - he had a scan scheduled for today but stated that he was too sick to make it to appointment.

## 2017-09-10 NOTE — Telephone Encounter (Signed)
Patient states that he is unable to get ultrasound done today, patient reports symptoms of vomiting, nausea and fever high of 99. Patient reports that he has been laying in bed and feels weak and tired. Please advise if patient needs to be seen in ED? KW

## 2017-09-10 NOTE — ED Provider Notes (Signed)
[facility] Emergency Department Provider Note   ____________________________________________   I have reviewed the triage vital signs and the nursing notes.   HISTORY  Chief Complaint Emesis and Abdominal Pain   History limited by: Not Limited   HPI Joshua Guerrero is a 62 y.o. male who presents to the emergency department today because of concerns for abdominal pain nausea and vomiting.  The patient states that symptoms have been going on for roughly 1 week.  Abdominal pain is located in the upper and right upper abdomen.  Has had multiple episodes of nausea and vomiting and is concerned he might be dehydrated.  He has spoken to his primary care doctor about this and actually had an ultrasound of his right upper quadrant scheduled for this morning however due to nausea vomiting he did not feel capable of driving himself to that appointment.  He denies any history of abdominal issues or abdominal surgeries.  He denies any fevers.  Per medical record review patient has a history of benign neoplasm of colon.  Past Medical History:  Diagnosis Date  . Arthritis    lower back  . Brain bleed ([place]) 10/2016   AT [facility] NO SURGERY  . Confusion caused by a drug ([place])    fentanyl and oxycodone  . GERD (gastroesophageal reflux disease)   . Headache    past hx, secondary to nerve damage from accident  . Hypertension    CONTROLLED ON MEDS  . Neck pain, chronic   . 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 Sleep apnea    Not currently using a C-Pap    Patient Active Problem List   Diagnosis Date Noted  . Hypokalemia 05/26/2016  . Salicylate overdose 94/85/4627  . Hypogammaglobulinemia ([place]) 12/19/2015  . Benign prostatic hyperplasia 11/11/2015  . Personal history of colonic polyps   . Disease of colon   . Benign neoplasm of descending colon   . Benign neoplasm of sigmoid colon   . Anxiety 09/22/2014   . 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 Back pain, chronic 09/22/2014  . Chronic cervical pain 09/22/2014  . Clinical depression 09/22/2014  . Acid reflux 09/22/2014  . HLD (hyperlipidemia) 09/22/2014  . BP (high blood pressure) 09/22/2014  . Iron deficiency 09/22/2014  . Apnea, sleep 09/22/2014  . Umbilical hernia without obstruction and without gangrene 09/22/2014    Past Surgical History:  Procedure Laterality Date  . COLONOSCOPY WITH PROPOFOL N/A 02/11/2015   Procedure: COLONOSCOPY WITH PROPOFOL;  Surgeon: Lucilla Lame, MD;  Location: [facility];  Service: Endoscopy;  Laterality: N/A;  USES C-PAP  . NASAL SINUS SURGERY     due to severe sleep apnea  . NECK SURGERY     diffuse disc-and put in possibly screws patient thinks  . SHOULDER SURGERY Bilateral    x 2  . UMBILICAL HERNIA REPAIR N/A 02/26/2017   Primary repair.  Surgeon: Robert Bellow, MD;  Location: [facility];  Service: General;  Laterality: N/A;    Prior to Admission medications   Medication Sig Start Date End Date Taking? Authorizing Provider  ALPRAZolam Duanne Moron) 0.5 MG tablet Take 1 tablet (0.5 mg total) by mouth 2 (two) times daily as needed. 07/06/17   Jerrol Banana., MD  Ascorbic Acid (VITAMIN C PO) Take by mouth. am    [provider]  Aspirin-Salicylamide-Caffeine (BC HEADACHE POWDER PO) Take 1 tablet by mouth as needed.  [provider]  B Complex Vitamins (VITAMIN B COMPLEX PO) Take by mouth. am    [provider]  baclofen (LIORESAL) 10 MG tablet Take 5 mg by mouth 3 (three) times daily. STARTING AT LUNCHTIME 05/22/16   [provider]  desvenlafaxine (PRISTIQ) 50 MG 24 hr tablet TAKE 1 TABLET BY MOUTH EVERY DAY 05/13/17   Jerrol Banana., MD  fentaNYL (DURAGESIC - DOSED MCG/HR) 50 MCG/HR Place 50 mcg onto the skin every 3 (three) days.  12/15/11   [provider]  metoprolol succinate (TOPROL-XL) 50 MG 24 hr tablet TAKE 1 TABLET BY  MOUTH EVERY DAY. 03/29/17   Jerrol Banana., MD  Multiple Vitamin (MULTIVITAMIN) capsule Take 1 capsule by mouth daily.    [provider]  oxyCODONE-acetaminophen (PERCOCET) 10-325 MG tablet Take 1 tablet by mouth every 4 (four) hours. 3AM, 7AM, 11AM, 3 PM, 7PM AND 11PM    [provider]  oxyCODONE-acetaminophen (PERCOCET) 10-325 MG tablet Take 1 tablet by mouth every 4 (four) hours as needed for pain. If needed for breakthrough pain at surgical site. 02/26/17 02/26/18  Robert Bellow, MD  pantoprazole (PROTONIX) 40 MG tablet Take 1 tablet (40 mg total) by mouth daily. 08/25/17   Jerrol Banana., MD  promethazine (PHENERGAN) 25 MG tablet TAKE 1 TABLET BY MOUTH EVERY 8 HOURS AS NEEDED FOR NAUSEA OR VOMITING Patient not taking: Reported on 09/09/2017 08/12/17   Jerrol Banana., MD  sucralfate (CARAFATE) 1 g tablet Take 1 tablet (1 g total) by mouth 4 (four) times daily -  with meals and at bedtime. 09/02/17   Jerrol Banana., MD    Allergies Acetaminophen; Codeine; and Opana [oxymorphone]  Family History  Problem Relation Age of Onset  . Dementia Father   . Cancer Mother   . Cancer Sister   . Cancer Maternal Aunt     Social History Social History   Tobacco Use  . Smoking status: Heavy Tobacco Smoker    Packs/day: 1.00    Years: 20.00    Pack years: 20.00    Types: Cigarettes  . Smokeless tobacco: Never Used  . Tobacco comment: Had quit for 8 yrs. Father died 2022/12/29. has had several cigarettes last few days.  Substance Use Topics  . Alcohol use: No  . Drug use: No    Review of Systems Constitutional: No fever/chills Eyes: No visual changes. ENT: No sore throat. Cardiovascular: Denies chest pain. Respiratory: Denies shortness of breath. Gastrointestinal: Positive for abdominal pain, nausea and vomiting.  Genitourinary: Negative for dysuria. Musculoskeletal: Negative for back pain. Skin: Negative for rash. Neurological: Negative  for headaches, focal weakness or numbness.  ____________________________________________   PHYSICAL EXAM:  VITAL SIGNS: ED Triage Vitals  Enc Vitals Group     BP 09/10/17 1551 (!) 155/81     Pulse Rate 09/10/17 1551 76     Resp 09/10/17 1551 16     Temp 09/10/17 1551 97.7 F (36.5 C)     Temp Source 09/10/17 1551 Oral     SpO2 09/10/17 1551 97 %     Weight 09/10/17 1552 180 lb (81.6 kg)     Height 09/10/17 1552 5\' 11"  (1.803 m)     Head Circumference --      Peak Flow --      Pain Score 09/10/17 1551 9   Constitutional: Alert and oriented. Well appearing and in no distress. Eyes: Conjunctivae are normal.  ENT  Head: Normocephalic and atraumatic.   Nose: No congestion/rhinnorhea.   Mouth/Throat: Mucous membranes are moist.   Neck: No stridor. Hematological/Lymphatic/Immunilogical: No cervical lymphadenopathy. Cardiovascular: Normal rate, regular rhythm.  No murmurs, rubs, or gallops.  Respiratory: Normal respiratory effort without tachypnea nor retractions. Breath sounds are clear and equal bilaterally. No wheezes/rales/rhonchi. Gastrointestinal: Soft and tender to palpation in the epigastric and RUQ. Genitourinary: Deferred Musculoskeletal: Normal range of motion in all extremities. 1+ pitting edema in bilateral lower extremities.  Neurologic:  Normal speech and language. No gross focal neurologic deficits are appreciated.  Skin:  Skin is warm, dry and intact. No rash noted. Psychiatric: Mood and affect are normal. Speech and behavior are normal. Patient exhibits appropriate insight and judgment.  ____________________________________________    LABS (pertinent positives/negatives)  Lipase 21 CMP wnl except glu 121, alt 11 CBC wbc 8.2, hgb 13.1, plt 328  ____________________________________________   EKG  None  ____________________________________________    RADIOLOGY  RUQ [place] No concerning gallbladder  findings  ____________________________________________   PROCEDURES  Procedures  ____________________________________________   INITIAL IMPRESSION / ASSESSMENT AND PLAN / ED COURSE  Pertinent labs & imaging results that were available during my care of the patient were reviewed by me and considered in my medical decision making (see chart for details).  Patient presented to the emergency department today because of concerns for right upper quadrant abdominal pain.  Patient has been to his primary care doctor for this and had an ultrasound scheduled for today.  He was unable to make it there.  Ultrasound was obtained here in the emergency department did not show any concerning gallbladder findings.  Patient was given GI cocktail and he did state that this made him feel better.  At this point I think gastritis or gastric ulcer or duodenitis or duodenal ulcer likely.  Discussed this with the patient.  __________________________________   FINAL CLINICAL IMPRESSION(S) / ED DIAGNOSES  Final diagnoses:  RUQ pain  Gastritis, presence of bleeding unspecified, unspecified chronicity, unspecified gastritis type     Note: This dictation was prepared with Dragon dictation. Any transcriptional errors that result from this process are unintentional     Nance Pear, MD 09/10/17 2128

## 2017-09-13 ENCOUNTER — Other Ambulatory Visit: Payer: Self-pay | Admitting: Family Medicine

## 2017-09-14 ENCOUNTER — Ambulatory Visit (INDEPENDENT_AMBULATORY_CARE_PROVIDER_SITE_OTHER): Payer: PPO | Admitting: Family Medicine

## 2017-09-14 ENCOUNTER — Encounter: Payer: Self-pay | Admitting: Family Medicine

## 2017-09-14 VITALS — BP 148/78 | HR 64 | Temp 98.8°F | Resp 16 | Wt 178.0 lb

## 2017-09-14 DIAGNOSIS — R1011 Right upper quadrant pain: Secondary | ICD-10-CM

## 2017-09-14 NOTE — Progress Notes (Signed)
  Subjective:     Patient ID: Joshua Guerrero, male   DOB: 07-Apr-1956, 62 y.o.   MRN: [id number] Chief Complaint  Patient presents with  . Abdominal Pain    Pt is here today for continued nausea and abdominal pain. He reports that all the test at the ER was normal.   . Nausea   HPI RUQ ultrasound and ER labs unrevealing of source of his chronic discomfort. Continues to take the Protonix and Carafate but discontinued Pepcid.  Review of Systems     Objective:   Physical Exam  Constitutional: He appears well-developed and well-nourished. He does not appear ill. No distress.       Assessment:    1. Right upper quadrant abdominal pain - Ambulatory referral to Gastroenterology - H. pylori breath test    Plan:    Resume Pepcid at bedtime.

## 2017-09-14 NOTE — Patient Instructions (Signed)
Resume Pepcid at bedtime. We will call you with the results of the breath test and the G.I. Referral.

## 2017-09-15 LAB — H. PYLORI BREATH TEST: H pylori Breath Test: NEGATIVE

## 2017-09-16 ENCOUNTER — Telehealth: Payer: Self-pay

## 2017-09-16 NOTE — Telephone Encounter (Signed)
[id number] 

## 2017-09-16 NOTE — Telephone Encounter (Signed)
-----   Message from Carmon Ginsberg, [place] sent at 09/16/2017  7:28 AM EDT ----- Breath test negative for bacteria triggered ulcers

## 2017-09-17 ENCOUNTER — Encounter: Payer: Self-pay | Admitting: Gastroenterology

## 2017-09-21 NOTE — Telephone Encounter (Signed)
LMOM for patient to call back about labs. Also notice [facility] also was trying to reach him about appt for his abdominal pain.

## 2017-09-23 ENCOUNTER — Telehealth: Payer: Self-pay

## 2017-09-23 NOTE — Telephone Encounter (Signed)
06/03/16 T spine and L spine x-ray ordered and no results in chart

## 2017-09-28 ENCOUNTER — Ambulatory Visit: Payer: Self-pay | Admitting: Psychiatry

## 2017-10-01 DIAGNOSIS — M47817 Spondylosis without myelopathy or radiculopathy, lumbosacral region: Secondary | ICD-10-CM | POA: Diagnosis not present

## 2017-10-01 DIAGNOSIS — G894 Chronic pain syndrome: Secondary | ICD-10-CM | POA: Diagnosis not present

## 2017-10-01 DIAGNOSIS — M47812 Spondylosis without myelopathy or radiculopathy, cervical region: Secondary | ICD-10-CM | POA: Diagnosis not present

## 2017-10-01 DIAGNOSIS — M961 Postlaminectomy syndrome, not elsewhere classified: Secondary | ICD-10-CM | POA: Diagnosis not present

## 2017-10-01 DIAGNOSIS — Z79891 Long term (current) use of opiate analgesic: Secondary | ICD-10-CM | POA: Diagnosis not present

## 2017-10-06 ENCOUNTER — Other Ambulatory Visit: Payer: Self-pay | Admitting: Family Medicine

## 2017-10-06 DIAGNOSIS — R1084 Generalized abdominal pain: Secondary | ICD-10-CM

## 2017-10-06 NOTE — Telephone Encounter (Signed)
Pt contacted office for refill request on the following medications:  sucralfate (CARAFATE) 1 g tablet  [place]  Pt stated that with his wife being in and out of the hospital he hasn't been able to come in for OV. Pt stated that he will try to schedule for next week. Please advise. Thanks TNP

## 2017-10-19 ENCOUNTER — Ambulatory Visit: Payer: Self-pay | Admitting: Family Medicine

## 2017-11-02 DIAGNOSIS — M47817 Spondylosis without myelopathy or radiculopathy, lumbosacral region: Secondary | ICD-10-CM | POA: Diagnosis not present

## 2017-11-02 DIAGNOSIS — M961 Postlaminectomy syndrome, not elsewhere classified: Secondary | ICD-10-CM | POA: Diagnosis not present

## 2017-11-02 DIAGNOSIS — G894 Chronic pain syndrome: Secondary | ICD-10-CM | POA: Diagnosis not present

## 2017-11-02 DIAGNOSIS — M47812 Spondylosis without myelopathy or radiculopathy, cervical region: Secondary | ICD-10-CM | POA: Diagnosis not present

## 2017-11-10 ENCOUNTER — Other Ambulatory Visit: Payer: Self-pay | Admitting: Family Medicine

## 2017-11-24 ENCOUNTER — Encounter: Payer: Self-pay | Admitting: *Deleted

## 2017-11-24 ENCOUNTER — Ambulatory Visit: Payer: PPO | Admitting: Gastroenterology

## 2017-12-03 DIAGNOSIS — M961 Postlaminectomy syndrome, not elsewhere classified: Secondary | ICD-10-CM | POA: Diagnosis not present

## 2017-12-03 DIAGNOSIS — M47812 Spondylosis without myelopathy or radiculopathy, cervical region: Secondary | ICD-10-CM | POA: Diagnosis not present

## 2017-12-03 DIAGNOSIS — G894 Chronic pain syndrome: Secondary | ICD-10-CM | POA: Diagnosis not present

## 2017-12-03 DIAGNOSIS — M47817 Spondylosis without myelopathy or radiculopathy, lumbosacral region: Secondary | ICD-10-CM | POA: Diagnosis not present

## 2017-12-22 ENCOUNTER — Ambulatory Visit: Payer: Self-pay | Admitting: Family Medicine

## 2017-12-27 DIAGNOSIS — M47817 Spondylosis without myelopathy or radiculopathy, lumbosacral region: Secondary | ICD-10-CM | POA: Diagnosis not present

## 2017-12-27 DIAGNOSIS — M5481 Occipital neuralgia: Secondary | ICD-10-CM | POA: Diagnosis not present

## 2017-12-27 DIAGNOSIS — G894 Chronic pain syndrome: Secondary | ICD-10-CM | POA: Diagnosis not present

## 2017-12-27 DIAGNOSIS — M47812 Spondylosis without myelopathy or radiculopathy, cervical region: Secondary | ICD-10-CM | POA: Diagnosis not present

## 2017-12-27 DIAGNOSIS — M961 Postlaminectomy syndrome, not elsewhere classified: Secondary | ICD-10-CM | POA: Diagnosis not present

## 2018-02-01 DIAGNOSIS — M47812 Spondylosis without myelopathy or radiculopathy, cervical region: Secondary | ICD-10-CM | POA: Diagnosis not present

## 2018-02-01 DIAGNOSIS — M961 Postlaminectomy syndrome, not elsewhere classified: Secondary | ICD-10-CM | POA: Diagnosis not present

## 2018-02-01 DIAGNOSIS — M47817 Spondylosis without myelopathy or radiculopathy, lumbosacral region: Secondary | ICD-10-CM | POA: Diagnosis not present

## 2018-02-01 DIAGNOSIS — G894 Chronic pain syndrome: Secondary | ICD-10-CM | POA: Diagnosis not present

## 2018-02-08 ENCOUNTER — Encounter: Payer: Self-pay | Admitting: Gastroenterology

## 2018-02-08 ENCOUNTER — Other Ambulatory Visit: Payer: Self-pay

## 2018-02-08 ENCOUNTER — Ambulatory Visit (INDEPENDENT_AMBULATORY_CARE_PROVIDER_SITE_OTHER): Payer: PPO | Admitting: Gastroenterology

## 2018-02-08 VITALS — BP 150/81 | HR 66 | Resp 16 | Ht 68.0 in | Wt 187.8 lb

## 2018-02-08 DIAGNOSIS — M7918 Myalgia, other site: Secondary | ICD-10-CM | POA: Diagnosis not present

## 2018-02-08 NOTE — Progress Notes (Signed)
Gastroenterology Consultation  Referring Provider:     Carmon Ginsberg, [place] Primary Care Physician:  Jerrol Banana., MD Primary Gastroenterologist:  Dr. Allen Norris     Reason for Consultation:     Diffuse abdominal pain        HPI:   Joshua Guerrero is a 62 y.o. y/o male referred for consultation & management of diffuse abdominal pain by Dr. Rosanna Randy, Retia Passe., MD.  This patient comes in today with a report of abdominal pain for the last few months.  The patient states that his abdominal pain is both on the right side and left side.  The patient had a colonoscopy in 2016 due to a history of colon polyps.  At that time the patient had polyps removed and was recommended to have a repeat colonoscopy in 5 years.  The patient was also found to have ischemic colitis on the right side of his colon.  The patient now reports that his abdominal pain hurts mostly on the right side but also on the left side.  It is made worse with bending and twisting.  He does not report the pain to be made any worse with eating or drinking.  The patient has chronic constipation due to him taking pain medication.  There is no report of any unexplained weight loss black stools or bloody stools.  He also denies any fevers chills nausea or vomiting.  Past Medical History:  Diagnosis Date  . Arthritis    lower back  . Brain bleed ([place]) 10/2016   AT [facility] NO SURGERY  . Confusion caused by a drug ([place])    fentanyl and oxycodone  . GERD (gastroesophageal reflux disease)   . Headache    past hx, secondary to nerve damage from accident  . Hypertension    CONTROLLED ON MEDS  . Neck pain, chronic   . Neuromuscular disorder ([place])    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  . Sleep apnea    Not currently using a C-Pap    Past Surgical History:  Procedure Laterality Date  . COLONOSCOPY WITH PROPOFOL N/A 02/11/2015   Procedure: COLONOSCOPY WITH PROPOFOL;  Surgeon: Lucilla Lame, MD;   Location: [facility];  Service: Endoscopy;  Laterality: N/A;  USES C-PAP  . NASAL SINUS SURGERY     due to severe sleep apnea  . NECK SURGERY     diffuse disc-and put in possibly screws patient thinks  . SHOULDER SURGERY Bilateral    x 2  . UMBILICAL HERNIA REPAIR N/A 02/26/2017   Primary repair.  Surgeon: Robert Bellow, MD;  Location: [facility];  Service: General;  Laterality: N/A;    Prior to Admission medications   Medication Sig Start Date End Date Taking? Authorizing Provider  ALPRAZolam Duanne Moron) 0.5 MG tablet Take 1 tablet (0.5 mg total) by mouth 2 (two) times daily as needed. 07/06/17  Yes Jerrol Banana., MD  Ascorbic Acid (VITAMIN C PO) Take by mouth. am   Yes [provider]  Aspirin-Salicylamide-Caffeine (BC HEADACHE POWDER PO) Take 1 tablet by mouth as needed.    Yes [provider]  B Complex Vitamins (VITAMIN B COMPLEX PO) Take by mouth. am   Yes [provider]  baclofen (LIORESAL) 10 MG tablet Take 5 mg by mouth 3 (three) times daily. STARTING AT LUNCHTIME 05/22/16  Yes [provider]  desvenlafaxine (PRISTIQ) 50 MG 24 hr tablet TAKE 1 TABLET BY MOUTH  EVERY DAY 11/10/17  Yes Jerrol Banana., MD  famotidine (PEPCID) 40 MG tablet Take 1 tablet (40 mg total) by mouth every evening. 09/10/17 09/10/18 Yes Nance Pear, MD  fentaNYL (DURAGESIC - DOSED MCG/HR) 50 MCG/HR Place 50 mcg onto the skin every 3 (three) days.  12/15/11  Yes [provider]  lansoprazole (PREVACID) 30 MG capsule  01/31/18  Yes [provider]  metoprolol succinate (TOPROL-XL) 50 MG 24 hr tablet TAKE 1 TABLET BY MOUTH DAILY 09/14/17  Yes Jerrol Banana., MD  Multiple Vitamin (MULTIVITAMIN) capsule Take 1 capsule by mouth daily.   Yes [provider]  oxyCODONE-acetaminophen (PERCOCET) 10-325 MG tablet Take 1 tablet by mouth every 4 (four) hours. 3AM, 7AM, 11AM, 3 PM, 7PM AND 11PM   Yes [provider]    pantoprazole (PROTONIX) 40 MG tablet Take 1 tablet (40 mg total) by mouth daily. 08/25/17  Yes Jerrol Banana., MD  pregabalin (LYRICA) 50 MG capsule  02/01/18  Yes [provider]  sucralfate (CARAFATE) 1 g tablet TAKE 1 TABLET BY MOUTH 4 TIMES A DAY WITH MEALS AND AT BEDTIME. 10/07/17  Yes Jerrol Banana., MD  fentaNYL (DURAGESIC - DOSED MCG/HR) 25 MCG/HR patch  01/25/18   [provider]  oxyCODONE-acetaminophen (PERCOCET) 10-325 MG tablet Take 1 tablet by mouth every 4 (four) hours as needed for pain. If needed for breakthrough pain at surgical site. Patient not taking: Reported on 02/08/2018 02/26/17 02/26/18  Robert Bellow, MD  promethazine (PHENERGAN) 25 MG tablet TAKE 1 TABLET BY MOUTH EVERY 8 HOURS AS NEEDED FOR NAUSEA OR VOMITING Patient not taking: Reported on 02/08/2018 08/12/17   Jerrol Banana., MD    Family History  Problem Relation Age of Onset  . Dementia Father   . Cancer Mother   . Cancer Sister   . Cancer Maternal Aunt      Social History   Tobacco Use  . Smoking status: Heavy Tobacco Smoker    Packs/day: 1.00    Years: 20.00    Pack years: 20.00    Types: Cigarettes  . Smokeless tobacco: Never Used  . Tobacco comment: Had quit for 8 yrs. Father died 2022-12-28. has had several cigarettes last few days.  Substance Use Topics  . Alcohol use: No  . Drug use: No    Allergies as of 02/08/2018 - Review Complete 02/08/2018  Allergen Reaction Noted  . Acetaminophen Shortness Of Breath 11/07/2015  . Codeine Itching and Nausea And Vomiting 09/22/2014  . Opana [oxymorphone] Swelling 12/24/2014    Review of Systems:    All systems reviewed and negative except where noted in HPI.   Physical Exam:  BP (!) 150/81 (BP Location: Left Arm, Patient Position: Sitting, Cuff Size: Large)   Pulse 66   Resp 16   Ht 5\' 8"  (1.727 m)   Wt 187 lb 12.8 oz (85.2 kg)   BMI 28.55 kg/m  No LMP for male patient. General:   Alert,  Well-developed,  well-nourished, pleasant and cooperative in NAD Head:  Normocephalic and atraumatic. Eyes:  Sclera clear, no icterus.   Conjunctiva pink. Ears:  Normal auditory acuity. Nose:  No deformity, discharge, or lesions. Mouth:  No deformity or lesions,oropharynx pink & moist. Neck:  Supple; no masses or thyromegaly. Lungs:  Respirations even and unlabored.  Clear throughout to auscultation.   No wheezes, crackles, or rhonchi. No acute distress. Heart:  Regular rate and rhythm; no murmurs, clicks, rubs,  or gallops. Abdomen:  Normal bowel sounds.  No bruits.  Soft, positive tenderness to one finger palpation while flexing the abdominal wall muscles by raising the patient's legs 6 inches above the exam table and non-distended without masses, hepatosplenomegaly or hernias noted.  No guarding or rebound tenderness.  Positive Carnett sign.   Rectal:  Deferred.  Msk:  Symmetrical without gross deformities.  Good, equal movement & strength bilaterally. Pulses:  Normal pulses noted. Extremities:  No clubbing or edema.  No cyanosis. Neurologic:  Alert and oriented x3;  grossly normal neurologically. Skin:  Intact without significant lesions or rashes.  No jaundice. Lymph Nodes:  No significant cervical adenopathy. Psych:  Alert and cooperative. Normal mood and affect.  Imaging Studies: No results found.  Assessment and Plan:   Joshua Guerrero is a 62 y.o. y/o male who comes in with abdominal pain and a history of chronic back pain.  The patient's physical exam shows his abdominal pain to be reproducible with one finger palpation while raising the legs while keeping the knee straight.  This has been explained to the patient that with his history of chronic back pain and his reproducible pain just by flexing his abdominal wall muscles that his symptoms are more likely to be musculoskeletal and no further investigation is needed.  The patient has also no GI symptoms associated with the pain.  He has been told to  use warm compresses and anti-inflammatories to help with his abdominal wall pain.  He also reports in hindsight that his Flexeril has helped his pain in the past.  The patient has been explained the plan and agrees with it.  Lucilla Lame, MD. [facility] Regal    Note: This dictation was prepared with Dragon dictation along with smaller phrase technology. Any transcriptional errors that result from this process are unintentional.

## 2018-03-04 DIAGNOSIS — M47817 Spondylosis without myelopathy or radiculopathy, lumbosacral region: Secondary | ICD-10-CM | POA: Diagnosis not present

## 2018-03-04 DIAGNOSIS — M961 Postlaminectomy syndrome, not elsewhere classified: Secondary | ICD-10-CM | POA: Diagnosis not present

## 2018-03-04 DIAGNOSIS — G894 Chronic pain syndrome: Secondary | ICD-10-CM | POA: Diagnosis not present

## 2018-03-04 DIAGNOSIS — M47812 Spondylosis without myelopathy or radiculopathy, cervical region: Secondary | ICD-10-CM | POA: Diagnosis not present

## 2018-03-07 ENCOUNTER — Encounter: Payer: Self-pay | Admitting: Family Medicine

## 2018-03-07 ENCOUNTER — Ambulatory Visit (INDEPENDENT_AMBULATORY_CARE_PROVIDER_SITE_OTHER): Payer: PPO | Admitting: Family Medicine

## 2018-03-07 VITALS — BP 144/86 | HR 60 | Temp 99.0°F | Resp 16 | Ht 68.0 in | Wt 187.0 lb

## 2018-03-07 DIAGNOSIS — F32A Depression, unspecified: Secondary | ICD-10-CM

## 2018-03-07 DIAGNOSIS — K219 Gastro-esophageal reflux disease without esophagitis: Secondary | ICD-10-CM

## 2018-03-07 DIAGNOSIS — M549 Dorsalgia, unspecified: Secondary | ICD-10-CM

## 2018-03-07 DIAGNOSIS — F329 Major depressive disorder, single episode, unspecified: Secondary | ICD-10-CM

## 2018-03-07 DIAGNOSIS — G8929 Other chronic pain: Secondary | ICD-10-CM

## 2018-03-07 DIAGNOSIS — Z23 Encounter for immunization: Secondary | ICD-10-CM

## 2018-03-07 MED ORDER — KETOROLAC TROMETHAMINE 60 MG/2ML IM SOLN
60.0000 mg | Freq: Once | INTRAMUSCULAR | Status: AC
  Administered 2018-03-07: 60 mg via INTRAMUSCULAR

## 2018-03-07 MED ORDER — DESVENLAFAXINE SUCCINATE ER 100 MG PO TB24: 100.0000 mg | ORAL_TABLET | Freq: Every day | ORAL | 5 refills | Status: DC

## 2018-03-07 NOTE — Progress Notes (Signed)
Patient: Joshua Guerrero Male    DOB: [birth year]   62 y.o.   MRN: [id number] Visit Date: 03/07/2018  Today's Provider: [name], MD   Chief Complaint  Patient presents with  . Follow-up  . Gastroesophageal Reflux  . Depression   Subjective:    HPI Patient comes in today for a 6 month follow up. Since last visit, patient was advised to change from prevacid to Protonix 40mg  daily for GERD symptoms. Patient reports that the protonix is not helping his symptoms. He has no additional symptoms beyond his reflux.  No exertional chest pain, no shortness of breath. He also reports that his depression is worsening. He is currently taking Pristiq 50mg  daily.     Depression screen [facility] 2/9 03/07/2018 08/25/2017 12/23/2016  Decreased Interest 3 1 1   Down, Depressed, Hopeless 2 1 1   PHQ - 2 Score 5 2 2   Altered sleeping 2 1 1   Tired, decreased energy 2 1 2   Change in appetite 2 0 1  Feeling bad or failure about yourself  1 1 0  Trouble concentrating 2 1 1   Moving slowly or fidgety/restless 0 1 2  Suicidal thoughts 0 0 0  PHQ-9 Score 14 7 9   Difficult doing work/chores Somewhat difficult Somewhat difficult Somewhat difficult     Allergies  Allergen Reactions  . Acetaminophen Shortness Of Breath  . Codeine Itching and Nausea And Vomiting  . Opana [Oxymorphone] Swelling     Current Outpatient Medications:  .  ALPRAZolam (XANAX) 0.5 MG tablet, Take 1 tablet (0.5 mg total) by mouth 2 (two) times daily as needed., Disp: 60 tablet, Rfl: 3 .  Ascorbic Acid (VITAMIN C PO), Take by mouth. am, Disp: , Rfl:  .  Aspirin-Salicylamide-Caffeine (BC HEADACHE POWDER PO), Take 1 tablet by mouth as needed. , Disp: , Rfl:  .  B Complex Vitamins (VITAMIN B COMPLEX PO), Take by mouth. am, Disp: , Rfl:  .  baclofen (LIORESAL) 10 MG tablet, Take 5 mg by mouth 3 (three) times daily. STARTING AT LUNCHTIME, Disp: , Rfl:  .  desvenlafaxine (PRISTIQ) 50 MG 24 hr tablet, TAKE 1 TABLET BY MOUTH  EVERY DAY, Disp: 90 tablet, Rfl: 1 .  famotidine (PEPCID) 40 MG tablet, Take 1 tablet (40 mg total) by mouth every evening., Disp: 30 tablet, Rfl: 1 .  fentaNYL (DURAGESIC - DOSED MCG/HR) 25 MCG/HR patch, , Disp: , Rfl:  .  lansoprazole (PREVACID) 30 MG capsule, , Disp: , Rfl:  .  metoprolol succinate (TOPROL-XL) 50 MG 24 hr tablet, TAKE 1 TABLET BY MOUTH DAILY, Disp: 90 tablet, Rfl: 1 .  Multiple Vitamin (MULTIVITAMIN) capsule, Take 1 capsule by mouth daily., Disp: , Rfl:  .  oxyCODONE-acetaminophen (PERCOCET) 10-325 MG tablet, Take 1 tablet by mouth every 4 (four) hours. 3AM, 7AM, 11AM, 3 PM, 7PM AND 11PM, Disp: , Rfl:  .  pregabalin (LYRICA) 50 MG capsule, , Disp: , Rfl:  .  sucralfate (CARAFATE) 1 g tablet, TAKE 1 TABLET BY MOUTH 4 TIMES A DAY WITH MEALS AND AT BEDTIME., Disp: 120 tablet, Rfl: 3 .  fentaNYL (DURAGESIC - DOSED MCG/HR) 50 MCG/HR, Place 50 mcg onto the skin every 3 (three) days. , Disp: , Rfl:  .  pantoprazole (PROTONIX) 40 MG tablet, Take 1 tablet (40 mg total) by mouth daily. (Patient not taking: Reported on 03/07/2018), Disp: 30 tablet, Rfl: 3 .  promethazine (PHENERGAN) 25 MG tablet, TAKE 1 TABLET BY MOUTH EVERY 8  HOURS AS NEEDED FOR NAUSEA OR VOMITING (Patient not taking: Reported on 02/08/2018), Disp: 35 tablet, Rfl: 1  Review of Systems  Constitutional: Negative for activity change, diaphoresis and fatigue.  HENT: Negative.   Eyes: Negative.   Respiratory: Negative for cough and shortness of breath.   Cardiovascular: Negative for chest pain, palpitations and leg swelling.  Gastrointestinal:       Reflux symptoms.  Endocrine: Negative.   Musculoskeletal: Positive for arthralgias, back pain and myalgias.  Allergic/Immunologic: Negative.   Neurological: Negative for dizziness, light-headedness and headaches.  Psychiatric/Behavioral: Positive for decreased concentration, sleep disturbance and suicidal ideas. Negative for self-injury. The patient is nervous/anxious.      Social History   Tobacco Use  . Smoking status: Heavy Tobacco Smoker    Packs/day: 1.00    Years: 20.00    Pack years: 20.00    Types: Cigarettes  . Smokeless tobacco: Never Used  . Tobacco comment: Had quit for 8 yrs. Father died January 02, 2023. has had several cigarettes last few days.  Substance Use Topics  . Alcohol use: No   Objective:   BP (!) 144/86 (BP Location: Left Arm, Patient Position: Sitting, Cuff Size: Normal)   Pulse 60   Temp 99 F (37.2 C)   Resp 16   Ht 5\' 8"  (1.727 m)   Wt 187 lb (84.8 kg)   SpO2 96%   BMI 28.43 kg/m  Vitals:   03/07/18 1530  BP: (!) 144/86  Pulse: 60  Resp: 16  Temp: 99 F (37.2 C)  SpO2: 96%  Weight: 187 lb (84.8 kg)  Height: 5\' 8"  (1.727 m)     Physical Exam  Constitutional: He is oriented to person, place, and time. He appears well-developed and well-nourished.  HENT:  Head: Normocephalic and atraumatic.  Right Ear: External ear normal.  Left Ear: External ear normal.  Nose: Nose normal.  Eyes: Conjunctivae are normal. No scleral icterus.  Neck: No thyromegaly present.  Cardiovascular: Normal rate, regular rhythm and normal heart sounds.  Pulmonary/Chest: Effort normal and breath sounds normal.  Abdominal: Soft. He exhibits no distension. There is no tenderness.  Musculoskeletal: He exhibits no edema.  Lymphadenopathy:    He has no cervical adenopathy.  Neurological: He is alert and oriented to person, place, and time.  Skin: Skin is dry.  Psychiatric: He has a normal mood and affect. His behavior is normal. Judgment and thought content normal.        Assessment & Plan:     1. Depression, unspecified depression type Increase Pristiq. - desvenlafaxine (PRISTIQ) 100 MG 24 hr tablet; Take 1 tablet (100 mg total) by mouth daily.  Dispense: 30 tablet; Refill: 5  2. Gastroesophageal reflux disease, esophagitis presence not specified Add Carafate to PPI.May need GI referral.  3. Chronic back pain, unspecified back  location, unspecified back pain laterality Per [facility]. - ketorolac (TORADOL) injection 60 mg  4. Need for influenza vaccination  - Flu Vaccine QUAD 6+ mos PF IM (Fluarix Quad PF)      I have done the exam and reviewed the above chart and it is accurate to the best of my knowledge. Development worker, community has been used in this note in any air is in the dictation or transcription are unintentional.  [name], MD  [place]

## 2018-03-15 ENCOUNTER — Ambulatory Visit: Payer: Self-pay | Admitting: Family Medicine

## 2018-03-16 ENCOUNTER — Ambulatory Visit (INDEPENDENT_AMBULATORY_CARE_PROVIDER_SITE_OTHER): Payer: PPO | Admitting: Family Medicine

## 2018-03-16 ENCOUNTER — Other Ambulatory Visit: Payer: Self-pay

## 2018-03-16 ENCOUNTER — Encounter: Payer: Self-pay | Admitting: Family Medicine

## 2018-03-16 VITALS — BP 136/72 | HR 64 | Temp 98.2°F | Ht 69.0 in | Wt 185.4 lb

## 2018-03-16 DIAGNOSIS — I10 Essential (primary) hypertension: Secondary | ICD-10-CM | POA: Diagnosis not present

## 2018-03-16 DIAGNOSIS — R12 Heartburn: Secondary | ICD-10-CM

## 2018-03-16 DIAGNOSIS — K219 Gastro-esophageal reflux disease without esophagitis: Secondary | ICD-10-CM | POA: Diagnosis not present

## 2018-03-16 MED ORDER — METOPROLOL SUCCINATE ER 50 MG PO TB24: 50.0000 mg | ORAL_TABLET | Freq: Every day | ORAL | 1 refills | Status: DC

## 2018-03-16 MED ORDER — SUCRALFATE 1 G PO TABS
ORAL_TABLET | ORAL | 3 refills | Status: DC
Start: 2018-03-16 — End: 2018-08-17

## 2018-03-16 MED ORDER — LANSOPRAZOLE 30 MG PO CPDR: 30.0000 mg | DELAYED_RELEASE_CAPSULE | Freq: Two times a day (BID) | ORAL | 3 refills | Status: DC

## 2018-03-16 NOTE — Progress Notes (Signed)
  Subjective:     Patient ID: Joshua Guerrero, male   DOB: 1955/07/05, 62 y.o.   MRN: [id number] Chief Complaint  Patient presents with  . Medication Refill    acid reflux, blood pressure  . Gastroesophageal Reflux    gotten worse   HPI Wishes to switch back to lansoprazole and requests refill on metoprolol and sucralfate. Has had prior negative Lipase and H.Bacter screens earlier this year.  Review of Systems     Objective:   Physical Exam  Constitutional: He appears well-developed and well-nourished. No distress.  Abdominal: There is tenderness (mild epigastric). There is no guarding.       Assessment:    1. Gastroesophageal reflux disease without esophagitis: refilled lansoprazole bid. 2. Essential hypertension: refilled metoprolol  3. Heartburn - sucralfate (CARAFATE) 1 g tablet; TAKE 1 TABLET BY MOUTH 4 TIMES A DAY WITH MEALS AND AT BEDTIME.  Dispense: 120 tablet; Refill: 3    Plan:    F/u with PCP as scheduled. May call if sx not improving for G.I. Referral.

## 2018-03-16 NOTE — Patient Instructions (Signed)
Let us know if heartburn pain not improving before your next visit.

## 2018-03-30 DIAGNOSIS — M961 Postlaminectomy syndrome, not elsewhere classified: Secondary | ICD-10-CM | POA: Diagnosis not present

## 2018-03-30 DIAGNOSIS — G894 Chronic pain syndrome: Secondary | ICD-10-CM | POA: Diagnosis not present

## 2018-03-30 DIAGNOSIS — Z79891 Long term (current) use of opiate analgesic: Secondary | ICD-10-CM | POA: Diagnosis not present

## 2018-03-30 DIAGNOSIS — M47817 Spondylosis without myelopathy or radiculopathy, lumbosacral region: Secondary | ICD-10-CM | POA: Diagnosis not present

## 2018-03-30 DIAGNOSIS — M47812 Spondylosis without myelopathy or radiculopathy, cervical region: Secondary | ICD-10-CM | POA: Diagnosis not present

## 2018-04-13 ENCOUNTER — Telehealth: Payer: Self-pay

## 2018-04-13 ENCOUNTER — Other Ambulatory Visit: Payer: Self-pay

## 2018-04-13 ENCOUNTER — Ambulatory Visit: Payer: Self-pay | Admitting: Family Medicine

## 2018-04-13 MED ORDER — PROMETHAZINE HCL 25 MG PO TABS: ORAL_TABLET | ORAL | 0 refills | Status: DC

## 2018-04-13 NOTE — Telephone Encounter (Signed)
Patient called the office to cancel his 2:20 appt due to nausea and vomiting. Patient reports that he has been vomiting for the last 3 days. He denies any diarrhea or bloody stools. He denies fever. He does not feel his symptoms are in relation to foods. He reports that he cant come in due to him getting sick on the car ride over here. Patient is requesting something to help with nausea and vomiting. He uses Consulting [employer]. Contact info is correct. Thanks!

## 2018-04-13 NOTE — Telephone Encounter (Signed)
Done

## 2018-04-21 ENCOUNTER — Ambulatory Visit: Payer: Self-pay | Admitting: Family Medicine

## 2018-05-03 DIAGNOSIS — M47812 Spondylosis without myelopathy or radiculopathy, cervical region: Secondary | ICD-10-CM | POA: Diagnosis not present

## 2018-05-03 DIAGNOSIS — M47817 Spondylosis without myelopathy or radiculopathy, lumbosacral region: Secondary | ICD-10-CM | POA: Diagnosis not present

## 2018-05-03 DIAGNOSIS — G894 Chronic pain syndrome: Secondary | ICD-10-CM | POA: Diagnosis not present

## 2018-05-03 DIAGNOSIS — M961 Postlaminectomy syndrome, not elsewhere classified: Secondary | ICD-10-CM | POA: Diagnosis not present

## 2018-05-12 ENCOUNTER — Other Ambulatory Visit: Payer: Self-pay | Admitting: Family Medicine

## 2018-05-16 ENCOUNTER — Ambulatory Visit (INDEPENDENT_AMBULATORY_CARE_PROVIDER_SITE_OTHER): Payer: PPO | Admitting: Family Medicine

## 2018-05-16 VITALS — BP 164/78 | HR 78 | Temp 98.2°F | Resp 18 | Wt 205.0 lb

## 2018-05-16 DIAGNOSIS — R112 Nausea with vomiting, unspecified: Secondary | ICD-10-CM | POA: Diagnosis not present

## 2018-05-16 DIAGNOSIS — G4733 Obstructive sleep apnea (adult) (pediatric): Secondary | ICD-10-CM | POA: Diagnosis not present

## 2018-05-16 DIAGNOSIS — F339 Major depressive disorder, recurrent, unspecified: Secondary | ICD-10-CM | POA: Diagnosis not present

## 2018-05-16 DIAGNOSIS — R1084 Generalized abdominal pain: Secondary | ICD-10-CM

## 2018-05-16 DIAGNOSIS — G8929 Other chronic pain: Secondary | ICD-10-CM | POA: Diagnosis not present

## 2018-05-16 DIAGNOSIS — K219 Gastro-esophageal reflux disease without esophagitis: Secondary | ICD-10-CM

## 2018-05-16 DIAGNOSIS — I10 Essential (primary) hypertension: Secondary | ICD-10-CM

## 2018-05-16 DIAGNOSIS — M549 Dorsalgia, unspecified: Secondary | ICD-10-CM | POA: Diagnosis not present

## 2018-05-16 MED ORDER — OMEPRAZOLE 40 MG PO CPDR
40.0000 mg | DELAYED_RELEASE_CAPSULE | Freq: Every day | ORAL | 5 refills | Status: DC
Start: 2018-05-16 — End: 2018-11-24

## 2018-05-16 MED ORDER — PROMETHAZINE HCL 25 MG PO TABS: ORAL_TABLET | ORAL | 0 refills | Status: DC

## 2018-05-16 NOTE — Progress Notes (Signed)
Joshua Guerrero  MRN: [id number] DOB: Jun 24, 1955  Subjective:  HPI   The patient is a 63 year old male who presents for follow up of his depression and acid reflux.  He was last seen on 03/07/18.  Depression-on his last visit the patient was instructed to increase his Pristiq to 100 mg daily.  The patient did not get his 100 mg Pristiq until about 1 week ago.  He states his pharmacy did had been giving him 50 mg and he thought it was the 100 mg.  He reports that he can tell that the increased dose is already starting to help him.  He thinks he will still need a higher dose but he can tell he is improving.  GERD-Patient was given Carafate to his medications and instructed that he may need to have referral to gastroenterology if this did not get him relief.  Patient had been taking Prevacid when he had the Carafate added.  He states he ran out of the Prevacid and bought some OTC Prilosec.  He is taking that twice daily and he reports it is helping him some.  He reports he has not been vomiting but is still nauseated all the time.    It is of note that the patient has had a weight gain since his visit from 185 to 205.  Patient Active Problem List   Diagnosis Date Noted  . Hypokalemia 05/26/2016  . Salicylate overdose 20/25/4270  . Hypogammaglobulinemia ([place]) 12/19/2015  . Benign prostatic hyperplasia 11/11/2015  . Personal history of colonic polyps   . Disease of colon   . Benign neoplasm of descending colon   . Benign neoplasm of sigmoid colon   . Anxiety 09/22/2014  . 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 Back pain, chronic 09/22/2014  . Chronic cervical pain 09/22/2014  . Clinical depression 09/22/2014  . Acid reflux 09/22/2014  . HLD (hyperlipidemia) 09/22/2014  . BP (high blood pressure) 09/22/2014  . Iron deficiency 09/22/2014  . Apnea, sleep 09/22/2014  . Umbilical hernia without obstruction and without gangrene 09/22/2014    Past Medical History:  Diagnosis Date  .  Arthritis    lower back  . Brain bleed ([place]) 10/2016   AT [facility]-TREATED MEDICALLY NO SURGERY  . Confusion caused by a drug ([facility])    fentanyl and oxycodone  . GERD (gastroesophageal reflux disease)   . Headache    past hx, secondary to nerve damage from accident  . Hypertension    CONTROLLED ON MEDS  . Neck pain, chronic   . Neuromuscular disorder ([place])    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  . Sleep apnea    Not currently using a C-Pap    Social History   Socioeconomic History  . Marital status: Divorced    Spouse name: single  . Number of children: 0  . Years of education: 70  . Highest education level: Not on file  Occupational History  . Occupation: disability  Social Needs  . Financial resource strain: Not on file  . Food insecurity:    Worry: Not on file    Inability: Not on file  . Transportation needs:    Medical: Not on file    Non-medical: Not on file  Tobacco Use  . Smoking status: Heavy Tobacco Smoker    Packs/day: 1.00    Years: 20.00    Pack years: 20.00    Types: Cigarettes  . Smokeless tobacco: Never Used  . Tobacco  comment: Had quit for 8 yrs. Father died 01/12/23. has had several cigarettes last few days.  Substance and Sexual Activity  . Alcohol use: No  . Drug use: No  . Sexual activity: Never  Lifestyle  . Physical activity:    Days per week: Not on file    Minutes per session: Not on file  . Stress: Not on file  Relationships  . Social connections:    Talks on phone: Not on file    Gets together: Not on file    Attends religious service: Not on file    Active member of club or organization: Not on file    Attends meetings of clubs or organizations: Not on file    Relationship status: Not on file  . Intimate partner violence:    Fear of current or ex partner: Not on file    Emotionally abused: Not on file    Physically abused: Not on file    Forced sexual activity: Not on file  Other Topics Concern  . Not on file  Social  History Narrative  . Not on file    Outpatient Encounter Medications as of 05/16/2018  Medication Sig  . Ascorbic Acid (VITAMIN C PO) Take by mouth. am  . Aspirin-Salicylamide-Caffeine (BC HEADACHE POWDER PO) Take 1 tablet by mouth as needed.   . B Complex Vitamins (VITAMIN B COMPLEX PO) Take by mouth. am  . baclofen (LIORESAL) 10 MG tablet Take 5 mg by mouth 3 (three) times daily. STARTING AT LUNCHTIME  . desvenlafaxine (PRISTIQ) 100 MG 24 hr tablet Take 1 tablet (100 mg total) by mouth daily.  . fentaNYL (DURAGESIC - DOSED MCG/HR) 25 MCG/HR patch   . fentaNYL (DURAGESIC - DOSED MCG/HR) 50 MCG/HR Place 50 mcg onto the skin every 3 (three) days.   . lansoprazole (PREVACID) 30 MG capsule Take 1 capsule (30 mg total) by mouth 2 (two) times daily before a meal.  . metoprolol succinate (TOPROL-XL) 50 MG 24 hr tablet Take 1 tablet (50 mg total) by mouth daily. Take with or immediately following a meal.  . Multiple Vitamin (MULTIVITAMIN) capsule Take 1 capsule by mouth daily.  Marland Kitchen oxyCODONE-acetaminophen (PERCOCET) 10-325 MG tablet Take 1 tablet by mouth every 4 (four) hours. 3AM, 7AM, 11AM, 3 PM, 7PM AND 11PM  . pregabalin (LYRICA) 50 MG capsule   . promethazine (PHENERGAN) 25 MG tablet TAKE 1 TABLET BY MOUTH EVERY 8 HOURS AS NEEDED FOR NAUSEA OR VOMITING  . sucralfate (CARAFATE) 1 g tablet TAKE 1 TABLET BY MOUTH 4 TIMES A DAY WITH MEALS AND AT BEDTIME.  Marland Kitchen ALPRAZolam (XANAX) 0.5 MG tablet Take 1 tablet (0.5 mg total) by mouth 2 (two) times daily as needed. (Patient not taking: Reported on 03/16/2018)  . famotidine (PEPCID) 40 MG tablet Take 1 tablet (40 mg total) by mouth every evening. (Patient not taking: Reported on 05/16/2018)   No facility-administered encounter medications on file as of 05/16/2018.     Allergies  Allergen Reactions  . Acetaminophen Shortness Of Breath  . Codeine Itching and Nausea And Vomiting  . Opana [Oxymorphone] Swelling    Review of Systems  Constitutional:  Positive for malaise/fatigue.  Eyes: Negative.   Respiratory: Negative for cough, shortness of breath and wheezing.   Cardiovascular: Positive for leg swelling. Negative for chest pain, palpitations, orthopnea and claudication.  Gastrointestinal: Positive for abdominal pain, constipation, heartburn and nausea. Negative for blood in stool, diarrhea and vomiting.  Musculoskeletal: Positive for back pain and neck pain.  Skin: Negative.   Endo/Heme/Allergies: Negative.   Psychiatric/Behavioral: Positive for depression. The patient is nervous/anxious.     Objective:  BP (!) 164/78 (BP Location: Right Arm, Patient Position: Sitting, Cuff Size: Normal)   Pulse 78   Temp 98.2 F (36.8 C) (Oral)   Resp 18   Wt 205 lb (93 kg)   SpO2 98%   BMI 30.27 kg/m   Physical Exam  Constitutional: He is oriented to person, place, and time and well-developed, well-nourished, and in no distress.  HENT:  Head: Normocephalic and atraumatic.  Eyes: Conjunctivae are normal. No scleral icterus.  Neck: No thyromegaly present.  Cardiovascular: Normal rate, regular rhythm and normal heart sounds.  Pulmonary/Chest: Effort normal and breath sounds normal.  Abdominal: Soft.  Musculoskeletal:        General: No edema.  Neurological: He is alert and oriented to person, place, and time. Gait normal. GCS score is 15.  Skin: Skin is warm and dry.  Psychiatric: Mood, memory, affect and judgment normal.    Assessment and Plan :   1. Gastroesophageal reflux disease without esophagitis  - omeprazole (PRILOSEC) 40 MG capsule; Take 1 capsule (40 mg total) by mouth daily.  Dispense: 30 capsule; Refill: 5  2. Essential hypertension  - Comprehensive metabolic panel - TSH  3. Generalized abdominal pain Patient has actually gained 20 pounds in the last 2 months. - CBC with Differential/Platelet - Lipase - Ambulatory referral to Gastroenterology  4. Mild nausea and vomiting  - promethazine (PHENERGAN) 25 MG  tablet; TAKE 1 TABLET BY MOUTH EVERY 8 HOURS AS NEEDED FOR NAUSEA OR VOMITING  Dispense: 20 tablet; Refill: 0  5. Obstructive sleep apnea syndrome   6. Chronic back pain, unspecified back location, unspecified back pain laterality Has follow-up with pain clinic.  7. Episode of recurrent major depressive disorder, unspecified depression episode severity ([place]) In partial remission.  Advised patient to go up on the dose of Pristiq as we discussed on the last visit.  Follow-up in 1 to 2 months.     HPI, Exam and A&P Transcribed under the direction and in the presence of [name]., MD. Electronically Signed: Althea Charon, [place]

## 2018-05-17 ENCOUNTER — Telehealth: Payer: Self-pay

## 2018-05-17 LAB — CBC WITH DIFFERENTIAL/PLATELET
Basophils Absolute: 0.1 x10E3/uL (ref 0.0–0.2)
Basos: 1 %
EOS (ABSOLUTE): 0.5 x10E3/uL — ABNORMAL HIGH (ref 0.0–0.4)
Eos: 7 %
Hematocrit: 35.6 % — ABNORMAL LOW (ref 37.5–51.0)
Hemoglobin: 11.8 g/dL — ABNORMAL LOW (ref 13.0–17.7)
Immature Grans (Abs): 0.1 x10E3/uL (ref 0.0–0.1)
Immature Granulocytes: 1 %
Lymphocytes Absolute: 2 x10E3/uL (ref 0.7–3.1)
Lymphs: 29 %
MCH: 31.1 pg (ref 26.6–33.0)
MCHC: 33.1 g/dL (ref 31.5–35.7)
MCV: 94 fL (ref 79–97)
Monocytes Absolute: 0.7 x10E3/uL (ref 0.1–0.9)
Monocytes: 10 %
Neutrophils Absolute: 3.6 x10E3/uL (ref 1.4–7.0)
Neutrophils: 52 %
Platelets: 371 x10E3/uL (ref 150–450)
RBC: 3.8 x10E6/uL — ABNORMAL LOW (ref 4.14–5.80)
RDW: 14.8 % (ref 11.6–15.4)
WBC: 6.9 x10E3/uL (ref 3.4–10.8)

## 2018-05-17 LAB — COMPREHENSIVE METABOLIC PANEL WITH GFR
ALT: 6 IU/L (ref 0–44)
AST: 13 IU/L (ref 0–40)
Albumin/Globulin Ratio: 1.7 (ref 1.2–2.2)
Albumin: 3.6 g/dL (ref 3.6–4.8)
Alkaline Phosphatase: 50 IU/L (ref 39–117)
BUN/Creatinine Ratio: 9 — ABNORMAL LOW (ref 10–24)
BUN: 7 mg/dL — ABNORMAL LOW (ref 8–27)
Bilirubin Total: <0.2 mg/dL (ref 0.0–1.2)
CO2: 30 mmol/L — ABNORMAL HIGH (ref 20–29)
Calcium: 8.5 mg/dL — ABNORMAL LOW (ref 8.6–10.2)
Chloride: 101 mmol/L (ref 96–106)
Creatinine, Ser: 0.8 mg/dL (ref 0.76–1.27)
GFR calc Af Amer: 112 mL/min/1.73
GFR calc non Af Amer: 97 mL/min/1.73
Globulin, Total: 2.1 g/dL (ref 1.5–4.5)
Glucose: 89 mg/dL (ref 65–99)
Potassium: 3.9 mmol/L (ref 3.5–5.2)
Sodium: 143 mmol/L (ref 134–144)
Total Protein: 5.7 g/dL — ABNORMAL LOW (ref 6.0–8.5)

## 2018-05-17 LAB — TSH: TSH: 1.11 u[IU]/mL (ref 0.450–4.500)

## 2018-05-17 LAB — LIPASE: Lipase: 14 U/L (ref 13–78)

## 2018-05-17 NOTE — Telephone Encounter (Signed)
Patient was advised.  

## 2018-05-17 NOTE — Telephone Encounter (Signed)
[facility] 

## 2018-05-17 NOTE — Telephone Encounter (Signed)
-----   Message from Jerrol Banana., MD sent at 05/17/2018  8:14 AM EST ----- Labs stable.  Repeat CBC on next visit.

## 2018-06-03 DIAGNOSIS — M961 Postlaminectomy syndrome, not elsewhere classified: Secondary | ICD-10-CM | POA: Diagnosis not present

## 2018-06-03 DIAGNOSIS — M47817 Spondylosis without myelopathy or radiculopathy, lumbosacral region: Secondary | ICD-10-CM | POA: Diagnosis not present

## 2018-06-03 DIAGNOSIS — M47812 Spondylosis without myelopathy or radiculopathy, cervical region: Secondary | ICD-10-CM | POA: Diagnosis not present

## 2018-06-03 DIAGNOSIS — G894 Chronic pain syndrome: Secondary | ICD-10-CM | POA: Diagnosis not present

## 2018-06-28 ENCOUNTER — Encounter: Payer: Self-pay | Admitting: Gastroenterology

## 2018-06-28 ENCOUNTER — Ambulatory Visit (INDEPENDENT_AMBULATORY_CARE_PROVIDER_SITE_OTHER): Payer: PPO | Admitting: Gastroenterology

## 2018-06-28 VITALS — BP 155/76 | HR 70 | Ht 69.0 in | Wt 193.8 lb

## 2018-06-28 DIAGNOSIS — R1084 Generalized abdominal pain: Secondary | ICD-10-CM | POA: Diagnosis not present

## 2018-06-28 MED ORDER — PANTOPRAZOLE SODIUM 40 MG PO TBEC: 40.0000 mg | DELAYED_RELEASE_TABLET | Freq: Every day | ORAL | 6 refills | Status: DC

## 2018-06-28 NOTE — Patient Instructions (Signed)
You are scheduled for a  CT abdomen/Pelvis at [facility] outpatient imaging, [facility] location on Friday, March 6th at 10:00am. Please arrive at 9:45am. You cannot have anything to eat or drink 4 hours prior to scan. You will need to pick up the contrast media before the scan.  If you need to reschedule this appointment for any reason, please contact central scheduling at [phone].

## 2018-06-28 NOTE — Progress Notes (Addendum)
Primary Care Physician: Jerrol Banana., MD  Primary Gastroenterologist:  Dr. Lucilla Lame  Chief Complaint  Patient presents with  . Abdominal Pain    HPI: Joshua Guerrero is a 63 y.o. male here who comes today for lower abdominal pain. The patient had seen me in the past for abdominal pain and was found to have musculoskeletal pain.  The patient states that his abdominal pain has increased since his fentanyl patch was decreased in dose.  The patient denies any black stools or bloody stools but he has been taking Alka-Seltzer for the pain.  The patient also reports that he is taking Prilosec, Prevacid and Carafate.  His abdominal pain is made better when he eats.  He reports that all of his pain is in the lower abdomen on both sides and not anywhere near the epigastric area.  Current Outpatient Medications  Medication Sig Dispense Refill  . Ascorbic Acid (VITAMIN C PO) Take by mouth. am    . Aspirin-Salicylamide-Caffeine (BC HEADACHE POWDER PO) Take 1 tablet by mouth as needed.     . B Complex Vitamins (VITAMIN B COMPLEX PO) Take by mouth. am    . baclofen (LIORESAL) 10 MG tablet Take 5 mg by mouth 3 (three) times daily. STARTING AT LUNCHTIME    . desvenlafaxine (PRISTIQ) 100 MG 24 hr tablet Take 1 tablet (100 mg total) by mouth daily. 30 tablet 5  . famotidine (PEPCID) 40 MG tablet Take 1 tablet (40 mg total) by mouth every evening. 30 tablet 1  . fentaNYL (DURAGESIC - DOSED MCG/HR) 25 MCG/HR patch     . fentaNYL (DURAGESIC - DOSED MCG/HR) 50 MCG/HR Place 50 mcg onto the skin every 3 (three) days.     . lansoprazole (PREVACID) 30 MG capsule Take 1 capsule (30 mg total) by mouth 2 (two) times daily before a meal. 180 capsule 3  . metoprolol succinate (TOPROL-XL) 50 MG 24 hr tablet Take 1 tablet (50 mg total) by mouth daily. Take with or immediately following a meal. 90 tablet 1  . Multiple Vitamin (MULTIVITAMIN) capsule Take 1 capsule by mouth daily.    Marland Kitchen omeprazole (PRILOSEC) 40  MG capsule Take 1 capsule (40 mg total) by mouth daily. 30 capsule 5  . oxyCODONE-acetaminophen (PERCOCET) 10-325 MG tablet Take 1 tablet by mouth every 4 (four) hours. 3AM, 7AM, 11AM, 3 PM, 7PM AND 11PM    . pregabalin (LYRICA) 50 MG capsule     . promethazine (PHENERGAN) 25 MG tablet TAKE 1 TABLET BY MOUTH EVERY 8 HOURS AS NEEDED FOR NAUSEA OR VOMITING 20 tablet 0  . sucralfate (CARAFATE) 1 g tablet TAKE 1 TABLET BY MOUTH 4 TIMES A DAY WITH MEALS AND AT BEDTIME. 120 tablet 3  . ALPRAZolam (XANAX) 0.5 MG tablet Take 1 tablet (0.5 mg total) by mouth 2 (two) times daily as needed. (Patient not taking: Reported on 06/28/2018) 60 tablet 3   No current facility-administered medications for this visit.     Allergies as of 06/28/2018 - Review Complete 06/28/2018  Allergen Reaction Noted  . Acetaminophen Shortness Of Breath 11/07/2015  . Codeine Itching and Nausea And Vomiting 09/22/2014  . Opana [oxymorphone] Swelling 12/24/2014    ROS:  General: Negative for anorexia, weight loss, fever, chills, fatigue, weakness. ENT: Negative for hoarseness, difficulty swallowing , nasal congestion. CV: Negative for chest pain, angina, palpitations, dyspnea on exertion, peripheral edema.  Respiratory: Negative for dyspnea at rest, dyspnea on exertion, cough, sputum, wheezing.  GI:  See history of present illness. GU:  Negative for dysuria, hematuria, urinary incontinence, urinary frequency, nocturnal urination.  Endo: Negative for unusual weight change.    Physical Examination:   BP (!) 155/76   Pulse 70   Ht 5\' 9"  (1.753 m)   Wt 193 lb 12.8 oz (87.9 kg)   BMI 28.62 kg/m   General: Well-nourished, well-developed in no acute distress.  Eyes: No icterus. Conjunctivae pink. Mouth: Oropharyngeal mucosa moist and pink , no lesions erythema or exudate. Lungs: Clear to auscultation bilaterally. Non-labored. Heart: Regular rate and rhythm, no murmurs rubs or gallops.  Abdomen: Bowel sounds are normal,  Mild tenderness bilaterally in the lower abdomen, nondistended, no hepatosplenomegaly or masses, no abdominal bruits or hernia , no rebound or guarding.   Extremities: No lower extremity edema. No clubbing or deformities. Neuro: Alert and oriented x 3.  Grossly intact. Skin: Warm and dry, no jaundice.   Psych: Alert and cooperative, normal mood and affect.  Labs:    Imaging Studies: No results found.  Assessment and Plan:   Joshua Guerrero is a 63 y.o. y/o male with abdominal pain that is in the lower abdomen that here describes his burning pain. This still may be a component of musculoskeletal pain as the cause of this patient's symptoms although he states this is different pain and he does not believe it is musculoskeletal.  The patient will be set up for a CT scan of the abdomen and pelvis.  The patient will also have his Prilosec change to Protonix to try and help since he reports that he has frequent nausea which may be a side effect of his reflux.  The patient will be contacted with the results of the CT scan.  The patient has been explained the plan and agrees with it.    Lucilla Lame, MD. [facility]   Note: This dictation was prepared with Dragon dictation along with smaller phrase technology. Any transcriptional errors that result from this process are unintentional.

## 2018-07-01 DIAGNOSIS — M961 Postlaminectomy syndrome, not elsewhere classified: Secondary | ICD-10-CM | POA: Diagnosis not present

## 2018-07-01 DIAGNOSIS — G894 Chronic pain syndrome: Secondary | ICD-10-CM | POA: Diagnosis not present

## 2018-07-01 DIAGNOSIS — M47812 Spondylosis without myelopathy or radiculopathy, cervical region: Secondary | ICD-10-CM | POA: Diagnosis not present

## 2018-07-01 DIAGNOSIS — M47817 Spondylosis without myelopathy or radiculopathy, lumbosacral region: Secondary | ICD-10-CM | POA: Diagnosis not present

## 2018-07-06 NOTE — Progress Notes (Deleted)
Patient: Joshua Guerrero Male    DOB: [birth year]   63 y.o.   MRN: [id number] Visit Date: 07/06/2018  Today's Provider: Vernie Murders, PA   No chief complaint on file.  Subjective:     HPI  Allergies  Allergen Reactions  . Acetaminophen Shortness Of Breath  . Codeine Itching and Nausea And Vomiting  . Opana [Oxymorphone] Swelling     Current Outpatient Medications:  .  ALPRAZolam (XANAX) 0.5 MG tablet, Take 1 tablet (0.5 mg total) by mouth 2 (two) times daily as needed. (Patient not taking: Reported on 06/28/2018), Disp: 60 tablet, Rfl: 3 .  Ascorbic Acid (VITAMIN C PO), Take by mouth. am, Disp: , Rfl:  .  Aspirin-Salicylamide-Caffeine (BC HEADACHE POWDER PO), Take 1 tablet by mouth as needed. , Disp: , Rfl:  .  B Complex Vitamins (VITAMIN B COMPLEX PO), Take by mouth. am, Disp: , Rfl:  .  baclofen (LIORESAL) 10 MG tablet, Take 5 mg by mouth 3 (three) times daily. STARTING AT LUNCHTIME, Disp: , Rfl:  .  desvenlafaxine (PRISTIQ) 100 MG 24 hr tablet, Take 1 tablet (100 mg total) by mouth daily., Disp: 30 tablet, Rfl: 5 .  famotidine (PEPCID) 40 MG tablet, Take 1 tablet (40 mg total) by mouth every evening., Disp: 30 tablet, Rfl: 1 .  fentaNYL (DURAGESIC - DOSED MCG/HR) 25 MCG/HR patch, , Disp: , Rfl:  .  fentaNYL (DURAGESIC - DOSED MCG/HR) 50 MCG/HR, Place 50 mcg onto the skin every 3 (three) days. , Disp: , Rfl:  .  lansoprazole (PREVACID) 30 MG capsule, Take 1 capsule (30 mg total) by mouth 2 (two) times daily before a meal., Disp: 180 capsule, Rfl: 3 .  metoprolol succinate (TOPROL-XL) 50 MG 24 hr tablet, Take 1 tablet (50 mg total) by mouth daily. Take with or immediately following a meal., Disp: 90 tablet, Rfl: 1 .  Multiple Vitamin (MULTIVITAMIN) capsule, Take 1 capsule by mouth daily., Disp: , Rfl:  .  omeprazole (PRILOSEC) 40 MG capsule, Take 1 capsule (40 mg total) by mouth daily., Disp: 30 capsule, Rfl: 5 .  oxyCODONE-acetaminophen (PERCOCET) 10-325 MG tablet, Take 1  tablet by mouth every 4 (four) hours. 3AM, 7AM, 11AM, 3 PM, 7PM AND 11PM, Disp: , Rfl:  .  pantoprazole (PROTONIX) 40 MG tablet, Take 1 tablet (40 mg total) by mouth daily., Disp: 30 tablet, Rfl: 6 .  pregabalin (LYRICA) 50 MG capsule, , Disp: , Rfl:  .  promethazine (PHENERGAN) 25 MG tablet, TAKE 1 TABLET BY MOUTH EVERY 8 HOURS AS NEEDED FOR NAUSEA OR VOMITING, Disp: 20 tablet, Rfl: 0 .  sucralfate (CARAFATE) 1 g tablet, TAKE 1 TABLET BY MOUTH 4 TIMES A DAY WITH MEALS AND AT BEDTIME., Disp: 120 tablet, Rfl: 3  Review of Systems  Constitutional: Negative for appetite change, chills and fever.  Respiratory: Negative for chest tightness, shortness of breath and wheezing.   Cardiovascular: Negative for chest pain and palpitations.  Gastrointestinal: Positive for abdominal pain and nausea. Negative for vomiting.    Social History   Tobacco Use  . Smoking status: Heavy Tobacco Smoker    Packs/day: 1.00    Years: 20.00    Pack years: 20.00    Types: Cigarettes  . Smokeless tobacco: Never Used  . Tobacco comment: Had quit for 8 yrs. Father died 16-Jan-2023. has had several cigarettes last few days.  Substance Use Topics  . Alcohol use: No      Objective:  There were no vitals taken for this visit. There were no vitals filed for this visit.   Physical Exam      Assessment & Plan        Vernie Murders, PA  [facility]

## 2018-07-07 ENCOUNTER — Ambulatory Visit: Payer: Self-pay | Admitting: Family Medicine

## 2018-07-08 ENCOUNTER — Ambulatory Visit (INDEPENDENT_AMBULATORY_CARE_PROVIDER_SITE_OTHER): Payer: PPO | Admitting: Family Medicine

## 2018-07-08 ENCOUNTER — Encounter: Payer: Self-pay | Admitting: Family Medicine

## 2018-07-08 ENCOUNTER — Ambulatory Visit: Admission: RE | Admit: 2018-07-08 | Payer: PPO | Source: Ambulatory Visit

## 2018-07-08 VITALS — BP 138/80 | HR 68 | Temp 99.1°F | Resp 16 | Wt 193.0 lb

## 2018-07-08 DIAGNOSIS — R110 Nausea: Secondary | ICD-10-CM

## 2018-07-08 DIAGNOSIS — K219 Gastro-esophageal reflux disease without esophagitis: Secondary | ICD-10-CM

## 2018-07-08 DIAGNOSIS — R1084 Generalized abdominal pain: Secondary | ICD-10-CM | POA: Diagnosis not present

## 2018-07-08 MED ORDER — PROMETHAZINE HCL 25 MG PO TABS: ORAL_TABLET | ORAL | 0 refills | Status: DC

## 2018-07-08 NOTE — Progress Notes (Signed)
Patient: Joshua Guerrero Male    DOB: [birth year]   63 y.o.   MRN: [id number] Visit Date: 07/08/2018  Today's Provider: Vernie Murders, PA   Chief Complaint  Patient presents with  . Nausea    Has been going on for several months.   Subjective:     Emesis   This is a chronic problem. Episode onset: This has been going on for several months. The problem has been waxing and waning. Associated symptoms include abdominal pain, chills and a fever. Pertinent negatives include no arthralgias, chest pain, coughing, diarrhea, dizziness, headaches, myalgias, sweats, URI or weight loss.   Past Medical History:  Diagnosis Date  . Arthritis    lower back  . Brain bleed ([place]) 10/2016   AT [facility] NO SURGERY  . Confusion caused by a drug ([place])    fentanyl and oxycodone  . GERD (gastroesophageal reflux disease)   . Headache    past hx, secondary to nerve damage from accident  . Hypertension    CONTROLLED ON MEDS  . Neck pain, chronic   . Neuromuscular disorder ([place])    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  . Sleep apnea    Not currently using a C-Pap   Past Surgical History:  Procedure Laterality Date  . COLONOSCOPY WITH PROPOFOL N/A 02/11/2015   Procedure: COLONOSCOPY WITH PROPOFOL;  Surgeon: Lucilla Lame, MD;  Location: [place];  Service: Endoscopy;  Laterality: N/A;  USES C-PAP  . NASAL SINUS SURGERY     due to severe sleep apnea  . NECK SURGERY     diffuse disc-and put in possibly screws patient thinks  . SHOULDER SURGERY Bilateral    x 2  . UMBILICAL HERNIA REPAIR N/A 02/26/2017   Primary repair.  Surgeon: Robert Bellow, MD;  Location: [facility];  Service: General;  Laterality: N/A;   Family History  Problem Relation Age of Onset  . Dementia Father   . Cancer Mother   . Cancer Sister   . Cancer Maternal Aunt    Allergies  Allergen Reactions  . Acetaminophen Shortness Of Breath  . Codeine Itching and Nausea And Vomiting    . Opana [Oxymorphone] Swelling    Current Outpatient Medications:  .  Ascorbic Acid (VITAMIN C PO), Take by mouth. am, Disp: , Rfl:  .  B Complex Vitamins (VITAMIN B COMPLEX PO), Take by mouth. am, Disp: , Rfl:  .  baclofen (LIORESAL) 10 MG tablet, Take 5 mg by mouth 3 (three) times daily. STARTING AT LUNCHTIME, Disp: , Rfl:  .  desvenlafaxine (PRISTIQ) 100 MG 24 hr tablet, Take 1 tablet (100 mg total) by mouth daily., Disp: 30 tablet, Rfl: 5 .  famotidine (PEPCID) 40 MG tablet, Take 1 tablet (40 mg total) by mouth every evening., Disp: 30 tablet, Rfl: 1 .  fentaNYL (DURAGESIC - DOSED MCG/HR) 25 MCG/HR patch, , Disp: , Rfl:  .  fentaNYL (DURAGESIC - DOSED MCG/HR) 50 MCG/HR, Place 50 mcg onto the skin every 3 (three) days. , Disp: , Rfl:  .  lansoprazole (PREVACID) 30 MG capsule, Take 1 capsule (30 mg total) by mouth 2 (two) times daily before a meal., Disp: 180 capsule, Rfl: 3 .  metoprolol succinate (TOPROL-XL) 50 MG 24 hr tablet, Take 1 tablet (50 mg total) by mouth daily. Take with or immediately following a meal., Disp: 90 tablet, Rfl: 1 .  Multiple Vitamin (MULTIVITAMIN) capsule, Take 1 capsule by mouth  daily., Disp: , Rfl:  .  omeprazole (PRILOSEC) 40 MG capsule, Take 1 capsule (40 mg total) by mouth daily., Disp: 30 capsule, Rfl: 5 .  oxyCODONE-acetaminophen (PERCOCET) 10-325 MG tablet, Take 1 tablet by mouth every 4 (four) hours. 3AM, 7AM, 11AM, 3 PM, 7PM AND 11PM, Disp: , Rfl:  .  pantoprazole (PROTONIX) 40 MG tablet, Take 1 tablet (40 mg total) by mouth daily., Disp: 30 tablet, Rfl: 6 .  pregabalin (LYRICA) 50 MG capsule, , Disp: , Rfl:  .  sucralfate (CARAFATE) 1 g tablet, TAKE 1 TABLET BY MOUTH 4 TIMES A DAY WITH MEALS AND AT BEDTIME., Disp: 120 tablet, Rfl: 3 .  ALPRAZolam (XANAX) 0.5 MG tablet, Take 1 tablet (0.5 mg total) by mouth 2 (two) times daily as needed. (Patient not taking: Reported on 06/28/2018), Disp: 60 tablet, Rfl: 3 .  Aspirin-Salicylamide-Caffeine (BC HEADACHE  POWDER PO), Take 1 tablet by mouth as needed. , Disp: , Rfl:  .  promethazine (PHENERGAN) 25 MG tablet, TAKE 1 TABLET BY MOUTH EVERY 8 HOURS AS NEEDED FOR NAUSEA OR VOMITING (Patient not taking: Reported on 07/08/2018), Disp: 20 tablet, Rfl: 0  Review of Systems  Constitutional: Positive for chills, fatigue and fever. Negative for activity change, appetite change, diaphoresis, unexpected weight change and weight loss.  Respiratory: Negative for cough.   Cardiovascular: Negative for chest pain.  Gastrointestinal: Positive for abdominal pain, blood in stool, nausea and vomiting. Negative for abdominal distention, anal bleeding, constipation, diarrhea and rectal pain.  Musculoskeletal: Negative for arthralgias and myalgias.  Neurological: Negative for dizziness and headaches.   Social History   Tobacco Use  . Smoking status: Heavy Tobacco Smoker    Packs/day: 1.00    Years: 20.00    Pack years: 20.00    Types: Cigarettes  . Smokeless tobacco: Never Used  . Tobacco comment: Had quit for 8 yrs. Father died 01-09-2023. has had several cigarettes last few days.  Substance Use Topics  . Alcohol use: No     Objective:   BP 138/80 (BP Location: Right Arm, Patient Position: Sitting, Cuff Size: Normal)   Pulse 68   Temp 99.1 F (37.3 C) (Oral)   Resp 16   Wt 193 lb (87.5 kg)   BMI 28.50 kg/m  Vitals:   07/08/18 1427  BP: 138/80  Pulse: 68  Resp: 16  Temp: 99.1 F (37.3 C)  TempSrc: Oral  Weight: 193 lb (87.5 kg)   Physical Exam Constitutional:      General: He is not in acute distress.    Appearance: He is well-developed.  HENT:     Head: Normocephalic and atraumatic.     Right Ear: Hearing normal.     Left Ear: Hearing normal.     Nose: Nose normal.  Eyes:     General: Lids are normal. No scleral icterus.       Right eye: No discharge.        Left eye: No discharge.     Conjunctiva/sclera: Conjunctivae normal.  Pulmonary:     Effort: Pulmonary effort is normal. No respiratory  distress.  Musculoskeletal: Normal range of motion.  Skin:    Findings: No lesion or rash.  Neurological:     Mental Status: He is alert and oriented to person, place, and time.  Psychiatric:        Speech: Speech normal.        Behavior: Behavior normal.        Thought Content: Thought content normal.  Assessment & Plan    1. Chronic nausea Burning pain in abdomen and nausea with rare vomiting over the past month. Changing Fentanyl dosage and using the Phenergan helped initially, but the nausea continues. Was evaluated by gastroenterologist on 06-28-18 and scheduled for CT scan with possible EGD. Requests refill of Phenergan to use prn to reduce nausea. Proceed with follow up with Dr. Allen Norris (GI). - promethazine (PHENERGAN) 25 MG tablet; TAKE 1 TABLET BY MOUTH EVERY 8 HOURS AS NEEDED FOR NAUSEA OR VOMITING  Dispense: 30 tablet; Refill: 0  2. Gastroesophageal reflux disease without esophagitis Still taking the Prevacid/Prilosec with Carafate since the Protonix prescribed by Dr. Allen Norris, did not help discomfort and dyspepsia. States nausea is the most prominent symptoms. No hematemesis. Scheduled for CT of abdomin and possibly get upper endoscopy if no pathology identified.  3. Generalized abdominal pain Unchanged mild generalized abdominal discomfort evaluated by Dr. Allen Norris (GI) on 06-28-18. Scheduled for CT scan 07-13-18 and to follow up with GI pending reports. No diarrhea, constipation, melena, hematemesis, distension or masses/organomegaly.     Vernie Murders, PA  [facility]

## 2018-07-13 ENCOUNTER — Ambulatory Visit: Payer: PPO

## 2018-07-18 ENCOUNTER — Ambulatory Visit
Admission: RE | Admit: 2018-07-18 | Discharge: 2018-07-18 | Disposition: A | Discharge status: Home / Self Care | Payer: PPO | Source: Ambulatory Visit | Attending: Gastroenterology | Admitting: Gastroenterology

## 2018-07-18 ENCOUNTER — Other Ambulatory Visit: Payer: Self-pay

## 2018-07-18 DIAGNOSIS — R1084 Generalized abdominal pain: Secondary | ICD-10-CM | POA: Diagnosis not present

## 2018-07-18 DIAGNOSIS — N289 Disorder of kidney and ureter, unspecified: Secondary | ICD-10-CM | POA: Diagnosis not present

## 2018-07-18 LAB — POCT I-STAT CREATININE: CREATININE: 0.7 mg/dL (ref 0.61–1.24)

## 2018-07-18 MED ORDER — IOHEXOL 300 MG/ML  SOLN
100.0000 mL | Freq: Once | INTRAMUSCULAR | Status: AC | PRN
  Administered 2018-07-18: 100 mL via INTRAVENOUS

## 2018-07-22 ENCOUNTER — Other Ambulatory Visit: Payer: Self-pay

## 2018-07-22 ENCOUNTER — Telehealth: Payer: Self-pay | Admitting: Gastroenterology

## 2018-07-22 DIAGNOSIS — R110 Nausea: Secondary | ICD-10-CM

## 2018-07-22 MED ORDER — PROMETHAZINE HCL 25 MG PO TABS: ORAL_TABLET | ORAL | 2 refills | Status: DC

## 2018-07-22 NOTE — Telephone Encounter (Signed)
Patient called l/m on v/m he would like his results on his CT scan from Monday.

## 2018-07-22 NOTE — Telephone Encounter (Signed)
Pt notified of CT scan results.  

## 2018-07-29 DIAGNOSIS — M47812 Spondylosis without myelopathy or radiculopathy, cervical region: Secondary | ICD-10-CM | POA: Diagnosis not present

## 2018-07-29 DIAGNOSIS — G894 Chronic pain syndrome: Secondary | ICD-10-CM | POA: Diagnosis not present

## 2018-07-29 DIAGNOSIS — M961 Postlaminectomy syndrome, not elsewhere classified: Secondary | ICD-10-CM | POA: Diagnosis not present

## 2018-07-29 DIAGNOSIS — M47817 Spondylosis without myelopathy or radiculopathy, lumbosacral region: Secondary | ICD-10-CM | POA: Diagnosis not present

## 2018-08-03 ENCOUNTER — Ambulatory Visit: Payer: Self-pay | Admitting: Family Medicine

## 2018-08-04 ENCOUNTER — Ambulatory Visit: Payer: Self-pay | Admitting: Family Medicine

## 2018-08-08 ENCOUNTER — Ambulatory Visit: Payer: Self-pay | Admitting: Family Medicine

## 2018-08-10 ENCOUNTER — Ambulatory Visit: Payer: Self-pay | Admitting: Family Medicine

## 2018-08-17 ENCOUNTER — Other Ambulatory Visit: Payer: Self-pay

## 2018-08-17 ENCOUNTER — Encounter: Payer: Self-pay | Admitting: Family Medicine

## 2018-08-17 ENCOUNTER — Ambulatory Visit (INDEPENDENT_AMBULATORY_CARE_PROVIDER_SITE_OTHER): Payer: PPO | Admitting: Family Medicine

## 2018-08-17 VITALS — BP 136/78 | HR 80 | Temp 98.8°F | Ht 69.0 in | Wt 195.0 lb

## 2018-08-17 DIAGNOSIS — G4733 Obstructive sleep apnea (adult) (pediatric): Secondary | ICD-10-CM | POA: Diagnosis not present

## 2018-08-17 DIAGNOSIS — R110 Nausea: Secondary | ICD-10-CM | POA: Diagnosis not present

## 2018-08-17 DIAGNOSIS — I10 Essential (primary) hypertension: Secondary | ICD-10-CM

## 2018-08-17 DIAGNOSIS — R12 Heartburn: Secondary | ICD-10-CM | POA: Diagnosis not present

## 2018-08-17 DIAGNOSIS — G8929 Other chronic pain: Secondary | ICD-10-CM

## 2018-08-17 DIAGNOSIS — S32030A Wedge compression fracture of third lumbar vertebra, initial encounter for closed fracture: Secondary | ICD-10-CM | POA: Diagnosis not present

## 2018-08-17 DIAGNOSIS — W19XXXA Unspecified fall, initial encounter: Secondary | ICD-10-CM | POA: Diagnosis not present

## 2018-08-17 DIAGNOSIS — M549 Dorsalgia, unspecified: Secondary | ICD-10-CM

## 2018-08-17 DIAGNOSIS — F330 Major depressive disorder, recurrent, mild: Secondary | ICD-10-CM | POA: Diagnosis not present

## 2018-08-17 DIAGNOSIS — Z23 Encounter for immunization: Secondary | ICD-10-CM

## 2018-08-17 DIAGNOSIS — S80819A Abrasion, unspecified lower leg, initial encounter: Secondary | ICD-10-CM | POA: Diagnosis not present

## 2018-08-17 DIAGNOSIS — S062X0A Diffuse traumatic brain injury without loss of consciousness, initial encounter: Secondary | ICD-10-CM

## 2018-08-17 MED ORDER — ONDANSETRON 4 MG PO TBDP: 4.0000 mg | ORAL_TABLET | Freq: Three times a day (TID) | ORAL | 5 refills | Status: DC | PRN

## 2018-08-17 MED ORDER — SUCRALFATE 1 G PO TABS: ORAL_TABLET | ORAL | 11 refills | Status: DC

## 2018-08-17 NOTE — Patient Instructions (Signed)
Stop promethazine for nausea.

## 2018-08-17 NOTE — Progress Notes (Signed)
Patient: Joshua Guerrero Male    DOB: Feb 04, 1956   63 y.o.   MRN: [id number] Visit Date: 08/17/2018  Today's Provider: [name], MD   Chief Complaint  Patient presents with  . Back Pain  . Nausea  . update tetanus   Subjective:     HPI  Patient comes in today c/o pain in his back due to a fall he had about 3 days ago. He reports that he always has back pain, but the fall made his symptoms worse.  He also could not planes about mild worsening depression since decreasing his chronic fentanyl dose.  No suicidal or homicidal ideation.  Since decreasing fentanyl he is admittedly more alert and less foggy in his head.  He also reports that he has had increased nausea, and is requesting something for that. He reports that he experiences this daily. However, some days are worse than others.   He is also requesting to update his Tetanus vaccine. He reports that he cut his lower leg on a dog chain. His last Tdap  was in 2010.  Other than significant abrasions and superficial lacerations of both lower extremities he had no injuries from this fall.   Allergies  Allergen Reactions  . Acetaminophen Shortness Of Breath  . Codeine Itching and Nausea And Vomiting  . Opana [Oxymorphone] Swelling     Current Outpatient Medications:  .  Ascorbic Acid (VITAMIN C PO), Take by mouth. am, Disp: , Rfl:  .  B Complex Vitamins (VITAMIN B COMPLEX PO), Take by mouth. am, Disp: , Rfl:  .  baclofen (LIORESAL) 10 MG tablet, Take 5 mg by mouth 3 (three) times daily. STARTING AT LUNCHTIME, Disp: , Rfl:  .  desvenlafaxine (PRISTIQ) 100 MG 24 hr tablet, Take 1 tablet (100 mg total) by mouth daily., Disp: 30 tablet, Rfl: 5 .  famotidine (PEPCID) 40 MG tablet, Take 1 tablet (40 mg total) by mouth every evening., Disp: 30 tablet, Rfl: 1 .  fentaNYL (DURAGESIC - DOSED MCG/HR) 25 MCG/HR patch, , Disp: , Rfl:  .  fentaNYL (DURAGESIC - DOSED MCG/HR) 50 MCG/HR, Place 50 mcg onto the skin every 3  (three) days. , Disp: , Rfl:  .  lansoprazole (PREVACID) 30 MG capsule, Take 1 capsule (30 mg total) by mouth 2 (two) times daily before a meal., Disp: 180 capsule, Rfl: 3 .  metoprolol succinate (TOPROL-XL) 50 MG 24 hr tablet, Take 1 tablet (50 mg total) by mouth daily. Take with or immediately following a meal., Disp: 90 tablet, Rfl: 1 .  Multiple Vitamin (MULTIVITAMIN) capsule, Take 1 capsule by mouth daily., Disp: , Rfl:  .  omeprazole (PRILOSEC) 40 MG capsule, Take 1 capsule (40 mg total) by mouth daily., Disp: 30 capsule, Rfl: 5 .  oxyCODONE-acetaminophen (PERCOCET) 10-325 MG tablet, Take 1 tablet by mouth every 4 (four) hours. 3AM, 7AM, 11AM, 3 PM, 7PM AND 11PM, Disp: , Rfl:  .  pantoprazole (PROTONIX) 40 MG tablet, Take 1 tablet (40 mg total) by mouth daily., Disp: 30 tablet, Rfl: 6 .  pregabalin (LYRICA) 50 MG capsule, , Disp: , Rfl:  .  promethazine (PHENERGAN) 25 MG tablet, TAKE 1 TABLET BY MOUTH EVERY 8 HOURS AS NEEDED FOR NAUSEA OR VOMITING, Disp: 60 tablet, Rfl: 2 .  sucralfate (CARAFATE) 1 g tablet, TAKE 1 TABLET BY MOUTH 4 TIMES A DAY WITH MEALS AND AT BEDTIME., Disp: 120 tablet, Rfl: 3  Review of Systems  Constitutional: Positive for  fatigue. Negative for activity change, appetite change, chills, diaphoresis and unexpected weight change.  HENT: Negative.   Eyes: Negative.   Respiratory: Negative for shortness of breath.   Cardiovascular: Negative for chest pain, palpitations and leg swelling.  Gastrointestinal: Positive for nausea. Negative for abdominal pain, blood in stool, constipation, diarrhea, rectal pain and vomiting.  Endocrine: Negative.   Musculoskeletal: Positive for arthralgias, back pain and myalgias.  Skin: Positive for wound. Negative for color change, pallor and rash.  Allergic/Immunologic: Negative.   Neurological: Positive for headaches.  Psychiatric/Behavioral: Positive for dysphoric mood.    Social History   Tobacco Use  . Smoking status: Heavy  Tobacco Smoker    Packs/day: 1.00    Years: 20.00    Pack years: 20.00    Types: Cigarettes  . Smokeless tobacco: Never Used  . Tobacco comment: Had quit for 8 yrs. Father died 14-Jan-2023. has had several cigarettes last few days.  Substance Use Topics  . Alcohol use: No      Objective:   BP 136/78 (BP Location: Left Arm, Patient Position: Sitting, Cuff Size: Normal)   Pulse 80   Temp 98.8 F (37.1 C)   Ht 5\' 9"  (1.753 m)   Wt 195 lb (88.5 kg)   SpO2 97%   BMI 28.80 kg/m  Vitals:   08/17/18 1036  BP: 136/78  Pulse: 80  Temp: 98.8 F (37.1 C)  SpO2: 97%  Weight: 195 lb (88.5 kg)  Height: 5\' 9"  (1.753 m)     Physical Exam Vitals signs reviewed.  Constitutional:      Appearance: Normal appearance.  HENT:     Head: Normocephalic and atraumatic.     Right Ear: External ear normal.     Left Ear: External ear normal.     Nose: Nose normal.  Eyes:     Conjunctiva/sclera: Conjunctivae normal.  Cardiovascular:     Rate and Rhythm: Normal rate and regular rhythm.     Heart sounds: Normal heart sounds.  Pulmonary:     Effort: Pulmonary effort is normal.     Breath sounds: Normal breath sounds.  Skin:    Comments: Significant abrasions of both knees and lower extremities below the knees due to a fall the patient states he had recently.  Neurological:     Mental Status: He is alert. Mental status is at baseline.  Psychiatric:        Mood and Affect: Mood normal.        Behavior: Behavior normal.        Thought Content: Thought content normal.        Judgment: Judgment normal.         Assessment & Plan    1. Fall, initial encounter Patient has no significant injury from this other than abrasions of his legs.  Tetanus shot updated.  2. Heartburn  - sucralfate (CARAFATE) 1 g tablet; TAKE 1 TABLET BY MOUTH 4 TIMES A DAY WITH MEALS AND AT BEDTIME.  Dispense: 120 tablet; Refill: 11  3. Chronic nausea I think this chronic nausea might be from his reflux.  We will add  sucralfate and change Phenergan to ondansetron.  Return to clinic 1 month.  4. Chronic back pain, unspecified back location, unspecified back pain laterality I think the patient is doing better since he is cut his fentanyl in half per the pain clinic.  Stressed to him that I think this will help him going forward.   - Td : Tetanus/diphtheria >7yo Preservative  free  5. Essential hypertension 6.Leg Abrasion Tetanus shot given for this. 7.Mild Depression May need to add something else to this.  He asked about increasing Pristiq but he is at maximum dose of this.  Consider Wellbutrin or a low-dose of another SSRI.   I have done the exam and reviewed the above chart and it is accurate to the best of my knowledge. Development worker, community has been used in this note in any air is in the dictation or transcription are unintentional. I have done the exam and reviewed the chart and it is accurate to the best of my knowledge. Development worker, community has been used and  any errors in dictation or transcription are unintentional. Miguel Aschoff M.D. [place], MD  [facility]

## 2018-08-26 DIAGNOSIS — M5481 Occipital neuralgia: Secondary | ICD-10-CM | POA: Diagnosis not present

## 2018-08-26 DIAGNOSIS — M47817 Spondylosis without myelopathy or radiculopathy, lumbosacral region: Secondary | ICD-10-CM | POA: Diagnosis not present

## 2018-08-26 DIAGNOSIS — M961 Postlaminectomy syndrome, not elsewhere classified: Secondary | ICD-10-CM | POA: Diagnosis not present

## 2018-08-26 DIAGNOSIS — M47812 Spondylosis without myelopathy or radiculopathy, cervical region: Secondary | ICD-10-CM | POA: Diagnosis not present

## 2018-08-26 DIAGNOSIS — M791 Myalgia, unspecified site: Secondary | ICD-10-CM | POA: Diagnosis not present

## 2018-08-26 DIAGNOSIS — M62830 Muscle spasm of back: Secondary | ICD-10-CM | POA: Diagnosis not present

## 2018-08-26 DIAGNOSIS — K5900 Constipation, unspecified: Secondary | ICD-10-CM | POA: Diagnosis not present

## 2018-08-26 DIAGNOSIS — G894 Chronic pain syndrome: Secondary | ICD-10-CM | POA: Diagnosis not present

## 2018-08-26 DIAGNOSIS — Z79891 Long term (current) use of opiate analgesic: Secondary | ICD-10-CM | POA: Diagnosis not present

## 2018-08-26 DIAGNOSIS — M5416 Radiculopathy, lumbar region: Secondary | ICD-10-CM | POA: Diagnosis not present

## 2018-08-26 DIAGNOSIS — M1712 Unilateral primary osteoarthritis, left knee: Secondary | ICD-10-CM | POA: Diagnosis not present

## 2018-08-30 ENCOUNTER — Ambulatory Visit: Payer: Self-pay | Admitting: Family Medicine

## 2018-09-08 ENCOUNTER — Encounter: Payer: Self-pay | Admitting: Family Medicine

## 2018-09-08 ENCOUNTER — Ambulatory Visit (INDEPENDENT_AMBULATORY_CARE_PROVIDER_SITE_OTHER): Payer: PPO | Admitting: Family Medicine

## 2018-09-08 ENCOUNTER — Other Ambulatory Visit: Payer: Self-pay

## 2018-09-08 ENCOUNTER — Telehealth: Payer: Self-pay

## 2018-09-08 ENCOUNTER — Ambulatory Visit: Payer: PPO | Admitting: Family Medicine

## 2018-09-08 VITALS — BP 160/68 | HR 72 | Temp 98.1°F | Ht 69.0 in | Wt 203.0 lb

## 2018-09-08 DIAGNOSIS — R519 Headache, unspecified: Secondary | ICD-10-CM

## 2018-09-08 DIAGNOSIS — F339 Major depressive disorder, recurrent, unspecified: Secondary | ICD-10-CM

## 2018-09-08 DIAGNOSIS — M542 Cervicalgia: Secondary | ICD-10-CM | POA: Diagnosis not present

## 2018-09-08 DIAGNOSIS — G8929 Other chronic pain: Secondary | ICD-10-CM | POA: Diagnosis not present

## 2018-09-08 DIAGNOSIS — K219 Gastro-esophageal reflux disease without esophagitis: Secondary | ICD-10-CM | POA: Diagnosis not present

## 2018-09-08 DIAGNOSIS — R51 Headache: Secondary | ICD-10-CM | POA: Diagnosis not present

## 2018-09-08 DIAGNOSIS — G4733 Obstructive sleep apnea (adult) (pediatric): Secondary | ICD-10-CM | POA: Diagnosis not present

## 2018-09-08 DIAGNOSIS — F324 Major depressive disorder, single episode, in partial remission: Secondary | ICD-10-CM

## 2018-09-08 DIAGNOSIS — M549 Dorsalgia, unspecified: Secondary | ICD-10-CM

## 2018-09-08 MED ORDER — LANSOPRAZOLE 30 MG PO CPDR: 30.0000 mg | DELAYED_RELEASE_CAPSULE | Freq: Two times a day (BID) | ORAL | 3 refills | Status: DC

## 2018-09-08 MED ORDER — KETOROLAC TROMETHAMINE 60 MG/2ML IM SOLN
60.0000 mg | Freq: Once | INTRAMUSCULAR | Status: AC
  Administered 2018-09-08: 60 mg via INTRAMUSCULAR

## 2018-09-08 MED ORDER — BUPROPION HCL 75 MG PO TABS
75.0000 mg | ORAL_TABLET | Freq: Every day | ORAL | 1 refills | Status: DC
Start: 2018-09-08 — End: 2019-03-22

## 2018-09-08 NOTE — Progress Notes (Signed)
Patient: Joshua Guerrero Male    DOB: 12/22/1955   63 y.o.   MRN: [id number] Visit Date: 09/08/2018  Today's Provider: [name], MD   Chief Complaint  Patient presents with  . Depression    4-5 months  . Nausea    4-5 months now   Subjective:     Depression       The patient presents with depression.  This is a recurrent problem.  The current episode started more than 1 year ago.   The onset quality is gradual.   The problem occurs daily.  The problem has been gradually worsening since onset.  Associated symptoms include fatigue, hopelessness (borderline), insomnia, irritable, decreased interest (some), body aches, myalgias, headaches and indigestion.     The symptoms are aggravated by nothing.  Past treatments include other medications.  Compliance with treatment is variable.  Past compliance problems include medical issues.  Previous treatment provided moderate relief.  Risk factors include family history of mental illness, prior traumatic experience, stress and suicide in immediate family.   Past medical history includes chronic pain, fibromyalgia, physical disability, brain trauma (2015), anxiety, depression and head trauma.     His headaches neck pain and back pain continue.  He states that since his fentanyl was decreased he is more clear headed but is having more pain.  I told him to decreased dose is better. He states depression and anxiety are worse,not suiicidal. Allergies  Allergen Reactions  . Acetaminophen Shortness Of Breath  . Codeine Itching and Nausea And Vomiting  . Opana [Oxymorphone] Swelling     Current Outpatient Medications:  .  Ascorbic Acid (VITAMIN C PO), Take by mouth. am, Disp: , Rfl:  .  B Complex Vitamins (VITAMIN B COMPLEX PO), Take by mouth. am, Disp: , Rfl:  .  baclofen (LIORESAL) 10 MG tablet, Take 5 mg by mouth 3 (three) times daily. STARTING AT LUNCHTIME, Disp: , Rfl:  .  desvenlafaxine (PRISTIQ) 100 MG 24 hr tablet, Take 1 tablet  (100 mg total) by mouth daily., Disp: 30 tablet, Rfl: 5 .  famotidine (PEPCID) 40 MG tablet, Take 1 tablet (40 mg total) by mouth every evening., Disp: 30 tablet, Rfl: 1 .  fentaNYL (DURAGESIC - DOSED MCG/HR) 25 MCG/HR patch, , Disp: , Rfl:  .  lansoprazole (PREVACID) 30 MG capsule, Take 1 capsule (30 mg total) by mouth 2 (two) times daily before a meal., Disp: 180 capsule, Rfl: 3 .  metoprolol succinate (TOPROL-XL) 50 MG 24 hr tablet, Take 1 tablet (50 mg total) by mouth daily. Take with or immediately following a meal., Disp: 90 tablet, Rfl: 1 .  Multiple Vitamin (MULTIVITAMIN) capsule, Take 1 capsule by mouth daily., Disp: , Rfl:  .  omeprazole (PRILOSEC) 40 MG capsule, Take 1 capsule (40 mg total) by mouth daily., Disp: 30 capsule, Rfl: 5 .  ondansetron (ZOFRAN-ODT) 4 MG disintegrating tablet, Take 1 tablet (4 mg total) by mouth every 8 (eight) hours as needed for nausea or vomiting., Disp: 60 tablet, Rfl: 5 .  oxyCODONE-acetaminophen (PERCOCET) 10-325 MG tablet, Take 1 tablet by mouth every 4 (four) hours. 3AM, 7AM, 11AM, 3 PM, 7PM AND 11PM, Disp: , Rfl:  .  promethazine (PHENERGAN) 25 MG tablet, TAKE 1 TABLET BY MOUTH EVERY 8 HOURS AS NEEDED FOR NAUSEA OR VOMITING, Disp: 60 tablet, Rfl: 2 .  sucralfate (CARAFATE) 1 g tablet, TAKE 1 TABLET BY MOUTH 4 TIMES A DAY WITH MEALS AND AT  BEDTIME., Disp: 120 tablet, Rfl: 11 .  buPROPion (WELLBUTRIN) 75 MG tablet, Take 1 tablet (75 mg total) by mouth daily with breakfast., Disp: 90 tablet, Rfl: 1 .  fentaNYL (DURAGESIC - DOSED MCG/HR) 50 MCG/HR, Place 50 mcg onto the skin every 3 (three) days. , Disp: , Rfl:  .  pantoprazole (PROTONIX) 40 MG tablet, Take 1 tablet (40 mg total) by mouth daily. (Patient not taking: Reported on 09/08/2018), Disp: 30 tablet, Rfl: 6 .  pregabalin (LYRICA) 50 MG capsule, , Disp: , Rfl:   Review of Systems  Constitutional: Positive for fatigue.  HENT: Negative.   Eyes: Negative.   Cardiovascular: Negative.   Gastrointestinal:  Positive for nausea.  Endocrine: Negative.   Musculoskeletal: Positive for myalgias.  Allergic/Immunologic: Negative.   Neurological: Positive for headaches.  Hematological: Negative.   Psychiatric/Behavioral: Positive for depression and dysphoric mood. The patient has insomnia.   All other systems reviewed and are negative.   Social History   Tobacco Use  . Smoking status: Heavy Tobacco Smoker    Packs/day: 1.00    Years: 20.00    Pack years: 20.00    Types: Cigarettes  . Smokeless tobacco: Never Used  . Tobacco comment: Had quit for 8 yrs. Father died 2023-01-13. has had several cigarettes last few days.  Substance Use Topics  . Alcohol use: No      Objective:   BP (!) 160/68 (BP Location: Right Arm, Patient Position: Sitting, Cuff Size: Normal)   Pulse 72   Temp 98.1 F (36.7 C) (Oral)   Ht 5\' 9"  (1.753 m)   Wt 203 lb (92.1 kg)   SpO2 94%   BMI 29.98 kg/m  Vitals:   09/08/18 1559  BP: (!) 160/68  Pulse: 72  Temp: 98.1 F (36.7 C)  TempSrc: Oral  SpO2: 94%  Weight: 203 lb (92.1 kg)  Height: 5\' 9"  (1.753 m)     Physical Exam Vitals signs reviewed.  Constitutional:      General: He is irritable.     Appearance: Normal appearance.  HENT:     Head: Normocephalic and atraumatic.     Right Ear: External ear normal.     Left Ear: External ear normal.  Eyes:     General: No scleral icterus. Cardiovascular:     Rate and Rhythm: Normal rate and regular rhythm.     Heart sounds: Normal heart sounds.  Pulmonary:     Breath sounds: Normal breath sounds.  Abdominal:     Palpations: Abdomen is soft.     Tenderness: There is no abdominal tenderness.  Skin:    General: Skin is warm and dry.  Neurological:     Mental Status: He is alert and oriented to person, place, and time.  Psychiatric:        Mood and Affect: Mood normal.        Behavior: Behavior normal.        Thought Content: Thought content normal.        Judgment: Judgment normal.          Assessment & Plan    1. Depression, major, single episode, in partial remission ([place]) Add Burppion 75 daily. RTC 1 month. 2. Episode of recurrent major depressive disorder, unspecified depression episode severity ([place])  - buPROPion (WELLBUTRIN) 75 MG tablet; Take 1 tablet (75 mg total) by mouth daily with breakfast.  Dispense: 90 tablet; Refill: 1  3. Nonintractable headache, unspecified chronicity pattern, unspecified headache type  - ketorolac (TORADOL) injection  60 mg  4. Chronic neck and back pain  - ketorolac (TORADOL) injection 60 mg  5. Obstructive sleep apnea syndrome   6. Gastroesophageal reflux disease without esophagitis Refill Prevacid.  7. Chronic back pain, unspecified back location, unspecified back pain laterality Per pain clinic.     Majd Tissue Cranford Mon, MD  [facility]

## 2018-09-08 NOTE — Telephone Encounter (Signed)
Izora Gala pharmacist from [facility] calling that patient was prescribed today Wellbutrin 75 mg and patient is on Pristiq 100 mg and states this two medications cannot be taken together. CB#[phone]

## 2018-09-09 NOTE — Telephone Encounter (Signed)
Please review. Thanks!  

## 2018-09-14 ENCOUNTER — Other Ambulatory Visit: Payer: Self-pay | Admitting: Family Medicine

## 2018-09-14 MED ORDER — METOPROLOL SUCCINATE ER 50 MG PO TB24: 50.0000 mg | ORAL_TABLET | Freq: Every day | ORAL | 1 refills | Status: DC

## 2018-09-14 NOTE — Telephone Encounter (Signed)
[facility] faxed refill request for the following medications:  metoprolol succinate (TOPROL-XL) 50 MG 24 hr tablet    Please advise.  

## 2018-09-14 NOTE — Telephone Encounter (Signed)
Medication send to pharmacy. 

## 2018-09-23 DIAGNOSIS — M47817 Spondylosis without myelopathy or radiculopathy, lumbosacral region: Secondary | ICD-10-CM | POA: Diagnosis not present

## 2018-09-23 DIAGNOSIS — G894 Chronic pain syndrome: Secondary | ICD-10-CM | POA: Diagnosis not present

## 2018-09-23 DIAGNOSIS — M961 Postlaminectomy syndrome, not elsewhere classified: Secondary | ICD-10-CM | POA: Diagnosis not present

## 2018-09-23 DIAGNOSIS — M47812 Spondylosis without myelopathy or radiculopathy, cervical region: Secondary | ICD-10-CM | POA: Diagnosis not present

## 2018-10-04 ENCOUNTER — Other Ambulatory Visit: Payer: Self-pay

## 2018-10-04 DIAGNOSIS — R110 Nausea: Secondary | ICD-10-CM

## 2018-10-10 ENCOUNTER — Ambulatory Visit: Payer: Self-pay | Admitting: Family Medicine

## 2018-10-10 ENCOUNTER — Encounter: Payer: Self-pay | Admitting: *Deleted

## 2018-10-10 ENCOUNTER — Telehealth: Payer: Self-pay | Admitting: Gastroenterology

## 2018-10-10 ENCOUNTER — Other Ambulatory Visit: Payer: Self-pay

## 2018-10-10 NOTE — Telephone Encounter (Signed)
Patient called back & wants to keep his  procedure scheduled for Friday. He called this am & cancelled in error. He is scheduled to have his covid testing in the morning.

## 2018-10-10 NOTE — Telephone Encounter (Signed)
Pt left vm he wants to cancel his procedure for Friday he would like a call please

## 2018-10-11 ENCOUNTER — Other Ambulatory Visit: Payer: Self-pay

## 2018-10-11 ENCOUNTER — Ambulatory Visit (INDEPENDENT_AMBULATORY_CARE_PROVIDER_SITE_OTHER): Payer: PPO | Admitting: Family Medicine

## 2018-10-11 ENCOUNTER — Encounter: Payer: Self-pay | Admitting: Family Medicine

## 2018-10-11 ENCOUNTER — Other Ambulatory Visit
Admission: RE | Admit: 2018-10-11 | Discharge: 2018-10-11 | Disposition: A | Discharge status: Home / Self Care | Payer: PPO | Source: Ambulatory Visit | Attending: Gastroenterology | Admitting: Gastroenterology

## 2018-10-11 VITALS — BP 122/76 | HR 69 | Temp 98.7°F | Resp 18 | Wt 204.0 lb

## 2018-10-11 DIAGNOSIS — Z1159 Encounter for screening for other viral diseases: Secondary | ICD-10-CM | POA: Insufficient documentation

## 2018-10-11 DIAGNOSIS — Z01812 Encounter for preprocedural laboratory examination: Secondary | ICD-10-CM | POA: Insufficient documentation

## 2018-10-11 DIAGNOSIS — M542 Cervicalgia: Secondary | ICD-10-CM

## 2018-10-11 DIAGNOSIS — F324 Major depressive disorder, single episode, in partial remission: Secondary | ICD-10-CM | POA: Diagnosis not present

## 2018-10-11 DIAGNOSIS — G8929 Other chronic pain: Secondary | ICD-10-CM

## 2018-10-11 DIAGNOSIS — M549 Dorsalgia, unspecified: Secondary | ICD-10-CM

## 2018-10-11 DIAGNOSIS — R110 Nausea: Secondary | ICD-10-CM | POA: Diagnosis not present

## 2018-10-11 MED ORDER — PROCHLORPERAZINE MALEATE 10 MG PO TABS: 10.0000 mg | ORAL_TABLET | Freq: Three times a day (TID) | ORAL | 0 refills | Status: DC | PRN

## 2018-10-11 MED ORDER — KETOROLAC TROMETHAMINE 60 MG/2ML IM SOLN
60.0000 mg | Freq: Once | INTRAMUSCULAR | Status: AC
  Administered 2018-10-11: 60 mg via INTRAMUSCULAR

## 2018-10-11 NOTE — Progress Notes (Signed)
Patient: Joshua Guerrero Male    DOB: 12/31/1955   63 y.o.   MRN: [id number] Visit Date: 10/11/2018  Today's Provider: [name], MD   No chief complaint on file.  Subjective:     HPI 1 Month follow up for depression. Patient states that the medication did not work well. He started back on the Pristiq after two weeks of trying the Wellbutrin. Patient states that his nausea has been worse. He had a friend give him some Compazine 10 mg to try and it worked about an hour. He would like a prescription for this medication. Patient would like a Toradol injection. He is scheduled for EGD tomorrow.  Allergies  Allergen Reactions   Acetaminophen Shortness Of Breath   Codeine Itching and Nausea And Vomiting   Opana [Oxymorphone] Swelling     Current Outpatient Medications:    baclofen (LIORESAL) 10 MG tablet, Take 5 mg by mouth 3 (three) times daily. STARTING AT LUNCHTIME, Disp: , Rfl:    desvenlafaxine (PRISTIQ) 100 MG 24 hr tablet, Take 1 tablet (100 mg total) by mouth daily., Disp: 30 tablet, Rfl: 5   fentaNYL (DURAGESIC - DOSED MCG/HR) 25 MCG/HR patch, , Disp: , Rfl:    lansoprazole (PREVACID) 30 MG capsule, Take 1 capsule (30 mg total) by mouth 2 (two) times daily before a meal., Disp: 180 capsule, Rfl: 3   metoprolol succinate (TOPROL-XL) 50 MG 24 hr tablet, Take 1 tablet (50 mg total) by mouth daily. Take with or immediately following a meal., Disp: 90 tablet, Rfl: 1   Multiple Vitamin (MULTIVITAMIN) capsule, Take 1 capsule by mouth daily., Disp: , Rfl:    omeprazole (PRILOSEC) 40 MG capsule, Take 1 capsule (40 mg total) by mouth daily., Disp: 30 capsule, Rfl: 5   ondansetron (ZOFRAN-ODT) 4 MG disintegrating tablet, Take 1 tablet (4 mg total) by mouth every 8 (eight) hours as needed for nausea or vomiting., Disp: 60 tablet, Rfl: 5   oxyCODONE-acetaminophen (PERCOCET) 10-325 MG tablet, Take 1 tablet by mouth every 4 (four) hours. 3AM, 7AM, 11AM, 3 PM, 7PM AND  11PM, Disp: , Rfl:    promethazine (PHENERGAN) 25 MG tablet, TAKE 1 TABLET BY MOUTH EVERY 8 HOURS AS NEEDED FOR NAUSEA OR VOMITING, Disp: 60 tablet, Rfl: 2   sucralfate (CARAFATE) 1 g tablet, TAKE 1 TABLET BY MOUTH 4 TIMES A DAY WITH MEALS AND AT BEDTIME., Disp: 120 tablet, Rfl: 11   Ascorbic Acid (VITAMIN C PO), Take by mouth. am, Disp: , Rfl:    B Complex Vitamins (VITAMIN B COMPLEX PO), Take by mouth. am, Disp: , Rfl:    buPROPion (WELLBUTRIN) 75 MG tablet, Take 1 tablet (75 mg total) by mouth daily with breakfast. (Patient not taking: Reported on 10/10/2018), Disp: 90 tablet, Rfl: 1   famotidine (PEPCID) 40 MG tablet, Take 1 tablet (40 mg total) by mouth every evening., Disp: 30 tablet, Rfl: 1   fentaNYL (DURAGESIC - DOSED MCG/HR) 50 MCG/HR, Place 50 mcg onto the skin every 3 (three) days. , Disp: , Rfl:    pantoprazole (PROTONIX) 40 MG tablet, Take 1 tablet (40 mg total) by mouth daily. (Patient not taking: Reported on 09/08/2018), Disp: 30 tablet, Rfl: 6   pregabalin (LYRICA) 50 MG capsule, , Disp: , Rfl:   Review of Systems  Constitutional: Positive for fatigue.  HENT: Negative.   Eyes: Negative.   Cardiovascular: Negative.   Gastrointestinal: Positive for nausea.  Endocrine: Negative.   Musculoskeletal: Positive  for myalgias.  Allergic/Immunologic: Negative.   Neurological: Positive for headaches.  Hematological: Negative.   Psychiatric/Behavioral: Positive for dysphoric mood.  All other systems reviewed and are negative.   Social History   Tobacco Use   Smoking status: Heavy Tobacco Smoker    Packs/day: 1.00    Years: 20.00    Pack years: 20.00    Types: Cigarettes   Smokeless tobacco: Never Used   Tobacco comment: currently about 1/2 PPD  Substance Use Topics   Alcohol use: No      Objective:   BP 122/76 (BP Location: Right Arm, Patient Position: Sitting, Cuff Size: Normal)    Pulse 69    Temp 98.7 F (37.1 C) (Oral)    Resp 18    Wt 204 lb (92.5 kg)     SpO2 97%    BMI 30.13 kg/m  Vitals:   10/11/18 1604  BP: 122/76  Pulse: 69  Resp: 18  Temp: 98.7 F (37.1 C)  TempSrc: Oral  SpO2: 97%  Weight: 204 lb (92.5 kg)     Physical Exam Vitals signs reviewed.  Constitutional:      Appearance: He is well-developed.  HENT:     Head: Normocephalic and atraumatic.     Right Ear: External ear normal.     Left Ear: External ear normal.     Nose: Nose normal.  Eyes:     General: No scleral icterus.    Conjunctiva/sclera: Conjunctivae normal.  Neck:     Thyroid: No thyromegaly.  Cardiovascular:     Rate and Rhythm: Normal rate and regular rhythm.     Heart sounds: Normal heart sounds.  Pulmonary:     Effort: Pulmonary effort is normal.     Breath sounds: Normal breath sounds.  Abdominal:     General: There is no distension.     Palpations: Abdomen is soft.     Tenderness: There is no abdominal tenderness.  Lymphadenopathy:     Cervical: No cervical adenopathy.  Skin:    General: Skin is dry.  Neurological:     Mental Status: He is alert and oriented to person, place, and time.  Psychiatric:        Behavior: Behavior normal.        Thought Content: Thought content normal.        Judgment: Judgment normal.         Assessment & Plan    1. Depression, major, single episode, in partial remission ([place]) Continue meds.  2. Chronic neck and back pain Per Pain clinic. - prochlorperazine (COMPAZINE) 10 MG tablet; Take 1 tablet (10 mg total) by mouth every 8 (eight) hours as needed for nausea or vomiting.  Dispense: 90 tablet; Refill: 0 - ketorolac (TORADOL) injection 60 mg  3. Chronic nausea Per GI.    I have done the exam and reviewed the above chart and it is accurate to the best of my knowledge. Development worker, community has been used in this note in any air is in the dictation or transcription are unintentional.  [name], MD  [place]

## 2018-10-12 ENCOUNTER — Telehealth: Payer: Self-pay

## 2018-10-12 LAB — NOVEL CORONAVIRUS, NAA (HOSP ORDER, SEND-OUT TO REF LAB; TAT 18-24 HRS): SARS-CoV-2, NAA: NOT DETECTED

## 2018-10-12 NOTE — Telephone Encounter (Signed)
He was calling back to let you know his Hydrochlorothiazide dosage is 25 mg once a day.

## 2018-10-13 NOTE — Discharge Instructions (Signed)
General Anesthesia, Adult, Care After  This sheet gives you information about how to care for yourself after your procedure. Your health care provider may also give you more specific instructions. If you have problems or questions, contact your health care provider.  What can I expect after the procedure?  After the procedure, the following side effects are common:  Pain or discomfort at the IV site.  Nausea.  Vomiting.  Sore throat.  Trouble concentrating.  Feeling cold or chills.  Weak or tired.  Sleepiness and fatigue.  Soreness and body aches. These side effects can affect parts of the body that were not involved in surgery.  Follow these instructions at home:    For at least 24 hours after the procedure:  Have a responsible adult stay with you. It is important to have someone help care for you until you are awake and alert.  Rest as needed.  Do not:  Participate in activities in which you could fall or become injured.  Drive.  Use heavy machinery.  Drink alcohol.  Take sleeping pills or medicines that cause drowsiness.  Make important decisions or sign legal documents.  Take care of children on your own.  Eating and drinking  Follow any instructions from your health care provider about eating or drinking restrictions.  When you feel hungry, start by eating small amounts of foods that are soft and easy to digest (bland), such as toast. Gradually return to your regular diet.  Drink enough fluid to keep your urine pale yellow.  If you vomit, rehydrate by drinking water, juice, or clear broth.  General instructions  If you have sleep apnea, surgery and certain medicines can increase your risk for breathing problems. Follow instructions from your health care provider about wearing your sleep device:  Anytime you are sleeping, including during daytime naps.  While taking prescription pain medicines, sleeping medicines, or medicines that make you drowsy.  Return to your normal activities as told by your health care  provider. Ask your health care provider what activities are safe for you.  Take over-the-counter and prescription medicines only as told by your health care provider.  If you smoke, do not smoke without supervision.  Keep all follow-up visits as told by your health care provider. This is important.  Contact a health care provider if:  You have nausea or vomiting that does not get better with medicine.  You cannot eat or drink without vomiting.  You have pain that does not get better with medicine.  You are unable to pass urine.  You develop a skin rash.  You have a fever.  You have redness around your IV site that gets worse.  Get help right away if:  You have difficulty breathing.  You have chest pain.  You have blood in your urine or stool, or you vomit blood.  Summary  After the procedure, it is common to have a sore throat or nausea. It is also common to feel tired.  Have a responsible adult stay with you for the first 24 hours after general anesthesia. It is important to have someone help care for you until you are awake and alert.  When you feel hungry, start by eating small amounts of foods that are soft and easy to digest (bland), such as toast. Gradually return to your regular diet.  Drink enough fluid to keep your urine pale yellow.  Return to your normal activities as told by your health care provider. Ask your health care   provider what activities are safe for you.  This information is not intended to replace advice given to you by your health care provider. Make sure you discuss any questions you have with your health care provider.  Document Released: 07/27/2000 Document Revised: 12/04/2016 Document Reviewed: 12/04/2016  Elsevier Interactive Patient Education  2019 [employer]vier Inc[employer]

## 2018-10-14 ENCOUNTER — Encounter
Admission: RE | Disposition: A | Discharge status: Home / Self Care | Payer: Self-pay | Source: Home / Self Care | Attending: Gastroenterology

## 2018-10-14 ENCOUNTER — Ambulatory Visit: Payer: PPO | Admitting: Anesthesiology

## 2018-10-14 ENCOUNTER — Ambulatory Visit
Admission: RE | Admit: 2018-10-14 | Discharge: 2018-10-14 | Disposition: A | Discharge status: Home / Self Care | Payer: PPO | Attending: Gastroenterology | Admitting: Gastroenterology

## 2018-10-14 ENCOUNTER — Other Ambulatory Visit: Payer: Self-pay

## 2018-10-14 DIAGNOSIS — K259 Gastric ulcer, unspecified as acute or chronic, without hemorrhage or perforation: Secondary | ICD-10-CM | POA: Diagnosis not present

## 2018-10-14 DIAGNOSIS — I10 Essential (primary) hypertension: Secondary | ICD-10-CM | POA: Insufficient documentation

## 2018-10-14 DIAGNOSIS — K222 Esophageal obstruction: Secondary | ICD-10-CM | POA: Diagnosis not present

## 2018-10-14 DIAGNOSIS — G709 Myoneural disorder, unspecified: Secondary | ICD-10-CM | POA: Insufficient documentation

## 2018-10-14 DIAGNOSIS — K219 Gastro-esophageal reflux disease without esophagitis: Secondary | ICD-10-CM | POA: Insufficient documentation

## 2018-10-14 DIAGNOSIS — Z79899 Other long term (current) drug therapy: Secondary | ICD-10-CM | POA: Insufficient documentation

## 2018-10-14 DIAGNOSIS — F17210 Nicotine dependence, cigarettes, uncomplicated: Secondary | ICD-10-CM | POA: Diagnosis not present

## 2018-10-14 DIAGNOSIS — Z79891 Long term (current) use of opiate analgesic: Secondary | ICD-10-CM | POA: Insufficient documentation

## 2018-10-14 DIAGNOSIS — K253 Acute gastric ulcer without hemorrhage or perforation: Secondary | ICD-10-CM | POA: Diagnosis not present

## 2018-10-14 DIAGNOSIS — R110 Nausea: Secondary | ICD-10-CM | POA: Diagnosis not present

## 2018-10-14 DIAGNOSIS — F329 Major depressive disorder, single episode, unspecified: Secondary | ICD-10-CM | POA: Diagnosis not present

## 2018-10-14 DIAGNOSIS — K2970 Gastritis, unspecified, without bleeding: Secondary | ICD-10-CM | POA: Diagnosis not present

## 2018-10-14 DIAGNOSIS — G4730 Sleep apnea, unspecified: Secondary | ICD-10-CM | POA: Insufficient documentation

## 2018-10-14 DIAGNOSIS — K2930 Chronic superficial gastritis without bleeding: Secondary | ICD-10-CM | POA: Diagnosis not present

## 2018-10-14 HISTORY — PX: ESOPHAGOGASTRODUODENOSCOPY (EGD) WITH PROPOFOL: SHX5813

## 2018-10-14 HISTORY — PX: ESOPHAGEAL DILATION: SHX303

## 2018-10-14 SURGERY — ESOPHAGOGASTRODUODENOSCOPY (EGD) WITH PROPOFOL
Anesthesia: General | Site: Esophagus

## 2018-10-14 MED ORDER — LIDOCAINE HCL (CARDIAC) PF 100 MG/5ML IV SOSY
PREFILLED_SYRINGE | INTRAVENOUS | Status: DC | PRN
  Administered 2018-10-14: 30 mg via INTRAVENOUS

## 2018-10-14 MED ORDER — SODIUM CHLORIDE 0.9 % IV SOLN: INTRAVENOUS | Status: DC

## 2018-10-14 MED ORDER — GLYCOPYRROLATE 0.2 MG/ML IJ SOLN
INTRAMUSCULAR | Status: DC | PRN
  Administered 2018-10-14: 0.1 mg via INTRAVENOUS

## 2018-10-14 MED ORDER — PROPOFOL 10 MG/ML IV BOLUS
INTRAVENOUS | Status: DC | PRN
  Administered 2018-10-14: 50 mg via INTRAVENOUS
  Administered 2018-10-14: 150 mg via INTRAVENOUS
  Administered 2018-10-14: 50 mg via INTRAVENOUS

## 2018-10-14 MED ORDER — LACTATED RINGERS IV SOLN
10.0000 mL/h | INTRAVENOUS | Status: DC
  Administered 2018-10-14: 10 mL/h via INTRAVENOUS

## 2018-10-14 MED ORDER — ONDANSETRON HCL 4 MG/2ML IJ SOLN: 4.0000 mg | Freq: Once | INTRAMUSCULAR | Status: DC | PRN

## 2018-10-14 SURGICAL SUPPLY — 10 items
BALLN DILATOR 15-18 8 (BALLOONS) ×2
BALLOON DILATOR 15-18 8 (BALLOONS) ×1 IMPLANT
BLOCK BITE 60FR ADLT L/F GRN (MISCELLANEOUS) ×2 IMPLANT
CANISTER SUCT 1200ML W/VALVE (MISCELLANEOUS) ×2 IMPLANT
FORCEPS BIOP RAD 4 LRG CAP 4 (CUTTING FORCEPS) ×2 IMPLANT
GOWN CVR UNV OPN BCK APRN NK (MISCELLANEOUS) ×2 IMPLANT
GOWN ISOL THUMB LOOP REG UNIV (MISCELLANEOUS) ×2
KIT ENDO PROCEDURE OLY (KITS) ×2 IMPLANT
SYR INFLATION 60ML (SYRINGE) ×2 IMPLANT
WATER STERILE IRR 250ML POUR (IV SOLUTION) ×2 IMPLANT

## 2018-10-14 NOTE — H&P (Signed)
Lucilla Lame, MD [address] [place], [place] [address] [phone] Fax : [phone]  Primary Care Physician:  Jerrol Banana., MD Primary Gastroenterologist:  Dr. Allen Norris  Pre-Procedure History & Physical: HPI:  Joshua Guerrero is a 63 y.o. male is here for an endoscopy.   Past Medical History:  Diagnosis Date  . Arthritis    lower back  . Brain bleed ([place]) 10/2016   AT [facility]-TREATED MEDICALLY NO SURGERY  . Confusion caused by a drug ([place])    fentanyl and oxycodone  . GERD (gastroesophageal reflux disease)   . Headache    past hx, secondary to nerve damage from accident  . Hypertension    CONTROLLED ON MEDS  . Neck pain, chronic   . Neuromuscular disorder ([place])    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  . Sleep apnea    Not currently using a C-Pap    Past Surgical History:  Procedure Laterality Date  . COLONOSCOPY WITH PROPOFOL N/A 02/11/2015   Procedure: COLONOSCOPY WITH PROPOFOL;  Surgeon: Lucilla Lame, MD;  Location: [facility];  Service: Endoscopy;  Laterality: N/A;  USES C-PAP  . NASAL SINUS SURGERY     due to severe sleep apnea  . NECK SURGERY     diffuse disc-and put in possibly screws patient thinks  . SHOULDER SURGERY Bilateral    x 2  . UMBILICAL HERNIA REPAIR N/A 02/26/2017   Primary repair.  Surgeon: Robert Bellow, MD;  Location: [facility];  Service: General;  Laterality: N/A;    Prior to Admission medications   Medication Sig Start Date End Date Taking? Authorizing Provider  Ascorbic Acid (VITAMIN C PO) Take by mouth. am   Yes [provider]  B Complex Vitamins (VITAMIN B COMPLEX PO) Take by mouth. am   Yes [provider]  baclofen (LIORESAL) 10 MG tablet Take 5 mg by mouth 3 (three) times daily. STARTING AT LUNCHTIME 05/22/16  Yes [provider]  desvenlafaxine (PRISTIQ) 100 MG 24 hr tablet Take 1 tablet (100 mg total) by mouth daily. 03/07/18  Yes Jerrol Banana.,  MD  fentaNYL (DURAGESIC - DOSED MCG/HR) 25 MCG/HR patch  01/25/18  Yes [provider]  lansoprazole (PREVACID) 30 MG capsule Take 1 capsule (30 mg total) by mouth 2 (two) times daily before a meal. 09/08/18  Yes Jerrol Banana., MD  metoprolol succinate (TOPROL-XL) 50 MG 24 hr tablet Take 1 tablet (50 mg total) by mouth daily. Take with or immediately following a meal. 09/14/18  Yes Jerrol Banana., MD  Multiple Vitamin (MULTIVITAMIN) capsule Take 1 capsule by mouth daily.   Yes [provider]  omeprazole (PRILOSEC) 40 MG capsule Take 1 capsule (40 mg total) by mouth daily. 05/16/18  Yes Jerrol Banana., MD  ondansetron (ZOFRAN-ODT) 4 MG disintegrating tablet Take 1 tablet (4 mg total) by mouth every 8 (eight) hours as needed for nausea or vomiting. 08/17/18  Yes Jerrol Banana., MD  oxyCODONE-acetaminophen (PERCOCET) 10-325 MG tablet Take 1 tablet by mouth every 4 (four) hours. 3AM, 7AM, 11AM, 3 PM, 7PM AND 11PM   Yes [provider]  prochlorperazine (COMPAZINE) 10 MG tablet Take 1 tablet (10 mg total) by mouth every 8 (eight) hours as needed for nausea or vomiting. 10/11/18  Yes Jerrol Banana., MD  promethazine (PHENERGAN) 25 MG tablet TAKE 1 TABLET BY MOUTH EVERY 8 HOURS AS NEEDED FOR  NAUSEA OR VOMITING 07/22/18  Yes Lucilla Lame, MD  sucralfate (CARAFATE) 1 g tablet TAKE 1 TABLET BY MOUTH 4 TIMES A DAY WITH MEALS AND AT BEDTIME. 08/17/18  Yes Jerrol Banana., MD  buPROPion [facility]) 75 MG tablet Take 1 tablet (75 mg total) by mouth daily with breakfast. Patient not taking: Reported on 10/10/2018 09/08/18   Jerrol Banana., MD  famotidine (PEPCID) 40 MG tablet Take 1 tablet (40 mg total) by mouth every evening. Patient not taking: Reported on 10/14/2018 09/10/17 10/10/18  Nance Pear, MD  fentaNYL (DURAGESIC - DOSED MCG/HR) 50 MCG/HR Place 50 mcg onto the skin every 3 (three) days.  12/15/11   [provider]   pantoprazole (PROTONIX) 40 MG tablet Take 1 tablet (40 mg total) by mouth daily. Patient not taking: Reported on 09/08/2018 06/28/18   Lucilla Lame, MD  pregabalin (LYRICA) 50 MG capsule  02/01/18   [provider]    Allergies as of 10/04/2018 - Review Complete 09/08/2018  Allergen Reaction Noted  . Acetaminophen Shortness Of Breath 11/07/2015  . Codeine Itching and Nausea And Vomiting 09/22/2014  . Opana [oxymorphone] Swelling 12/24/2014    Family History  Problem Relation Age of Onset  . Dementia Father   . Cancer Mother   . Cancer Sister   . Cancer Maternal Aunt     Social History   Socioeconomic History  . Marital status: Divorced    Spouse name: single  . Number of children: 0  . Years of education: 72  . Highest education level: Not on file  Occupational History  . Occupation: disability  Social Needs  . Financial resource strain: Not on file  . Food insecurity    Worry: Not on file    Inability: Not on file  . Transportation needs    Medical: Not on file    Non-medical: Not on file  Tobacco Use  . Smoking status: Heavy Tobacco Smoker    Packs/day: 1.00    Years: 20.00    Pack years: 20.00    Types: Cigarettes  . Smokeless tobacco: Never Used  . Tobacco comment: currently about 1/2 PPD  Substance and Sexual Activity  . Alcohol use: No  . Drug use: No  . Sexual activity: Never  Lifestyle  . Physical activity    Days per week: Not on file    Minutes per session: Not on file  . Stress: Not on file  Relationships  . Social Herbalist on phone: Not on file    Gets together: Not on file    Attends religious service: Not on file    Active member of club or organization: Not on file    Attends meetings of clubs or organizations: Not on file    Relationship status: Not on file  . Intimate partner violence    Fear of current or ex partner: Not on file    Emotionally abused: Not on file    Physically abused: Not on file    Forced sexual  activity: Not on file  Other Topics Concern  . Not on file  Social History Narrative  . Not on file    Review of Systems: See HPI, otherwise negative ROS  Physical Exam: BP (!) 156/76   Pulse 66   Temp 97.7 F (36.5 C) (Temporal)   Resp 17   Ht 5\' 9"  (1.753 m)   Wt 91.7 kg   SpO2 100%   BMI 29.86 kg/m  General:   Alert,  pleasant and cooperative in NAD Head:  Normocephalic and atraumatic. Neck:  Supple; no masses or thyromegaly. Lungs:  Clear throughout to auscultation.    Heart:  Regular rate and rhythm. Abdomen:  Soft, nontender and nondistended. Normal bowel sounds, without guarding, and without rebound.   Neurologic:  Alert and  oriented x4;  grossly normal neurologically.  Impression/Plan: [name] is here for an endoscopy to be performed for nausea  Risks, benefits, limitations, and alternatives regarding  endoscopy have been reviewed with the patient.  Questions have been answered.  All parties agreeable.   Lucilla Lame, MD  10/14/2018, 7:07 AM

## 2018-10-14 NOTE — Transfer of Care (Signed)
Immediate Anesthesia Transfer of Care Note  Patient: Joshua Guerrero  Procedure(s) Performed: ESOPHAGOGASTRODUODENOSCOPY (EGD) WITH BIOPSY (N/A Esophagus) ESOPHAGEAL DILATION (N/A Esophagus)  Patient Location: PACU  Anesthesia Type: General  Level of Consciousness: awake, alert  and patient cooperative  Airway and Oxygen Therapy: Patient Spontanous Breathing and Patient connected to supplemental oxygen  Post-op Assessment: Post-op Vital signs reviewed, Patient's Cardiovascular Status Stable, Respiratory Function Stable, Patent Airway and No signs of Nausea or vomiting  Post-op Vital Signs: Reviewed and stable  Complications: No apparent anesthesia complications

## 2018-10-14 NOTE — Anesthesia Procedure Notes (Signed)
Date/Time: 10/14/2018 7:24 AM Performed by: Cameron Ali, CRNA Pre-anesthesia Checklist: Patient identified, Emergency Drugs available, Suction available, Timeout performed and Patient being monitored Patient Re-evaluated:Patient Re-evaluated prior to induction Oxygen Delivery Method: Nasal cannula Placement Confirmation: positive ETCO2

## 2018-10-14 NOTE — Anesthesia Postprocedure Evaluation (Signed)
Anesthesia Post Note  Patient: Joshua Guerrero  Procedure(s) Performed: ESOPHAGOGASTRODUODENOSCOPY (EGD) WITH BIOPSY (N/A Esophagus) ESOPHAGEAL DILATION (N/A Esophagus)  Patient location during evaluation: PACU Anesthesia Type: General Level of consciousness: awake Pain management: pain level controlled Vital Signs Assessment: post-procedure vital signs reviewed and stable Respiratory status: respiratory function stable Cardiovascular status: stable Postop Assessment: no signs of nausea or vomiting Anesthetic complications: no    Veda Canning

## 2018-10-14 NOTE — Anesthesia Preprocedure Evaluation (Signed)
Anesthesia Evaluation  Patient identified by MRN, date of birth, ID band Patient awake    Reviewed: Allergy & Precautions, NPO status , Patient's Chart, lab work & pertinent test results  Airway Mallampati: II  TM Distance: >3 FB     Dental   Pulmonary sleep apnea , Current Smoker,    breath sounds clear to auscultation       Cardiovascular hypertension,  Rhythm:Regular Rate:Normal     Neuro/Psych  Headaches, Anxiety Depression Hx brain bleed - no surgery needed    GI/Hepatic GERD  ,  Endo/Other    Renal/GU      Musculoskeletal  (+) Arthritis ,   Abdominal   Peds  Hematology   Anesthesia Other Findings   Reproductive/Obstetrics                             Anesthesia Physical Anesthesia Plan  ASA: III  Anesthesia Plan: General   Post-op Pain Management:    Induction: Intravenous  PONV Risk Score and Plan:   Airway Management Planned: Nasal Cannula and Natural Airway  Additional Equipment:   Intra-op Plan:   Post-operative Plan:   Informed Consent: I have reviewed the patients History and Physical, chart, labs and discussed the procedure including the risks, benefits and alternatives for the proposed anesthesia with the patient or authorized representative who has indicated his/her understanding and acceptance.       Plan Discussed with: CRNA  Anesthesia Plan Comments:         Anesthesia Quick Evaluation

## 2018-10-14 NOTE — Op Note (Signed)
[facility] Gastroenterology Patient Name: Joshua Guerrero Procedure Date: 10/14/2018 7:17 AM MRN: [id number] Account #: [id number] Date of Birth: 12-29-1955 Admit Type: Outpatient Age: 63 Room: [facility] OR ROOM 01 Gender: Male Note Status: Finalized Procedure:            Upper GI endoscopy Indications:          Nausea Providers:            Lucilla Lame MD, MD Referring MD:         [name], MD (Referring MD) Medicines:            Propofol per Anesthesia Complications:        No immediate complications. Procedure:            Pre-Anesthesia Assessment:                       - Prior to the procedure, a History and Physical was                        performed, and patient medications and allergies were                        reviewed. The patient's tolerance of previous                        anesthesia was also reviewed. The risks and benefits of                        the procedure and the sedation options and risks were                        discussed with the patient. All questions were                        answered, and informed consent was obtained. Prior                        Anticoagulants: The patient has taken no previous                        anticoagulant or antiplatelet agents. ASA Grade                        Assessment: II - A patient with mild systemic disease.                        After reviewing the risks and benefits, the patient was                        deemed in satisfactory condition to undergo the                        procedure.                       After obtaining informed consent, the endoscope was                        passed under direct vision. Throughout the procedure,  the patient's blood pressure, pulse, and oxygen                        saturations were monitored continuously. The was                        introduced through the mouth, and advanced to the                        second part of  duodenum. The upper GI endoscopy was                        accomplished without difficulty. The patient tolerated                        the procedure well. Findings:      One benign-appearing, intrinsic moderate stenosis was found at the       gastroesophageal junction. The stenosis was traversed. A TTS dilator was       passed through the scope. Dilation with a 15-16.5-18 mm balloon dilator       was performed to 18 mm. The dilation site was examined following       endoscope reinsertion and showed complete resolution of luminal       narrowing.      One non-bleeding gastric ulcer with no stigmata of bleeding was found at       the pylorus. Biopsies were taken with a cold forceps for histology.      The examined duodenum was normal. Impression:           - Benign-appearing esophageal stenosis. Dilated.                       - Non-bleeding gastric ulcer with no stigmata of                        bleeding. Biopsied.                       - Normal examined duodenum. Recommendation:       - Await pathology results.                       - No aspirin, ibuprofen, naproxen, or other                        non-steroidal anti-inflammatory drugs. Procedure Code(s):    --- Professional ---                       [phone], Esophagogastroduodenoscopy, flexible, transoral;                        with transendoscopic balloon dilation of esophagus                        (less than 30 mm diameter)                       43239, 59, Esophagogastroduodenoscopy, flexible,                        transoral; with biopsy, single or multiple Diagnosis Code(s):    ---  Professional ---                       R11.0, Nausea                       K25.9, Gastric ulcer, unspecified as acute or chronic,                        without hemorrhage or perforation                       K22.2, Esophageal obstruction CPT copyright 2019 American Medical Association. All rights reserved. The codes documented in this report are  preliminary and upon coder review may  be revised to meet current compliance requirements. Lucilla Lame MD, MD 10/14/2018 7:37:26 AM This report has been signed electronically. Number of Addenda: 0 Note Initiated On: 10/14/2018 7:17 AM Estimated Blood Loss: Estimated blood loss: none.      [facility]

## 2018-10-17 ENCOUNTER — Encounter: Payer: Self-pay | Admitting: Gastroenterology

## 2018-10-17 NOTE — Telephone Encounter (Signed)
Patient requesting hydrochlorothiazide for swelling of legs.

## 2018-10-18 NOTE — Telephone Encounter (Signed)
Please review. Ok to send in?

## 2018-10-18 NOTE — Telephone Encounter (Signed)
25 mg q am,1 year rf.

## 2018-10-19 ENCOUNTER — Encounter: Payer: Self-pay | Admitting: Gastroenterology

## 2018-10-19 ENCOUNTER — Other Ambulatory Visit: Payer: Self-pay

## 2018-10-19 DIAGNOSIS — I10 Essential (primary) hypertension: Secondary | ICD-10-CM

## 2018-10-19 MED ORDER — HYDROCHLOROTHIAZIDE 25 MG PO TABS: 25.0000 mg | ORAL_TABLET | Freq: Every day | ORAL | 3 refills | Status: DC

## 2018-10-21 DIAGNOSIS — M961 Postlaminectomy syndrome, not elsewhere classified: Secondary | ICD-10-CM | POA: Diagnosis not present

## 2018-10-21 DIAGNOSIS — G894 Chronic pain syndrome: Secondary | ICD-10-CM | POA: Diagnosis not present

## 2018-10-21 DIAGNOSIS — M47817 Spondylosis without myelopathy or radiculopathy, lumbosacral region: Secondary | ICD-10-CM | POA: Diagnosis not present

## 2018-10-21 DIAGNOSIS — M47812 Spondylosis without myelopathy or radiculopathy, cervical region: Secondary | ICD-10-CM | POA: Diagnosis not present

## 2018-11-17 DIAGNOSIS — G894 Chronic pain syndrome: Secondary | ICD-10-CM | POA: Diagnosis not present

## 2018-11-17 DIAGNOSIS — M961 Postlaminectomy syndrome, not elsewhere classified: Secondary | ICD-10-CM | POA: Diagnosis not present

## 2018-11-17 DIAGNOSIS — M47817 Spondylosis without myelopathy or radiculopathy, lumbosacral region: Secondary | ICD-10-CM | POA: Diagnosis not present

## 2018-11-17 DIAGNOSIS — M47812 Spondylosis without myelopathy or radiculopathy, cervical region: Secondary | ICD-10-CM | POA: Diagnosis not present

## 2018-11-24 ENCOUNTER — Other Ambulatory Visit: Payer: Self-pay | Admitting: Family Medicine

## 2018-11-24 ENCOUNTER — Other Ambulatory Visit: Payer: Self-pay | Admitting: Gastroenterology

## 2018-11-24 DIAGNOSIS — M542 Cervicalgia: Secondary | ICD-10-CM

## 2018-11-24 DIAGNOSIS — F329 Major depressive disorder, single episode, unspecified: Secondary | ICD-10-CM

## 2018-11-24 DIAGNOSIS — F32A Depression, unspecified: Secondary | ICD-10-CM

## 2018-11-24 DIAGNOSIS — G8929 Other chronic pain: Secondary | ICD-10-CM

## 2018-11-24 DIAGNOSIS — K219 Gastro-esophageal reflux disease without esophagitis: Secondary | ICD-10-CM

## 2018-11-24 DIAGNOSIS — R110 Nausea: Secondary | ICD-10-CM

## 2018-12-12 ENCOUNTER — Ambulatory Visit: Payer: Self-pay | Admitting: Family Medicine

## 2018-12-15 DIAGNOSIS — G894 Chronic pain syndrome: Secondary | ICD-10-CM | POA: Diagnosis not present

## 2018-12-15 DIAGNOSIS — Z79891 Long term (current) use of opiate analgesic: Secondary | ICD-10-CM | POA: Diagnosis not present

## 2018-12-15 DIAGNOSIS — M47817 Spondylosis without myelopathy or radiculopathy, lumbosacral region: Secondary | ICD-10-CM | POA: Diagnosis not present

## 2018-12-15 DIAGNOSIS — M47812 Spondylosis without myelopathy or radiculopathy, cervical region: Secondary | ICD-10-CM | POA: Diagnosis not present

## 2018-12-15 DIAGNOSIS — M961 Postlaminectomy syndrome, not elsewhere classified: Secondary | ICD-10-CM | POA: Diagnosis not present

## 2018-12-27 DIAGNOSIS — M47816 Spondylosis without myelopathy or radiculopathy, lumbar region: Secondary | ICD-10-CM | POA: Diagnosis not present

## 2019-01-13 DIAGNOSIS — M47812 Spondylosis without myelopathy or radiculopathy, cervical region: Secondary | ICD-10-CM | POA: Diagnosis not present

## 2019-01-13 DIAGNOSIS — M961 Postlaminectomy syndrome, not elsewhere classified: Secondary | ICD-10-CM | POA: Diagnosis not present

## 2019-01-13 DIAGNOSIS — M47817 Spondylosis without myelopathy or radiculopathy, lumbosacral region: Secondary | ICD-10-CM | POA: Diagnosis not present

## 2019-01-13 DIAGNOSIS — G894 Chronic pain syndrome: Secondary | ICD-10-CM | POA: Diagnosis not present

## 2019-02-09 DIAGNOSIS — M47812 Spondylosis without myelopathy or radiculopathy, cervical region: Secondary | ICD-10-CM | POA: Diagnosis not present

## 2019-02-09 DIAGNOSIS — M47817 Spondylosis without myelopathy or radiculopathy, lumbosacral region: Secondary | ICD-10-CM | POA: Diagnosis not present

## 2019-02-09 DIAGNOSIS — M961 Postlaminectomy syndrome, not elsewhere classified: Secondary | ICD-10-CM | POA: Diagnosis not present

## 2019-02-09 DIAGNOSIS — G894 Chronic pain syndrome: Secondary | ICD-10-CM | POA: Diagnosis not present

## 2019-02-15 ENCOUNTER — Other Ambulatory Visit: Payer: Self-pay | Admitting: Family Medicine

## 2019-02-15 DIAGNOSIS — M549 Dorsalgia, unspecified: Secondary | ICD-10-CM

## 2019-02-15 DIAGNOSIS — G8929 Other chronic pain: Secondary | ICD-10-CM

## 2019-02-15 DIAGNOSIS — M542 Cervicalgia: Secondary | ICD-10-CM

## 2019-02-21 NOTE — Progress Notes (Deleted)
Patient: Joshua Guerrero Male    DOB: 1956-03-20   63 y.o.   MRN: [id number] Visit Date: 02/21/2019  Today's Provider: [name], MD   No chief complaint on file.  Subjective:     HPI   Depression, major, single episode, in partial remission (Joshua Guerrero) From 10/11/2018-Continue medications.   Chronic neck and back pain From 10/11/2018-Per Pain clinic. - prochlorperazine (COMPAZINE) 10 MG tablet; Take 1 tablet (10 mg total) by mouth every 8 (eight) hours as needed for nausea or vomiting.  Dispense: 90 tablet; Refill: 0 - ketorolac (TORADOL) injection 60 mg  Chronic nausea From 10/11/2018-Per GI.   Allergies  Allergen Reactions  . Acetaminophen Shortness Of Breath  . Codeine Itching and Nausea And Vomiting  . Opana [Oxymorphone] Swelling     Current Outpatient Medications:  .  Ascorbic Acid (VITAMIN C PO), Take by mouth. am, Disp: , Rfl:  .  B Complex Vitamins (VITAMIN B COMPLEX PO), Take by mouth. am, Disp: , Rfl:  .  baclofen (LIORESAL) 10 MG tablet, Take 5 mg by mouth 3 (three) times daily. STARTING AT LUNCHTIME, Disp: , Rfl:  .  buPROPion (WELLBUTRIN) 75 MG tablet, Take 1 tablet (75 mg total) by mouth daily with breakfast. (Patient not taking: Reported on 10/10/2018), Disp: 90 tablet, Rfl: 1 .  desvenlafaxine (PRISTIQ) 100 MG 24 hr tablet, TAKE 1 TABLET BY MOUTH DAILY, Disp: 30 tablet, Rfl: 5 .  famotidine (PEPCID) 40 MG tablet, Take 1 tablet (40 mg total) by mouth every evening. (Patient not taking: Reported on 10/14/2018), Disp: 30 tablet, Rfl: 1 .  fentaNYL (DURAGESIC - DOSED MCG/HR) 25 MCG/HR patch, , Disp: , Rfl:  .  fentaNYL (DURAGESIC - DOSED MCG/HR) 50 MCG/HR, Place 50 mcg onto the skin every 3 (three) days. , Disp: , Rfl:  .  hydrochlorothiazide (HYDRODIURIL) 25 MG tablet, Take 1 tablet (25 mg total) by mouth daily., Disp: 90 tablet, Rfl: 3 .  lansoprazole (PREVACID) 30 MG capsule, Take 1 capsule (30 mg total) by mouth 2 (two) times daily before a meal.,  Disp: 180 capsule, Rfl: 3 .  metoprolol succinate (TOPROL-XL) 50 MG 24 hr tablet, Take 1 tablet (50 mg total) by mouth daily. Take with or immediately following a meal., Disp: 90 tablet, Rfl: 1 .  Multiple Vitamin (MULTIVITAMIN) capsule, Take 1 capsule by mouth daily., Disp: , Rfl:  .  omeprazole (PRILOSEC) 40 MG capsule, TAKE 1 CAPSULE BY MOUTH DAILY, Disp: 30 capsule, Rfl: 5 .  ondansetron (ZOFRAN-ODT) 4 MG disintegrating tablet, Take 1 tablet (4 mg total) by mouth every 8 (eight) hours as needed for nausea or vomiting., Disp: 60 tablet, Rfl: 5 .  oxyCODONE-acetaminophen (PERCOCET) 10-325 MG tablet, Take 1 tablet by mouth every 4 (four) hours. 3AM, 7AM, 11AM, 3 PM, 7PM AND 11PM, Disp: , Rfl:  .  pantoprazole (PROTONIX) 40 MG tablet, Take 1 tablet (40 mg total) by mouth daily. (Patient not taking: Reported on 09/08/2018), Disp: 30 tablet, Rfl: 6 .  pregabalin (LYRICA) 50 MG capsule, , Disp: , Rfl:  .  prochlorperazine (COMPAZINE) 10 MG tablet, TAKE 1 TABLET BY MOUTH EVERY 8 HOURS AS NEEDED FOR NAUSEA OR VOMITING., Disp: 90 tablet, Rfl: 0 .  promethazine (PHENERGAN) 25 MG tablet, TAKE 1 TABLET BY MOUTH EVERY 8 HOURS AS NEEDED FOR NAUSEA OR VOMITING, Disp: 60 tablet, Rfl: 3 .  sucralfate (CARAFATE) 1 g tablet, TAKE 1 TABLET BY MOUTH 4 TIMES A DAY WITH MEALS AND  AT BEDTIME., Disp: 120 tablet, Rfl: 11  Review of Systems  Constitutional: Negative for appetite change, chills and fever.  Respiratory: Negative for chest tightness, shortness of breath and wheezing.   Cardiovascular: Negative for chest pain and palpitations.  Gastrointestinal: Negative for abdominal pain, nausea and vomiting.    Social History   Tobacco Use  . Smoking status: Heavy Tobacco Smoker    Packs/day: 1.00    Years: 20.00    Pack years: 20.00    Types: Cigarettes  . Smokeless tobacco: Never Used  . Tobacco comment: currently about 1/2 PPD  Substance Use Topics  . Alcohol use: No      Objective:   There were no vitals  taken for this visit. There were no vitals filed for this visit.There is no height or weight on file to calculate BMI.   Physical Exam   No results found for any visits on 02/22/19.     Assessment & Plan        [name], MD  [facility]

## 2019-02-22 ENCOUNTER — Ambulatory Visit: Payer: PPO | Admitting: Family Medicine

## 2019-02-23 ENCOUNTER — Telehealth: Payer: Self-pay

## 2019-02-23 NOTE — Telephone Encounter (Signed)
Contacted pt to schedule MRI to re evaluate the lesion seen on his pancreas from CT scan on 07/19/18. Pt stated he was not ready to schedule this because he was in a financial bind right now. Pt would like a call back in January 2021.

## 2019-02-23 NOTE — Telephone Encounter (Signed)
-----   Message from [place], [place] sent at 07/22/2018  2:40 PM EDT ----- Pt will need MRI abdomen to re evaluate lesion on pancreas. Pt aware.

## 2019-03-09 DIAGNOSIS — M47812 Spondylosis without myelopathy or radiculopathy, cervical region: Secondary | ICD-10-CM | POA: Diagnosis not present

## 2019-03-09 DIAGNOSIS — G894 Chronic pain syndrome: Secondary | ICD-10-CM | POA: Diagnosis not present

## 2019-03-09 DIAGNOSIS — M47817 Spondylosis without myelopathy or radiculopathy, lumbosacral region: Secondary | ICD-10-CM | POA: Diagnosis not present

## 2019-03-09 DIAGNOSIS — M961 Postlaminectomy syndrome, not elsewhere classified: Secondary | ICD-10-CM | POA: Diagnosis not present

## 2019-03-21 NOTE — Progress Notes (Signed)
Patient: Joshua Guerrero Male    DOB: May 30, 1955   63 y.o.   MRN: [id number] Visit Date: 03/22/2019  Today's Provider: [name], MD   Chief Complaint  Patient presents with  . Insomnia   Subjective:     Insomnia Primary symptoms: difficulty falling asleep.  The current episode started more than one month. The onset quality is gradual. The problem occurs nightly. The problem is unchanged. The symptoms are aggravated by tobacco. How many beverages per day that contain caffeine: 0 - 1.  The symptoms are relieved by medication. Past treatments include medication. The treatment provided mild relief. Typical bedtime:  8-10 P.M. (But Can not fall asleep until 2 am).  How long after going to bed to you fall asleep: over an hour.    Chronic insomnia as above.  Depression, major, single episode, in partial remission ([place]) From 10/11/2018-Continue meds.  Allergies  Allergen Reactions  . Acetaminophen Shortness Of Breath  . Codeine Itching and Nausea And Vomiting  . Opana [Oxymorphone] Swelling     Current Outpatient Medications:  .  Ascorbic Acid (VITAMIN C PO), Take by mouth. am, Disp: , Rfl:  .  baclofen (LIORESAL) 10 MG tablet, Take 5 mg by mouth 3 (three) times daily. STARTING AT LUNCHTIME, Disp: , Rfl:  .  desvenlafaxine (PRISTIQ) 100 MG 24 hr tablet, TAKE 1 TABLET BY MOUTH DAILY, Disp: 30 tablet, Rfl: 5 .  fentaNYL (DURAGESIC - DOSED MCG/HR) 25 MCG/HR patch, , Disp: , Rfl:  .  hydrochlorothiazide (HYDRODIURIL) 25 MG tablet, Take 1 tablet (25 mg total) by mouth daily., Disp: 90 tablet, Rfl: 3 .  lansoprazole (PREVACID) 30 MG capsule, Take 1 capsule (30 mg total) by mouth 2 (two) times daily before a meal., Disp: 180 capsule, Rfl: 3 .  metoprolol succinate (TOPROL-XL) 50 MG 24 hr tablet, Take 1 tablet (50 mg total) by mouth daily. Take with or immediately following a meal., Disp: 90 tablet, Rfl: 1 .  Multiple Vitamin (MULTIVITAMIN) capsule, Take 1 capsule by mouth  daily., Disp: , Rfl:  .  omeprazole (PRILOSEC) 40 MG capsule, TAKE 1 CAPSULE BY MOUTH DAILY, Disp: 30 capsule, Rfl: 5 .  ondansetron (ZOFRAN-ODT) 4 MG disintegrating tablet, Take 1 tablet (4 mg total) by mouth every 8 (eight) hours as needed for nausea or vomiting., Disp: 60 tablet, Rfl: 5 .  oxyCODONE-acetaminophen (PERCOCET) 10-325 MG tablet, Take 1 tablet by mouth every 4 (four) hours. 3AM, 7AM, 11AM, 3 PM, 7PM AND 11PM, Disp: , Rfl:  .  prochlorperazine (COMPAZINE) 10 MG tablet, TAKE 1 TABLET BY MOUTH EVERY 8 HOURS AS NEEDED FOR NAUSEA OR VOMITING., Disp: 90 tablet, Rfl: 0 .  promethazine (PHENERGAN) 25 MG tablet, TAKE 1 TABLET BY MOUTH EVERY 8 HOURS AS NEEDED FOR NAUSEA OR VOMITING, Disp: 60 tablet, Rfl: 3 .  sucralfate (CARAFATE) 1 g tablet, TAKE 1 TABLET BY MOUTH 4 TIMES A DAY WITH MEALS AND AT BEDTIME., Disp: 120 tablet, Rfl: 11 .  topiramate (TOPAMAX) 50 MG tablet, Take 50 mg by mouth 2 (two) times daily., Disp: , Rfl:  .  B Complex Vitamins (VITAMIN B COMPLEX PO), Take by mouth. am, Disp: , Rfl:   Review of Systems  Constitutional: Negative for appetite change, chills and fever.  HENT: Negative.   Eyes: Negative.   Respiratory: Negative for chest tightness, shortness of breath and wheezing.   Cardiovascular: Negative for chest pain and palpitations.  Gastrointestinal: Negative for abdominal pain, nausea and  vomiting.  Endocrine: Negative.   Genitourinary: Negative.   Musculoskeletal: Positive for back pain.  Allergic/Immunologic: Negative.   Psychiatric/Behavioral: The patient has insomnia.     Social History   Tobacco Use  . Smoking status: Heavy Tobacco Smoker    Packs/day: 1.00    Years: 20.00    Pack years: 20.00    Types: Cigarettes  . Smokeless tobacco: Never Used  . Tobacco comment: currently about 1/2 PPD  Substance Use Topics  . Alcohol use: No      Objective:   There were no vitals taken for this visit. There were no vitals filed for this visit.There is no  height or weight on file to calculate BMI.   Physical Exam Vitals signs reviewed.  Constitutional:      Appearance: He is well-developed.  HENT:     Head: Normocephalic and atraumatic.     Right Ear: External ear normal.     Left Ear: External ear normal.     Nose: Nose normal.  Eyes:     General: No scleral icterus.    Conjunctiva/sclera: Conjunctivae normal.  Neck:     Thyroid: No thyromegaly.  Cardiovascular:     Rate and Rhythm: Normal rate and regular rhythm.     Heart sounds: Normal heart sounds.  Pulmonary:     Effort: Pulmonary effort is normal.     Breath sounds: Normal breath sounds.  Abdominal:     General: There is no distension.     Palpations: Abdomen is soft.     Tenderness: There is no abdominal tenderness.  Lymphadenopathy:     Cervical: No cervical adenopathy.  Skin:    General: Skin is dry.  Neurological:     General: No focal deficit present.     Mental Status: He is alert and oriented to person, place, and time.  Psychiatric:        Mood and Affect: Mood normal.        Behavior: Behavior normal.        Thought Content: Thought content normal.        Judgment: Judgment normal.      No results found for any visits on 03/22/19.     Assessment & Plan    1. Essential hypertension  - metoprolol succinate (TOPROL-XL) 50 MG 24 hr tablet; Take 1 tablet (50 mg total) by mouth daily. Take with or immediately following a meal.  Dispense: 90 tablet; Refill: 1  2. Obstructive sleep apnea syndrome  - hydrOXYzine (ATARAX/VISTARIL) 50 MG tablet; Take 1 tablet (50 mg total) by mouth 3 (three) times daily as needed (Use as needed for Sleep).  Dispense: 30 tablet; Refill: 5  3. Chronic nausea   4. Chronic neck and back pain Followed by pain clinic.More than 50% 25 minute visit spent in counseling or coordination of care  - prochlorperazine (COMPAZINE) 10 MG tablet; TAKE 1 TABLET BY MOUTH EVERY 8 HOURS AS NEEDED FOR NAUSEA OR VOMITING.  Dispense: 90  tablet; Refill: 0  5. Need for influenza vaccination  - Flu Vaccine QUAD 6+ mos PF IM (Fluarix Quad PF)  6. Other migraine without status migrainosus, not intractable  - topiramate (TOPAMAX) 50 MG tablet; Take 50 mg by mouth 2 (two) times daily. - ketorolac (TORADOL) injection 60 mg 7.Chronic Insomnia Would not add benzodiazepine or sedative for this patient.  Try hydroxyzine 50 mg nightly.    Richard Cranford Mon, MD  [facility]

## 2019-03-22 ENCOUNTER — Ambulatory Visit (INDEPENDENT_AMBULATORY_CARE_PROVIDER_SITE_OTHER): Payer: PPO | Admitting: Family Medicine

## 2019-03-22 ENCOUNTER — Telehealth: Payer: Self-pay | Admitting: Family Medicine

## 2019-03-22 ENCOUNTER — Other Ambulatory Visit: Payer: Self-pay

## 2019-03-22 DIAGNOSIS — M549 Dorsalgia, unspecified: Secondary | ICD-10-CM | POA: Diagnosis not present

## 2019-03-22 DIAGNOSIS — M542 Cervicalgia: Secondary | ICD-10-CM | POA: Diagnosis not present

## 2019-03-22 DIAGNOSIS — G4733 Obstructive sleep apnea (adult) (pediatric): Secondary | ICD-10-CM

## 2019-03-22 DIAGNOSIS — G43809 Other migraine, not intractable, without status migrainosus: Secondary | ICD-10-CM

## 2019-03-22 DIAGNOSIS — G8929 Other chronic pain: Secondary | ICD-10-CM

## 2019-03-22 DIAGNOSIS — Z23 Encounter for immunization: Secondary | ICD-10-CM

## 2019-03-22 DIAGNOSIS — I10 Essential (primary) hypertension: Secondary | ICD-10-CM

## 2019-03-22 DIAGNOSIS — R110 Nausea: Secondary | ICD-10-CM | POA: Diagnosis not present

## 2019-03-22 MED ORDER — HYDROXYZINE HCL 50 MG PO TABS: 50.0000 mg | ORAL_TABLET | Freq: Three times a day (TID) | ORAL | 3 refills | Status: DC | PRN

## 2019-03-22 MED ORDER — KETOROLAC TROMETHAMINE 60 MG/2ML IM SOLN
60.0000 mg | Freq: Once | INTRAMUSCULAR | Status: AC
  Administered 2019-03-22: 60 mg via INTRAMUSCULAR

## 2019-03-22 MED ORDER — METOPROLOL SUCCINATE ER 50 MG PO TB24: 50.0000 mg | ORAL_TABLET | Freq: Every day | ORAL | 1 refills | Status: DC

## 2019-03-22 MED ORDER — PROCHLORPERAZINE MALEATE 10 MG PO TABS: ORAL_TABLET | ORAL | 0 refills | Status: DC

## 2019-03-22 MED ORDER — HYDROXYZINE HCL 50 MG PO TABS: 50.0000 mg | ORAL_TABLET | Freq: Three times a day (TID) | ORAL | 0 refills | Status: DC | PRN

## 2019-03-22 MED ORDER — HYDROXYZINE HCL 50 MG PO TABS: 50.0000 mg | ORAL_TABLET | Freq: Three times a day (TID) | ORAL | 5 refills | Status: DC | PRN

## 2019-03-22 NOTE — Telephone Encounter (Signed)
Patient's pharmacy requesting call back from [place] regarding the medication hydrOXYzine (ATARAX/VISTARIL) 50 MG tablet. Pharmacy states multiple RX has been received with different dosage/instructions. They would like clarification on which to fill. Please advise.

## 2019-03-22 NOTE — Telephone Encounter (Signed)
Called and spoke with pharmacy. The prescription has been corrected for the Visatril 50mg   Up to 3 times daily as needed for sleep 90qty with 3 refills.

## 2019-03-22 NOTE — Patient Instructions (Signed)
Try taking the hydroxyzine for the Sleep apnea.

## 2019-04-07 DIAGNOSIS — M961 Postlaminectomy syndrome, not elsewhere classified: Secondary | ICD-10-CM | POA: Diagnosis not present

## 2019-04-07 DIAGNOSIS — M47812 Spondylosis without myelopathy or radiculopathy, cervical region: Secondary | ICD-10-CM | POA: Diagnosis not present

## 2019-04-07 DIAGNOSIS — M47817 Spondylosis without myelopathy or radiculopathy, lumbosacral region: Secondary | ICD-10-CM | POA: Diagnosis not present

## 2019-04-07 DIAGNOSIS — G894 Chronic pain syndrome: Secondary | ICD-10-CM | POA: Diagnosis not present

## 2019-04-27 ENCOUNTER — Other Ambulatory Visit: Payer: Self-pay | Admitting: Family Medicine

## 2019-04-27 DIAGNOSIS — G8929 Other chronic pain: Secondary | ICD-10-CM

## 2019-04-27 DIAGNOSIS — M542 Cervicalgia: Secondary | ICD-10-CM

## 2019-04-27 NOTE — Telephone Encounter (Signed)
Requested medication (s) are due for refill today: yes  Requested medication (s) are on the active medication list: yes  Last refill:  03/22/2019  Future visit scheduled: yes  Notes to clinic: This refill cannot be delegated    Requested Prescriptions  Pending Prescriptions Disp Refills   prochlorperazine (COMPAZINE) 10 MG tablet [Pharmacy Med Name: PROCHLORPERAZINE MALEATE 10 MG TAB] 90 tablet 0    Sig: TAKE 1 TABLET BY MOUTH EVERY 8 HOURS AS NEEDED FOR NAUSEA OR VOMITING.      Not Delegated - Gastroenterology: Antiemetics Failed - 04/27/2019  9:58 AM      Failed - This refill cannot be delegated      Passed - Valid encounter within last 6 months    Recent Outpatient Visits           1 month ago Essential hypertension   [facility] Jerrol Banana., MD   6 months ago Depression, major, single episode, in partial remission [facility])   [facility] Jerrol Banana., MD   7 months ago Depression, major, single episode, in partial remission [facility])   [facility] Jerrol Banana., MD   8 months ago Fall, initial encounter   [facility] Jerrol Banana., MD   9 months ago Chronic nausea   [place], [place]       Future Appointments             In 3 weeks Jerrol Banana., MD [facility], [place]

## 2019-05-02 ENCOUNTER — Other Ambulatory Visit: Payer: Self-pay | Admitting: Family Medicine

## 2019-05-02 DIAGNOSIS — R110 Nausea: Secondary | ICD-10-CM

## 2019-05-02 NOTE — Telephone Encounter (Signed)
Requested medication (s) are due for refill today: yes  Requested medication (s) are on the active medication list: yes  Last refill:  03/10/2019  Future visit scheduled: no  Notes to clinic: historical provider; not delegated    Requested Prescriptions  Pending Prescriptions Disp Refills   promethazine (PHENERGAN) 25 MG tablet [Pharmacy Med Name: PROMETHAZINE HCL 25 MG TAB] 60 tablet 3    Sig: TAKE 1 TABLET BY MOUTH EVERY 8 HOURS AS NEEDED FOR NAUSEA OR VOMITING      Not Delegated - Gastroenterology: Antiemetics Failed - 05/02/2019 11:30 AM      Failed - This refill cannot be delegated      Passed - Valid encounter within last 6 months    Recent Outpatient Visits           1 month ago Essential hypertension   [facility] Jerrol Banana., MD   6 months ago Depression, major, single episode, in partial remission [facility])   [facility] Jerrol Banana., MD   7 months ago Depression, major, single episode, in partial remission [facility])   [facility] Jerrol Banana., MD   8 months ago Fall, initial encounter   [facility] Jerrol Banana., MD   9 months ago Chronic nausea   [place], [place]       Future Appointments             In 3 weeks Jerrol Banana., MD [facility], [place]

## 2019-05-09 DIAGNOSIS — M961 Postlaminectomy syndrome, not elsewhere classified: Secondary | ICD-10-CM | POA: Diagnosis not present

## 2019-05-09 DIAGNOSIS — G894 Chronic pain syndrome: Secondary | ICD-10-CM | POA: Diagnosis not present

## 2019-05-09 DIAGNOSIS — M47812 Spondylosis without myelopathy or radiculopathy, cervical region: Secondary | ICD-10-CM | POA: Diagnosis not present

## 2019-05-09 DIAGNOSIS — M47817 Spondylosis without myelopathy or radiculopathy, lumbosacral region: Secondary | ICD-10-CM | POA: Diagnosis not present

## 2019-05-09 NOTE — Progress Notes (Signed)
Cancelled apt and r/s to 05/16/19 @ 2:40 PM. This encounter was created in error - please disregard.

## 2019-05-10 ENCOUNTER — Other Ambulatory Visit: Payer: Self-pay

## 2019-05-15 NOTE — Progress Notes (Signed)
Subjective:   Joshua Guerrero is a 64 y.o. male who presents for Medicare Intial  preventive examination.    This visit is being conducted through telemedicine due to the COVID-19 pandemic. This patient has given me verbal consent via doximity to conduct this visit, patient states they are participating from their home address. Some vital signs may be absent or patient reported.    Patient identification: identified by name, DOB, and current address  Review of Systems:  N/A  Cardiac Risk Factors include: advanced age (>63men, >25 women);dyslipidemia;hypertension;male gender;smoking/ tobacco exposure     Objective:    Vitals: There were no vitals taken for this visit.  There is no height or weight on file to calculate BMI. Unable to obtain vitals due to visit being conducted via telephonically.   Advanced Directives 05/16/2019 10/14/2018 02/24/2017 05/26/2016 05/25/2016 05/24/2016 02/06/2016  Does Patient Have a Medical Advance Directive? Yes Yes Yes Yes Yes (No Data) No  Type of Paramedic of [place];Living will Living will;Healthcare Power of [place] will Living will - -  Does patient want to make changes to medical advance directive? - No - Patient declined - No - Patient declined No - Patient declined - -  Copy of [place] in Chart? Yes - validated most recent copy scanned in chart (See row information) No - copy requested No - copy requested No - copy requested No - copy requested - -    Tobacco Social History   Tobacco Use  Smoking Status Heavy Tobacco Smoker  . Packs/day: 1.00  . Years: 20.00  . Pack years: 20.00  . Types: Cigarettes  Smokeless Tobacco Never Used  Tobacco Comment   currently about 1/2 PPD     Ready to quit: Yes Counseling given: No Comment: currently about 1/2 PPD   Clinical Intake:  Pre-visit preparation completed: Yes  Pain : 0-10 Pain Score: 9  Pain Type: Chronic  pain Pain Location: Back Pain Orientation: Lower Pain Descriptors / Indicators: Aching Pain Frequency: Constant     Nutritional Risks: None Diabetes: No  How often do you need to have someone help you when you read instructions, pamphlets, or other written materials from your doctor or pharmacy?: 1 - Never  Interpreter Needed?: No  Information entered by :: [facility], LPN  Past Medical History:  Diagnosis Date  . Arthritis    lower back  . Brain bleed ([place]) 10/2016   AT [facility]-TREATED MEDICALLY NO SURGERY  . Confusion caused by a drug    fentanyl and oxycodone  . GERD (gastroesophageal reflux disease)   . Headache    past hx, secondary to nerve damage from accident  . Hypertension    CONTROLLED ON MEDS  . Neck pain, chronic   . Neuromuscular disorder ([place])    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  . Sleep apnea    Not currently using a C-Pap   Past Surgical History:  Procedure Laterality Date  . COLONOSCOPY WITH PROPOFOL N/A 02/11/2015   Procedure: COLONOSCOPY WITH PROPOFOL;  Surgeon: Lucilla Lame, MD;  Location: [facility];  Service: Endoscopy;  Laterality: N/A;  USES C-PAP  . ESOPHAGEAL DILATION N/A 10/14/2018   Procedure: ESOPHAGEAL DILATION;  Surgeon: Lucilla Lame, MD;  Location: [facility];  Service: Endoscopy;  Laterality: N/A;  . ESOPHAGOGASTRODUODENOSCOPY (EGD) WITH PROPOFOL N/A 10/14/2018   Procedure: ESOPHAGOGASTRODUODENOSCOPY (EGD) WITH BIOPSY;  Surgeon: Lucilla Lame, MD;  Location: [facility];  Service: Endoscopy;  Laterality: N/A;  sleep apnea  . NASAL SINUS SURGERY     due to severe sleep apnea  . NECK SURGERY     diffuse disc-and put in possibly screws patient thinks  . SHOULDER SURGERY Bilateral    x 2  . UMBILICAL HERNIA REPAIR N/A 02/26/2017   Primary repair.  Surgeon: Robert Bellow, MD;  Location: [facility];  Service: General;  Laterality: N/A;   Family History  Problem Relation Age of Onset  . Dementia  Father   . Cancer Mother   . Cancer Sister   . Cancer Maternal Aunt    Social History   Socioeconomic History  . Marital status: Divorced    Spouse name: single  . Number of children: 0  . Years of education: 60  . Highest education level: High school graduate  Occupational History  . Occupation: disability  Tobacco Use  . Smoking status: Heavy Tobacco Smoker    Packs/day: 1.00    Years: 20.00    Pack years: 20.00    Types: Cigarettes  . Smokeless tobacco: Never Used  . Tobacco comment: currently about 1/2 PPD  Substance and Sexual Activity  . Alcohol use: No  . Drug use: No  . Sexual activity: Never  Other Topics Concern  . Not on file  Social History Narrative  . Not on file   Social Determinants of Health   Financial Resource Strain: Low Risk   . Difficulty of Paying Living Expenses: Not hard at all  Food Insecurity: No Food Insecurity  . Worried About Charity fundraiser in the Last Year: Never true  . Ran Out of Food in the Last Year: Never true  Transportation Needs: No Transportation Needs  . Lack of Transportation (Medical): No  . Lack of Transportation (Non-Medical): No  Physical Activity: Inactive  . Days of Exercise per Week: 0 days  . Minutes of Exercise per Session: 0 min  Stress: No Stress Concern Present  . Feeling of Stress : Not at all  Social Connections: Moderately Isolated  . Frequency of Communication with Friends and Family: More than three times a week  . Frequency of Social Gatherings with Friends and Family: Three times a week  . Attends Religious Services: Never  . Active Member of Clubs or Organizations: No  . Attends Archivist Meetings: Never  . Marital Status: Divorced    Outpatient Encounter Medications as of 05/16/2019  Medication Sig  . Ascorbic Acid (VITAMIN C PO) Take by mouth. am  . B Complex Vitamins (VITAMIN B COMPLEX PO) Take by mouth. am  . baclofen (LIORESAL) 10 MG tablet Take 5 mg by mouth 4 (four) times  daily. STARTING AT LUNCHTIME  . desvenlafaxine (PRISTIQ) 100 MG 24 hr tablet TAKE 1 TABLET BY MOUTH DAILY  . fentaNYL (DURAGESIC - DOSED MCG/HR) 25 MCG/HR patch   . hydrochlorothiazide (HYDRODIURIL) 25 MG tablet Take 1 tablet (25 mg total) by mouth daily.  . hydrOXYzine (ATARAX/VISTARIL) 50 MG tablet Take 1 tablet (50 mg total) by mouth 3 (three) times daily as needed (Use as needed for Sleep).  . lansoprazole (PREVACID) 30 MG capsule Take 1 capsule (30 mg total) by mouth 2 (two) times daily before a meal.  . metoprolol succinate (TOPROL-XL) 50 MG 24 hr tablet Take 1 tablet (50 mg total) by mouth daily. Take with or immediately following a meal.  . Multiple Vitamin (MULTIVITAMIN) capsule Take 1 capsule by mouth daily.  Marland Kitchen  omeprazole (PRILOSEC) 40 MG capsule TAKE 1 CAPSULE BY MOUTH DAILY  . ondansetron (ZOFRAN-ODT) 4 MG disintegrating tablet Take 1 tablet (4 mg total) by mouth every 8 (eight) hours as needed for nausea or vomiting.  Marland Kitchen oxyCODONE-acetaminophen (PERCOCET) 10-325 MG tablet Take 1 tablet by mouth every 4 (four) hours. 3AM, 7AM, 11AM, 3 PM, 7PM AND 11PM  . prochlorperazine (COMPAZINE) 10 MG tablet TAKE 1 TABLET BY MOUTH EVERY 8 HOURS AS NEEDED FOR NAUSEA OR VOMITING  . promethazine (PHENERGAN) 25 MG tablet TAKE 1 TABLET BY MOUTH EVERY 8 HOURS AS NEEDED FOR NAUSEA OR VOMITING  . sucralfate (CARAFATE) 1 g tablet TAKE 1 TABLET BY MOUTH 4 TIMES A DAY WITH MEALS AND AT BEDTIME.  Marland Kitchen topiramate (TOPAMAX) 50 MG tablet Take 50 mg by mouth 2 (two) times daily.   No facility-administered encounter medications on file as of 05/16/2019.    Activities of Daily Living In your present state of health, do you have any difficulty performing the following activities: 05/16/2019 10/14/2018  Hearing? Y Y  Comment Does not wear hearing aids. -  Vision? N N  Difficulty concentrating or making decisions? N N  Walking or climbing stairs? N N  Dressing or bathing? N N  Doing errands, shopping? N -  Preparing  Food and eating ? N -  Using the Toilet? N -  In the past six months, have you accidently leaked urine? N -  Do you have problems with loss of bowel control? N -  Managing your Medications? N -  Managing your Finances? N -  Housekeeping or managing your Housekeeping? N -  Some recent data might be hidden    Patient Care Team: Jerrol Banana., MD as PCP - General (Family Medicine) Jerrol Banana., MD (Family Medicine) Byrnett, Forest Gleason, MD (General Surgery) Lucilla Lame, MD as Consulting Physician (Gastroenterology) Margaretha Sheffield, MD as Referring Physician (Physical Medicine and Rehabilitation)   Assessment:   This is a routine wellness examination for Sabin.  Exercise Activities and Dietary recommendations Current Exercise Habits: The patient does not participate in regular exercise at present, Exercise limited by: orthopedic condition(s)  Goals    . Quit Smoking     Recommend to quit smoking as planned in Spring 2021.        Fall Risk: Fall Risk  05/16/2019 09/08/2018 03/16/2018  Falls in the past year? 0 0 0  Number falls in past yr: 0 - -  Injury with Fall? 0 - -    FALL RISK PREVENTION PERTAINING TO THE HOME:  Any stairs in or around the home? No  If so, are there any without handrails? N/A  Home free of loose throw rugs in walkways, pet beds, electrical cords, etc? Yes  Adequate lighting in your home to reduce risk of falls? Yes   ASSISTIVE DEVICES UTILIZED TO PREVENT FALLS:  Life alert? No  Use of a cane, walker or w/c? No  Grab bars in the bathroom? Yes  Shower chair or bench in shower? No  Elevated toilet seat or a handicapped toilet? Yes   TIMED UP AND GO:  Was the test performed? No .    Depression Screen PHQ 2/9 Scores 05/16/2019 03/22/2019 09/08/2018 03/16/2018  PHQ - 2 Score 0 0 3 4  PHQ- 9 Score - 3 5 -    Cognitive Function : Declined today.        Immunization History  Administered Date(s) Administered  .  Influenza,inj,Quad PF,6+ Mos 02/20/2015,  05/26/2016, 12/23/2016, 03/07/2018, 03/22/2019  . Pneumococcal Polysaccharide-23 05/26/2016  . Td 08/17/2018  . Tdap 07/31/2008    Qualifies for Shingles Vaccine? Yes . Due for Shingrix. Pt has been advised to call insurance company to determine out of pocket expense. Advised may also receive vaccine at local pharmacy or Health Dept. Verbalized acceptance and understanding.  Tdap: Up to date  Flu Vaccine: Up to date   Screening Tests Health Maintenance  Topic Date Due  . Hepatitis C Screening  1955/06/16  . HIV Screening  10/30/1972  . COLONOSCOPY  02/10/2025  . TETANUS/TDAP  08/16/2028  . INFLUENZA VACCINE  Completed   Cancer Screenings:  Colorectal Screening: Completed 02/11/15. Repeat every 10 years.   Lung Cancer Screening: (Low Dose CT Chest recommended if Age 43-80 years, 30 pack-year currently smoking OR have quit w/in 15years.) does qualify but declined an future order for this.   Additional Screening:  Hepatitis C Screening: does qualify and would like this added to blood work orders at next in office apt.   Vision Screening: Recommended annual ophthalmology exams for early detection of glaucoma and other disorders of the eye.  Dental Screening: Recommended annual dental exams for proper oral hygiene  Community Resource Referral:  CRR required this visit?  No       Plan:  I have personally reviewed and addressed the Medicare Annual Wellness questionnaire and have noted the following in the patient's chart:  A. Medical and social history B. Use of alcohol, tobacco or illicit drugs  C. Current medications and supplements D. Functional ability and status E.  Nutritional status F.  Physical activity G. Advance directives H. List of other physicians I.  Hospitalizations, surgeries, and ER visits in previous 12 months J.  [place] such as hearing and vision if needed, cognitive and depression L. Referrals  and appointments   In addition, I have reviewed and discussed with patient certain preventive protocols, quality metrics, and best practice recommendations. A written personalized care plan for preventive services as well as general preventive health recommendations were provided to patient.   [place] Score, [place]  075-[facility] Nurse Health Advisor   Nurse Notes: Pt would like to add the Hep C and HIV lab orders onto the next in office apt blood work orders.

## 2019-05-16 ENCOUNTER — Other Ambulatory Visit: Payer: Self-pay

## 2019-05-16 ENCOUNTER — Ambulatory Visit (INDEPENDENT_AMBULATORY_CARE_PROVIDER_SITE_OTHER): Payer: PPO

## 2019-05-16 DIAGNOSIS — Z0000 Encounter for general adult medical examination without abnormal findings: Secondary | ICD-10-CM

## 2019-05-16 NOTE — Patient Instructions (Signed)
Joshua Guerrero , Thank you for taking time to come for your Medicare Wellness Visit. I appreciate your ongoing commitment to your health goals. Please review the following plan we discussed and let me know if I can assist you in the future.   Screening recommendations/referrals: Colonoscopy: Up to date, due 02/2025 Recommended yearly ophthalmology/optometry visit for glaucoma screening and checkup Recommended yearly dental visit for hygiene and checkup  Vaccinations: Influenza vaccine: Up to date Tdap vaccine: Up to date, due 08/2028 Shingles vaccine: Pt declines today.     Advanced directives: Currently on file.  Conditions/risks identified: Smoking cessation discussed today.   Next appointment: 05/24/19 @ 1:20 PM with Dr Rosanna Randy.  Preventive Care 40-64 Years, Male Preventive care refers to lifestyle choices and visits with your health care provider that can promote health and wellness. What does preventive care include?  A yearly physical exam. This is also called an annual well check.  Dental exams once or twice a year.  Routine eye exams. Ask your health care provider how often you should have your eyes checked.  Personal lifestyle choices, including:  Daily care of your teeth and gums.  Regular physical activity.  Eating a healthy diet.  Avoiding tobacco and drug use.  Limiting alcohol use.  Practicing safe sex.  Taking low-dose aspirin every day starting at age 39. What happens during an annual well check? The services and screenings done by your health care provider during your annual well check will depend on your age, overall health, lifestyle risk factors, and family history of disease. Counseling  Your health care provider may ask you questions about your:  Alcohol use.  Tobacco use.  Drug use.  Emotional well-being.  Home and relationship well-being.  Sexual activity.  Eating habits.  Work and work Statistician. Screening  You may have the  following tests or measurements:  Height, weight, and BMI.  Blood pressure.  Lipid and cholesterol levels. These may be checked every 5 years, or more frequently if you are over 58 years old.  Skin check.  Lung cancer screening. You may have this screening every year starting at age 5 if you have a 30-pack-year history of smoking and currently smoke or have quit within the past 15 years.  Fecal occult blood test (FOBT) of the stool. You may have this test every year starting at age 29.  Flexible sigmoidoscopy or colonoscopy. You may have a sigmoidoscopy every 5 years or a colonoscopy every 10 years starting at age 65.  Prostate cancer screening. Recommendations will vary depending on your family history and other risks.  Hepatitis C blood test.  Hepatitis B blood test.  Sexually transmitted disease (STD) testing.  Diabetes screening. This is done by checking your blood sugar (glucose) after you have not eaten for a while (fasting). You may have this done every 1-3 years. Discuss your test results, treatment options, and if necessary, the need for more tests with your health care provider. Vaccines  Your health care provider may recommend certain vaccines, such as:  Influenza vaccine. This is recommended every year.  Tetanus, diphtheria, and acellular pertussis (Tdap, Td) vaccine. You may need a Td booster every 10 years.  Zoster vaccine. You may need this after age 4.  Pneumococcal 13-valent conjugate (PCV13) vaccine. You may need this if you have certain conditions and have not been vaccinated.  Pneumococcal polysaccharide (PPSV23) vaccine. You may need one or two doses if you smoke cigarettes or if you have certain conditions. Talk to your  health care provider about which screenings and vaccines you need and how often you need them. This information is not intended to replace advice given to you by your health care provider. Make sure you discuss any questions you have with  your health care provider. Document Released: 05/17/2015 Document Revised: 01/08/2016 Document Reviewed: 02/19/2015 Elsevier Interactive Patient Education  2017 [place] Prevention in the Home Falls can cause injuries. They can happen to people of all ages. There are many things you can do to make your home safe and to help prevent falls. What can I do on the outside of my home?  Regularly fix the edges of walkways and driveways and fix any cracks.  Remove anything that might make you trip as you walk through a door, such as a raised step or threshold.  Trim any bushes or trees on the path to your home.  Use bright outdoor lighting.  Clear any walking paths of anything that might make someone trip, such as rocks or tools.  Regularly check to see if handrails are loose or broken. Make sure that both sides of any steps have handrails.  Any raised decks and porches should have guardrails on the edges.  Have any leaves, snow, or ice cleared regularly.  Use sand or salt on walking paths during winter.  Clean up any spills in your garage right away. This includes oil or grease spills. What can I do in the bathroom?  Use night lights.  Install grab bars by the toilet and in the tub and shower. Do not use towel bars as grab bars.  Use non-skid mats or decals in the tub or shower.  If you need to sit down in the shower, use a plastic, non-slip stool.  Keep the floor dry. Clean up any water that spills on the floor as soon as it happens.  Remove soap buildup in the tub or shower regularly.  Attach bath mats securely with double-sided non-slip rug tape.  Do not have throw rugs and other things on the floor that can make you trip. What can I do in the bedroom?  Use night lights.  Make sure that you have a light by your bed that is easy to reach.  Do not use any sheets or blankets that are too big for your bed. They should not hang down onto the floor.  Have a firm  chair that has side arms. You can use this for support while you get dressed.  Do not have throw rugs and other things on the floor that can make you trip. What can I do in the kitchen?  Clean up any spills right away.  Avoid walking on wet floors.  Keep items that you use a lot in easy-to-reach places.  If you need to reach something above you, use a strong step stool that has a grab bar.  Keep electrical cords out of the way.  Do not use floor polish or wax that makes floors slippery. If you must use wax, use non-skid floor wax.  Do not have throw rugs and other things on the floor that can make you trip. What can I do with my stairs?  Do not leave any items on the stairs.  Make sure that there are handrails on both sides of the stairs and use them. Fix handrails that are broken or loose. Make sure that handrails are as long as the stairways.  Check any carpeting to make sure that it is  firmly attached to the stairs. Fix any carpet that is loose or worn.  Avoid having throw rugs at the top or bottom of the stairs. If you do have throw rugs, attach them to the floor with carpet tape.  Make sure that you have a light switch at the top of the stairs and the bottom of the stairs. If you do not have them, ask someone to add them for you. What else can I do to help prevent falls?  Wear shoes that:  Do not have high heels.  Have rubber bottoms.  Are comfortable and fit you well.  Are closed at the toe. Do not wear sandals.  If you use a stepladder:  Make sure that it is fully opened. Do not climb a closed stepladder.  Make sure that both sides of the stepladder are locked into place.  Ask someone to hold it for you, if possible.  Clearly mark and make sure that you can see:  Any grab bars or handrails.  First and last steps.  Where the edge of each step is.  Use tools that help you move around (mobility aids) if they are needed. These  include:  Canes.  Walkers.  Scooters.  Crutches.  Turn on the lights when you go into a dark area. Replace any light bulbs as soon as they burn out.  Set up your furniture so you have a clear path. Avoid moving your furniture around.  If any of your floors are uneven, fix them.  If there are any pets around you, be aware of where they are.  Review your medicines with your doctor. Some medicines can make you feel dizzy. This can increase your chance of falling. Ask your doctor what other things that you can do to help prevent falls. This information is not intended to replace advice given to you by your health care provider. Make sure you discuss any questions you have with your health care provider. Document Released: 02/14/2009 Document Revised: 09/26/2015 Document Reviewed: 05/25/2014 Elsevier Interactive Patient Education  2017 [employer].

## 2019-05-23 ENCOUNTER — Other Ambulatory Visit: Payer: Self-pay

## 2019-05-23 ENCOUNTER — Ambulatory Visit (INDEPENDENT_AMBULATORY_CARE_PROVIDER_SITE_OTHER): Payer: PPO | Admitting: Physician Assistant

## 2019-05-23 ENCOUNTER — Encounter: Payer: Self-pay | Admitting: Physician Assistant

## 2019-05-23 VITALS — BP 176/86 | HR 68 | Temp 98.2°F | Wt 198.2 lb

## 2019-05-23 DIAGNOSIS — G8929 Other chronic pain: Secondary | ICD-10-CM

## 2019-05-23 DIAGNOSIS — M542 Cervicalgia: Secondary | ICD-10-CM

## 2019-05-23 MED ORDER — KETOROLAC TROMETHAMINE 60 MG/2ML IM SOLN
60.0000 mg | Freq: Once | INTRAMUSCULAR | Status: AC
  Administered 2019-05-23: 60 mg via INTRAMUSCULAR

## 2019-05-23 NOTE — Progress Notes (Signed)
Patient: Joshua Guerrero Male    DOB: Mar 09, 1956   64 y.o.   MRN: [id number] Visit Date: 05/23/2019  Today's Provider: Trinna Post, PA-C   Chief Complaint  Patient presents with  . Back Pain  . Neck Pain   Subjective:     Back Pain This is a recurrent problem. The current episode started more than 1 year ago. The problem occurs constantly. The problem is unchanged. The quality of the pain is described as aching, burning, cramping, shooting and stabbing. The pain does not radiate. The pain is at a severity of 9/10. The pain is severe. The pain is the same all the time. The symptoms are aggravated by bending, lying down, standing, sitting and twisting. Associated symptoms include tingling. Pertinent negatives include no abdominal pain, headaches, leg pain or weakness. He has tried ice, heat, muscle relaxant and bed rest for the symptoms. The treatment provided no relief.  Neck Pain  This is a recurrent problem. The current episode started more than 1 year ago. The problem occurs constantly. The pain is associated with nothing. The pain is present in the left side and right side. The quality of the pain is described as aching, shooting, cramping, burning and stabbing. The pain is at a severity of 8/10. The pain is severe. The symptoms are aggravated by bending. The pain is same all the time. Stiffness is present all day. Associated symptoms include pain with swallowing and tingling. Pertinent negatives include no headaches, leg pain, trouble swallowing or weakness. He has tried oral narcotics, muscle relaxants, heat, ice and bed rest for the symptoms. The treatment provided no relief.   [place], [place] - Dr. Greta Doom for pain management for chronic back pain. He is to have a nerve ablation but this has not been scheduled yet. He is currently getting fentanyl and hydrocodone by pain management. Has gotten toradol shot before and benefited from this.     Allergies  Allergen Reactions  .  Acetaminophen Shortness Of Breath  . Codeine Itching and Nausea And Vomiting  . Opana [Oxymorphone] Swelling     Current Outpatient Medications:  .  Ascorbic Acid (VITAMIN C PO), Take by mouth. am, Disp: , Rfl:  .  B Complex Vitamins (VITAMIN B COMPLEX PO), Take by mouth. am, Disp: , Rfl:  .  baclofen (LIORESAL) 10 MG tablet, Take 5 mg by mouth 4 (four) times daily. STARTING AT LUNCHTIME, Disp: , Rfl:  .  desvenlafaxine (PRISTIQ) 100 MG 24 hr tablet, TAKE 1 TABLET BY MOUTH DAILY, Disp: 30 tablet, Rfl: 5 .  fentaNYL (DURAGESIC - DOSED MCG/HR) 25 MCG/HR patch, , Disp: , Rfl:  .  hydrochlorothiazide (HYDRODIURIL) 25 MG tablet, Take 1 tablet (25 mg total) by mouth daily., Disp: 90 tablet, Rfl: 3 .  hydrOXYzine (ATARAX/VISTARIL) 50 MG tablet, Take 1 tablet (50 mg total) by mouth 3 (three) times daily as needed (Use as needed for Sleep)., Disp: 30 tablet, Rfl: 5 .  lansoprazole (PREVACID) 30 MG capsule, Take 1 capsule (30 mg total) by mouth 2 (two) times daily before a meal., Disp: 180 capsule, Rfl: 3 .  metoprolol succinate (TOPROL-XL) 50 MG 24 hr tablet, Take 1 tablet (50 mg total) by mouth daily. Take with or immediately following a meal., Disp: 90 tablet, Rfl: 1 .  Multiple Vitamin (MULTIVITAMIN) capsule, Take 1 capsule by mouth daily., Disp: , Rfl:  .  omeprazole (PRILOSEC) 40 MG capsule, TAKE 1 CAPSULE BY MOUTH DAILY, Disp: 30  capsule, Rfl: 5 .  ondansetron (ZOFRAN-ODT) 4 MG disintegrating tablet, Take 1 tablet (4 mg total) by mouth every 8 (eight) hours as needed for nausea or vomiting., Disp: 60 tablet, Rfl: 5 .  oxyCODONE-acetaminophen (PERCOCET) 10-325 MG tablet, Take 1 tablet by mouth every 4 (four) hours. 3AM, 7AM, 11AM, 3 PM, 7PM AND 11PM, Disp: , Rfl:  .  prochlorperazine (COMPAZINE) 10 MG tablet, TAKE 1 TABLET BY MOUTH EVERY 8 HOURS AS NEEDED FOR NAUSEA OR VOMITING, Disp: 90 tablet, Rfl: 0 .  promethazine (PHENERGAN) 25 MG tablet, TAKE 1 TABLET BY MOUTH EVERY 8 HOURS AS NEEDED FOR  NAUSEA OR VOMITING, Disp: 60 tablet, Rfl: 3 .  sucralfate (CARAFATE) 1 g tablet, TAKE 1 TABLET BY MOUTH 4 TIMES A DAY WITH MEALS AND AT BEDTIME., Disp: 120 tablet, Rfl: 11 .  topiramate (TOPAMAX) 50 MG tablet, Take 50 mg by mouth 2 (two) times daily., Disp: , Rfl:   Review of Systems  HENT: Negative for trouble swallowing.   Gastrointestinal: Negative for abdominal pain.  Musculoskeletal: Positive for back pain and neck pain.  Neurological: Positive for tingling. Negative for weakness and headaches.    Social History   Tobacco Use  . Smoking status: Heavy Tobacco Smoker    Packs/day: 1.00    Years: 20.00    Pack years: 20.00    Types: Cigarettes  . Smokeless tobacco: Never Used  . Tobacco comment: currently about 1/2 PPD  Substance Use Topics  . Alcohol use: No      Objective:   BP (!) 176/86 (BP Location: Left Arm, Patient Position: Sitting, Cuff Size: Normal)   Pulse 68   Temp 98.2 F (36.8 C) (Temporal)   Wt 198 lb 3.2 oz (89.9 kg)   BMI 29.27 kg/m  Vitals:   05/23/19 1336  BP: (!) 176/86  Pulse: 68  Temp: 98.2 F (36.8 C)  TempSrc: Temporal  Weight: 198 lb 3.2 oz (89.9 kg)  Body mass index is 29.27 kg/m.   Physical Exam Constitutional:      Appearance: Normal appearance.  Cardiovascular:     Rate and Rhythm: Normal rate.  Pulmonary:     Effort: Pulmonary effort is normal.  Neurological:     Mental Status: He is alert and oriented to person, place, and time. Mental status is at baseline.  Psychiatric:        Mood and Affect: Mood normal.        Behavior: Behavior normal.      No results found for any visits on 05/23/19.     Assessment & Plan    1. Chronic cervical pain  Acute on chronic back pain, treat as below.   - ketorolac (TORADOL) injection 60 mg  The entirety of the information documented in the History of Present Illness, Review of Systems and Physical Exam were personally obtained by me. Portions of this information were initially  documented by [facility] and reviewed by me for thoroughness and accuracy.      Trinna Post, PA-C  [facility]

## 2019-05-23 NOTE — Patient Instructions (Signed)
Acute Back Pain, Adult Acute back pain is sudden and usually short-lived. It is often caused by an injury to the muscles and tissues in the back. The injury may result from:  A muscle or ligament getting overstretched or torn (strained). Ligaments are tissues that connect bones to each other. Lifting something improperly can cause a back strain.  Wear and tear (degeneration) of the spinal disks. Spinal disks are circular tissue that provides cushioning between the bones of the spine (vertebrae).  Twisting motions, such as while playing sports or doing yard work.  A hit to the back.  Arthritis. You may have a physical exam, lab tests, and imaging tests to find the cause of your pain. Acute back pain usually goes away with rest and home care. Follow these instructions at home: Managing pain, stiffness, and swelling  Take over-the-counter and prescription medicines only as told by your health care provider.  Your health care provider may recommend applying ice during the first 24-48 hours after your pain starts. To do this: ? Put ice in a plastic bag. ? Place a towel between your skin and the bag. ? Leave the ice on for 20 minutes, 2-3 times a day.  If directed, apply heat to the affected area as often as told by your health care provider. Use the heat source that your health care provider recommends, such as a moist heat pack or a heating pad. ? Place a towel between your skin and the heat source. ? Leave the heat on for 20-30 minutes. ? Remove the heat if your skin turns bright red. This is especially important if you are unable to feel pain, heat, or cold. You have a greater risk of getting burned. Activity   Do not stay in bed. Staying in bed for more than 1-2 days can delay your recovery.  Sit up and stand up straight. Avoid leaning forward when you sit, or hunching over when you stand. ? If you work at a desk, sit close to it so you do not need to lean over. Keep your chin tucked  in. Keep your neck drawn back, and keep your elbows bent at a right angle. Your arms should look like the letter "L." ? Sit high and close to the steering wheel when you drive. Add lower back (lumbar) support to your car seat, if needed.  Take short walks on even surfaces as soon as you are able. Try to increase the length of time you walk each day.  Do not sit, drive, or stand in one place for more than 30 minutes at a time. Sitting or standing for long periods of time can put stress on your back.  Do not drive or use heavy machinery while taking prescription pain medicine.  Use proper lifting techniques. When you bend and lift, use positions that put less stress on your back: ? Bend your knees. ? Keep the load close to your body. ? Avoid twisting.  Exercise regularly as told by your health care provider. Exercising helps your back heal faster and helps prevent back injuries by keeping muscles strong and flexible.  Work with a physical therapist to make a safe exercise program, as recommended by your health care provider. Do any exercises as told by your physical therapist. Lifestyle  Maintain a healthy weight. Extra weight puts stress on your back and makes it difficult to have good posture.  Avoid activities or situations that make you feel anxious or stressed. Stress and anxiety increase muscle   tension and can make back pain worse. Learn ways to manage anxiety and stress, such as through exercise. General instructions  Sleep on a firm mattress in a comfortable position. Try lying on your side with your knees slightly bent. If you lie on your back, put a pillow under your knees.  Follow your treatment plan as told by your health care provider. This may include: ? Cognitive or behavioral therapy. ? Acupuncture or massage therapy. ? Meditation or yoga. Contact a health care provider if:  You have pain that is not relieved with rest or medicine.  You have increasing pain going down  into your legs or buttocks.  Your pain does not improve after 2 weeks.  You have pain at night.  You lose weight without trying.  You have a fever or chills. Get help right away if:  You develop new bowel or bladder control problems.  You have unusual weakness or numbness in your arms or legs.  You develop nausea or vomiting.  You develop abdominal pain.  You feel faint. Summary  Acute back pain is sudden and usually short-lived.  Use proper lifting techniques. When you bend and lift, use positions that put less stress on your back.  Take over-the-counter and prescription medicines and apply heat or ice as directed by your health care provider. This information is not intended to replace advice given to you by your health care provider. Make sure you discuss any questions you have with your health care provider. Document Revised: 08/09/2018 Document Reviewed: 12/02/2016 Elsevier Patient Education  2020 Elsevier Inc.  

## 2019-05-24 ENCOUNTER — Ambulatory Visit: Payer: Self-pay | Admitting: Family Medicine

## 2019-06-06 DIAGNOSIS — G894 Chronic pain syndrome: Secondary | ICD-10-CM | POA: Diagnosis not present

## 2019-06-06 DIAGNOSIS — M961 Postlaminectomy syndrome, not elsewhere classified: Secondary | ICD-10-CM | POA: Diagnosis not present

## 2019-06-06 DIAGNOSIS — M47812 Spondylosis without myelopathy or radiculopathy, cervical region: Secondary | ICD-10-CM | POA: Diagnosis not present

## 2019-06-06 DIAGNOSIS — M47817 Spondylosis without myelopathy or radiculopathy, lumbosacral region: Secondary | ICD-10-CM | POA: Diagnosis not present

## 2019-06-07 ENCOUNTER — Other Ambulatory Visit: Payer: Self-pay | Admitting: Family Medicine

## 2019-06-07 DIAGNOSIS — F32A Depression, unspecified: Secondary | ICD-10-CM

## 2019-06-07 DIAGNOSIS — F329 Major depressive disorder, single episode, unspecified: Secondary | ICD-10-CM

## 2019-06-07 NOTE — Telephone Encounter (Signed)
Requested Prescriptions  Pending Prescriptions Disp Refills  . desvenlafaxine (PRISTIQ) 100 MG 24 hr tablet [Pharmacy Med Name: DESVENLAFAXINE SUCCINATE ER 100 MG] 30 tablet 5    Sig: TAKE 1 TABLET BY MOUTH ONCE DAILY     Psychiatry: Antidepressants - SNRI - desvenlafaxine & venlafaxine Failed - 06/07/2019 11:06 AM      Failed - LDL in normal range and within 360 days    LDL Calculated  Date Value Ref Range Status  08/25/2017 113 (H) 0 - 99 mg/dL Final         Failed - Total Cholesterol in normal range and within 360 days    Cholesterol, Total  Date Value Ref Range Status  08/25/2017 175 100 - 199 mg/dL Final         Failed - Triglycerides in normal range and within 360 days    Triglycerides  Date Value Ref Range Status  08/25/2017 180 (H) 0 - 149 mg/dL Final         Failed - Last BP in normal range    BP Readings from Last 1 Encounters:  05/23/19 (!) 176/86         Passed - Completed PHQ-2 or PHQ-9 in the last 360 days.      Passed - Valid encounter within last 6 months    Recent Outpatient Visits          2 weeks ago Chronic cervical pain   [facility] Collet [place], [place]   2 months ago Essential hypertension   [facility] Jerrol Banana., [facility]   7 months ago Depression, major, single episode, in partial remission [facility])   [facility] Jerrol Banana., MD   9 months ago Depression, major, single episode, in partial remission [facility])   [facility] Jerrol Banana., MD   9 months ago Fall, initial encounter   [facility] Jerrol Banana., MD

## 2019-06-13 ENCOUNTER — Other Ambulatory Visit: Payer: Self-pay | Admitting: Physician Assistant

## 2019-06-13 DIAGNOSIS — G8929 Other chronic pain: Secondary | ICD-10-CM

## 2019-06-13 DIAGNOSIS — M542 Cervicalgia: Secondary | ICD-10-CM

## 2019-06-13 NOTE — Telephone Encounter (Signed)
Requested medication (s) are due for refill today: Yes  Requested medication (s) are on the active medication list: yes  Last refill: 04/27/2019  #90  0 refills  Future visit scheduled no  Notes to clinic:not delegated Requested Prescriptions  Pending Prescriptions Disp Refills   prochlorperazine (COMPAZINE) 10 MG tablet [Pharmacy Med Name: PROCHLORPERAZINE MALEATE 10 MG TAB] 90 tablet 0    Sig: TAKE 1 TABLET BY MOUTH EVERY 8 HOURS AS NEEDED FOR NAUSEA OR VOMITING      Not Delegated - Gastroenterology: Antiemetics Failed - 06/13/2019  3:31 PM      Failed - This refill cannot be delegated      Passed - Valid encounter within last 6 months    Recent Outpatient Visits           3 weeks ago Chronic cervical pain   [facility] [place] [place], [place]   2 months ago Essential hypertension   [facility] Jerrol Banana., MD   8 months ago Depression, major, single episode, in partial remission [facility])   [facility] Jerrol Banana., MD   9 months ago Depression, major, single episode, in partial remission [facility])   [facility] Jerrol Banana., MD   10 months ago Fall, initial encounter   [facility] Jerrol Banana., MD

## 2019-06-13 NOTE — Telephone Encounter (Signed)
Please advise 

## 2019-06-14 DIAGNOSIS — M47816 Spondylosis without myelopathy or radiculopathy, lumbar region: Secondary | ICD-10-CM | POA: Diagnosis not present

## 2019-06-19 NOTE — Progress Notes (Signed)
Patient: Joshua Guerrero Male    DOB: 06/20/1955   64 y.o.   MRN: [id number] Visit Date: 06/20/2019  Today's Provider: [name], MD   Chief Complaint  Patient presents with  . Back Pain  . Neck Pain   Subjective:    I, Sulibeya S. Dimas, CMA, am acting as a Education administrator for [employer]. Rosanna Randy, MD.   HPI  Patient here today C/O recurrent back and neck pain. Patient reports that he was seen by Dr. Greta Doom and patient had a nerve block done. Patient reports procedure has not had any pain control.  Chronic cervical pain From 05/23/2019-Acute on chronic back pain. Patient was given 60 mg ketorolac (TORADOL) injection.   Allergies  Allergen Reactions  . Acetaminophen Shortness Of Breath  . Codeine Itching and Nausea And Vomiting  . Opana [Oxymorphone] Swelling     Current Outpatient Medications:  .  baclofen (LIORESAL) 10 MG tablet, Take 5 mg by mouth 4 (four) times daily. STARTING AT LUNCHTIME, Disp: , Rfl:  .  desvenlafaxine (PRISTIQ) 100 MG 24 hr tablet, TAKE 1 TABLET BY MOUTH ONCE DAILY, Disp: 30 tablet, Rfl: 5 .  fentaNYL (DURAGESIC - DOSED MCG/HR) 25 MCG/HR patch, , Disp: , Rfl:  .  hydrochlorothiazide (HYDRODIURIL) 25 MG tablet, Take 1 tablet (25 mg total) by mouth daily., Disp: 90 tablet, Rfl: 3 .  hydrOXYzine (ATARAX/VISTARIL) 50 MG tablet, Take 1 tablet (50 mg total) by mouth 3 (three) times daily as needed (Use as needed for Sleep)., Disp: 30 tablet, Rfl: 5 .  lansoprazole (PREVACID) 30 MG capsule, Take 1 capsule (30 mg total) by mouth 2 (two) times daily before a meal., Disp: 180 capsule, Rfl: 3 .  metoprolol succinate (TOPROL-XL) 50 MG 24 hr tablet, Take 1 tablet (50 mg total) by mouth daily. Take with or immediately following a meal., Disp: 90 tablet, Rfl: 1 .  Multiple Vitamin (MULTIVITAMIN) capsule, Take 1 capsule by mouth daily., Disp: , Rfl:  .  omeprazole (PRILOSEC) 40 MG capsule, TAKE 1 CAPSULE BY MOUTH DAILY, Disp: 30 capsule, Rfl: 5 .  ondansetron  (ZOFRAN-ODT) 4 MG disintegrating tablet, Take 1 tablet (4 mg total) by mouth every 8 (eight) hours as needed for nausea or vomiting., Disp: 60 tablet, Rfl: 5 .  oxyCODONE-acetaminophen (PERCOCET) 10-325 MG tablet, Take 1 tablet by mouth every 4 (four) hours. 3AM, 7AM, 11AM, 3 PM, 7PM AND 11PM, Disp: , Rfl:  .  prochlorperazine (COMPAZINE) 10 MG tablet, TAKE 1 TABLET BY MOUTH EVERY 8 HOURS AS NEEDED FOR NAUSEA OR VOMITING, Disp: 90 tablet, Rfl: 0 .  promethazine (PHENERGAN) 25 MG tablet, TAKE 1 TABLET BY MOUTH EVERY 8 HOURS AS NEEDED FOR NAUSEA OR VOMITING, Disp: 60 tablet, Rfl: 3 .  sucralfate (CARAFATE) 1 g tablet, TAKE 1 TABLET BY MOUTH 4 TIMES A DAY WITH MEALS AND AT BEDTIME., Disp: 120 tablet, Rfl: 11 .  topiramate (TOPAMAX) 50 MG tablet, Take 50 mg by mouth 2 (two) times daily., Disp: , Rfl:   Review of Systems  Constitutional: Negative for appetite change, chills and fever.  HENT: Negative.   Eyes: Negative.   Respiratory: Negative for chest tightness, shortness of breath and wheezing.   Cardiovascular: Negative for chest pain and palpitations.  Gastrointestinal: Negative for abdominal pain, nausea and vomiting.  Endocrine: Negative.   Musculoskeletal: Positive for back pain, myalgias, neck pain and neck stiffness.  Allergic/Immunologic: Negative.   Hematological: Negative.   Psychiatric/Behavioral: Negative.  Social History   Tobacco Use  . Smoking status: Heavy Tobacco Smoker    Packs/day: 1.00    Years: 20.00    Pack years: 20.00    Types: Cigarettes  . Smokeless tobacco: Never Used  . Tobacco comment: currently about 1/2 PPD  Substance Use Topics  . Alcohol use: No      Objective:   BP (!) 164/82 (BP Location: Left Arm, Patient Position: Sitting, Cuff Size: Normal)   Pulse 77   Temp (!) 97.5 F (36.4 C) (Temporal)   Resp 16   Ht 5\' 9"  (1.753 m)   Wt 199 lb (90.3 kg)   BMI 29.39 kg/m  Vitals:   06/20/19 1424  BP: (!) 164/82  Pulse: 77  Resp: 16  Temp:  (!) 97.5 F (36.4 C)  TempSrc: Temporal  Weight: 199 lb (90.3 kg)  Height: 5\' 9"  (1.753 m)  Body mass index is 29.39 kg/m.   Physical Exam Vitals reviewed.  Constitutional:      Appearance: He is well-developed.  HENT:     Head: Normocephalic and atraumatic.     Right Ear: External ear normal.     Left Ear: External ear normal.     Nose: Nose normal.  Eyes:     General: No scleral icterus.    Conjunctiva/sclera: Conjunctivae normal.  Neck:     Thyroid: No thyromegaly.  Cardiovascular:     Rate and Rhythm: Normal rate and regular rhythm.     Heart sounds: Normal heart sounds.  Pulmonary:     Effort: Pulmonary effort is normal.     Breath sounds: Normal breath sounds.  Abdominal:     General: There is no distension.     Palpations: Abdomen is soft.     Tenderness: There is no abdominal tenderness.  Lymphadenopathy:     Cervical: No cervical adenopathy.  Skin:    General: Skin is dry.  Neurological:     General: No focal deficit present.     Mental Status: He is alert and oriented to person, place, and time.  Psychiatric:        Mood and Affect: Mood normal.        Behavior: Behavior normal.        Thought Content: Thought content normal.        Judgment: Judgment normal.      No results found for any visits on 06/20/19.     Assessment & Plan    1. Chronic neck and back pain Pt just had a procedure by Dr Iverson Alamin relief per pt. Requests Toradol injection which has helped him in th past. - ketorolac (TORADOL) injection 60 mg  2. Essential hypertension   3. Obstructive sleep apnea syndrome      [name], MD  [facility]

## 2019-06-20 ENCOUNTER — Encounter: Payer: Self-pay | Admitting: Family Medicine

## 2019-06-20 ENCOUNTER — Other Ambulatory Visit: Payer: Self-pay

## 2019-06-20 ENCOUNTER — Ambulatory Visit (INDEPENDENT_AMBULATORY_CARE_PROVIDER_SITE_OTHER): Payer: PPO | Admitting: Family Medicine

## 2019-06-20 VITALS — BP 164/82 | HR 77 | Temp 97.5°F | Resp 16 | Ht 69.0 in | Wt 199.0 lb

## 2019-06-20 DIAGNOSIS — M549 Dorsalgia, unspecified: Secondary | ICD-10-CM

## 2019-06-20 DIAGNOSIS — M542 Cervicalgia: Secondary | ICD-10-CM

## 2019-06-20 DIAGNOSIS — I10 Essential (primary) hypertension: Secondary | ICD-10-CM

## 2019-06-20 DIAGNOSIS — G4733 Obstructive sleep apnea (adult) (pediatric): Secondary | ICD-10-CM | POA: Diagnosis not present

## 2019-06-20 DIAGNOSIS — G8929 Other chronic pain: Secondary | ICD-10-CM

## 2019-06-20 MED ORDER — KETOROLAC TROMETHAMINE 60 MG/2ML IM SOLN
60.0000 mg | Freq: Once | INTRAMUSCULAR | Status: AC
  Administered 2019-06-20: 60 mg via INTRAMUSCULAR

## 2019-07-06 DIAGNOSIS — G894 Chronic pain syndrome: Secondary | ICD-10-CM | POA: Diagnosis not present

## 2019-07-06 DIAGNOSIS — M47812 Spondylosis without myelopathy or radiculopathy, cervical region: Secondary | ICD-10-CM | POA: Diagnosis not present

## 2019-07-06 DIAGNOSIS — M961 Postlaminectomy syndrome, not elsewhere classified: Secondary | ICD-10-CM | POA: Diagnosis not present

## 2019-07-06 DIAGNOSIS — M47817 Spondylosis without myelopathy or radiculopathy, lumbosacral region: Secondary | ICD-10-CM | POA: Diagnosis not present

## 2019-07-10 ENCOUNTER — Other Ambulatory Visit: Payer: Self-pay | Admitting: Family Medicine

## 2019-07-10 DIAGNOSIS — K219 Gastro-esophageal reflux disease without esophagitis: Secondary | ICD-10-CM

## 2019-07-11 ENCOUNTER — Telehealth: Payer: Self-pay

## 2019-07-11 NOTE — Telephone Encounter (Signed)
Spoke with pt regarding recommend MRI to evaluate pancreas due to lesion seen on CT scan. Pt would like to wait another month until we schedule.

## 2019-07-11 NOTE — Telephone Encounter (Signed)
-----   Message from [place]ie [place], [place] sent at 02/23/2019 11:15 AM EDT -----  ----- Message ----- From: [place]ie Isle, CMA Sent: 01/03/2019 To: [place]ie [place], CMA  Pt will need MRI abdomen to re evaluate lesion on pancreas. Pt aware.

## 2019-08-02 DIAGNOSIS — M47817 Spondylosis without myelopathy or radiculopathy, lumbosacral region: Secondary | ICD-10-CM | POA: Diagnosis not present

## 2019-08-02 DIAGNOSIS — G894 Chronic pain syndrome: Secondary | ICD-10-CM | POA: Diagnosis not present

## 2019-08-02 DIAGNOSIS — Z79891 Long term (current) use of opiate analgesic: Secondary | ICD-10-CM | POA: Diagnosis not present

## 2019-08-02 DIAGNOSIS — M47812 Spondylosis without myelopathy or radiculopathy, cervical region: Secondary | ICD-10-CM | POA: Diagnosis not present

## 2019-08-02 DIAGNOSIS — M961 Postlaminectomy syndrome, not elsewhere classified: Secondary | ICD-10-CM | POA: Diagnosis not present

## 2019-08-05 ENCOUNTER — Other Ambulatory Visit: Payer: Self-pay | Admitting: Family Medicine

## 2019-08-05 DIAGNOSIS — G8929 Other chronic pain: Secondary | ICD-10-CM

## 2019-08-05 DIAGNOSIS — M542 Cervicalgia: Secondary | ICD-10-CM

## 2019-08-05 NOTE — Telephone Encounter (Signed)
Requested medication (s) are due for refill today: yes  Requested medication (s) are on the active medication list: yes  Last refill:  06/14/19  Future visit scheduled: yes  Notes to clinic:  medication not delegated to NT to refill   Requested Prescriptions  Pending Prescriptions Disp Refills   prochlorperazine (COMPAZINE) 10 MG tablet [Pharmacy Med Name: PROCHLORPERAZINE MALEATE 10 MG TAB] 90 tablet 0    Sig: TAKE 1 TABLET BY MOUTH EVERY 8 HOURS AS NEEDED FOR NAUSEA OR VOMITING      Not Delegated - Gastroenterology: Antiemetics Failed - 08/05/2019 10:24 AM      Failed - This refill cannot be delegated      Passed - Valid encounter within last 6 months    Recent Outpatient Visits           1 month ago Chronic neck and back pain   [facility] Jerrol Banana., [place]   2 months ago Chronic cervical pain   [facility] Collet [place], [place]   4 months ago Essential hypertension   [facility] Jerrol Banana., MD   9 months ago Depression, major, single episode, in partial remission [facility])   [facility] Jerrol Banana., MD   11 months ago Depression, major, single episode, in partial remission [facility])   [facility] Jerrol Banana., MD       Future Appointments             In 1 month Jerrol Banana., MD [facility]

## 2019-08-30 DIAGNOSIS — M47817 Spondylosis without myelopathy or radiculopathy, lumbosacral region: Secondary | ICD-10-CM | POA: Diagnosis not present

## 2019-08-30 DIAGNOSIS — M961 Postlaminectomy syndrome, not elsewhere classified: Secondary | ICD-10-CM | POA: Diagnosis not present

## 2019-08-30 DIAGNOSIS — G894 Chronic pain syndrome: Secondary | ICD-10-CM | POA: Diagnosis not present

## 2019-08-30 DIAGNOSIS — M47812 Spondylosis without myelopathy or radiculopathy, cervical region: Secondary | ICD-10-CM | POA: Diagnosis not present

## 2019-08-31 ENCOUNTER — Ambulatory Visit: Payer: PPO | Attending: Internal Medicine

## 2019-08-31 ENCOUNTER — Other Ambulatory Visit: Payer: Self-pay

## 2019-08-31 DIAGNOSIS — Z23 Encounter for immunization: Secondary | ICD-10-CM

## 2019-08-31 NOTE — Progress Notes (Signed)
   Covid-19 Vaccination Clinic  Name:  Joshua Guerrero    MRN: [id number] DOB: June 15, 1955  08/31/2019  Mr. Kitagawa was observed post Covid-19 immunization for 15 minutes without incident. He was provided with Vaccine Information Sheet and instruction to access the V-Safe system.   Mr. Scalia was instructed to call 911 with any severe reactions post vaccine: Marland Kitchen Difficulty breathing  . Swelling of face and throat  . A fast heartbeat  . A bad rash all over body  . Dizziness and weakness   Immunizations Administered    Name Date Dose VIS Date Route   Pfizer COVID-19 Vaccine 08/31/2019 11:44 AM 0.3 mL 06/28/2018 Intramuscular   Manufacturer: [employer]   Lot: [id number]   [place]: [id number]

## 2019-09-11 ENCOUNTER — Other Ambulatory Visit: Payer: Self-pay | Admitting: Family Medicine

## 2019-09-11 ENCOUNTER — Telehealth: Payer: Self-pay

## 2019-09-11 DIAGNOSIS — M542 Cervicalgia: Secondary | ICD-10-CM

## 2019-09-11 DIAGNOSIS — M549 Dorsalgia, unspecified: Secondary | ICD-10-CM

## 2019-09-11 DIAGNOSIS — G8929 Other chronic pain: Secondary | ICD-10-CM

## 2019-09-11 NOTE — Telephone Encounter (Signed)
Requested medication (s) are due for refill today:  Yes  Requested medication (s) are on the active medication list:  yes  Future visit scheduled:  yes  Last Refill: 08/07/19; #90; no refills  Note to clinic:  Medication is not delegated.  Requested Prescriptions  Pending Prescriptions Disp Refills   prochlorperazine (COMPAZINE) 10 MG tablet [Pharmacy Med Name: PROCHLORPERAZINE MALEATE 10 MG TAB] 90 tablet 0    Sig: TAKE 1 TABLET BY MOUTH EVERY 8 HOURS AS NEEDED FOR NAUSEA OR VOMITING      Not Delegated - Gastroenterology: Antiemetics Failed - 09/11/2019  2:06 PM      Failed - This refill cannot be delegated      Passed - Valid encounter within last 6 months    Recent Outpatient Visits           2 months ago Chronic neck and back pain   [facility] Jerrol Banana., [place]   3 months ago Chronic cervical pain   [facility] Carles Collet [place], [place]   5 months ago Essential hypertension   [facility] Jerrol Banana., [facility]   11 months ago Depression, major, single episode, in partial remission [facility])   [facility] Jerrol Banana., MD   1 year ago Depression, major, single episode, in partial remission [facility])   [facility] Jerrol Banana., MD       Future Appointments             In 2 days Jerrol Banana., MD [facility], [place]   In 2 weeks Jerrol Banana., MD [facility]

## 2019-09-11 NOTE — Telephone Encounter (Signed)
Patient scheduled an appt

## 2019-09-11 NOTE — Telephone Encounter (Signed)
Copied from [place] [id number]. Topic: General - Other >> Sep 11, 2019 11:47 AM Celene Kras wrote: Reason for CRM: Pt called and is requesting to have a tordal (?) shot. Please advise.

## 2019-09-12 NOTE — Progress Notes (Signed)
Established patient visit   Patient: Joshua Guerrero   DOB: 1955-10-28   64 y.o. Male  MRN: [id number] Visit Date: 09/13/2019  I,Sulibeya S Dimas,acting as a scribe for [name], MD.,have documented all relevant documentation on the behalf of [name], MD,as directed by  [name], MD while in the presence of [name], MD.  Today's healthcare provider: [name], MD   Chief Complaint  Patient presents with  . Back Pain   Subjective    HPI  Back Pain  He reports recurrent back pain. There was an injury that may have caused the pain. Patient reports working in his garden has flared up his back pain. The most recent episode started about a week ago and is staying constant. The pain is located in the lumbarwith radiation, up and down the spine. It is described as aching, burning, pinching, sharp, soreness, stabbing and throbbing, is 8/10 in intensity, occurring constantly. Symptoms are worse in the: same day and night  Aggravating factors: bending backwards, bending forwards, bending sideways, sitting, standing and walking Relieving factors: lying down and rest.  He has tried application of heat and prescription pain relievers with little relief.  There has been no new injury, trauma, fall. He does have a follow-up appointment soon with his pain specialist Associated symptoms: No abdominal pain No bowel incontinence  No chest pain No dysuria  No fever No headaches No joint pains Yes weakness in leg No pelvic pain No tingling in lower extremities No urinary incontinence No weight loss  -----------------------------------------------------------------------------------------   Chronic neck and back pain From 06/20/2019-Pt just had a procedure by Dr Iverson Alamin relief per pt. Given ketorolac (TORADOL) injection 60 mg.  Patient Active Problem List   Diagnosis Date Noted  . Chronic nausea   . Acute peptic ulcer of stomach   .  Stricture and stenosis of esophagus   . Hypokalemia 05/26/2016  . Salicylate overdose [id number]  . Hypogammaglobulinemia ([place]) 12/19/2015  . Benign prostatic hyperplasia 11/11/2015  . Personal history of colonic polyps   . Disease of colon   . Benign neoplasm of descending colon   . Benign neoplasm of sigmoid colon   . Anxiety 09/22/2014  . 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 Back pain, chronic 09/22/2014  . Chronic neck and back pain 09/22/2014  . Clinical depression 09/22/2014  . Acid reflux 09/22/2014  . HLD (hyperlipidemia) 09/22/2014  . BP (high blood pressure) 09/22/2014  . Iron deficiency 09/22/2014  . Apnea, sleep 09/22/2014  . Umbilical hernia without obstruction and without gangrene 09/22/2014   Social History   Tobacco Use  . Smoking status: Heavy Tobacco Smoker    Packs/day: 1.00    Years: 20.00    Pack years: 20.00    Types: Cigarettes  . Smokeless tobacco: Never Used  . Tobacco comment: currently about 1/2 PPD  Substance Use Topics  . Alcohol use: No  . Drug use: No       Medications: Outpatient Medications Prior to Visit  Medication Sig  . baclofen (LIORESAL) 10 MG tablet Take 5 mg by mouth 4 (four) times daily. STARTING AT LUNCHTIME  . desvenlafaxine (PRISTIQ) 100 MG 24 hr tablet TAKE 1 TABLET BY MOUTH ONCE DAILY  . fentaNYL (DURAGESIC - DOSED MCG/HR) 25 MCG/HR patch   . hydrochlorothiazide (HYDRODIURIL) 25 MG tablet Take 1 tablet (25 mg total) by mouth daily.  . hydrOXYzine (ATARAX/VISTARIL) 50 MG tablet Take 1 tablet (50 mg  total) by mouth 3 (three) times daily as needed (Use as needed for Sleep).  . lansoprazole (PREVACID) 30 MG capsule Take 1 capsule (30 mg total) by mouth 2 (two) times daily before a meal.  . metoprolol succinate (TOPROL-XL) 50 MG 24 hr tablet Take 1 tablet (50 mg total) by mouth daily. Take with or immediately following a meal.  . omeprazole (PRILOSEC) 40 MG capsule TAKE 1 CAPSULE BY MOUTH ONCE DAILY.  Marland Kitchen ondansetron  (ZOFRAN-ODT) 4 MG disintegrating tablet Take 1 tablet (4 mg total) by mouth every 8 (eight) hours as needed for nausea or vomiting.  Marland Kitchen oxyCODONE-acetaminophen (PERCOCET) 10-325 MG tablet Take 1 tablet by mouth every 4 (four) hours. 3AM, 7AM, 11AM, 3 PM, 7PM AND 11PM  . prochlorperazine (COMPAZINE) 10 MG tablet TAKE 1 TABLET BY MOUTH EVERY 8 HOURS AS NEEDED FOR NAUSEA OR VOMITING  . promethazine (PHENERGAN) 25 MG tablet TAKE 1 TABLET BY MOUTH EVERY 8 HOURS AS NEEDED FOR NAUSEA OR VOMITING  . sucralfate (CARAFATE) 1 g tablet TAKE 1 TABLET BY MOUTH 4 TIMES A DAY WITH MEALS AND AT BEDTIME.  Marland Kitchen topiramate (TOPAMAX) 50 MG tablet Take 50 mg by mouth 2 (two) times daily.  . [DISCONTINUED] Multiple Vitamin (MULTIVITAMIN) capsule Take 1 capsule by mouth daily.   No facility-administered medications prior to visit.    Review of Systems  Constitutional: Negative for appetite change, chills and fever.  HENT: Negative.   Eyes: Negative.   Respiratory: Negative for chest tightness, shortness of breath and wheezing.   Cardiovascular: Negative for chest pain and palpitations.  Gastrointestinal: Negative for abdominal pain, nausea and vomiting.  Endocrine: Negative.   Musculoskeletal: Positive for back pain, myalgias and neck pain.  Allergic/Immunologic: Negative.   Psychiatric/Behavioral: Negative.      Objective    BP 115/76 (BP Location: Right Arm, Patient Position: Sitting, Cuff Size: Large)   Pulse 76   Temp (!) 96.8 F (36 C) (Temporal)   Resp 16   Wt 206 lb (93.4 kg)   BMI 30.42 kg/m  BP Readings from Last 3 Encounters:  09/13/19 115/76  06/20/19 (!) 164/82  05/23/19 (!) 176/86   Wt Readings from Last 3 Encounters:  09/13/19 206 lb (93.4 kg)  06/20/19 199 lb (90.3 kg)  05/23/19 198 lb 3.2 oz (89.9 kg)      Physical Exam Constitutional:      Appearance: Normal appearance.  Eyes:     General: No scleral icterus. Neck:     Comments: He has mild tenderness along all the paraspinal  muscles.  No direct tenderness over the spine itself. Cardiovascular:     Rate and Rhythm: Normal rate.  Pulmonary:     Effort: Pulmonary effort is normal.  Musculoskeletal:     Cervical back: Tenderness present.     Thoracic back: Tenderness present.     Lumbar back: Tenderness present.  Neurological:     Mental Status: He is alert and oriented to person, place, and time. Mental status is at baseline.  Psychiatric:        Mood and Affect: Mood normal.        Behavior: Behavior normal.        Thought Content: Thought content normal.     No results found for any visits on 09/13/19.  Assessment & Plan     Problem List Items Addressed This Visit      Cardiovascular and Mediastinum   BP (high blood pressure)   Relevant Orders   Comprehensive metabolic  panel     Other   Chronic neck and back pain - Primary    Recurrent Worse in the last few days Continue current oral medications Keep follow up with Dr. Greta Doom Ketorolac 60mg  given today Patient tolerated injection well       Relevant Orders   CBC with Differential/Platelet   Iron deficiency   Relevant Orders   CBC with Differential/Platelet    Other Visit Diagnoses    Depression, major, single episode, in partial remission ([place])       Relevant Orders   TSH       Return for as scheduled.      I, [name], MD, have reviewed all documentation for this visit. The documentation on 09/14/19 for the exam, diagnosis, procedures, and orders are all accurate and complete.    Alexxia Stankiewicz Cranford Mon, MD  [facility] [phone] (phone) [phone] (fax)  [place]

## 2019-09-13 ENCOUNTER — Ambulatory Visit (INDEPENDENT_AMBULATORY_CARE_PROVIDER_SITE_OTHER): Payer: PPO | Admitting: Family Medicine

## 2019-09-13 ENCOUNTER — Encounter: Payer: Self-pay | Admitting: Family Medicine

## 2019-09-13 ENCOUNTER — Other Ambulatory Visit: Payer: Self-pay

## 2019-09-13 VITALS — BP 115/76 | HR 76 | Temp 96.8°F | Resp 16 | Wt 206.0 lb

## 2019-09-13 DIAGNOSIS — M549 Dorsalgia, unspecified: Secondary | ICD-10-CM | POA: Diagnosis not present

## 2019-09-13 DIAGNOSIS — F324 Major depressive disorder, single episode, in partial remission: Secondary | ICD-10-CM

## 2019-09-13 DIAGNOSIS — E611 Iron deficiency: Secondary | ICD-10-CM

## 2019-09-13 DIAGNOSIS — G8929 Other chronic pain: Secondary | ICD-10-CM | POA: Diagnosis not present

## 2019-09-13 DIAGNOSIS — I10 Essential (primary) hypertension: Secondary | ICD-10-CM | POA: Diagnosis not present

## 2019-09-13 DIAGNOSIS — M542 Cervicalgia: Secondary | ICD-10-CM

## 2019-09-13 MED ORDER — KETOROLAC TROMETHAMINE 60 MG/2ML IM SOLN
60.0000 mg | Freq: Once | INTRAMUSCULAR | Status: AC
  Administered 2019-09-13: 60 mg via INTRAMUSCULAR

## 2019-09-13 NOTE — Assessment & Plan Note (Signed)
Recurrent Worse in the last few days Continue current oral medications Keep follow up with Dr. Greta Doom Ketorolac 60mg  given today Patient tolerated injection well

## 2019-09-18 NOTE — Progress Notes (Deleted)
     Established patient visit   Patient: Joshua Guerrero   DOB: [birth year]   64 y.o. Male  MRN: [id number] Visit Date: 09/25/2019  Today's healthcare provider: [name], MD   No chief complaint on file.  Subjective    HPI ***  {Show patient history (optional):23778::" "}   Medications: Outpatient Medications Prior to Visit  Medication Sig  . baclofen (LIORESAL) 10 MG tablet Take 5 mg by mouth 4 (four) times daily. STARTING AT LUNCHTIME  . desvenlafaxine (PRISTIQ) 100 MG 24 hr tablet TAKE 1 TABLET BY MOUTH ONCE DAILY  . fentaNYL (DURAGESIC - DOSED MCG/HR) 25 MCG/HR patch   . hydrochlorothiazide (HYDRODIURIL) 25 MG tablet Take 1 tablet (25 mg total) by mouth daily.  . hydrOXYzine (ATARAX/VISTARIL) 50 MG tablet Take 1 tablet (50 mg total) by mouth 3 (three) times daily as needed (Use as needed for Sleep).  . lansoprazole (PREVACID) 30 MG capsule Take 1 capsule (30 mg total) by mouth 2 (two) times daily before a meal.  . metoprolol succinate (TOPROL-XL) 50 MG 24 hr tablet Take 1 tablet (50 mg total) by mouth daily. Take with or immediately following a meal.  . omeprazole (PRILOSEC) 40 MG capsule TAKE 1 CAPSULE BY MOUTH ONCE DAILY.  Marland Kitchen ondansetron (ZOFRAN-ODT) 4 MG disintegrating tablet Take 1 tablet (4 mg total) by mouth every 8 (eight) hours as needed for nausea or vomiting.  Marland Kitchen oxyCODONE-acetaminophen (PERCOCET) 10-325 MG tablet Take 1 tablet by mouth every 4 (four) hours. 3AM, 7AM, 11AM, 3 PM, 7PM AND 11PM  . prochlorperazine (COMPAZINE) 10 MG tablet TAKE 1 TABLET BY MOUTH EVERY 8 HOURS AS NEEDED FOR NAUSEA OR VOMITING  . promethazine (PHENERGAN) 25 MG tablet TAKE 1 TABLET BY MOUTH EVERY 8 HOURS AS NEEDED FOR NAUSEA OR VOMITING  . sucralfate (CARAFATE) 1 g tablet TAKE 1 TABLET BY MOUTH 4 TIMES A DAY WITH MEALS AND AT BEDTIME.  Marland Kitchen topiramate (TOPAMAX) 50 MG tablet Take 50 mg by mouth 2 (two) times daily.   No facility-administered medications prior to visit.    Review of  Systems  Constitutional: Negative for appetite change, chills and fever.  Respiratory: Negative for chest tightness, shortness of breath and wheezing.   Cardiovascular: Negative for chest pain and palpitations.  Gastrointestinal: Negative for abdominal pain, nausea and vomiting.    {CBC  CMET  Lipids  Results Review (optional):23779::" "}  Objective    There were no vitals taken for this visit. {Show previous vital signs (optional):23777::" "}  Physical Exam  ***  No results found for any visits on 09/25/19.  Assessment & Plan     ***  No follow-ups on file.      {provider attestation***:1}   [name], MD  [facility] [phone] (phone) [phone] (fax)  [place]

## 2019-09-21 ENCOUNTER — Other Ambulatory Visit: Payer: Self-pay | Admitting: Family Medicine

## 2019-09-21 DIAGNOSIS — G4733 Obstructive sleep apnea (adult) (pediatric): Secondary | ICD-10-CM

## 2019-09-22 ENCOUNTER — Other Ambulatory Visit: Payer: Self-pay | Admitting: Family Medicine

## 2019-09-22 DIAGNOSIS — I10 Essential (primary) hypertension: Secondary | ICD-10-CM

## 2019-09-22 NOTE — Telephone Encounter (Signed)
Requested Prescriptions  Pending Prescriptions Disp Refills  . metoprolol succinate (TOPROL-XL) 50 MG 24 hr tablet [Pharmacy Med Name: METOPROLOL SUCCINATE ER 50 MG TAB] 90 tablet 1    Sig: TAKE 1 TABLET BY MOUTH DAILY WITH OR IMMEDIATELY FOLLOWING A MEAL     Cardiovascular:  Beta Blockers Passed - 09/22/2019 10:06 AM      Passed - Last BP in normal range    BP Readings from Last 1 Encounters:  09/13/19 115/76         Passed - Last Heart Rate in normal range    Pulse Readings from Last 1 Encounters:  09/13/19 76         Passed - Valid encounter within last 6 months    Recent Outpatient Visits          1 week ago Chronic neck and back pain   [facility] Jerrol Banana., [facility]   3 months ago Chronic neck and back pain   [facility] Jerrol Banana., [facility]   4 months ago Chronic cervical pain   [facility] Carles Collet [place], [place]   6 months ago Essential hypertension   [facility] Jerrol Banana., MD   11 months ago Depression, major, single episode, in partial remission [facility])   [facility] Jerrol Banana., MD

## 2019-09-25 ENCOUNTER — Telehealth: Payer: Self-pay

## 2019-09-25 ENCOUNTER — Ambulatory Visit: Payer: Self-pay | Admitting: Family Medicine

## 2019-09-25 NOTE — Telephone Encounter (Signed)
Followed back up with pt regarding the need for an MRI abdomen to evaluate the lesion on his pancreas. Pt stated he is not feeling well right now. He has done something to his back and would like to wait a few months. I will put a recall in to contact him.

## 2019-09-25 NOTE — Telephone Encounter (Signed)
-----   Message from Glennie Isle, [place] sent at 07/11/2019 10:56 AM EST ----- Pt requested to wait another month.  ----- Message ----- From: Glennie Isle, CMA Sent: 05/08/2019 To: Glennie Isle, CMA   ----- Message ----- From: [place]ie [place], CMA Sent: 01/03/2019 To: Ginger Feldpausch, CMA  Pt will need MRI abdomen to re evaluate lesion on pancreas. Pt aware.

## 2019-09-26 ENCOUNTER — Ambulatory Visit: Payer: PPO

## 2019-09-27 DIAGNOSIS — G894 Chronic pain syndrome: Secondary | ICD-10-CM | POA: Diagnosis not present

## 2019-09-27 DIAGNOSIS — M47812 Spondylosis without myelopathy or radiculopathy, cervical region: Secondary | ICD-10-CM | POA: Diagnosis not present

## 2019-09-27 DIAGNOSIS — M47817 Spondylosis without myelopathy or radiculopathy, lumbosacral region: Secondary | ICD-10-CM | POA: Diagnosis not present

## 2019-09-27 DIAGNOSIS — M961 Postlaminectomy syndrome, not elsewhere classified: Secondary | ICD-10-CM | POA: Diagnosis not present

## 2019-09-28 ENCOUNTER — Ambulatory Visit: Payer: PPO | Attending: Internal Medicine

## 2019-09-28 DIAGNOSIS — Z23 Encounter for immunization: Secondary | ICD-10-CM

## 2019-09-28 NOTE — Progress Notes (Signed)
   Covid-19 Vaccination Clinic  Name:  Joshua Guerrero    MRN: [id number] DOB: June 18, 1955  09/28/2019  [name] was observed post Covid-19 immunization for 15 minutes without incident. He was provided with Vaccine Information Sheet and instruction to access the V-Safe system.   [name] was instructed to call 911 with any severe reactions post vaccine: Marland Kitchen Difficulty breathing  . Swelling of face and throat  . A fast heartbeat  . A bad rash all over body  . Dizziness and weakness   Immunizations Administered    Name Date Dose VIS Date Route   Pfizer COVID-19 Vaccine 09/28/2019 11:45 AM 0.3 mL 06/28/2018 Intramuscular   Manufacturer: [employer]   Lot: [id number]   [place]: [id number]

## 2019-10-03 ENCOUNTER — Other Ambulatory Visit: Payer: Self-pay | Admitting: Family Medicine

## 2019-10-03 DIAGNOSIS — R12 Heartburn: Secondary | ICD-10-CM

## 2019-10-03 DIAGNOSIS — K219 Gastro-esophageal reflux disease without esophagitis: Secondary | ICD-10-CM

## 2019-10-23 DIAGNOSIS — M961 Postlaminectomy syndrome, not elsewhere classified: Secondary | ICD-10-CM | POA: Diagnosis not present

## 2019-10-23 DIAGNOSIS — M47817 Spondylosis without myelopathy or radiculopathy, lumbosacral region: Secondary | ICD-10-CM | POA: Diagnosis not present

## 2019-10-23 DIAGNOSIS — M47812 Spondylosis without myelopathy or radiculopathy, cervical region: Secondary | ICD-10-CM | POA: Diagnosis not present

## 2019-10-23 DIAGNOSIS — G894 Chronic pain syndrome: Secondary | ICD-10-CM | POA: Diagnosis not present

## 2019-10-26 DIAGNOSIS — M47816 Spondylosis without myelopathy or radiculopathy, lumbar region: Secondary | ICD-10-CM | POA: Diagnosis not present

## 2019-10-27 ENCOUNTER — Other Ambulatory Visit: Payer: Self-pay | Admitting: Family Medicine

## 2019-10-27 DIAGNOSIS — G4733 Obstructive sleep apnea (adult) (pediatric): Secondary | ICD-10-CM

## 2019-10-30 ENCOUNTER — Other Ambulatory Visit: Payer: Self-pay | Admitting: Family Medicine

## 2019-10-30 DIAGNOSIS — G8929 Other chronic pain: Secondary | ICD-10-CM

## 2019-10-30 DIAGNOSIS — M542 Cervicalgia: Secondary | ICD-10-CM

## 2019-10-30 NOTE — Telephone Encounter (Signed)
Please advise refill? 

## 2019-10-30 NOTE — Telephone Encounter (Signed)
Requested medication (s) are due for refill today: yes  Requested medication (s) are on the active medication list: yes  Last refill:  09/12/2019  Future visit scheduled: no  Notes to clinic:  this refill cannot be delegated    Requested Prescriptions  Pending Prescriptions Disp Refills   prochlorperazine (COMPAZINE) 10 MG tablet [Pharmacy Med Name: PROCHLORPERAZINE MALEATE 10 MG TAB] 90 tablet 0    Sig: TAKE 1 TABLET BY MOUTH EVERY 8 HOURS AS NEEDED FOR NAUSEA OR VOMITING      Not Delegated - Gastroenterology: Antiemetics Failed - 10/30/2019 12:07 PM      Failed - This refill cannot be delegated      Passed - Valid encounter within last 6 months    Recent Outpatient Visits           1 month ago Chronic neck and back pain   [facility] Jerrol Banana., [facility]   4 months ago Chronic neck and back pain   [facility] Jerrol Banana., [facility]   5 months ago Chronic cervical pain   [facility] Carles Collet [place], [place]   7 months ago Essential hypertension   [facility] Jerrol Banana., MD   1 year ago Depression, major, single episode, in partial remission [facility])   [facility] Jerrol Banana., MD

## 2019-11-10 ENCOUNTER — Other Ambulatory Visit: Payer: Self-pay | Admitting: Family Medicine

## 2019-11-10 DIAGNOSIS — I10 Essential (primary) hypertension: Secondary | ICD-10-CM

## 2019-11-10 NOTE — Telephone Encounter (Signed)
Requested medication (s) are due for refill today - yes  Requested medication (s) are on the active medication list -yes  Future visit scheduled -no  Last refill: 08/08/19  Notes to clinic: Patient labs have been ordered- but patient has not follow up- fails lab protocol.  Requested Prescriptions  Pending Prescriptions Disp Refills   hydrochlorothiazide (HYDRODIURIL) 25 MG tablet [Pharmacy Med Name: HYDROCHLOROTHIAZIDE 25 MG TAB] 90 tablet 3    Sig: TAKE 1 TABLET BY MOUTH DAILY      Cardiovascular: Diuretics - Thiazide Failed - 11/10/2019  1:58 PM      Failed - Ca in normal range and within 360 days    Calcium  Date Value Ref Range Status  05/16/2018 8.5 (L) 8.6 - 10.2 mg/dL Final   Calcium, Total  Date Value Ref Range Status  12/12/2011 8.8 8.5 - 10.1 mg/dL Final          Failed - Cr in normal range and within 360 days    Creatinine  Date Value Ref Range Status  12/12/2011 1.09 0.60 - 1.30 mg/dL Final   Creatinine, Ser  Date Value Ref Range Status  07/18/2018 0.70 0.61 - 1.24 mg/dL Final          Failed - K in normal range and within 360 days    Potassium  Date Value Ref Range Status  05/16/2018 3.9 3.5 - 5.2 mmol/L Final  12/12/2011 3.0 (L) 3.5 - 5.1 mmol/L Final          Failed - Na in normal range and within 360 days    Sodium  Date Value Ref Range Status  05/16/2018 143 134 - 144 mmol/L Final  12/12/2011 135 (L) 136 - 145 mmol/L Final          Passed - Last BP in normal range    BP Readings from Last 1 Encounters:  09/13/19 115/76          Passed - Valid encounter within last 6 months    Recent Outpatient Visits           1 month ago Chronic neck and back pain   [facility] Jerrol [facility]   4 months ago Chronic neck and back pain   [facility] Jerrol Banana., [facility]   5 months ago Chronic cervical pain   [facility] [place]les [place], [place]   7 months ago Essential hypertension    [facility] Jerrol Banana., MD   1 year ago Depression, major, single episode, in partial remission [facility])   [facility] Jerrol Banana., MD                  Requested Prescriptions  Pending Prescriptions Disp Refills   hydrochlorothiazide (HYDRODIURIL) 25 MG tablet [Pharmacy Med Name: HYDROCHLOROTHIAZIDE 25 MG TAB] 90 tablet 3    Sig: TAKE 1 TABLET BY MOUTH DAILY      Cardiovascular: Diuretics - Thiazide Failed - 11/10/2019  1:58 PM      Failed - Ca in normal range and within 360 days    Calcium  Date Value Ref Range Status  05/16/2018 8.5 (L) 8.6 - 10.2 mg/dL Final   Calcium, Total  Date Value Ref Range Status  12/12/2011 8.8 8.5 - 10.1 mg/dL Final          Failed - Cr in normal range and within 360 days    Creatinine  Date Value Ref Range  Status  12/12/2011 1.09 0.60 - 1.30 mg/dL Final   Creatinine, Ser  Date Value Ref Range Status  07/18/2018 0.70 0.61 - 1.24 mg/dL Final          Failed - K in normal range and within 360 days    Potassium  Date Value Ref Range Status  05/16/2018 3.9 3.5 - 5.2 mmol/L Final  12/12/2011 3.0 (L) 3.5 - 5.1 mmol/L Final          Failed - Na in normal range and within 360 days    Sodium  Date Value Ref Range Status  05/16/2018 143 134 - 144 mmol/L Final  12/12/2011 135 (L) 136 - 145 mmol/L Final          Passed - Last BP in normal range    BP Readings from Last 1 Encounters:  09/13/19 115/76          Passed - Valid encounter within last 6 months    Recent Outpatient Visits           1 month ago Chronic neck and back pain   [facility] Jerrol Banana., [facility]   4 months ago Chronic neck and back pain   [facility] Jerrol Banana., [facility]   5 months ago Chronic cervical pain   [facility] Carles Collet [place], [place]   7 months ago Essential hypertension   [facility] Jerrol Banana., MD   1 year ago  Depression, major, single episode, in partial remission [facility])   [facility] Jerrol Banana., MD

## 2019-11-24 DIAGNOSIS — M47817 Spondylosis without myelopathy or radiculopathy, lumbosacral region: Secondary | ICD-10-CM | POA: Diagnosis not present

## 2019-11-24 DIAGNOSIS — M961 Postlaminectomy syndrome, not elsewhere classified: Secondary | ICD-10-CM | POA: Diagnosis not present

## 2019-11-24 DIAGNOSIS — M47812 Spondylosis without myelopathy or radiculopathy, cervical region: Secondary | ICD-10-CM | POA: Diagnosis not present

## 2019-11-24 DIAGNOSIS — G894 Chronic pain syndrome: Secondary | ICD-10-CM | POA: Diagnosis not present

## 2019-11-29 ENCOUNTER — Other Ambulatory Visit: Payer: Self-pay | Admitting: Family Medicine

## 2019-11-29 DIAGNOSIS — G8929 Other chronic pain: Secondary | ICD-10-CM

## 2019-11-29 DIAGNOSIS — M542 Cervicalgia: Secondary | ICD-10-CM

## 2019-11-29 NOTE — Telephone Encounter (Signed)
Requested medication (s) are due for refill today: yes  Requested medication (s) are on the active medication list:yes  Last refill:  10/30/19 #90  0 refills  Future visit scheduled: No  Notes to clinic: Med not delegated    Requested Prescriptions  Pending Prescriptions Disp Refills   prochlorperazine (COMPAZINE) 10 MG tablet [Pharmacy Med Name: PROCHLORPERAZINE MALEATE 10 MG TAB] 90 tablet 0    Sig: TAKE 1 TABLET BY MOUTH EVERY 8 HOURS AS NEEDED FOR NAUSEA OR VOMITING      Not Delegated - Gastroenterology: Antiemetics Failed - 11/29/2019 12:13 PM      Failed - This refill cannot be delegated      Passed - Valid encounter within last 6 months    Recent Outpatient Visits           2 months ago Chronic neck and back pain   [facility] Jerrol [facility]., [facility]   5 months ago Chronic neck and back pain   [facility] Jerrol Banana., [facility]   6 months ago Chronic cervical pain   [facility] Carles Collet [place], [place]   8 months ago Essential hypertension   [facility] Jerrol Banana., MD   1 year ago Depression, major, single episode, in partial remission [facility])   [facility] Jerrol Banana., MD

## 2019-12-05 ENCOUNTER — Ambulatory Visit: Payer: PPO | Admitting: Family Medicine

## 2019-12-07 NOTE — Progress Notes (Signed)
I,[name],acting as a scribe for [name], MD.,have documented all relevant documentation on the behalf of [name], MD,as directed by  [name], MD while in the presence of [name], MD.    Established patient visit   Patient: Joshua Guerrero   DOB: 01-31-1956   64 y.o. Male  MRN: [id number] Visit Date: 12/11/2019  Today's healthcare provider: [name], MD   Chief Complaint  Patient presents with  . Back Pain  . Follow-up  . Hypertension   Subjective    HPI  Patient does request refill on Phenergan as he has chronic nausea when he takes the narcotics provided by the pain clinic. Hypertension, follow-up  BP Readings from Last 3 Encounters:  12/11/19 (!) 152/83  09/13/19 115/76  06/20/19 (!) 164/82   Wt Readings from Last 3 Encounters:  12/11/19 191 lb (86.6 kg)  09/13/19 206 lb (93.4 kg)  06/20/19 199 lb (90.3 kg)     He was last seen for hypertension 3 months ago.  BP at that visit was 115/76. Management since that visit includes; CONTROLLED. He reports good compliance with treatment. He is not having side effects. none He is not exercising. He is adherent to low salt diet.   Outside blood pressures are normal.  He does smoke.  Use of agents associated with hypertension: amphetamines.   --------------------------------------------------------------------  Chronic Neck and Back Pain From 09/13/2019-Recurrent. Continue current oral medications. Keep follow up with Dr. Greta Doom. Ketorolac 60mg  given today.      Medications: Outpatient Medications Prior to Visit  Medication Sig  . baclofen (LIORESAL) 10 MG tablet Take 5 mg by mouth 4 (four) times daily. STARTING AT LUNCHTIME  . desvenlafaxine (PRISTIQ) 100 MG 24 hr tablet TAKE 1 TABLET BY MOUTH ONCE DAILY  . fentaNYL (DURAGESIC - DOSED MCG/HR) 25 MCG/HR patch   . hydrochlorothiazide (HYDRODIURIL) 25 MG tablet TAKE 1 TABLET BY MOUTH DAILY  . hydrOXYzine  (ATARAX/VISTARIL) 50 MG tablet TAKE 1 TABLET BY MOUTH 3 TIMES DAILY AS NEEDED (USE AS NEEDED FOR SLEEP)  . lansoprazole (PREVACID) 30 MG capsule TAKE ONE CAPSULE BY MOUTH TWICE A DAY BEFORE A MEAL  . metoprolol succinate (TOPROL-XL) 50 MG 24 hr tablet TAKE 1 TABLET BY MOUTH DAILY WITH OR IMMEDIATELY FOLLOWING A MEAL  . oxyCODONE-acetaminophen (PERCOCET) 10-325 MG tablet Take 1 tablet by mouth every 4 (four) hours. 3AM, 7AM, 11AM, 3 PM, 7PM AND 11PM  . prochlorperazine (COMPAZINE) 10 MG tablet TAKE 1 TABLET BY MOUTH EVERY 8 HOURS AS NEEDED FOR NAUSEA OR VOMITING  . sucralfate (CARAFATE) 1 g tablet TAKE 1 TABLET BY MOUTH 4 TIMES A DAY WITH MEALS AND AT BEDTIME  . topiramate (TOPAMAX) 50 MG tablet Take 50 mg by mouth 2 (two) times daily.  . [DISCONTINUED] promethazine (PHENERGAN) 25 MG tablet TAKE 1 TABLET BY MOUTH EVERY 8 HOURS AS NEEDED FOR NAUSEA OR VOMITING  . omeprazole (PRILOSEC) 40 MG capsule TAKE 1 CAPSULE BY MOUTH ONCE DAILY. (Patient not taking: Reported on 12/11/2019)  . ondansetron (ZOFRAN-ODT) 4 MG disintegrating tablet Take 1 tablet (4 mg total) by mouth every 8 (eight) hours as needed for nausea or vomiting. (Patient not taking: Reported on 12/11/2019)   No facility-administered medications prior to visit.    Review of Systems  Constitutional: Negative for appetite change, chills and fever.  Respiratory: Negative for chest tightness, shortness of breath and wheezing.   Cardiovascular: Negative for chest pain and palpitations.  Gastrointestinal: Negative for  abdominal pain, nausea and vomiting.       Objective    BP (!) 152/83 (BP Location: Right Arm, Patient Position: Sitting, Cuff Size: Large)   Pulse 65   Temp 99.4 F (37.4 C) (Other (Comment))   Resp 18   Ht 5\' 9"  (1.753 m)   Wt 191 lb (86.6 kg)   SpO2 98%   BMI 28.21 kg/m  BP Readings from Last 3 Encounters:  12/11/19 (!) 152/83  09/13/19 115/76  06/20/19 (!) 164/82   Wt Readings from Last 3 Encounters:  12/11/19  191 lb (86.6 kg)  09/13/19 206 lb (93.4 kg)  06/20/19 199 lb (90.3 kg)      Physical Exam  BP (!) 152/83 (BP Location: Right Arm, Patient Position: Sitting, Cuff Size: Large)   Pulse 65   Temp 99.4 F (37.4 C) (Other (Comment))   Resp 18   Ht 5\' 9"  (1.753 m)   Wt 191 lb (86.6 kg)   SpO2 98%   BMI 28.21 kg/m   General Appearance:    Alert, cooperative, no distress, appears stated age  Head:    Normocephalic, without obvious abnormality, atraumatic  Eyes:    PERRL, conjunctiva/corneas clear, EOM's intact, fundi    benign, both eyes       Ears:    Normal TM's and external ear canals, both ears  Nose:   Nares normal, septum midline, mucosa normal, no drainage   or sinus tenderness  Throat:   Lips, mucosa, and tongue normal; teeth and gums normal  Neck:   Supple, symmetrical, trachea midline, no adenopathy;       thyroid:  No enlargement/tenderness/nodules; no carotid   bruit or JVD  Back:     Symmetric, no curvature, ROM normal, no CVA tenderness  Lungs:     Clear to auscultation bilaterally, respirations unlabored  Chest wall:    No tenderness or deformity  Heart:    Regular rate and rhythm, S1 and S2 normal, no murmur, rub   or gallop  Abdomen:     Soft, non-tender, bowel sounds active all four quadrants,    no masses, no organomegaly  Genitalia:    Normal male without lesion, discharge or tenderness  Rectal:    Normal tone, normal prostate, no masses or tenderness;   guaiac negative stool  Extremities:   Extremities normal, atraumatic, no cyanosis or edema  Pulses:   2+ and symmetric all extremities  Skin:   Skin color, texture, turgor normal, no rashes or lesions  Lymph nodes:   Cervical, supraclavicular, and axillary nodes normal  Neurologic:   CNII-XII intact. Normal strength, sensation and reflexes      throughout     No results found for any visits on 12/11/19.  Assessment & Plan     1. Essential hypertension  - Lipid panel - TSH - CBC w/Diff/Platelet -  Comprehensive Metabolic Panel (CMET)  2. Other hyperlipidemia  - Lipid panel - TSH - CBC w/Diff/Platelet - Comprehensive Metabolic Panel (CMET)  3. Chronic nausea  - promethazine (PHENERGAN) 25 MG tablet; Take 1 tablet (25 mg total) by mouth every 8 (eight) hours as needed for nausea or vomiting.  Dispense: 60 tablet; Refill: 3  4. Chronic neck and back pain  - ketorolac (TORADOL) injection 60 mg 5.  Depression Continue Pristiq 6.  Tobacco abuse Discussed screening chest CT on next visit and cessation.  Return in about 3 months (around 03/12/2020).         Chardae Mulkern Cranford Mon,  MD  [facility] [phone] (phone) [phone] (fax)  [place]

## 2019-12-11 ENCOUNTER — Ambulatory Visit (INDEPENDENT_AMBULATORY_CARE_PROVIDER_SITE_OTHER): Payer: PPO | Admitting: Family Medicine

## 2019-12-11 ENCOUNTER — Other Ambulatory Visit: Payer: Self-pay

## 2019-12-11 ENCOUNTER — Encounter: Payer: Self-pay | Admitting: Family Medicine

## 2019-12-11 VITALS — BP 152/83 | HR 65 | Temp 99.4°F | Resp 18 | Ht 69.0 in | Wt 191.0 lb

## 2019-12-11 DIAGNOSIS — R110 Nausea: Secondary | ICD-10-CM

## 2019-12-11 DIAGNOSIS — E7849 Other hyperlipidemia: Secondary | ICD-10-CM

## 2019-12-11 DIAGNOSIS — F330 Major depressive disorder, recurrent, mild: Secondary | ICD-10-CM | POA: Diagnosis not present

## 2019-12-11 DIAGNOSIS — M549 Dorsalgia, unspecified: Secondary | ICD-10-CM | POA: Diagnosis not present

## 2019-12-11 DIAGNOSIS — G8929 Other chronic pain: Secondary | ICD-10-CM | POA: Diagnosis not present

## 2019-12-11 DIAGNOSIS — F17200 Nicotine dependence, unspecified, uncomplicated: Secondary | ICD-10-CM

## 2019-12-11 DIAGNOSIS — M542 Cervicalgia: Secondary | ICD-10-CM

## 2019-12-11 DIAGNOSIS — I10 Essential (primary) hypertension: Secondary | ICD-10-CM

## 2019-12-11 MED ORDER — KETOROLAC TROMETHAMINE 60 MG/2ML IM SOLN
60.0000 mg | Freq: Once | INTRAMUSCULAR | Status: AC
  Administered 2019-12-11: 60 mg via INTRAMUSCULAR

## 2019-12-11 MED ORDER — PROMETHAZINE HCL 25 MG PO TABS: 25.0000 mg | ORAL_TABLET | Freq: Three times a day (TID) | ORAL | 3 refills | Status: DC | PRN

## 2019-12-11 NOTE — Patient Instructions (Addendum)
Cancel compazine.

## 2019-12-12 ENCOUNTER — Other Ambulatory Visit: Payer: Self-pay | Admitting: Family Medicine

## 2019-12-12 DIAGNOSIS — F32A Depression, unspecified: Secondary | ICD-10-CM

## 2019-12-12 NOTE — Telephone Encounter (Signed)
Requested Prescriptions  Pending Prescriptions Disp Refills   desvenlafaxine (PRISTIQ) 100 MG 24 hr tablet [Pharmacy Med Name: DESVENLAFAXINE SUCCINATE ER 100 MG] 90 tablet 0    Sig: TAKE 1 TABLET BY MOUTH DAILY     Psychiatry: Antidepressants - SNRI - desvenlafaxine & venlafaxine Failed - 12/12/2019 10:06 AM      Failed - LDL in normal range and within 360 days    LDL Calculated  Date Value Ref Range Status  08/25/2017 113 (H) 0 - 99 mg/dL Final         Failed - Total Cholesterol in normal range and within 360 days    Cholesterol, Total  Date Value Ref Range Status  08/25/2017 175 100 - 199 mg/dL Final         Failed - Triglycerides in normal range and within 360 days    Triglycerides  Date Value Ref Range Status  08/25/2017 180 (H) 0 - 149 mg/dL Final         Failed - Last BP in normal range    BP Readings from Last 1 Encounters:  12/11/19 (!) 152/83         Passed - Completed PHQ-2 or PHQ-9 in the last 360 days.      Passed - Valid encounter within last 6 months    Recent Outpatient Visits          Yesterday Essential hypertension   [facility] Jerrol Banana., MD   3 months ago Chronic neck and back pain   [facility] Jerrol Banana., MD   5 months ago Chronic neck and back pain   [facility] Jerrol Banana., MD   6 months ago Chronic cervical pain   [facility] [place], [place]   8 months ago Essential hypertension   [facility] Jerrol Banana., MD      Future Appointments            In 3 months Jerrol Banana., MD [facility], [place]

## 2019-12-26 DIAGNOSIS — M961 Postlaminectomy syndrome, not elsewhere classified: Secondary | ICD-10-CM | POA: Diagnosis not present

## 2019-12-26 DIAGNOSIS — G894 Chronic pain syndrome: Secondary | ICD-10-CM | POA: Diagnosis not present

## 2019-12-26 DIAGNOSIS — M47817 Spondylosis without myelopathy or radiculopathy, lumbosacral region: Secondary | ICD-10-CM | POA: Diagnosis not present

## 2019-12-26 DIAGNOSIS — M47812 Spondylosis without myelopathy or radiculopathy, cervical region: Secondary | ICD-10-CM | POA: Diagnosis not present

## 2020-01-23 DIAGNOSIS — G894 Chronic pain syndrome: Secondary | ICD-10-CM | POA: Diagnosis not present

## 2020-01-23 DIAGNOSIS — M47812 Spondylosis without myelopathy or radiculopathy, cervical region: Secondary | ICD-10-CM | POA: Diagnosis not present

## 2020-01-23 DIAGNOSIS — M47817 Spondylosis without myelopathy or radiculopathy, lumbosacral region: Secondary | ICD-10-CM | POA: Diagnosis not present

## 2020-01-23 DIAGNOSIS — M961 Postlaminectomy syndrome, not elsewhere classified: Secondary | ICD-10-CM | POA: Diagnosis not present

## 2020-02-19 ENCOUNTER — Other Ambulatory Visit: Payer: Self-pay | Admitting: Family Medicine

## 2020-02-19 DIAGNOSIS — G4733 Obstructive sleep apnea (adult) (pediatric): Secondary | ICD-10-CM

## 2020-02-22 DIAGNOSIS — M47812 Spondylosis without myelopathy or radiculopathy, cervical region: Secondary | ICD-10-CM | POA: Diagnosis not present

## 2020-02-22 DIAGNOSIS — M961 Postlaminectomy syndrome, not elsewhere classified: Secondary | ICD-10-CM | POA: Diagnosis not present

## 2020-02-22 DIAGNOSIS — M47817 Spondylosis without myelopathy or radiculopathy, lumbosacral region: Secondary | ICD-10-CM | POA: Diagnosis not present

## 2020-02-22 DIAGNOSIS — G894 Chronic pain syndrome: Secondary | ICD-10-CM | POA: Diagnosis not present

## 2020-03-12 ENCOUNTER — Other Ambulatory Visit: Payer: Self-pay

## 2020-03-12 ENCOUNTER — Ambulatory Visit (INDEPENDENT_AMBULATORY_CARE_PROVIDER_SITE_OTHER): Payer: PPO | Admitting: Family Medicine

## 2020-03-12 ENCOUNTER — Encounter: Payer: Self-pay | Admitting: Family Medicine

## 2020-03-12 VITALS — BP 136/75 | HR 67 | Temp 98.9°F | Resp 18 | Ht 69.0 in | Wt 188.0 lb

## 2020-03-12 DIAGNOSIS — M542 Cervicalgia: Secondary | ICD-10-CM

## 2020-03-12 DIAGNOSIS — M549 Dorsalgia, unspecified: Secondary | ICD-10-CM

## 2020-03-12 DIAGNOSIS — F32A Depression, unspecified: Secondary | ICD-10-CM | POA: Diagnosis not present

## 2020-03-12 DIAGNOSIS — I10 Essential (primary) hypertension: Secondary | ICD-10-CM

## 2020-03-12 DIAGNOSIS — G8929 Other chronic pain: Secondary | ICD-10-CM | POA: Diagnosis not present

## 2020-03-12 DIAGNOSIS — Z23 Encounter for immunization: Secondary | ICD-10-CM

## 2020-03-12 MED ORDER — KETOROLAC TROMETHAMINE 60 MG/2ML IM SOLN
60.0000 mg | Freq: Once | INTRAMUSCULAR | Status: AC
  Administered 2020-03-12: 60 mg via INTRAMUSCULAR

## 2020-03-12 MED ORDER — DESVENLAFAXINE SUCCINATE ER 100 MG PO TB24: 100.0000 mg | ORAL_TABLET | Freq: Every day | ORAL | 1 refills | Status: DC

## 2020-03-12 MED ORDER — HYDROCHLOROTHIAZIDE 25 MG PO TABS: 25.0000 mg | ORAL_TABLET | Freq: Every day | ORAL | 1 refills | Status: DC

## 2020-03-12 MED ORDER — METOPROLOL SUCCINATE ER 50 MG PO TB24: ORAL_TABLET | ORAL | 1 refills | Status: DC

## 2020-03-12 MED ORDER — PROCHLORPERAZINE MALEATE 10 MG PO TABS: 10.0000 mg | ORAL_TABLET | Freq: Three times a day (TID) | ORAL | 2 refills | Status: DC | PRN

## 2020-03-12 NOTE — Progress Notes (Signed)
**Note Joshua-Identified via Obfuscation** I,[name],acting as a scribe for [name], MD.,have documented all relevant documentation on the behalf of [name], MD,as directed by  [name], MD while in the presence of [name], MD.   Established patient visit   Patient: Joshua Guerrero   DOB: 1955-12-23   64 y.o. Male  MRN: [id number] Visit Date: 03/12/2020  Today's healthcare provider: [name], MD   Chief Complaint  Patient presents with  . Follow-up  . Hypertension   Subjective    HPI  Overall patient feels well and has no complaints today.  He does ask for a Toradol shot for chronic back pain which does help him when he takes it. Hypertension, follow-up  BP Readings from Last 3 Encounters:  03/12/20 136/75  12/11/19 (!) 152/83  09/13/19 115/76   Wt Readings from Last 3 Encounters:  03/12/20 188 lb (85.3 kg)  12/11/19 191 lb (86.6 kg)  09/13/19 206 lb (93.4 kg)     He was last seen for hypertension 3 months ago.  BP at that visit was 152/83. Management since that visit includes; on metoprolol and HCTZ. He reports good compliance with treatment. He is not having side effects. none He is not exercising. He is adherent to low salt diet.   Outside blood pressures are up 14580, 155/90, 130/80  He does not smoke.  Use of agents associated with hypertension: none.   --------------------------------------------------------------------  Depression, Follow-up  He  was last seen for this 3 months ago. Changes made at last visit include; Continue Pristiq.   He reports good compliance with treatment. He is not having side effects. none  He reports good tolerance of treatment. Current symptoms include: n/a He feels he is Unchanged since last visit.  Depression screen [facility] 2/9 03/12/2020 06/20/2019 05/16/2019  Decreased Interest 0 0 0  Down, Depressed, Hopeless 0 0 0  PHQ - 2 Score 0 0 0  Altered sleeping 0 0 -  Tired, decreased energy 0 0 -  Change in  appetite 0 0 -  Feeling bad or failure about yourself  0 0 -  Trouble concentrating 0 0 -  Moving slowly or fidgety/restless 0 0 -  Suicidal thoughts 0 0 -  PHQ-9 Score 0 0 -  Difficult doing work/chores Not difficult at all Not difficult at all -  Some recent data might be hidden    --------------------------------------------------------------------  Chronic neck and back pain From 12/11/2019-given ketorolac (TORADOL) injection 60 mg.  Tobacco abuse From 12/11/2019-Discussed screening chest CT on next visit and cessation.       Medications: Outpatient Medications Prior to Visit  Medication Sig  . baclofen (LIORESAL) 10 MG tablet Take 5 mg by mouth 4 (four) times daily. STARTING AT LUNCHTIME  . fentaNYL (DURAGESIC - DOSED MCG/HR) 25 MCG/HR patch   . hydrOXYzine (ATARAX/VISTARIL) 50 MG tablet TAKE 1 TABLET BY MOUTH 3 TIMES DAILY AS NEEDED (USE AS NEEDED FOR SLEEP)  . lansoprazole (PREVACID) 30 MG capsule TAKE ONE CAPSULE BY MOUTH TWICE A DAY BEFORE A MEAL  . oxyCODONE-acetaminophen (PERCOCET) 10-325 MG tablet Take 1 tablet by mouth every 4 (four) hours. 3AM, 7AM, 11AM, 3 PM, 7PM AND 11PM  . promethazine (PHENERGAN) 25 MG tablet Take 1 tablet (25 mg total) by mouth every 8 (eight) hours as needed for nausea or vomiting.  . sucralfate (CARAFATE) 1 g tablet TAKE 1 TABLET BY MOUTH 4 TIMES A DAY WITH MEALS AND AT BEDTIME  . topiramate (  TOPAMAX) 50 MG tablet Take 50 mg by mouth 2 (two) times daily.  . [DISCONTINUED] desvenlafaxine (PRISTIQ) 100 MG 24 hr tablet TAKE 1 TABLET BY MOUTH DAILY  . [DISCONTINUED] hydrochlorothiazide (HYDRODIURIL) 25 MG tablet TAKE 1 TABLET BY MOUTH DAILY  . [DISCONTINUED] metoprolol succinate (TOPROL-XL) 50 MG 24 hr tablet TAKE 1 TABLET BY MOUTH DAILY WITH OR IMMEDIATELY FOLLOWING A MEAL  . [DISCONTINUED] prochlorperazine (COMPAZINE) 10 MG tablet TAKE 1 TABLET BY MOUTH EVERY 8 HOURS AS NEEDED FOR NAUSEA OR VOMITING  . omeprazole (PRILOSEC) 40 MG capsule TAKE 1  CAPSULE BY MOUTH ONCE DAILY. (Patient not taking: Reported on 12/11/2019)  . ondansetron (ZOFRAN-ODT) 4 MG disintegrating tablet Take 1 tablet (4 mg total) by mouth every 8 (eight) hours as needed for nausea or vomiting. (Patient not taking: Reported on 12/11/2019)   No facility-administered medications prior to visit.    Review of Systems  Constitutional: Negative for appetite change, chills and fever.  Respiratory: Negative for chest tightness, shortness of breath and wheezing.   Cardiovascular: Negative for chest pain and palpitations.  Gastrointestinal: Negative for abdominal pain, nausea and vomiting.       Objective    BP 136/75 (BP Location: Right Arm, Patient Position: Sitting, Cuff Size: Large)   Pulse 67   Temp 98.9 F (37.2 C) (Oral)   Resp 18   Ht 5\' 9"  (1.753 m)   Wt 188 lb (85.3 kg)   SpO2 98%   BMI 27.76 kg/m  BP Readings from Last 3 Encounters:  03/12/20 136/75  12/11/19 (!) 152/83  09/13/19 115/76   Wt Readings from Last 3 Encounters:  03/12/20 188 lb (85.3 kg)  12/11/19 191 lb (86.6 kg)  09/13/19 206 lb (93.4 kg)      Physical Exam Vitals reviewed.  Constitutional:      Appearance: He is well-developed.  HENT:     Head: Normocephalic and atraumatic.     Right Ear: External ear normal.     Left Ear: External ear normal.     Nose: Nose normal.  Eyes:     General: No scleral icterus.    Conjunctiva/sclera: Conjunctivae normal.  Neck:     Thyroid: No thyromegaly.  Cardiovascular:     Rate and Rhythm: Normal rate and regular rhythm.     Heart sounds: Normal heart sounds.  Pulmonary:     Effort: Pulmonary effort is normal.     Breath sounds: Normal breath sounds.  Abdominal:     General: There is no distension.     Palpations: Abdomen is soft.     Tenderness: There is no abdominal tenderness.  Lymphadenopathy:     Cervical: No cervical adenopathy.  Skin:    General: Skin is dry.  Neurological:     General: No focal deficit present.      Mental Status: He is alert and oriented to person, place, and time.  Psychiatric:        Mood and Affect: Mood normal.        Behavior: Behavior normal.        Thought Content: Thought content normal.        Judgment: Judgment normal.       No results found for any visits on 03/12/20.  Assessment & Plan     1. Depression, unspecified depression type In remission.  Continue Pristiq indefinitely. - desvenlafaxine (PRISTIQ) 100 MG 24 hr tablet; Take 1 tablet (100 mg total) by mouth daily.  Dispense: 90 tablet; Refill: 1  2.  Essential hypertension Under good control.  Continue present meds - hydrochlorothiazide (HYDRODIURIL) 25 MG tablet; Take 1 tablet (25 mg total) by mouth daily.  Dispense: 90 tablet; Refill: 1 - metoprolol succinate (TOPROL-XL) 50 MG 24 hr tablet; TAKE 1 TABLET BY MOUTH DAILY WITH OR IMMEDIATELY FOLLOWING A MEAL  Dispense: 90 tablet; Refill: 1  3. Chronic neck and back pain Per pain clinic.  Narcotics make patient nauseated so he is needs regular refill.  Is given an Toradol shot today. - prochlorperazine (COMPAZINE) 10 MG tablet; Take 1 tablet (10 mg total) by mouth every 8 (eight) hours as needed for nausea or vomiting.  Dispense: 90 tablet; Refill: 2  4. Encounter for immunization  - [employer] Vaccine   Return in about 4 months (around 07/10/2020).         Oretta Berkland Cranford Mon, MD  [facility] [phone] (phone) [phone] (fax)  [place]

## 2020-04-01 DIAGNOSIS — G894 Chronic pain syndrome: Secondary | ICD-10-CM | POA: Diagnosis not present

## 2020-04-01 DIAGNOSIS — M961 Postlaminectomy syndrome, not elsewhere classified: Secondary | ICD-10-CM | POA: Diagnosis not present

## 2020-04-01 DIAGNOSIS — M47817 Spondylosis without myelopathy or radiculopathy, lumbosacral region: Secondary | ICD-10-CM | POA: Diagnosis not present

## 2020-04-01 DIAGNOSIS — M47812 Spondylosis without myelopathy or radiculopathy, cervical region: Secondary | ICD-10-CM | POA: Diagnosis not present

## 2020-04-15 ENCOUNTER — Telehealth: Payer: Self-pay

## 2020-04-15 ENCOUNTER — Other Ambulatory Visit: Payer: Self-pay | Admitting: Family Medicine

## 2020-04-15 DIAGNOSIS — R110 Nausea: Secondary | ICD-10-CM

## 2020-04-15 NOTE — Telephone Encounter (Signed)
Please advise 

## 2020-04-15 NOTE — Telephone Encounter (Signed)
Copied from [place] [id number]. Topic: General - Inquiry >> Apr 15, 2020 11:05 AM Gillis Ends D wrote: Reason for CRM: Patient would like for Dr. Caryn Section to stop the phenergan and restart the compazine. The phenergan is no longer working, he will need a new prescription for the compazine. He can reached at [phone]. Please advise

## 2020-04-15 NOTE — Telephone Encounter (Signed)
Requested medication (s) are due for refill today: no  Requested medication (s) are on the active medication list: yes   Last refill:  04/02/2020  Future visit scheduled: no  Notes to clinic:  this refill cannot be delegated   Requested Prescriptions  Pending Prescriptions Disp Refills   promethazine (PHENERGAN) 25 MG tablet [Pharmacy Med Name: PROMETHAZINE HCL 25 MG TAB] 60 tablet 3    Sig: TAKE 1 TABLET BY MOUTH EVERY 8 HOURS AS NEEDED FOR NAUSEA OR VOMITING      Not Delegated - Gastroenterology: Antiemetics Failed - 04/15/2020 11:22 AM      Failed - This refill cannot be delegated      Passed - Valid encounter within last 6 months    Recent Outpatient Visits           1 month ago Encounter for immunization   [facility] Jerrol Banana., MD   4 months ago Essential hypertension   [facility] Jerrol Banana., MD   7 months ago Chronic neck and back pain   [facility] Jerrol Banana., MD   10 months ago Chronic neck and back pain   [facility] Jerrol Banana., MD   10 months ago Chronic cervical pain   [facility] [place], [place]       Future Appointments             In 2 months Jerrol Banana., MD [facility], [place]

## 2020-04-23 ENCOUNTER — Ambulatory Visit: Payer: Self-pay

## 2020-04-23 NOTE — Telephone Encounter (Signed)
Patient called and says he's been having right sided chest pain x 2 weeks that is felt more under the right rib cage. He says the pain doesn't radiate anywhere. The pain is rated a 8/10 and he says sometimes it's more intense than others. He says he feels the pain medicine he takes masks the pain. He says the pain is now becoming worse than it was when it started. He denies any other symptoms, but says he has been nauseated with morning vomiting the past few weeks. He says he had a stomach virus, but the nausea started before that. No availability with PCP within 24 hours as protocol indicated, appointment scheduled for Thursday, 04/25/20 at 0800 with Dr. Rosanna Randy, care advice given, patient verbalized understanding.   Reason for Disposition  [1] Chest pain lasts > 5 minutes AND [2] occurred > 3 days ago (72 hours) AND [3] NO chest pain or cardiac symptoms now  Answer Assessment - Initial Assessment Questions 1. LOCATION: "Where does it hurt?"       Right side to the upper, feels like it's under the rib cage 2. RADIATION: "Does the pain go anywhere else?" (e.g., into neck, jaw, arms, back)     No 3. ONSET: "When did the chest pain begin?" (Minutes, hours or days)      2 weeks ago, but not getting worse 4. PATTERN "Does the pain come and go, or has it been constant since it started?"  "Does it get worse with exertion?"      Constant 5. DURATION: "How long does it last" (e.g., seconds, minutes, hours)     Constant duration 6. SEVERITY: "How bad is the pain?"  (e.g., Scale 1-10; mild, moderate, or severe)    - MILD (1-3): doesn't interfere with normal activities     - MODERATE (4-7): interferes with normal activities or awakens from sleep    - SEVERE (8-10): excruciating pain, unable to do any normal activities       8 7. CARDIAC RISK FACTORS: "Do you have any history of heart problems or risk factors for heart disease?" (e.g., angina, prior heart attack; diabetes, high blood pressure, high  cholesterol, smoker, or strong family history of heart disease)     No 8. PULMONARY RISK FACTORS: "Do you have any history of lung disease?"  (e.g., blood clots in lung, asthma, emphysema, birth control pills)     No 9. CAUSE: "What do you think is causing the chest pain?"     No idea 10. OTHER SYMPTOMS: "Do you have any other symptoms?" (e.g., dizziness, nausea, vomiting, sweating, fever, difficulty breathing, cough)      More nauseated and wake up vomiting last few weeks  11. PREGNANCY: "Is there any chance you are pregnant?" "When was your last menstrual period?"      N/A  Protocols used: CHEST PAIN-A-AH

## 2020-04-24 NOTE — Progress Notes (Signed)
Established patient visit   Patient: Joshua Guerrero   DOB: 1955/05/07   64 y.o. Male  MRN: [id number] Visit Date: 04/25/2020  Today's healthcare provider: [name], MD   Chief Complaint  Patient presents with  . Chest Pain   Subjective    Chest Pain  This is a recurrent problem. The current episode started more than 1 month ago. The onset quality is gradual. The problem occurs constantly. The problem has been gradually worsening. The pain is present in the substernal region. The pain is moderate. The quality of the pain is described as dull. Pertinent negatives include no abdominal pain, fever, nausea, palpitations, shortness of breath or vomiting.  The pain is not exertional.  It is in the right anterior chest wall and hurts with certain movements of his right arm and shoulder.  He has had no systemic symptoms.  He has chronic back pain and headaches and would like a Toradol injection if possible.  Also has chronic nausea with fentanyl patch.  He would like to change to opposing from Phenergan and has become ineffective.      Medications: Outpatient Medications Prior to Visit  Medication Sig  . baclofen (LIORESAL) 10 MG tablet Take 5 mg by mouth 4 (four) times daily. STARTING AT LUNCHTIME  . desvenlafaxine (PRISTIQ) 100 MG 24 hr tablet Take 1 tablet (100 mg total) by mouth daily.  . fentaNYL (DURAGESIC - DOSED MCG/HR) 25 MCG/HR patch   . hydrochlorothiazide (HYDRODIURIL) 25 MG tablet Take 1 tablet (25 mg total) by mouth daily.  . hydrOXYzine (ATARAX/VISTARIL) 50 MG tablet TAKE 1 TABLET BY MOUTH 3 TIMES DAILY AS NEEDED (USE AS NEEDED FOR SLEEP)  . lansoprazole (PREVACID) 30 MG capsule TAKE ONE CAPSULE BY MOUTH TWICE A DAY BEFORE A MEAL  . metoprolol succinate (TOPROL-XL) 50 MG 24 hr tablet TAKE 1 TABLET BY MOUTH DAILY WITH OR IMMEDIATELY FOLLOWING A MEAL  . oxyCODONE-acetaminophen (PERCOCET) 10-325 MG tablet Take 1 tablet by mouth every 4 (four) hours. 3AM, 7AM,  11AM, 3 PM, 7PM AND 11PM  . prochlorperazine (COMPAZINE) 10 MG tablet Take 1 tablet (10 mg total) by mouth every 8 (eight) hours as needed for nausea or vomiting.  . promethazine (PHENERGAN) 25 MG tablet TAKE 1 TABLET BY MOUTH EVERY 8 HOURS AS NEEDED FOR NAUSEA OR VOMITING  . sucralfate (CARAFATE) 1 g tablet TAKE 1 TABLET BY MOUTH 4 TIMES A DAY WITH MEALS AND AT BEDTIME  . topiramate (TOPAMAX) 50 MG tablet Take 50 mg by mouth 2 (two) times daily.  Marland Kitchen omeprazole (PRILOSEC) 40 MG capsule TAKE 1 CAPSULE BY MOUTH ONCE DAILY. (Patient not taking: No sig reported)  . ondansetron (ZOFRAN-ODT) 4 MG disintegrating tablet Take 1 tablet (4 mg total) by mouth every 8 (eight) hours as needed for nausea or vomiting. (Patient not taking: No sig reported)   No facility-administered medications prior to visit.    Review of Systems  Constitutional: Negative for appetite change, chills and fever.  Respiratory: Negative for shortness of breath and wheezing.   Cardiovascular: Positive for chest pain. Negative for palpitations.  Gastrointestinal: Negative for abdominal pain, nausea and vomiting.       Objective    BP 127/67   Pulse 73   Temp 98.6 F (37 C)   Resp 16   Wt 172 lb (78 kg)   BMI 25.40 kg/m  BP Readings from Last 3 Encounters:  04/25/20 127/67  03/12/20 136/75  12/11/19 (!) 152/83  Wt Readings from Last 3 Encounters:  04/25/20 172 lb (78 kg)  03/12/20 188 lb (85.3 kg)  12/11/19 191 lb (86.6 kg)   SpO2 Readings from Last 3 Encounters:  03/12/20 98%  12/11/19 98%  10/14/18 96%      Physical Exam Vitals reviewed.  Constitutional:      Appearance: He is well-developed.  HENT:     Head: Normocephalic and atraumatic.     Right Ear: External ear normal.     Left Ear: External ear normal.     Nose: Nose normal.  Eyes:     General: No scleral icterus.    Conjunctiva/sclera: Conjunctivae normal.  Neck:     Thyroid: No thyromegaly.  Cardiovascular:     Rate and Rhythm: Normal  rate and regular rhythm.     Heart sounds: Normal heart sounds.  Pulmonary:     Effort: Pulmonary effort is normal.     Breath sounds: Normal breath sounds.  Abdominal:     General: There is no distension.     Palpations: Abdomen is soft.     Tenderness: There is no abdominal tenderness.  Lymphadenopathy:     Cervical: No cervical adenopathy.  Skin:    General: Skin is warm and dry.  Neurological:     General: No focal deficit present.     Mental Status: He is alert and oriented to person, place, and time.  Psychiatric:        Mood and Affect: Mood normal.        Behavior: Behavior normal.        Thought Content: Thought content normal.        Judgment: Judgment normal.     ECG reveals normal sinus rhythm heart rate about 72 acute ischemic changes.  Possible old anterior infarct. This is stable from 2018. No results found for any visits on 04/25/20.  Assessment & Plan     1. Chest pain, unspecified type Noncardiac chest pain.  At this time with a little bit of cough we will also obtain chest x-ray.  Work-up as indicated. - EKG 12-Lead - DG Chest 2 View  2. Cough This x-ray after visit shows right-sided infiltrate.  Treat as infection presently and follow-up in a few weeks obtain follow-up chest x-ray and probable CT scan. - DG Chest 2 View  3. Chronic nausea Try Compazine.  4. Chronic neck and back pain Shot for Toradol. - prochlorperazine (COMPAZINE) 10 MG tablet; Take 1 tablet (10 mg total) by mouth every 8 (eight) hours as needed for nausea or vomiting.  Dispense: 90 tablet; Refill: 2 - ketorolac (TORADOL) injection 60 mg  5. Need for influenza vaccination  - Flu Vaccine QUAD 6+ mos PF IM (Fluarix Quad PF)   No follow-ups on file.         Niva Murren Cranford Mon, MD  [facility] [phone] (phone) [phone] (fax)  [place]

## 2020-04-25 ENCOUNTER — Telehealth: Payer: Self-pay

## 2020-04-25 ENCOUNTER — Other Ambulatory Visit: Payer: Self-pay

## 2020-04-25 ENCOUNTER — Ambulatory Visit
Admission: RE | Admit: 2020-04-25 | Discharge: 2020-04-25 | Disposition: A | Discharge status: Home / Self Care | Payer: PPO | Attending: Family Medicine | Admitting: Family Medicine

## 2020-04-25 ENCOUNTER — Encounter: Payer: Self-pay | Admitting: Family Medicine

## 2020-04-25 ENCOUNTER — Ambulatory Visit
Admission: RE | Admit: 2020-04-25 | Discharge: 2020-04-25 | Disposition: A | Discharge status: Home / Self Care | Payer: PPO | Source: Ambulatory Visit | Attending: Family Medicine | Admitting: Family Medicine

## 2020-04-25 ENCOUNTER — Ambulatory Visit (INDEPENDENT_AMBULATORY_CARE_PROVIDER_SITE_OTHER): Payer: PPO | Admitting: Family Medicine

## 2020-04-25 VITALS — BP 127/67 | HR 73 | Temp 98.6°F | Resp 16 | Wt 172.0 lb

## 2020-04-25 DIAGNOSIS — J189 Pneumonia, unspecified organism: Secondary | ICD-10-CM

## 2020-04-25 DIAGNOSIS — R079 Chest pain, unspecified: Secondary | ICD-10-CM | POA: Insufficient documentation

## 2020-04-25 DIAGNOSIS — R059 Cough, unspecified: Secondary | ICD-10-CM | POA: Insufficient documentation

## 2020-04-25 DIAGNOSIS — M542 Cervicalgia: Secondary | ICD-10-CM

## 2020-04-25 DIAGNOSIS — R110 Nausea: Secondary | ICD-10-CM | POA: Diagnosis not present

## 2020-04-25 DIAGNOSIS — Z23 Encounter for immunization: Secondary | ICD-10-CM

## 2020-04-25 DIAGNOSIS — M549 Dorsalgia, unspecified: Secondary | ICD-10-CM

## 2020-04-25 DIAGNOSIS — G8929 Other chronic pain: Secondary | ICD-10-CM | POA: Diagnosis not present

## 2020-04-25 MED ORDER — PROCHLORPERAZINE MALEATE 10 MG PO TABS: 10.0000 mg | ORAL_TABLET | Freq: Three times a day (TID) | ORAL | 2 refills | Status: DC | PRN

## 2020-04-25 MED ORDER — DOXYCYCLINE HYCLATE 100 MG PO TABS: 100.0000 mg | ORAL_TABLET | Freq: Two times a day (BID) | ORAL | 0 refills | Status: AC

## 2020-04-25 MED ORDER — KETOROLAC TROMETHAMINE 60 MG/2ML IM SOLN
60.0000 mg | Freq: Once | INTRAMUSCULAR | Status: AC
  Administered 2020-04-25: 60 mg via INTRAMUSCULAR

## 2020-04-25 NOTE — Telephone Encounter (Signed)
-----   Message from Jerrol Banana., MD sent at 04/25/2020 10:45 AM EST ----- Possible pneumonia on chest x-ray.  Treat with doxycycline for 10 days.  Follow-up in mid January.

## 2020-04-25 NOTE — Patient Instructions (Signed)
Stop Phenergan.

## 2020-04-25 NOTE — Telephone Encounter (Signed)
Advised patient of results. Medication was sent into the pharmacy.  

## 2020-04-30 DIAGNOSIS — G894 Chronic pain syndrome: Secondary | ICD-10-CM | POA: Diagnosis not present

## 2020-04-30 DIAGNOSIS — M961 Postlaminectomy syndrome, not elsewhere classified: Secondary | ICD-10-CM | POA: Diagnosis not present

## 2020-04-30 DIAGNOSIS — M791 Myalgia, unspecified site: Secondary | ICD-10-CM | POA: Diagnosis not present

## 2020-04-30 DIAGNOSIS — M47817 Spondylosis without myelopathy or radiculopathy, lumbosacral region: Secondary | ICD-10-CM | POA: Diagnosis not present

## 2020-04-30 DIAGNOSIS — M47812 Spondylosis without myelopathy or radiculopathy, cervical region: Secondary | ICD-10-CM | POA: Diagnosis not present

## 2020-05-06 ENCOUNTER — Other Ambulatory Visit: Payer: Self-pay | Admitting: Family Medicine

## 2020-05-06 DIAGNOSIS — G4733 Obstructive sleep apnea (adult) (pediatric): Secondary | ICD-10-CM

## 2020-05-16 ENCOUNTER — Telehealth: Payer: Self-pay

## 2020-05-16 ENCOUNTER — Other Ambulatory Visit: Payer: Self-pay

## 2020-05-16 NOTE — Telephone Encounter (Signed)
-----   Message from Glennie Isle, [place] sent at 09/25/2019  4:23 PM EDT ----- Check to see how pt is doing with his back and see if he is ready to schedule MRI.  ----- Message ----- From: Glennie Isle, CMA Sent: 08/14/2019 To: Glennie Isle, CMA  Pt requested to wait another month.  ----- Message ----- From: Glennie Isle, CMA Sent: 05/08/2019 To: Glennie Isle, CMA   ----- Message ----- From: Glennie Isle, CMA Sent: 01/03/2019 To: Glennie Isle, CMA  Pt will need MRI abdomen to re evaluate lesion on pancreas. Pt aware.

## 2020-05-16 NOTE — Telephone Encounter (Signed)
Mailed pt letter requesting a call back to schedule MRI of the pancreas when he is ready.

## 2020-05-20 NOTE — Progress Notes (Signed)
Subjective:   Joshua Guerrero is a 65 y.o. male who presents for Medicare Annual/Subsequent preventive examination.  I connected with Joshua Guerrero today by telephone and verified that I am speaking with the correct person using two identifiers. Location patient: home Location provider: work Persons participating in the virtual visit: patient, provider.   I discussed the limitations, risks, security and privacy concerns of performing an evaluation and management service by telephone and the availability of in person appointments. I also discussed with the patient that there may be a patient responsible charge related to this service. The patient expressed understanding and verbally consented to this telephonic visit.    Interactive audio and video telecommunications were attempted between this provider and patient, however failed, due to patient having technical difficulties OR patient did not have access to video capability.  We continued and completed visit with audio only.   Review of Systems    N/A  Cardiac Risk Factors include: advanced age (>42men, >23 women);male gender;hypertension;smoking/ tobacco exposure     Objective:    Today's Vitals   05/21/20 1531  PainSc: 8    There is no height or weight on file to calculate BMI.  Advanced Directives 05/21/2020 05/16/2019 10/14/2018 02/24/2017 05/26/2016 05/25/2016 05/24/2016  Does Patient Have a Medical Advance Directive? Yes Yes Yes Yes Yes Yes (No Data)  Type of Paramedic of [place];Living will [place];Living will Living will;Healthcare Power of [place] will Living will -  Does patient want to make changes to medical advance directive? - - No - Patient declined - No - Patient declined No - Patient declined -  Copy of [place] in Chart? Yes - validated most recent copy scanned in chart (See row information) Yes - validated most  recent copy scanned in chart (See row information) No - copy requested No - copy requested No - copy requested No - copy requested -    Current Medications (verified) Outpatient Encounter Medications as of 05/21/2020  Medication Sig  . baclofen (LIORESAL) 10 MG tablet Take 5 mg by mouth 4 (four) times daily. STARTING AT LUNCHTIME  . desvenlafaxine (PRISTIQ) 100 MG 24 hr tablet Take 1 tablet (100 mg total) by mouth daily.  . fentaNYL (DURAGESIC - DOSED MCG/HR) 25 MCG/HR patch   . hydrochlorothiazide (HYDRODIURIL) 25 MG tablet Take 1 tablet (25 mg total) by mouth daily.  . hydrOXYzine (ATARAX/VISTARIL) 50 MG tablet TAKE 1 TABLET BY MOUTH 3 TIMES DAILY AS NEEDED (USE AS NEEDED FOR SLEEP)  . lansoprazole (PREVACID) 30 MG capsule TAKE ONE CAPSULE BY MOUTH TWICE A DAY BEFORE A MEAL  . metoprolol succinate (TOPROL-XL) 50 MG 24 hr tablet TAKE 1 TABLET BY MOUTH DAILY WITH OR IMMEDIATELY FOLLOWING A MEAL  . omeprazole (PRILOSEC) 40 MG capsule TAKE 1 CAPSULE BY MOUTH ONCE DAILY. (Patient not taking: No sig reported)  . ondansetron (ZOFRAN-ODT) 4 MG disintegrating tablet Take 1 tablet (4 mg total) by mouth every 8 (eight) hours as needed for nausea or vomiting. (Patient not taking: No sig reported)  . oxyCODONE-acetaminophen (PERCOCET) 10-325 MG tablet Take 1 tablet by mouth every 4 (four) hours. 3AM, 7AM, 11AM, 3 PM, 7PM AND 11PM  . prochlorperazine (COMPAZINE) 10 MG tablet Take 1 tablet (10 mg total) by mouth every 8 (eight) hours as needed for nausea or vomiting.  . promethazine (PHENERGAN) 25 MG tablet TAKE 1 TABLET BY MOUTH EVERY 8 HOURS AS NEEDED FOR NAUSEA OR  VOMITING  . sucralfate (CARAFATE) 1 g tablet TAKE 1 TABLET BY MOUTH 4 TIMES A DAY WITH MEALS AND AT BEDTIME  . topiramate (TOPAMAX) 50 MG tablet Take 50 mg by mouth 2 (two) times daily.   No facility-administered encounter medications on file as of 05/21/2020.    Allergies (verified) Acetaminophen, Codeine, and Opana [oxymorphone]    History: Past Medical History:  Diagnosis Date  . Arthritis    lower back  . Brain bleed (HCC) 10/2016   AT [facility]-TREATED MEDICALLY NO SURGERY  . Confusion caused by a drug    fentanyl and oxycodone  . GERD (gastroesophageal reflux disease)   . Headache    past hx, secondary to nerve damage from accident  . Hypertension    CONTROLLED ON MEDS  . Neck pain, chronic   . 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 Sleep apnea    Not currently using a C-Pap   Past Surgical History:  Procedure Laterality Date  . COLONOSCOPY WITH PROPOFOL N/A 02/11/2015   Procedure: COLONOSCOPY WITH PROPOFOL;  Surgeon: Midge Minium, MD;  Location: [facility];  Service: Endoscopy;  Laterality: N/A;  USES C-PAP  . ESOPHAGEAL DILATION N/A 10/14/2018   Procedure: ESOPHAGEAL DILATION;  Surgeon: Midge Minium, MD;  Location: [facility];  Service: Endoscopy;  Laterality: N/A;  . ESOPHAGOGASTRODUODENOSCOPY (EGD) WITH PROPOFOL N/A 10/14/2018   Procedure: ESOPHAGOGASTRODUODENOSCOPY (EGD) WITH BIOPSY;  Surgeon: Midge Minium, MD;  Location: [facility];  Service: Endoscopy;  Laterality: N/A;  sleep apnea  . NASAL SINUS SURGERY     due to severe sleep apnea  . NECK SURGERY     diffuse disc-and put in possibly screws patient thinks  . SHOULDER SURGERY Bilateral    x 2  . UMBILICAL HERNIA REPAIR N/A 02/26/2017   Primary repair.  Surgeon: Earline Mayotte, MD;  Location: [facility];  Service: General;  Laterality: N/A;   Family History  Problem Relation Age of Onset  . Dementia Father   . Cancer Mother   . Cancer Sister   . Cancer Maternal Aunt    Social History   Socioeconomic History  . Marital status: Divorced    Spouse name: single  . Number of children: 0  . Years of education: 52  . Highest education level: High school graduate  Occupational History  . Occupation: disability  Tobacco Use  . Smoking status: Current Every Day Smoker     Packs/day: 0.25    Years: 20.00    Pack years: 5.00    Types: Cigarettes  . Smokeless tobacco: Never Used  . Tobacco comment: currently about 1/4 PPD  Vaping Use  . Vaping Use: Never used  Substance and Sexual Activity  . Alcohol use: No  . Drug use: No  . Sexual activity: Never  Other Topics Concern  . Not on file  Social History Narrative  . Not on file   Social Determinants of Health   Financial Resource Strain: Low Risk   . Difficulty of Paying Living Expenses: Not hard at all  Food Insecurity: No Food Insecurity  . Worried About Programme researcher, broadcasting/film/video in the Last Year: Never true  . Ran Out of Food in the Last Year: Never true  Transportation Needs: No Transportation Needs  . Lack of Transportation (Medical): No  . Lack of Transportation (Non-Medical): No  Physical Activity: Inactive  . Days of Exercise per Week: 0 days  .  Minutes of Exercise per Session: 0 min  Stress: No Stress Concern Present  . Feeling of Stress : Only a little  Social Connections: Moderately Isolated  . Frequency of Communication with Friends and Family: More than three times a week  . Frequency of Social Gatherings with Friends and Family: More than three times a week  . Attends Religious Services: More than 4 times per year  . Active Member of Clubs or Organizations: No  . Attends Archivist Meetings: Never  . Marital Status: Divorced    Tobacco Counseling Ready to quit: No Counseling given: No Comment: currently about 1/4 PPD   Clinical Intake:  Pre-visit preparation completed: Yes  Pain : 0-10 Pain Score: 8  Pain Type: Chronic pain Pain Location: Back (and neck) Pain Orientation: Upper Pain Descriptors / Indicators: Aching Pain Frequency: Constant Pain Relieving Factors: Pt is taking Fentanyl and Oxycodone as prescribed for pain.  Pain Relieving Factors: Pt is taking Fentanyl and Oxycodone as prescribed for pain.  Nutritional Risks: None Diabetes: No  How often  do you need to have someone help you when you read instructions, pamphlets, or other written materials from your doctor or pharmacy?: 1 - Never  Diabetic? No  Interpreter Needed?: No  Information entered by :: [facility], LPN   Activities of Daily Living In your present state of health, do you have any difficulty performing the following activities: 05/21/2020 03/12/2020  Hearing? N Y  Vision? N -  Difficulty concentrating or making decisions? N N  Walking or climbing stairs? N N  Dressing or bathing? N N  Doing errands, shopping? N Y  Conservation officer, nature and eating ? N -  Using the Toilet? N -  In the past six months, have you accidently leaked urine? N -  Do you have problems with loss of bowel control? N -  Managing your Medications? N -  Managing your Finances? N -  Housekeeping or managing your Housekeeping? N -  Some recent data might be hidden    Patient Care Team: Jerrol Banana., MD as PCP - General (Family Medicine) Jerrol Banana., MD (Family Medicine) Lucilla Lame, MD as Consulting Physician (Gastroenterology) Margaretha Sheffield, MD as Referring Physician (Physical Medicine and Rehabilitation) System, Provider Not In  Indicate any recent Medical Services you may have received from other than [facility] providers in the past year (date may be approximate).     Assessment:   This is a routine wellness examination for Marlee.  Hearing/Vision screen No exam data present  Dietary issues and exercise activities discussed: Current Exercise Habits: The patient does not participate in regular exercise at present, Exercise limited by: orthopedic condition(s)  Goals    . Quit Smoking     Recommend to quit smoking as planned in Spring 2021.       Depression Screen PHQ 2/9 Scores 03/12/2020 06/20/2019 05/16/2019 03/22/2019 09/08/2018 03/16/2018 03/07/2018  PHQ - 2 Score 0 0 0 0 3 4 5   PHQ- 9 Score 0 0 - 3 5 - 14    Fall Risk Fall Risk  05/21/2020 03/12/2020 05/16/2019  09/08/2018 03/16/2018  Falls in the past year? 0 0 0 0 0  Number falls in past yr: 0 0 0 - -  Injury with Fall? 0 0 0 - -  Follow up - Falls evaluation completed - - -    FALL RISK PREVENTION PERTAINING TO THE HOME:  Any stairs in or around the home? No  If so, are  there any without handrails? No  Home free of loose throw rugs in walkways, pet beds, electrical cords, etc? Yes  Adequate lighting in your home to reduce risk of falls? Yes   ASSISTIVE DEVICES UTILIZED TO PREVENT FALLS:  Life alert? No  Use of a cane, walker or w/c? No  Grab bars in the bathroom? Yes  Shower chair or bench in shower? No  Elevated toilet seat or a handicapped toilet? Yes    Cognitive Function: Normal cognitive status assessed by direct observation by this Nurse Health Advisor. No abnormalities found.          Immunizations Immunization History  Administered Date(s) Administered  . Influenza,inj,Quad PF,6+ Mos 02/20/2015, 05/26/2016, 12/23/2016, 03/07/2018, 03/22/2019, 04/25/2020  . PFIZER(Purple Top)SARS-COV-2 Vaccination 08/31/2019, 09/28/2019, 03/12/2020  . Pneumococcal Polysaccharide-23 05/26/2016  . Td 08/17/2018  . Tdap 07/31/2008    TDAP status: Up to date  Flu Vaccine status: Up to date  Covid-19 vaccine status: Completed vaccines  Qualifies for Shingles Vaccine? Yes   Zostavax completed No   Shingrix Completed?: No.    Education has been provided regarding the importance of this vaccine. Patient has been advised to call insurance company to determine out of pocket expense if they have not yet received this vaccine. Advised may also receive vaccine at local pharmacy or Health Dept. Verbalized acceptance and understanding.  Screening Tests Health Maintenance  Topic Date Due  . Hepatitis C Screening  Never done  . HIV Screening  Never done  . COLONOSCOPY (Pts 45-52yrs Insurance coverage will need to be confirmed)  02/10/2025  . TETANUS/TDAP  08/16/2028  . INFLUENZA VACCINE   Completed  . COVID-19 Vaccine  Completed    Health Maintenance  Health Maintenance Due  Topic Date Due  . Hepatitis C Screening  Never done  . HIV Screening  Never done    Colorectal cancer screening: Type of screening: Colonoscopy. Completed 02/11/15. Repeat every 10 years  Lung Cancer Screening: (Low Dose CT Chest recommended if Age 62-80 years, 30 pack-year currently smoking OR have quit w/in 15years.) does qualify however completed a scan 04/2020.  Additional Screening:  Hepatitis C Screening: does qualify and would like this ordered added to next BW orders.   Vision Screening: Recommended annual ophthalmology exams for early detection of glaucoma and other disorders of the eye. Is the patient up to date with their annual eye exam?  Yes  Who is the provider or what is the name of the office in which the patient attends annual eye exams? [facility] If pt is not established with a provider, would they like to be referred to a provider to establish care? No .   Dental Screening: Recommended annual dental exams for proper oral hygiene  Community Resource Referral / Chronic Care Management: CRR required this visit?  No   CCM required this visit?  No      Plan:     I have personally reviewed and noted the following in the patient's chart:   . Medical and social history . Use of alcohol, tobacco or illicit drugs  . Current medications and supplements . Functional ability and status . Nutritional status . Physical activity . Advanced directives . List of other physicians . Hospitalizations, surgeries, and ER visits in previous 12 months . Vitals . Screenings to include cognitive, depression, and falls . Referrals and appointments  In addition, I have reviewed and discussed with patient certain preventive protocols, quality metrics, and best practice recommendations. A written personalized  care plan for preventive services as well as general preventive health  recommendations were provided to patient.     Lineth Thielke [place], [place]   1/69/6789   Nurse Notes: Pt would like the Hep C lab order added to next in office blood work orders.

## 2020-05-21 ENCOUNTER — Other Ambulatory Visit: Payer: Self-pay

## 2020-05-21 ENCOUNTER — Ambulatory Visit (INDEPENDENT_AMBULATORY_CARE_PROVIDER_SITE_OTHER): Payer: PPO

## 2020-05-21 DIAGNOSIS — Z0000 Encounter for general adult medical examination without abnormal findings: Secondary | ICD-10-CM

## 2020-05-27 ENCOUNTER — Ambulatory Visit: Payer: Self-pay | Admitting: *Deleted

## 2020-05-27 NOTE — Telephone Encounter (Signed)
Patient is calling to report that the symptoms he had with his pneumonia are returning- he is requesting appointment- call to office- virtual visit scheduled- will review with provider and see if he can have in person visit- patient reports he has broken phone and doesn't know if call will go through- he will call in am to see if he can come to office for his appointment tomorrow.  Reason for Disposition . Taking a deep breath makes pain worse  Answer Assessment - Initial Assessment Questions 1. LOCATION: "Where does it hurt?"       R upper quad of lung 2. RADIATION: "Does the pain go anywhere else?" (e.g., into neck, jaw, arms, back)     no 3. ONSET: "When did the chest pain begin?" (Minutes, hours or days)      Couple days ago 4. PATTERN "Does the pain come and go, or has it been constant since it started?"  "Does it get worse with exertion?"      Comes and goes- with pain medication- is masking the pain some 5. DURATION: "How long does it last" (e.g., seconds, minutes, hours)     constant 6. SEVERITY: "How bad is the pain?"  (e.g., Scale 1-10; mild, moderate, or severe)    - MILD (1-3): doesn't interfere with normal activities     - MODERATE (4-7): interferes with normal activities or awakens from sleep    - SEVERE (8-10): excruciating pain, unable to do any normal activities       modearte 7. CARDIAC RISK FACTORS: "Do you have any history of heart problems or risk factors for heart disease?" (e.g., angina, prior heart attack; diabetes, high blood pressure, high cholesterol, smoker, or strong family history of heart disease)     no 8. PULMONARY RISK FACTORS: "Do you have any history of lung disease?"  (e.g., blood clots in lung, asthma, emphysema, birth control pills)     Recent pneumonia 9. CAUSE: "What do you think is causing the chest pain?"     recent pneumonia  10. OTHER SYMPTOMS: "Do you have any other symptoms?" (e.g., dizziness, nausea, vomiting, sweating, fever, difficulty  breathing, cough)       no 11. PREGNANCY: "Is there any chance you are pregnant?" "When was your last menstrual period?"       n/a  Protocols used: CHEST PAIN-A-AH

## 2020-05-28 ENCOUNTER — Other Ambulatory Visit: Payer: Self-pay

## 2020-05-28 ENCOUNTER — Encounter: Payer: PPO | Admitting: Family Medicine

## 2020-05-28 NOTE — Progress Notes (Deleted)
MyChart Video Visit    Virtual Visit via Video Note   This visit type was conducted due to national recommendations for restrictions regarding the COVID-19 Pandemic (e.g. social distancing) in an effort to limit this patient's exposure and mitigate transmission in our community. This patient is at least at moderate risk for complications without adequate follow up. This format is felt to be most appropriate for this patient at this time. Physical exam was limited by quality of the video and audio technology used for the visit.   Patient location: *** Provider location: ***  I discussed the limitations of evaluation and management by telemedicine and the availability of in person appointments. The patient expressed understanding and agreed to proceed.  Patient: Joshua Guerrero   DOB: 03/18/1956   65 y.o. Male  MRN: [id number] Visit Date: 05/28/2020  Today's healthcare provider: [name], MD   No chief complaint on file.  Subjective    HPI  ***  {Show patient history (optional):23778::" "}  Medications: Outpatient Medications Prior to Visit  Medication Sig  . baclofen (LIORESAL) 10 MG tablet Take 5 mg by mouth 4 (four) times daily. STARTING AT LUNCHTIME  . desvenlafaxine (PRISTIQ) 100 MG 24 hr tablet Take 1 tablet (100 mg total) by mouth daily.  . fentaNYL (DURAGESIC - DOSED MCG/HR) 25 MCG/HR patch   . hydrochlorothiazide (HYDRODIURIL) 25 MG tablet Take 1 tablet (25 mg total) by mouth daily.  . hydrOXYzine (ATARAX/VISTARIL) 50 MG tablet TAKE 1 TABLET BY MOUTH 3 TIMES DAILY AS NEEDED (USE AS NEEDED FOR SLEEP)  . lansoprazole (PREVACID) 30 MG capsule TAKE ONE CAPSULE BY MOUTH TWICE A DAY BEFORE A MEAL  . metoprolol succinate (TOPROL-XL) 50 MG 24 hr tablet TAKE 1 TABLET BY MOUTH DAILY WITH OR IMMEDIATELY FOLLOWING A MEAL  . omeprazole (PRILOSEC) 40 MG capsule TAKE 1 CAPSULE BY MOUTH ONCE DAILY. (Patient not taking: No sig reported)  . ondansetron (ZOFRAN-ODT) 4 MG  disintegrating tablet Take 1 tablet (4 mg total) by mouth every 8 (eight) hours as needed for nausea or vomiting. (Patient not taking: No sig reported)  . oxyCODONE-acetaminophen (PERCOCET) 10-325 MG tablet Take 1 tablet by mouth every 4 (four) hours. 3AM, 7AM, 11AM, 3 PM, 7PM AND 11PM  . prochlorperazine (COMPAZINE) 10 MG tablet Take 1 tablet (10 mg total) by mouth every 8 (eight) hours as needed for nausea or vomiting.  . promethazine (PHENERGAN) 25 MG tablet TAKE 1 TABLET BY MOUTH EVERY 8 HOURS AS NEEDED FOR NAUSEA OR VOMITING  . sucralfate (CARAFATE) 1 g tablet TAKE 1 TABLET BY MOUTH 4 TIMES A DAY WITH MEALS AND AT BEDTIME  . topiramate (TOPAMAX) 50 MG tablet Take 50 mg by mouth 2 (two) times daily.   No facility-administered medications prior to visit.    Review of Systems  {Labs  Heme  Chem  Endocrine  Serology  Results Review (optional):23779::" "}  Objective    There were no vitals taken for this visit. {Show previous vital signs (optional):23777::" "}  Physical Exam     Assessment & Plan     ***  No follow-ups on file.     I discussed the assessment and treatment plan with the patient. The patient was provided an opportunity to ask questions and all were answered. The patient agreed with the plan and demonstrated an understanding of the instructions.   The patient was advised to call back or seek an in-person evaluation if the symptoms worsen or if the condition  fails to improve as anticipated.  I provided *** minutes of non-face-to-face time during this encounter.  {provider attestation***:1}  [name], MD [facility] [phone] (phone) [phone] (fax)  [place]

## 2020-05-29 ENCOUNTER — Telehealth (INDEPENDENT_AMBULATORY_CARE_PROVIDER_SITE_OTHER): Payer: PPO | Admitting: Family Medicine

## 2020-05-29 DIAGNOSIS — R911 Solitary pulmonary nodule: Secondary | ICD-10-CM

## 2020-05-29 DIAGNOSIS — M47817 Spondylosis without myelopathy or radiculopathy, lumbosacral region: Secondary | ICD-10-CM | POA: Diagnosis not present

## 2020-05-29 DIAGNOSIS — J189 Pneumonia, unspecified organism: Secondary | ICD-10-CM

## 2020-05-29 DIAGNOSIS — G894 Chronic pain syndrome: Secondary | ICD-10-CM | POA: Diagnosis not present

## 2020-05-29 DIAGNOSIS — M961 Postlaminectomy syndrome, not elsewhere classified: Secondary | ICD-10-CM | POA: Diagnosis not present

## 2020-05-29 DIAGNOSIS — R079 Chest pain, unspecified: Secondary | ICD-10-CM

## 2020-05-29 DIAGNOSIS — M47812 Spondylosis without myelopathy or radiculopathy, cervical region: Secondary | ICD-10-CM | POA: Diagnosis not present

## 2020-05-29 NOTE — Progress Notes (Signed)
Est cTVirtual Visit via Telephone Note  I connected with Joshua Guerrero on 05/29/20 at  4:00 PM EST by telephone and verified that I am speaking with the correct person using two identifiers.  Location: Patient: Home Provider: Office   I discussed the limitations, risks, security and privacy concerns of performing an evaluation and management service by telephone and the availability of in person appointments. I also discussed with the patient that there may be a patient responsible charge related to this service. The patient expressed understanding and agreed to proceed.   History of Present Illness: I called the patient at least 5 different times over 2 days time the phone rang and went directly to voicemail.  Staff also left messages on voicemail.  Was never able to speak to the patient.   Observations/Objective:   Assessment and Plan: 1. Solitary pulmonary nodule Chest mass on chest x-ray a month ago possible pneumonia.  Follow-up with CT scan now.  2. Chest pain, unspecified type Chest pain was on the right side a month ago.  Follow-up with CT.  3. Pneumonia of right middle lobe due to infectious organism Patient is only having the chest pain to my knowledge.  Unable to catch up with him by phone.  To have a month office next week.  CT is ordered.   Follow Up Instructions:    I discussed the assessment and treatment plan with the patient. The patient was provided an opportunity to ask questions and all were answered. The patient agreed with the plan and demonstrated an understanding of the instructions.   The patient was advised to call back or seek an in-person evaluation if the symptoms worsen or if the condition fails to improve as anticipated.  I provided 0 minutes of non-face-to-face time during this encounter.   [name], MD

## 2020-06-03 ENCOUNTER — Telehealth: Payer: Self-pay | Admitting: Family Medicine

## 2020-06-03 NOTE — Telephone Encounter (Signed)
I have tried about 10 times to get through on the phone.  Cannot get through at all.  I have set up a CT scan of the chest for him for this pain.  There was a pneumonia possible mass on chest x-ray last month.  CT needs to be done.

## 2020-06-03 NOTE — Telephone Encounter (Signed)
Unable to get through.  [facility] triage may give patient the message that we have been trying to reach him to let him know about the results and the referral for scan

## 2020-06-03 NOTE — Telephone Encounter (Signed)
Pt states that he is hving the same feeling in his chest that he had when he was diagnosed with pneumonia. Please advise

## 2020-06-03 NOTE — Telephone Encounter (Signed)
Isn't this what you just saw him for last week

## 2020-06-04 NOTE — Telephone Encounter (Signed)
Patient called and given information below from Dr. Rosanna Randy, patient verbalized understanding. He says he has an appointment on 06/14/20 for the CT Scan.

## 2020-06-05 ENCOUNTER — Encounter: Payer: Self-pay | Admitting: Family Medicine

## 2020-06-05 ENCOUNTER — Ambulatory Visit (INDEPENDENT_AMBULATORY_CARE_PROVIDER_SITE_OTHER): Payer: PPO | Admitting: Family Medicine

## 2020-06-05 ENCOUNTER — Other Ambulatory Visit: Payer: Self-pay

## 2020-06-05 VITALS — BP 129/78 | HR 80 | Temp 98.5°F | Resp 18 | Wt 173.0 lb

## 2020-06-05 DIAGNOSIS — G8929 Other chronic pain: Secondary | ICD-10-CM | POA: Diagnosis not present

## 2020-06-05 DIAGNOSIS — J849 Interstitial pulmonary disease, unspecified: Secondary | ICD-10-CM | POA: Insufficient documentation

## 2020-06-05 DIAGNOSIS — F330 Major depressive disorder, recurrent, mild: Secondary | ICD-10-CM | POA: Diagnosis not present

## 2020-06-05 DIAGNOSIS — J189 Pneumonia, unspecified organism: Secondary | ICD-10-CM

## 2020-06-05 DIAGNOSIS — M542 Cervicalgia: Secondary | ICD-10-CM

## 2020-06-05 DIAGNOSIS — M549 Dorsalgia, unspecified: Secondary | ICD-10-CM

## 2020-06-05 DIAGNOSIS — R079 Chest pain, unspecified: Secondary | ICD-10-CM

## 2020-06-05 DIAGNOSIS — D801 Nonfamilial hypogammaglobulinemia: Secondary | ICD-10-CM | POA: Diagnosis not present

## 2020-06-05 MED ORDER — DOXYCYCLINE HYCLATE 100 MG PO TABS
100.0000 mg | ORAL_TABLET | Freq: Two times a day (BID) | ORAL | 0 refills | Status: DC
Start: 2020-06-05 — End: 2020-07-23

## 2020-06-05 MED ORDER — KETOROLAC TROMETHAMINE 60 MG/2ML IM SOLN
60.0000 mg | Freq: Once | INTRAMUSCULAR | Status: AC
  Administered 2020-06-05: 60 mg via INTRAMUSCULAR

## 2020-06-05 NOTE — Progress Notes (Signed)
I,[name],acting as a scribe for [name], MD.,have documented all relevant documentation on the behalf of [name], MD,as directed by  [name], MD while in the presence of [name], MD.   Established patient visit   Patient: Joshua Guerrero   DOB: Nov 14, 1955   65 y.o. Male  MRN: [id number] Visit Date: 06/05/2020  Today's healthcare provider: [name], MD   No chief complaint on file.  Subjective    HPI  Patient is here concerning possible pneumonia. Patient has a CT scan scheduled for 06/14/2020.  Continues to have right-sided chest pain.  Again he states it feels as though he has pneumonia again.  No cough no fever no shortness of breath.     Medications: Outpatient Medications Prior to Visit  Medication Sig  . baclofen (LIORESAL) 10 MG tablet Take 5 mg by mouth 4 (four) times daily. STARTING AT LUNCHTIME  . desvenlafaxine (PRISTIQ) 100 MG 24 hr tablet Take 1 tablet (100 mg total) by mouth daily.  . fentaNYL (DURAGESIC - DOSED MCG/HR) 25 MCG/HR patch   . hydrochlorothiazide (HYDRODIURIL) 25 MG tablet Take 1 tablet (25 mg total) by mouth daily.  . hydrOXYzine (ATARAX/VISTARIL) 50 MG tablet TAKE 1 TABLET BY MOUTH 3 TIMES DAILY AS NEEDED (USE AS NEEDED FOR SLEEP)  . lansoprazole (PREVACID) 30 MG capsule TAKE ONE CAPSULE BY MOUTH TWICE A DAY BEFORE A MEAL  . metoprolol succinate (TOPROL-XL) 50 MG 24 hr tablet TAKE 1 TABLET BY MOUTH DAILY WITH OR IMMEDIATELY FOLLOWING A MEAL  . oxyCODONE-acetaminophen (PERCOCET) 10-325 MG tablet Take 1 tablet by mouth every 4 (four) hours. 3AM, 7AM, 11AM, 3 PM, 7PM AND 11PM  . prochlorperazine (COMPAZINE) 10 MG tablet Take 1 tablet (10 mg total) by mouth every 8 (eight) hours as needed for nausea or vomiting.  . promethazine (PHENERGAN) 25 MG tablet TAKE 1 TABLET BY MOUTH EVERY 8 HOURS AS NEEDED FOR NAUSEA OR VOMITING  . sucralfate (CARAFATE) 1 g tablet TAKE 1 TABLET BY MOUTH 4 TIMES A DAY WITH  MEALS AND AT BEDTIME  . topiramate (TOPAMAX) 50 MG tablet Take 50 mg by mouth 2 (two) times daily.  Marland Kitchen omeprazole (PRILOSEC) 40 MG capsule TAKE 1 CAPSULE BY MOUTH ONCE DAILY. (Patient not taking: No sig reported)  . ondansetron (ZOFRAN-ODT) 4 MG disintegrating tablet Take 1 tablet (4 mg total) by mouth every 8 (eight) hours as needed for nausea or vomiting. (Patient not taking: No sig reported)   No facility-administered medications prior to visit.    Review of Systems  Constitutional: Negative for appetite change, chills and fever.  Respiratory: Negative for chest tightness, shortness of breath and wheezing.   Cardiovascular: Negative for chest pain and palpitations.  Gastrointestinal: Negative for abdominal pain, nausea and vomiting.        Objective    BP 129/78 (BP Location: Right Arm, Patient Position: Sitting, Cuff Size: Large)   Pulse 80   Temp 98.5 F (36.9 C) (Oral)   Resp 18   Wt 173 lb (78.5 kg)   SpO2 99%   BMI 25.55 kg/m  BP Readings from Last 3 Encounters:  06/05/20 129/78  04/25/20 127/67  03/12/20 136/75   Wt Readings from Last 3 Encounters:  06/05/20 173 lb (78.5 kg)  04/25/20 172 lb (78 kg)  03/12/20 188 lb (85.3 kg)       Physical Exam Vitals reviewed.  Constitutional:      Appearance: He is well-developed.  HENT:     Head: Normocephalic and atraumatic.     Right Ear: External ear normal.     Left Ear: External ear normal.     Nose: Nose normal.  Eyes:     General: No scleral icterus.    Conjunctiva/sclera: Conjunctivae normal.  Neck:     Thyroid: No thyromegaly.  Cardiovascular:     Rate and Rhythm: Normal rate and regular rhythm.     Heart sounds: Normal heart sounds.  Pulmonary:     Effort: Pulmonary effort is normal.     Breath sounds: Normal breath sounds.  Abdominal:     General: There is no distension.     Palpations: Abdomen is soft.     Tenderness: There is no abdominal tenderness.  Lymphadenopathy:     Cervical: No cervical  adenopathy.  Skin:    General: Skin is warm and dry.  Neurological:     General: No focal deficit present.     Mental Status: He is alert and oriented to person, place, and time.  Psychiatric:        Mood and Affect: Mood normal.        Behavior: Behavior normal.        Thought Content: Thought content normal.        Judgment: Judgment normal.   CG reveals normal sinus rhythm with a rate of about 70 with nonspecific ST-T wave changes   No results found for any visits on 06/05/20.  Assessment & Plan     1. Chest pain, unspecified type I am worried this chest pain is a mass in the right side of the chest.  CT scan pending. I do not think this is cardiac at this time. - EKG 12-Lead  2. Chronic neck and back pain Patient always gets some relief for a few days from Toradol. - ketorolac (TORADOL) injection 60 mg  3. Pneumonia of right middle lobe due to infectious organism Cover with doxycycline although I think clinically this is not pneumonia. - doxycycline (VIBRA-TABS) 100 MG tablet; Take 1 tablet (100 mg total) by mouth 2 (two) times daily.  Dispense: 14 tablet; Refill: 0  4. Hypogammaglobulinemia ([place])   5. Mild episode of recurrent major depressive disorder (HCC) Stable per  6. Interstitial pneumonia (HCC)  - doxycycline (VIBRA-TABS) 100 MG tablet; Take 1 tablet (100 mg total) by mouth 2 (two) times daily.  Dispense: 14 tablet; Refill: 0    No follow-ups on file.         Desha Bitner Cranford Mon, MD  [facility] [phone] (phone) [phone] (fax)  [place]

## 2020-06-10 ENCOUNTER — Telehealth: Payer: Self-pay

## 2020-06-10 NOTE — Telephone Encounter (Signed)
Copied from [place] [id number]. Topic: General - Other >> Jun 10, 2020  2:24 PM Tessa Lerner A wrote: Reason for CRM: Patient is calling in to inquire about any steps he needs to take to prepare for an upcoming CT scan Patient has an appointment to be scanned on 06/14/20 at 3:00PM Patient believes he may have a fluid that he needs to drink before the scan but is unsure Please contact to advise

## 2020-06-13 NOTE — Telephone Encounter (Signed)
Just needs to show up.

## 2020-06-13 NOTE — Telephone Encounter (Signed)
Left detailed message on pt's vm advising below.

## 2020-06-14 ENCOUNTER — Ambulatory Visit
Admission: RE | Admit: 2020-06-14 | Discharge: 2020-06-14 | Disposition: A | Discharge status: Home / Self Care | Payer: PPO | Source: Ambulatory Visit | Attending: Family Medicine | Admitting: Family Medicine

## 2020-06-14 ENCOUNTER — Other Ambulatory Visit: Payer: Self-pay

## 2020-06-14 DIAGNOSIS — R0789 Other chest pain: Secondary | ICD-10-CM | POA: Diagnosis not present

## 2020-06-14 DIAGNOSIS — R911 Solitary pulmonary nodule: Secondary | ICD-10-CM | POA: Diagnosis not present

## 2020-06-17 ENCOUNTER — Other Ambulatory Visit: Payer: Self-pay

## 2020-06-17 ENCOUNTER — Telehealth: Payer: Self-pay | Admitting: *Deleted

## 2020-06-17 DIAGNOSIS — R918 Other nonspecific abnormal finding of lung field: Secondary | ICD-10-CM

## 2020-06-17 NOTE — Telephone Encounter (Signed)
Please review. Order has already been placed for Dr. Genevive Bi at [facility] as you requested.

## 2020-06-17 NOTE — Progress Notes (Signed)
Order placed per Dr. Rosanna Randy.

## 2020-06-17 NOTE — Telephone Encounter (Signed)
[name] calling with results: IMPRESSION: 1. Irregular masslike focus of consolidation centered in the anteromedial basilar right upper lobe measuring 5.2 x 3.0 cm, with some contiguous involvement on the other side of the minor fissure in the anteromedial right middle lobe. Compared to 04/25/2020 chest radiograph, the right middle lobe consolidation appears improved, however the right upper lobe portion of the consolidation appears new/increased. Primary pulmonary neoplasm cannot be excluded in this high risk patient. Management options include PET-CT for further characterization, tissue sampling or continued close chest CT follow-up, as clinically warranted. 2. Mild right hilar lymphadenopathy, nonspecific. 3. Indeterminate chronic lobulated solid partially calcified 7.4 cm left upper quadrant mass at the anterior inferior margin of the spleen, mildly increased from 7.0 cm on 2020 CT and 5.5 cm on remote 2009 CT. Suggest surgical consultation given continued growth, worrisome for neoplasm of uncertain malignant potential. 4. Two-vessel coronary atherosclerosis. 5. Aortic Atherosclerosis (ICD10-I70.0) and Emphysema (ICD10-J43.9).

## 2020-06-18 ENCOUNTER — Ambulatory Visit (INDEPENDENT_AMBULATORY_CARE_PROVIDER_SITE_OTHER): Payer: PPO | Admitting: Family Medicine

## 2020-06-18 ENCOUNTER — Other Ambulatory Visit: Payer: Self-pay

## 2020-06-18 ENCOUNTER — Encounter: Payer: Self-pay | Admitting: Family Medicine

## 2020-06-18 VITALS — BP 128/75 | HR 63 | Temp 98.9°F | Resp 18 | Ht 69.0 in | Wt 177.0 lb

## 2020-06-18 DIAGNOSIS — R161 Splenomegaly, not elsewhere classified: Secondary | ICD-10-CM

## 2020-06-18 DIAGNOSIS — R222 Localized swelling, mass and lump, trunk: Secondary | ICD-10-CM | POA: Diagnosis not present

## 2020-06-18 NOTE — Progress Notes (Signed)
I,[name],acting as a scribe for [name], MD.,have documented all relevant documentation on the behalf of [name], MD,as directed by  [name], MD while in the presence of [name], MD.   Established patient visit   Patient: Joshua Guerrero   DOB: 1955/06/28   65 y.o. Male  MRN: [id number] Visit Date: 06/18/2020  Today's healthcare provider: [name], MD   Chief Complaint  Patient presents with  . Follow-up   Subjective    HPI  She comes in today for follow-up of chest pain.  He has what appears to be a right chest mass and a possible splenic mass.  The pain continues but is not bad.  He already sees pain management for his chronic back pain. No fever chills weight loss.  He continues to smoke about a fourth of a pack of cigarettes per day. Chest pain, unspecified type From 06/05/2020-I am worried this chest pain is a mass in the right side of the chest.  CT scan pending. I do not think this is cardiac at this time. CT scan completed.      Medications: Outpatient Medications Prior to Visit  Medication Sig  . baclofen (LIORESAL) 10 MG tablet Take 5 mg by mouth 4 (four) times daily. STARTING AT LUNCHTIME  . desvenlafaxine (PRISTIQ) 100 MG 24 hr tablet Take 1 tablet (100 mg total) by mouth daily.  . fentaNYL (DURAGESIC - DOSED MCG/HR) 25 MCG/HR patch   . hydrochlorothiazide (HYDRODIURIL) 25 MG tablet Take 1 tablet (25 mg total) by mouth daily.  . hydrOXYzine (ATARAX/VISTARIL) 50 MG tablet TAKE 1 TABLET BY MOUTH 3 TIMES DAILY AS NEEDED (USE AS NEEDED FOR SLEEP)  . lansoprazole (PREVACID) 30 MG capsule TAKE ONE CAPSULE BY MOUTH TWICE A DAY BEFORE A MEAL  . metoprolol succinate (TOPROL-XL) 50 MG 24 hr tablet TAKE 1 TABLET BY MOUTH DAILY WITH OR IMMEDIATELY FOLLOWING A MEAL  . oxyCODONE-acetaminophen (PERCOCET) 10-325 MG tablet Take 1 tablet by mouth every 4 (four) hours. 3AM, 7AM, 11AM, 3 PM, 7PM AND 11PM  . prochlorperazine  (COMPAZINE) 10 MG tablet Take 1 tablet (10 mg total) by mouth every 8 (eight) hours as needed for nausea or vomiting.  . promethazine (PHENERGAN) 25 MG tablet TAKE 1 TABLET BY MOUTH EVERY 8 HOURS AS NEEDED FOR NAUSEA OR VOMITING  . sucralfate (CARAFATE) 1 g tablet TAKE 1 TABLET BY MOUTH 4 TIMES A DAY WITH MEALS AND AT BEDTIME  . topiramate (TOPAMAX) 50 MG tablet Take 50 mg by mouth 2 (two) times daily.  Marland Kitchen doxycycline (VIBRA-TABS) 100 MG tablet Take 1 tablet (100 mg total) by mouth 2 (two) times daily. (Patient not taking: Reported on 06/18/2020)  . omeprazole (PRILOSEC) 40 MG capsule TAKE 1 CAPSULE BY MOUTH ONCE DAILY. (Patient not taking: No sig reported)  . ondansetron (ZOFRAN-ODT) 4 MG disintegrating tablet Take 1 tablet (4 mg total) by mouth every 8 (eight) hours as needed for nausea or vomiting. (Patient not taking: No sig reported)   No facility-administered medications prior to visit.    Review of Systems  Constitutional: Negative for appetite change, chills and fever.  Respiratory: Negative for chest tightness, shortness of breath and wheezing.   Cardiovascular: Negative for chest pain and palpitations.  Gastrointestinal: Negative for abdominal pain, nausea and vomiting.        Objective    BP 128/75 (BP Location: Right Arm, Patient Position: Sitting, Cuff Size: Large)   Pulse 63  Temp 98.9 F (37.2 C) (Oral)   Resp 18   Ht 5\' 9"  (1.753 m)   Wt 177 lb (80.3 kg)   SpO2 97%   BMI 26.14 kg/m  BP Readings from Last 3 Encounters:  06/21/20 (!) 145/76  06/18/20 128/75  06/05/20 129/78   Wt Readings from Last 3 Encounters:  06/21/20 176 lb 4.8 oz (80 kg)  06/18/20 177 lb (80.3 kg)  06/05/20 173 lb (78.5 kg)       Physical Exam Vitals reviewed.  Constitutional:      Appearance: He is well-developed.  HENT:     Head: Normocephalic and atraumatic.     Right Ear: External ear normal.     Left Ear: External ear normal.     Nose: Nose normal.  Eyes:     General: No  scleral icterus.    Conjunctiva/sclera: Conjunctivae normal.  Neck:     Thyroid: No thyromegaly.  Cardiovascular:     Rate and Rhythm: Normal rate and regular rhythm.     Heart sounds: Normal heart sounds.  Pulmonary:     Effort: Pulmonary effort is normal.     Breath sounds: Normal breath sounds.  Abdominal:     General: There is no distension.     Palpations: Abdomen is soft.     Tenderness: There is no abdominal tenderness.  Lymphadenopathy:     Cervical: No cervical adenopathy.  Skin:    General: Skin is warm and dry.  Neurological:     General: No focal deficit present.     Mental Status: He is alert and oriented to person, place, and time.  Psychiatric:        Mood and Affect: Mood normal.        Behavior: Behavior normal.        Thought Content: Thought content normal.        Judgment: Judgment normal.       No results found for any visits on 06/18/20.  Assessment & Plan     1. Mass in chest Referral to thoracic surgery also.  Medically he was treated for pneumonia last month. Patient advised to quit smoking. - Ambulatory referral to Oncology  2. Splenic mass Defer to oncology regarding PET scan. - MR Abdomen W Wo Contrast - Ambulatory referral to Oncology   No follow-ups on file.      I, [name], MD, have reviewed all documentation for this visit. The documentation on 06/21/20 for the exam, diagnosis, procedures, and orders are all accurate and complete.    Reeda Soohoo Cranford Mon, MD  [facility] [phone] (phone) [phone] (fax)  [place]

## 2020-06-21 ENCOUNTER — Encounter (INDEPENDENT_AMBULATORY_CARE_PROVIDER_SITE_OTHER): Payer: Self-pay

## 2020-06-21 ENCOUNTER — Encounter: Payer: Self-pay | Admitting: Oncology

## 2020-06-21 ENCOUNTER — Inpatient Hospital Stay: Payer: PPO

## 2020-06-21 ENCOUNTER — Inpatient Hospital Stay: Payer: PPO | Attending: Oncology | Admitting: Oncology

## 2020-06-21 VITALS — BP 145/76 | HR 75 | Temp 99.5°F | Resp 18 | Ht 69.09 in | Wt 176.3 lb

## 2020-06-21 DIAGNOSIS — R918 Other nonspecific abnormal finding of lung field: Secondary | ICD-10-CM | POA: Insufficient documentation

## 2020-06-21 DIAGNOSIS — D509 Iron deficiency anemia, unspecified: Secondary | ICD-10-CM | POA: Diagnosis not present

## 2020-06-21 DIAGNOSIS — R161 Splenomegaly, not elsewhere classified: Secondary | ICD-10-CM | POA: Diagnosis not present

## 2020-06-21 DIAGNOSIS — Z809 Family history of malignant neoplasm, unspecified: Secondary | ICD-10-CM | POA: Insufficient documentation

## 2020-06-21 DIAGNOSIS — Z720 Tobacco use: Secondary | ICD-10-CM

## 2020-06-21 DIAGNOSIS — F17210 Nicotine dependence, cigarettes, uncomplicated: Secondary | ICD-10-CM | POA: Insufficient documentation

## 2020-06-21 LAB — COMPREHENSIVE METABOLIC PANEL
ALT: 7 U/L (ref 0–44)
AST: 9 U/L — ABNORMAL LOW (ref 15–41)
Albumin: 3.5 g/dL (ref 3.5–5.0)
Alkaline Phosphatase: 56 U/L (ref 38–126)
Anion gap: 10 (ref 5–15)
BUN: 13 mg/dL (ref 8–23)
CO2: 25 mmol/L (ref 22–32)
Calcium: 8.8 mg/dL — ABNORMAL LOW (ref 8.9–10.3)
Chloride: 99 mmol/L (ref 98–111)
Creatinine, Ser: 0.69 mg/dL (ref 0.61–1.24)
GFR, Estimated: >60 mL/min (ref >60)
Glucose, Bld: 94 mg/dL (ref 70–99)
Potassium: 3.9 mmol/L (ref 3.5–5.1)
Sodium: 134 mmol/L — ABNORMAL LOW (ref 135–145)
Total Bilirubin: 0.3 mg/dL (ref 0.3–1.2)
Total Protein: 7.7 g/dL (ref 6.5–8.1)

## 2020-06-21 LAB — CBC WITH DIFFERENTIAL/PLATELET
Abs Immature Granulocytes: 0.08 10*3/uL — ABNORMAL HIGH (ref 0.00–0.07)
Basophils Absolute: 0.1 10*3/uL (ref 0.0–0.1)
Basophils Relative: 1 %
Eosinophils Absolute: 0.2 10*3/uL (ref 0.0–0.5)
Eosinophils Relative: 2 %
HCT: 36.4 % — ABNORMAL LOW (ref 39.0–52.0)
Hemoglobin: 11.3 g/dL — ABNORMAL LOW (ref 13.0–17.0)
Immature Granulocytes: 1 %
Lymphocytes Relative: 12 %
Lymphs Abs: 1.6 10*3/uL (ref 0.7–4.0)
MCH: 24.2 pg — ABNORMAL LOW (ref 26.0–34.0)
MCHC: 31 g/dL (ref 30.0–36.0)
MCV: 78.1 fL — ABNORMAL LOW (ref 80.0–100.0)
Monocytes Absolute: 1.2 10*3/uL — ABNORMAL HIGH (ref 0.1–1.0)
Monocytes Relative: 9 %
Neutro Abs: 10.1 10*3/uL — ABNORMAL HIGH (ref 1.7–7.7)
Neutrophils Relative %: 75 %
Platelets: 507 10*3/uL — ABNORMAL HIGH (ref 150–400)
RBC: 4.66 MIL/uL (ref 4.22–5.81)
RDW: 19.9 % — ABNORMAL HIGH (ref 11.5–15.5)
WBC: 13.3 10*3/uL — ABNORMAL HIGH (ref 4.0–10.5)
nRBC: 0 % (ref 0.0–0.2)

## 2020-06-21 LAB — LACTATE DEHYDROGENASE: LDH: 84 U/L — ABNORMAL LOW (ref 98–192)

## 2020-06-21 NOTE — Progress Notes (Signed)
Hematology/Oncology Consult note [facility] [phone] [phone]   Patient Care Team: Jerrol Banana., MD as PCP - General (Family Medicine) Jerrol Banana., MD (Family Medicine) Lucilla Lame, MD as Consulting Physician (Gastroenterology) Margaretha Sheffield, MD as Referring Physician (Physical Medicine and Rehabilitation) System, Provider Not In  REFERRING PROVIDER: Jerrol Banana.,*  CHIEF COMPLAINTS/REASON FOR VISIT:  Evaluation of lung mass and spleen mass  HISTORY OF PRESENTING ILLNESS:   Joshua Guerrero is a  65 y.o.  male with PMH listed below was seen in consultation at the request of  Jerrol Banana.,*  for evaluation of lung mass in the spleen mass.  Patient developed a cough and a chest pain in December 2021. 04/25/2020, chest x-ray showed obscuration in the right middle lobe which has some rounded features. Patient was treated by primary care provider for right middle lobe pneumonia with doxycycline. A follow-up CT scan was obtained on 06/16/2020 Irregular masslike focus of consolidation centered in the anteromedial basilar right upper lobe measuring 5.2 x 3.0 with some contiguous involvement on the other side of the major fissure in the right middle lobe. Mild right hilar lymphadenopathy.  Nonspecific. Indeterminate chronic lobulated solid partially calcified 7.4 cm left upper quadrant mass at the anterior inferior margin of the spleen.  Mildly increased in size comparing to 7 cm on 2020 CT and a 5.5 cm on remote 2009 CT.  Two-vessel coronary atherosclerosis.  Aortic atherosclerosis and emphysema.  Current everyday smoker. Patient reports that he has had weight loss due to pneumonia.  Recently has gained 3 pounds back. Appetite is fair.  No night sweating, fever, chills.   Review of Systems  Constitutional: Positive for fatigue. Negative for appetite change, chills, fever and unexpected weight change.   HENT:   Negative for hearing loss and voice change.   Eyes: Negative for eye problems and icterus.  Respiratory: Negative for chest tightness, cough and shortness of breath.   Cardiovascular: Negative for chest pain and leg swelling.  Gastrointestinal: Negative for abdominal distention and abdominal pain.  Endocrine: Negative for hot flashes.  Genitourinary: Negative for difficulty urinating, dysuria and frequency.   Musculoskeletal: Negative for arthralgias.  Skin: Negative for itching and rash.  Neurological: Negative for light-headedness and numbness.  Hematological: Negative for adenopathy. Does not bruise/bleed easily.  Psychiatric/Behavioral: Negative for confusion.    MEDICAL HISTORY:  Past Medical History:  Diagnosis Date  . Arthritis    lower back  . Brain bleed ([place]) 10/2016   AT [facility]-TREATED MEDICALLY NO SURGERY  . Confusion caused by a drug    fentanyl and oxycodone  . GERD (gastroesophageal reflux disease)   . Headache    past hx, secondary to nerve damage from accident  . Hypertension    CONTROLLED ON MEDS  . Neck pain, chronic   . Neuromuscular disorder ([place])    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  . Sleep apnea    Not currently using a C-Pap    SURGICAL HISTORY: Past Surgical History:  Procedure Laterality Date  . COLONOSCOPY WITH PROPOFOL N/A 02/11/2015   Procedure: COLONOSCOPY WITH PROPOFOL;  Surgeon: Lucilla Lame, MD;  Location: [place];  Service: Endoscopy;  Laterality: N/A;  USES C-PAP  . ESOPHAGEAL DILATION N/A 10/14/2018   Procedure: ESOPHAGEAL DILATION;  Surgeon: Lucilla Lame, MD;  Location: [facility];  Service: Endoscopy;  Laterality: N/A;  . ESOPHAGOGASTRODUODENOSCOPY (EGD) WITH PROPOFOL N/A 10/14/2018  Procedure: ESOPHAGOGASTRODUODENOSCOPY (EGD) WITH BIOPSY;  Surgeon: Lucilla Lame, MD;  Location: [place];  Service: Endoscopy;  Laterality: N/A;  sleep apnea  . NASAL SINUS SURGERY     due to severe  sleep apnea  . NECK SURGERY     diffuse disc-and put in possibly screws patient thinks  . SHOULDER SURGERY Bilateral    x 2  . UMBILICAL HERNIA REPAIR N/A 02/26/2017   Primary repair.  Surgeon: Robert Bellow, MD;  Location: [facility];  Service: General;  Laterality: N/A;    SOCIAL HISTORY: Social History   Socioeconomic History  . Marital status: Divorced    Spouse name: single  . Number of children: 0  . Years of education: 78  . Highest education level: High school graduate  Occupational History  . Occupation: disability  Tobacco Use  . Smoking status: Current Every Day Smoker    Packs/day: 0.50    Years: 30.00    Pack years: 15.00    Types: Cigarettes  . Smokeless tobacco: Never Used  . Tobacco comment: currently about 1/2 PPD  Vaping Use  . Vaping Use: Never used  Substance and Sexual Activity  . Alcohol use: No  . Drug use: No  . Sexual activity: Never  Other Topics Concern  . Not on file  Social History Narrative  . Not on file   Social Determinants of Health   Financial Resource Strain: Low Risk   . Difficulty of Paying Living Expenses: Not hard at all  Food Insecurity: No Food Insecurity  . Worried About Charity fundraiser in the Last Year: Never true  . Ran Out of Food in the Last Year: Never true  Transportation Needs: No Transportation Needs  . Lack of Transportation (Medical): No  . Lack of Transportation (Non-Medical): No  Physical Activity: Inactive  . Days of Exercise per Week: 0 days  . Minutes of Exercise per Session: 0 min  Stress: No Stress Concern Present  . Feeling of Stress : Only a little  Social Connections: Moderately Isolated  . Frequency of Communication with Friends and Family: More than three times a week  . Frequency of Social Gatherings with Friends and Family: More than three times a week  . Attends Religious Services: More than 4 times per year  . Active Member of Clubs or Organizations: No  . Attends Theatre manager Meetings: Never  . Marital Status: Divorced  Human resources officer Violence: Not At Risk  . Fear of Current or Ex-Partner: No  . Emotionally Abused: No  . Physically Abused: No  . Sexually Abused: No    FAMILY HISTORY: Family History  Problem Relation Age of Onset  . Dementia Father   . Melanoma Mother   . Cancer Paternal Uncle        unknown  . Cancer Paternal Uncle        unknown    ALLERGIES:  is allergic to acetaminophen, codeine, and opana [oxymorphone].  MEDICATIONS:  Current Outpatient Medications  Medication Sig Dispense Refill  . baclofen (LIORESAL) 10 MG tablet Take 5 mg by mouth 4 (four) times daily. STARTING AT LUNCHTIME    . desvenlafaxine (PRISTIQ) 100 MG 24 hr tablet Take 1 tablet (100 mg total) by mouth daily. 90 tablet 1  . fentaNYL (DURAGESIC - DOSED MCG/HR) 25 MCG/HR patch     . hydrochlorothiazide (HYDRODIURIL) 25 MG tablet Take 1 tablet (25 mg total) by mouth daily. 90 tablet 1  . hydrOXYzine (ATARAX/VISTARIL) 50  MG tablet TAKE 1 TABLET BY MOUTH 3 TIMES DAILY AS NEEDED (USE AS NEEDED FOR SLEEP) 90 tablet 1  . lansoprazole (PREVACID) 30 MG capsule TAKE ONE CAPSULE BY MOUTH TWICE A DAY BEFORE A MEAL 180 capsule 3  . metoprolol succinate (TOPROL-XL) 50 MG 24 hr tablet TAKE 1 TABLET BY MOUTH DAILY WITH OR IMMEDIATELY FOLLOWING A MEAL 90 tablet 1  . oxyCODONE-acetaminophen (PERCOCET) 10-325 MG tablet Take 1 tablet by mouth every 4 (four) hours. 3AM, 7AM, 11AM, 3 PM, 7PM AND 11PM    . prochlorperazine (COMPAZINE) 10 MG tablet Take 1 tablet (10 mg total) by mouth every 8 (eight) hours as needed for nausea or vomiting. 90 tablet 2  . sucralfate (CARAFATE) 1 g tablet TAKE 1 TABLET BY MOUTH 4 TIMES A DAY WITH MEALS AND AT BEDTIME 120 tablet 11  . topiramate (TOPAMAX) 50 MG tablet Take 50 mg by mouth 2 (two) times daily.    Marland Kitchen doxycycline (VIBRA-TABS) 100 MG tablet Take 1 tablet (100 mg total) by mouth 2 (two) times daily. (Patient not taking: No sig reported) 14  tablet 0  . omeprazole (PRILOSEC) 40 MG capsule TAKE 1 CAPSULE BY MOUTH ONCE DAILY. (Patient not taking: No sig reported) 90 capsule 0  . ondansetron (ZOFRAN-ODT) 4 MG disintegrating tablet Take 1 tablet (4 mg total) by mouth every 8 (eight) hours as needed for nausea or vomiting. (Patient not taking: No sig reported) 60 tablet 5  . promethazine (PHENERGAN) 25 MG tablet TAKE 1 TABLET BY MOUTH EVERY 8 HOURS AS NEEDED FOR NAUSEA OR VOMITING (Patient not taking: Reported on 06/21/2020) 60 tablet 3   No current facility-administered medications for this visit.     PHYSICAL EXAMINATION: ECOG PERFORMANCE STATUS: 0 - Asymptomatic Vitals:   06/21/20 1118  BP: (!) 145/76  Pulse: 75  Resp: 18  Temp: 99.5 F (37.5 C)   Filed Weights   06/21/20 1118  Weight: 176 lb 4.8 oz (80 kg)    Physical Exam Constitutional:      General: He is not in acute distress. HENT:     Head: Normocephalic and atraumatic.  Eyes:     General: No scleral icterus. Cardiovascular:     Rate and Rhythm: Normal rate and regular rhythm.     Heart sounds: Normal heart sounds.  Pulmonary:     Effort: Pulmonary effort is normal. No respiratory distress.     Breath sounds: No wheezing.     Comments: Decreased breath sounds bilaterally Abdominal:     General: Bowel sounds are normal. There is no distension.     Palpations: Abdomen is soft.  Musculoskeletal:        General: No deformity. Normal range of motion.     Cervical back: Normal range of motion and neck supple.  Skin:    General: Skin is warm and dry.     Findings: No erythema or rash.  Neurological:     Mental Status: He is alert and oriented to person, place, and time. Mental status is at baseline.     Cranial Nerves: No cranial nerve deficit.     Coordination: Coordination normal.  Psychiatric:        Mood and Affect: Mood normal.     LABORATORY DATA:  I have reviewed the data as listed Lab Results  Component Value Date   WBC 13.3 (H) 06/21/2020    HGB 11.3 (L) 06/21/2020   HCT 36.4 (L) 06/21/2020   MCV 78.1 (L) 06/21/2020   PLT  507 (H) 06/21/2020   Recent Labs    06/21/20 1159  NA 134*  K 3.9  CL 99  CO2 25  GLUCOSE 94  BUN 13  CREATININE 0.69  CALCIUM 8.8*  GFRNONAA >60  PROT 7.7  ALBUMIN 3.5  AST 9*  ALT 7  ALKPHOS 56  BILITOT 0.3   Iron/TIBC/Ferritin/ %Sat No results found for: IRON, TIBC, FERRITIN, IRONPCTSAT    RADIOGRAPHIC STUDIES: I have personally reviewed the radiological images as listed and agreed with the findings in the report. CT Chest Wo Contrast  Result Date: 06/16/2020 CLINICAL DATA:  Cough and right chest pain for 3 months. Abnormal chest radiograph. Current smoker. EXAM: CT CHEST WITHOUT CONTRAST TECHNIQUE: Multidetector CT imaging of the chest was performed following the standard protocol without IV contrast. COMPARISON:  04/25/2020 chest radiograph. FINDINGS: Cardiovascular: Normal heart size. No significant pericardial effusion/thickening. Left anterior descending and right coronary atherosclerosis. Atherosclerotic nonaneurysmal thoracic aorta. Normal caliber pulmonary arteries. Mediastinum/Nodes: No discrete thyroid nodules. Unremarkable esophagus. No axillary adenopathy. No pathologically enlarged mediastinal nodes. Mildly enlarged 1.1 cm right hilar node (series 2/image 59). No discrete left hilar adenopathy on these noncontrast images. Lungs/Pleura: No pneumothorax. No pleural effusion. Mild centrilobular and paraseptal emphysema. Irregular masslike focus of consolidation centered in the anteromedial basilar right upper lobe measuring 5.2 x 3.0 cm (series 3/image 58), abutting the anterior and mediastinal pleura with some contiguous involvement on the other side of the minor fissure in the anteromedial right middle lobe. Compared to 04/25/2020 chest radiograph, the right middle lobe portion of the consolidation appears improved however the right upper lobe portion of the consolidation appears  new/increased. No additional significant pulmonary nodules. Faint patchy ground-glass opacity in the basilar right upper lobe. Upper abdomen: Exophytic simple 1.7 cm upper right renal cyst. There is a lobulated 7.4 x 6.8 cm left upper quadrant solid heterogeneous mass with scattered coarse internal calcifications (series 2/image 149), situated along the anterior inferior margin of the spleen and abutting the posterior aspect of the pancreatic tail, previously 7.0 x 5.3 cm on 07/18/2018 CT and 5.5 x 4.7 cm on remote 06/29/2007 CT using similar measurement technique, increased. Musculoskeletal: No aggressive appearing focal osseous lesions. Mild thoracic spondylosis. Partially visualized surgical hardware from [facility]CD[facility]. IMPRESSION: 1. Irregular masslike focus of consolidation centered in the anteromedial basilar right upper lobe measuring 5.2 x 3.0 cm, with some contiguous involvement on the other side of the minor fissure in the anteromedial right middle lobe. Compared to 04/25/2020 chest radiograph, the right middle lobe consolidation appears improved, however the right upper lobe portion of the consolidation appears new/increased. Primary pulmonary neoplasm cannot be excluded in this high risk patient. Management options include PET-CT for further characterization, tissue sampling or continued close chest CT follow-up, as clinically warranted. 2. Mild right hilar lymphadenopathy, nonspecific. 3. Indeterminate chronic lobulated solid partially calcified 7.4 cm left upper quadrant mass at the anterior inferior margin of the spleen, mildly increased from 7.0 cm on 2020 CT and 5.5 cm on remote 2009 CT. Suggest surgical consultation given continued growth, worrisome for neoplasm of uncertain malignant potential. 4. Two-vessel coronary atherosclerosis. 5. Aortic Atherosclerosis (ICD10-I70.0) and Emphysema (ICD10-J43.9). These results will be called to the ordering clinician or representative by the Radiologist Assistant, and  communication documented in the PACS or [employer]. Electronically Signed   By: Ilona Sorrel M.D.   On: 06/16/2020 09:41      ASSESSMENT & PLAN:  1. Lung mass   2. Splenic mass   3.  Microcytic anemia   4. Tobacco use    #Images were independently reviewed by me and discussed with patient. Lung masslike consolidation, post pneumonia changes vs malignant process, etc. Recommend patient to proceed with PET scan for further evaluation of metabolic activity.  Consider tissue diagnosis if hypermetabolic on PET scan.  #Chronic splenic mass, increasing size.  ?  Pseudocyst vs  other etiology.  Pending PET scan work-up.  Possible need surgical consultation  Check CBC, CMP, LDH, flow cytometry. Tobacco use, smoke cessation was discussed with patient. Microcytic anemia-need to check iron TIBC ferritin.  Last colonoscopy was 02/11/2015 At that time, patient has had an erythematous mucosa in the transverse colon, hepatic flexure, ascending colon and cecum.  Ischemic colitis.  Polyps.  Pathology report was not available to me.  Orders Placed This Encounter  Procedures  . NM PET Image Initial (PI) Skull Base To Thigh    Standing Status:   Future    Standing Expiration Date:   06/21/2021    Order Specific Question:   If indicated for the ordered procedure, I authorize the administration of a radiopharmaceutical per Radiology protocol    Answer:   Yes    Order Specific Question:   Preferred imaging location?    Answer:   [facility]  . CBC with Differential/Platelet    Standing Status:   Future    Number of Occurrences:   1    Standing Expiration Date:   06/21/2021  . Comprehensive metabolic panel    Standing Status:   Future    Number of Occurrences:   1    Standing Expiration Date:   06/21/2021  . Lactate dehydrogenase    Standing Status:   Future    Number of Occurrences:   1    Standing Expiration Date:   06/21/2021  . Flow cytometry panel-leukemia/lymphoma work-up    Standing  Status:   Future    Number of Occurrences:   1    Standing Expiration Date:   06/21/2021    All questions were answered. The patient knows to call the clinic with any problems questions or concerns.  cc Jerrol Banana.,*    Return of visit: 1 to 2 days after PET scan. Thank you for this kind referral and the opportunity to participate in the care of this patient. A copy of today's note is routed to referring provider    Earlie Server, MD, PhD Hematology Oncology [facility] Pager- [phone] 06/21/2020

## 2020-06-21 NOTE — Progress Notes (Signed)
New patient evaluation today.

## 2020-06-25 ENCOUNTER — Other Ambulatory Visit: Payer: Self-pay

## 2020-06-25 ENCOUNTER — Ambulatory Visit
Admission: RE | Admit: 2020-06-25 | Discharge: 2020-06-25 | Disposition: A | Discharge status: Home / Self Care | Payer: PPO | Source: Ambulatory Visit | Attending: Oncology | Admitting: Oncology

## 2020-06-25 DIAGNOSIS — R161 Splenomegaly, not elsewhere classified: Secondary | ICD-10-CM

## 2020-06-25 DIAGNOSIS — R918 Other nonspecific abnormal finding of lung field: Secondary | ICD-10-CM

## 2020-06-25 LAB — COMP PANEL: LEUKEMIA/LYMPHOMA

## 2020-06-25 MED ORDER — FLUDEOXYGLUCOSE F - 18 (FDG) INJECTION: 9.1000 | Freq: Once | INTRAVENOUS | Status: DC | PRN

## 2020-06-27 ENCOUNTER — Other Ambulatory Visit: Payer: Self-pay

## 2020-06-27 ENCOUNTER — Ambulatory Visit
Admission: RE | Admit: 2020-06-27 | Discharge: 2020-06-27 | Disposition: A | Discharge status: Home / Self Care | Payer: PPO | Source: Ambulatory Visit | Attending: Family Medicine | Admitting: Family Medicine

## 2020-06-27 DIAGNOSIS — R161 Splenomegaly, not elsewhere classified: Secondary | ICD-10-CM | POA: Diagnosis not present

## 2020-06-27 DIAGNOSIS — K8689 Other specified diseases of pancreas: Secondary | ICD-10-CM | POA: Diagnosis not present

## 2020-06-27 DIAGNOSIS — D7389 Other diseases of spleen: Secondary | ICD-10-CM | POA: Diagnosis not present

## 2020-06-27 DIAGNOSIS — G894 Chronic pain syndrome: Secondary | ICD-10-CM | POA: Diagnosis not present

## 2020-06-27 DIAGNOSIS — M47812 Spondylosis without myelopathy or radiculopathy, cervical region: Secondary | ICD-10-CM | POA: Diagnosis not present

## 2020-06-27 DIAGNOSIS — M961 Postlaminectomy syndrome, not elsewhere classified: Secondary | ICD-10-CM | POA: Diagnosis not present

## 2020-06-27 DIAGNOSIS — M47817 Spondylosis without myelopathy or radiculopathy, lumbosacral region: Secondary | ICD-10-CM | POA: Diagnosis not present

## 2020-06-27 MED ORDER — GADOBUTROL 1 MMOL/ML IV SOLN
8.0000 mL | Freq: Once | INTRAVENOUS | Status: AC | PRN
  Administered 2020-06-27: 8 mL via INTRAVENOUS

## 2020-06-28 ENCOUNTER — Inpatient Hospital Stay: Payer: PPO | Admitting: Oncology

## 2020-07-10 ENCOUNTER — Ambulatory Visit: Payer: Self-pay | Admitting: Family Medicine

## 2020-07-11 ENCOUNTER — Ambulatory Visit
Admission: RE | Admit: 2020-07-11 | Discharge: 2020-07-11 | Disposition: A | Discharge status: Home / Self Care | Payer: PPO | Source: Ambulatory Visit | Attending: Oncology | Admitting: Oncology

## 2020-07-11 ENCOUNTER — Other Ambulatory Visit: Payer: Self-pay

## 2020-07-11 DIAGNOSIS — R918 Other nonspecific abnormal finding of lung field: Secondary | ICD-10-CM | POA: Diagnosis not present

## 2020-07-11 DIAGNOSIS — R161 Splenomegaly, not elsewhere classified: Secondary | ICD-10-CM | POA: Insufficient documentation

## 2020-07-11 DIAGNOSIS — I2510 Atherosclerotic heart disease of native coronary artery without angina pectoris: Secondary | ICD-10-CM | POA: Insufficient documentation

## 2020-07-11 DIAGNOSIS — J439 Emphysema, unspecified: Secondary | ICD-10-CM | POA: Insufficient documentation

## 2020-07-11 DIAGNOSIS — I700 Atherosclerosis of aorta: Secondary | ICD-10-CM | POA: Insufficient documentation

## 2020-07-11 LAB — GLUCOSE, CAPILLARY: Glucose-Capillary: 107 mg/dL — ABNORMAL HIGH (ref 70–99)

## 2020-07-11 MED ORDER — FLUDEOXYGLUCOSE F - 18 (FDG) INJECTION
9.1000 | Freq: Once | INTRAVENOUS | Status: AC | PRN
  Administered 2020-07-11: 9.19 via INTRAVENOUS

## 2020-07-15 ENCOUNTER — Inpatient Hospital Stay: Payer: PPO | Attending: Oncology | Admitting: Oncology

## 2020-07-15 ENCOUNTER — Other Ambulatory Visit: Payer: Self-pay

## 2020-07-15 ENCOUNTER — Encounter: Payer: Self-pay | Admitting: Oncology

## 2020-07-15 VITALS — BP 144/81 | HR 68 | Temp 98.7°F | Resp 16 | Wt 170.4 lb

## 2020-07-15 DIAGNOSIS — D509 Iron deficiency anemia, unspecified: Secondary | ICD-10-CM | POA: Diagnosis not present

## 2020-07-15 DIAGNOSIS — R918 Other nonspecific abnormal finding of lung field: Secondary | ICD-10-CM | POA: Insufficient documentation

## 2020-07-15 DIAGNOSIS — I10 Essential (primary) hypertension: Secondary | ICD-10-CM | POA: Diagnosis not present

## 2020-07-15 DIAGNOSIS — R161 Splenomegaly, not elsewhere classified: Secondary | ICD-10-CM | POA: Diagnosis not present

## 2020-07-15 DIAGNOSIS — Z720 Tobacco use: Secondary | ICD-10-CM | POA: Diagnosis not present

## 2020-07-15 DIAGNOSIS — R590 Localized enlarged lymph nodes: Secondary | ICD-10-CM | POA: Insufficient documentation

## 2020-07-15 DIAGNOSIS — Z79899 Other long term (current) drug therapy: Secondary | ICD-10-CM | POA: Diagnosis not present

## 2020-07-15 DIAGNOSIS — F17210 Nicotine dependence, cigarettes, uncomplicated: Secondary | ICD-10-CM | POA: Insufficient documentation

## 2020-07-15 NOTE — Progress Notes (Signed)
Hematology/Oncology Consult note [facility] Telephone:([phone] [phone]   Patient Care Team: Jerrol Banana., MD as PCP - General (Family Medicine) Jerrol Banana., MD (Family Medicine) Lucilla Lame, MD as Consulting Physician (Gastroenterology) Margaretha Sheffield, MD as Referring Physician (Physical Medicine and Rehabilitation) System, Provider Not In  REFERRING PROVIDER: Jerrol Banana.,*  CHIEF COMPLAINTS/REASON FOR VISIT:  Evaluation of lung mass and spleen mass  HISTORY OF PRESENTING ILLNESS:   Joshua Guerrero is a  65 y.o.  male with PMH listed below was seen in consultation at the request of  Jerrol Banana.,*  for evaluation of lung mass in the spleen mass.  Patient developed a cough and a chest pain in December 2021. 04/25/2020, chest x-ray showed obscuration in the right middle lobe which has some rounded features. Patient was treated by primary care provider for right middle lobe pneumonia with doxycycline. A follow-up CT scan was obtained on 06/16/2020 Irregular masslike focus of consolidation centered in the anteromedial basilar right upper lobe measuring 5.2 x 3.0 with some contiguous involvement on the other side of the major fissure in the right middle lobe. Mild right hilar lymphadenopathy.  Nonspecific. Indeterminate chronic lobulated solid partially calcified 7.4 cm left upper quadrant mass at the anterior inferior margin of the spleen.  Mildly increased in size comparing to 7 cm on 2020 CT and a 5.5 cm on remote 2009 CT.  Two-vessel coronary atherosclerosis.  Aortic atherosclerosis and emphysema.  Current everyday smoker. Patient reports that he has had weight loss due to pneumonia.  Recently has gained 3 pounds back. Appetite is fair.  No night sweating, fever, chills.   INTERVAL HISTORY Joshua Guerrero is a 65 y.o. male who has above history reviewed by me today presents for follow up visit for  management of lung mass and spleen mass. Problems and complaints are listed below: Patient had a PET scan done during interval.  Accompanied by daughter today to discuss imaging results Continues to have right side of pain.  On fentanyl patch.  He rates the pain a 6 out of 10. Chronic cough.  Denies any hemoptysis, shortness of breath. He has lost 6 pounds since last visit.  He attributes to decreased appetite due to anxiety associated with diagnosis.  Review of Systems  Constitutional: Positive for fatigue. Negative for appetite change, chills, fever and unexpected weight change.  HENT:   Negative for hearing loss and voice change.   Eyes: Negative for eye problems and icterus.  Respiratory: Positive for cough. Negative for chest tightness and shortness of breath.   Cardiovascular: Negative for chest pain and leg swelling.  Gastrointestinal: Negative for abdominal distention and abdominal pain.  Endocrine: Negative for hot flashes.  Genitourinary: Negative for difficulty urinating, dysuria and frequency.   Musculoskeletal: Negative for arthralgias.  Skin: Negative for itching and rash.  Neurological: Negative for light-headedness and numbness.  Hematological: Negative for adenopathy. Does not bruise/bleed easily.  Psychiatric/Behavioral: Negative for confusion.    MEDICAL HISTORY:  Past Medical History:  Diagnosis Date  . Arthritis    lower back  . Brain bleed ([place]) 10/2016   AT [facility]-TREATED MEDICALLY NO SURGERY  . Confusion caused by a drug    fentanyl and oxycodone  . GERD (gastroesophageal reflux disease)   . Headache    past hx, secondary to nerve damage from accident  . Hypertension    CONTROLLED ON MEDS  . Neck pain, chronic   . Neuromuscular  disorder ([place])    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  . Sleep apnea    Not currently using a C-Pap    SURGICAL HISTORY: Past Surgical History:  Procedure Laterality Date  . COLONOSCOPY WITH PROPOFOL N/A  02/11/2015   Procedure: COLONOSCOPY WITH PROPOFOL;  Surgeon: Lucilla Lame, MD;  Location: [place];  Service: Endoscopy;  Laterality: N/A;  USES C-PAP  . ESOPHAGEAL DILATION N/A 10/14/2018   Procedure: ESOPHAGEAL DILATION;  Surgeon: Lucilla Lame, MD;  Location: [place];  Service: Endoscopy;  Laterality: N/A;  . ESOPHAGOGASTRODUODENOSCOPY (EGD) WITH PROPOFOL N/A 10/14/2018   Procedure: ESOPHAGOGASTRODUODENOSCOPY (EGD) WITH BIOPSY;  Surgeon: Lucilla Lame, MD;  Location: [place];  Service: Endoscopy;  Laterality: N/A;  sleep apnea  . NASAL SINUS SURGERY     due to severe sleep apnea  . NECK SURGERY     diffuse disc-and put in possibly screws patient thinks  . SHOULDER SURGERY Bilateral    x 2  . UMBILICAL HERNIA REPAIR N/A 02/26/2017   Primary repair.  Surgeon: Robert Bellow, MD;  Location: [facility];  Service: General;  Laterality: N/A;    SOCIAL HISTORY: Social History   Socioeconomic History  . Marital status: Divorced    Spouse name: single  . Number of children: 0  . Years of education: 80  . Highest education level: High school graduate  Occupational History  . Occupation: disability  Tobacco Use  . Smoking status: Current Every Day Smoker    Packs/day: 0.50    Years: 30.00    Pack years: 15.00    Types: Cigarettes  . Smokeless tobacco: Never Used  . Tobacco comment: currently about 1/2 PPD  Vaping Use  . Vaping Use: Never used  Substance and Sexual Activity  . Alcohol use: No  . Drug use: No  . Sexual activity: Never  Other Topics Concern  . Not on file  Social History Narrative  . Not on file   Social Determinants of Health   Financial Resource Strain: Low Risk   . Difficulty of Paying Living Expenses: Not hard at all  Food Insecurity: No Food Insecurity  . Worried About Charity fundraiser in the Last Year: Never true  . Ran Out of Food in the Last Year: Never true  Transportation Needs: No Transportation Needs  . Lack of  Transportation (Medical): No  . Lack of Transportation (Non-Medical): No  Physical Activity: Inactive  . Days of Exercise per Week: 0 days  . Minutes of Exercise per Session: 0 min  Stress: No Stress Concern Present  . Feeling of Stress : Only a little  Social Connections: Moderately Isolated  . Frequency of Communication with Friends and Family: More than three times a week  . Frequency of Social Gatherings with Friends and Family: More than three times a week  . Attends Religious Services: More than 4 times per year  . Active Member of Clubs or Organizations: No  . Attends Archivist Meetings: Never  . Marital Status: Divorced  Human resources officer Violence: Not At Risk  . Fear of Current or Ex-Partner: No  . Emotionally Abused: No  . Physically Abused: No  . Sexually Abused: No    FAMILY HISTORY: Family History  Problem Relation Age of Onset  . Dementia Father   . Melanoma Mother   . Cancer Paternal Uncle        unknown  . Cancer Paternal Uncle  unknown    ALLERGIES:  is allergic to acetaminophen, codeine, and opana [oxymorphone].  MEDICATIONS:  Current Outpatient Medications  Medication Sig Dispense Refill  . baclofen (LIORESAL) 10 MG tablet Take 5 mg by mouth 4 (four) times daily. STARTING AT LUNCHTIME    . desvenlafaxine (PRISTIQ) 100 MG 24 hr tablet Take 1 tablet (100 mg total) by mouth daily. 90 tablet 1  . fentaNYL (DURAGESIC - DOSED MCG/HR) 25 MCG/HR patch     . hydrochlorothiazide (HYDRODIURIL) 25 MG tablet Take 1 tablet (25 mg total) by mouth daily. 90 tablet 1  . hydrOXYzine (ATARAX/VISTARIL) 50 MG tablet TAKE 1 TABLET BY MOUTH 3 TIMES DAILY AS NEEDED (USE AS NEEDED FOR SLEEP) 90 tablet 1  . lansoprazole (PREVACID) 30 MG capsule TAKE ONE CAPSULE BY MOUTH TWICE A DAY BEFORE A MEAL 180 capsule 3  . metoprolol succinate (TOPROL-XL) 50 MG 24 hr tablet TAKE 1 TABLET BY MOUTH DAILY WITH OR IMMEDIATELY FOLLOWING A MEAL 90 tablet 1  .  oxyCODONE-acetaminophen (PERCOCET) 10-325 MG tablet Take 1 tablet by mouth every 4 (four) hours. 3AM, 7AM, 11AM, 3 PM, 7PM AND 11PM    . prochlorperazine (COMPAZINE) 10 MG tablet Take 1 tablet (10 mg total) by mouth every 8 (eight) hours as needed for nausea or vomiting. 90 tablet 2  . promethazine (PHENERGAN) 25 MG tablet TAKE 1 TABLET BY MOUTH EVERY 8 HOURS AS NEEDED FOR NAUSEA OR VOMITING 60 tablet 3  . sucralfate (CARAFATE) 1 g tablet TAKE 1 TABLET BY MOUTH 4 TIMES A DAY WITH MEALS AND AT BEDTIME 120 tablet 11  . topiramate (TOPAMAX) 50 MG tablet Take 50 mg by mouth 2 (two) times daily.    Marland Kitchen doxycycline (VIBRA-TABS) 100 MG tablet Take 1 tablet (100 mg total) by mouth 2 (two) times daily. (Patient not taking: No sig reported) 14 tablet 0  . omeprazole (PRILOSEC) 40 MG capsule TAKE 1 CAPSULE BY MOUTH ONCE DAILY. (Patient not taking: No sig reported) 90 capsule 0  . ondansetron (ZOFRAN-ODT) 4 MG disintegrating tablet Take 1 tablet (4 mg total) by mouth every 8 (eight) hours as needed for nausea or vomiting. (Patient not taking: No sig reported) 60 tablet 5   No current facility-administered medications for this visit.     PHYSICAL EXAMINATION: ECOG PERFORMANCE STATUS: 1 - Symptomatic but completely ambulatory Vitals:   07/15/20 1114  BP: (!) 144/81  Pulse: 68  Resp: 16  Temp: 98.7 F (37.1 C)  SpO2: 98%   Filed Weights   07/15/20 1114  Weight: 170 lb 6.4 oz (77.3 kg)    Physical Exam Constitutional:      General: He is not in acute distress. HENT:     Head: Normocephalic and atraumatic.  Eyes:     General: No scleral icterus. Cardiovascular:     Rate and Rhythm: Normal rate and regular rhythm.     Heart sounds: Normal heart sounds.  Pulmonary:     Effort: Pulmonary effort is normal. No respiratory distress.     Breath sounds: No wheezing.     Comments: Decreased breath sounds bilaterally Abdominal:     General: Bowel sounds are normal. There is no distension.      Palpations: Abdomen is soft.  Musculoskeletal:        General: No deformity. Normal range of motion.     Cervical back: Normal range of motion and neck supple.  Skin:    General: Skin is warm and dry.     Findings: No  erythema or rash.  Neurological:     Mental Status: He is alert and oriented to person, place, and time. Mental status is at baseline.     Cranial Nerves: No cranial nerve deficit.     Coordination: Coordination normal.  Psychiatric:        Mood and Affect: Mood normal.     LABORATORY DATA:  I have reviewed the data as listed Lab Results  Component Value Date   WBC 13.3 (H) 06/21/2020   HGB 11.3 (L) 06/21/2020   HCT 36.4 (L) 06/21/2020   MCV 78.1 (L) 06/21/2020   PLT 507 (H) 06/21/2020   Recent Labs    06/21/20 1159  NA 134*  K 3.9  CL 99  CO2 25  GLUCOSE 94  BUN 13  CREATININE 0.69  CALCIUM 8.8*  GFRNONAA >60  PROT 7.7  ALBUMIN 3.5  AST 9*  ALT 7  ALKPHOS 56  BILITOT 0.3   Iron/TIBC/Ferritin/ %Sat No results found for: IRON, TIBC, FERRITIN, IRONPCTSAT    RADIOGRAPHIC STUDIES: I have personally reviewed the radiological images as listed and agreed with the findings in the report. MR Abdomen W Wo Contrast  Result Date: 06/28/2020 CLINICAL DATA:  Evaluate splenic mass identified by CT EXAM: MRI ABDOMEN WITHOUT AND WITH CONTRAST TECHNIQUE: Multiplanar multisequence MR imaging of the abdomen was performed both before and after the administration of intravenous contrast. CONTRAST:  83mL GADAVIST GADOBUTROL 1 MMOL/ML IV SOLN COMPARISON:  CT chest, 06/14/2020, CT abdomen pelvis, 07/18/2018, 06/29/2007 FINDINGS: Lower chest: Partially imaged mass and consolidation of the medial right upper lobe (series 16, image 1). Hepatobiliary: No mass or other parenchymal abnormality identified. Pancreas: No mass, inflammatory changes, or other parenchymal abnormality identified. Spleen: Redemonstrated exophytic mass adjacent to the pancreatic tail and splenic hilum,  which appears to arise from the spleen. The component of this lesion that can be clearly distinguished from splenic parenchyma measures 6.3 x 5.6 cm, previously 5.5 x 5.4 cm when measured similar on prior CT dated 07/18/2018, and has been present since examination dated 06/29/2007. This demonstrates heterogeneous internal contrast enhancement and extensive internal septation.Otherwise within normal limits in size and appearance. Adrenals/Urinary Tract: No masses identified. No evidence of hydronephrosis. Stomach/Bowel: Visualized portions within the abdomen are unremarkable. Vascular/Lymphatic: No pathologically enlarged lymph nodes identified. No abdominal aortic aneurysm demonstrated. Other:  None. Musculoskeletal: No suspicious bone lesions identified. IMPRESSION: 1. Redemonstrated exophytic mass adjacent to the pancreatic tail and splenic hilum, which appears to arise from the spleen. The component of this lesion that can be clearly distinguished from splenic parenchyma measures 6.3 x 5.6 cm, previously 5.5 x 5.4 cm when measured similarly on prior CT dated 07/18/2018 and has been present since remote prior examination dated 06/29/2007. This lesion demonstrates heterogeneous internal contrast enhancement and extensive internal septation, appearance and behavior strongly suggesting an atypical hemangioma or venolymphatic malformation. 2. Partially imaged mass and consolidation of the medial right upper lobe, better imaged by prior dedicated examination of the chest. Electronically Signed   By: Eddie Candle M.D.   On: 06/28/2020 16:24   NM PET Image Initial (PI) Skull Base To Thigh  Result Date: 07/12/2020 CLINICAL DATA:  Initial treatment strategy for right-sided lung mass/consolidation. Indeterminate slowly enlarging splenic lesion. EXAM: NUCLEAR MEDICINE PET SKULL BASE TO THIGH TECHNIQUE: 9.2 mCi F-18 FDG was injected intravenously. Full-ring PET imaging was performed from the skull base to thigh after the  radiotracer. CT data was obtained and used for attenuation correction and anatomic localization. Fasting  blood glucose: 107 mg/dl COMPARISON:  Chest CT 06/14/2020.  Abdominal MRI 06/27/2020. FINDINGS: Mediastinal blood pool activity: SUV max 2.3 Liver activity: SUV max NA NECK: No areas of abnormal hypermetabolism. Incidental CT findings: No cervical adenopathy. Bilateral carotid atherosclerosis. CHEST: The right upper lobe process is progressive and hypermetabolic. For example, soft tissue density cephalad to the 06/14/2020 process measures 3.7 x 2.5 cm on 88/3. At the level measured on the prior CT, the consolidation with minimal air bronchograms measures 5.8 x 3.1 cm and a S.U.V. max of 9.9 on 98/3. Compare 5.2 x 3.0 cm at the same level on the prior exam. Low-level hypermetabolism corresponding to previously described right hilar and suprahilar adenopathy. Example at a S.U.V. max of 3.8 on 96/3. Adjacent right paratracheal node measures 8 mm and a S.U.V. max of 2.8 on 93/3. Incidental CT findings: Aortic and coronary artery atherosclerosis. Mild centrilobular emphysema. ABDOMEN/PELVIS: Low-level, mildly heterogeneous activity involves the exophytic splenic mass. Example 5.7 x 6.0 cm and a S.U.V. max of 3.0 on 151/3. No abdominopelvic nodal hypermetabolism. Incidental CT findings: Upper pole right renal 1.5 cm cyst. Abdominal aortic atherosclerosis. Retroaortic left renal vein. Normal adrenal glands. SKELETON: No abnormal marrow activity. Incidental CT findings: Cervical spine fixation. IMPRESSION: 1. Progression of right upper lobe masslike consolidation with concurrent hypermetabolism. Given the extent of progression compared to the prior CT of less than 1 month ago, infection is favored. An aggressive malignancy could look similar. 2. Hypermetabolic right hilar and right mediastinal nodes, likely of the same etiology as the right upper lobe lung process. Potential clinical strategies include sampling versus CT  follow-up at 4-6 weeks after antibiotic therapy. 3. Low-level heterogeneous activity involving the splenic mass, again favoring a benign etiology such as a hemangioma or lymphangioma. 4. Aortic atherosclerosis (ICD10-I70.0), coronary artery atherosclerosis and emphysema (ICD10-J43.9). Electronically Signed   By: Abigail Miyamoto M.D.   On: 07/12/2020 15:58      ASSESSMENT & PLAN:  1. Lung mass   2. Splenic mass   3. Microcytic anemia   4. Tobacco use    #Lung mass PET images were independently reviewed by me and discussed with patient Progression of right upper lobe masslike consolidation with concurrent hypermetabolic activity. Infectious-obstructive pneumonia versus progressive malignancy. Hypermetabolic right hilar and right mediastinal nodes. Patient has an appointment with Dr. Faith Rogue. Discussed with patient that I recommend patient to establish care with pulmonology for evaluation of feasibility of bronchoscopy/tissue diagnosis.  #Splenic mass, low-level heterogeneous activity involving the splenic mass.  Favoring benign etiology such as hemangioma or lymphangioma.  I will discussed his case on tumor board.  #Unintentional weight loss, pending above work-up. # Tobacco use, smoke cessation  Microcytic anemia-need to check iron TIBC ferritin.  Last colonoscopy was 02/11/2015 At that time, patient has had an erythematous mucosa in the transverse colon, hepatic flexure, ascending colon and cecum.  Ischemic colitis.  Polyps.  Pathology report was not available to me. \  All questions were answered. The patient knows to call the clinic with any problems questions or concerns.  cc Jerrol Banana.,*    Return of visit: To be determined.   Earlie Server, MD, PhD Hematology Oncology [facility] at [facility] Pager- [phone] 07/15/2020

## 2020-07-15 NOTE — Progress Notes (Signed)
Patient reports the right side chest wall pain is 6/10 today and a new Fentanyl patch was place last night.  Has a 6 lb wt loss since last visit but he thinks he is not eating much due to stress.

## 2020-07-16 ENCOUNTER — Telehealth: Payer: Self-pay

## 2020-07-16 ENCOUNTER — Other Ambulatory Visit: Payer: Self-pay | Admitting: *Deleted

## 2020-07-16 ENCOUNTER — Other Ambulatory Visit: Payer: Self-pay

## 2020-07-16 DIAGNOSIS — R918 Other nonspecific abnormal finding of lung field: Secondary | ICD-10-CM

## 2020-07-16 NOTE — Telephone Encounter (Signed)
Spoke with patient to let him know his appointment 07/19/20 with Dr.Oaks has been cancelled and that I have contacted Dr.Yu's office regarding the referral. I let patient know we could refer him to [facility] due to Dr.Oaks being out. I will be back in touch with the patient once I hear from Dr.Yu's office.

## 2020-07-18 ENCOUNTER — Other Ambulatory Visit: Payer: PPO

## 2020-07-18 NOTE — Progress Notes (Signed)
Tumor Board Documentation  Joshua Guerrero was presented by Dr Tasia Catchings at our Tumor Board on 07/18/2020, which included representatives from medical oncology,radiation oncology,navigation,pathology,radiology,surgical,pharmacy,genetics,research,palliative care,pulmonology.  Joshua Guerrero currently presents as a new patient,for discussion with history of the following treatments: active survellience.  Additionally, we reviewed previous medical and familial history, history of present illness, and recent lab results along with all available histopathologic and imaging studies. The tumor board considered available treatment options and made the following recommendations: Biopsy (Refer to Pulmanology for Bronch)    The following procedures/referrals were also placed: No orders of the defined types were placed in this encounter.   Clinical Trial Status: not discussed   Staging used: To be determined  AJCC Staging:       Group: Lung Mass   National site-specific guidelines   were discussed with respect to the case.  Tumor board is a meeting of clinicians from various specialty areas who evaluate and discuss patients for whom a multidisciplinary approach is being considered. Final determinations in the plan of care are those of the provider(s). The responsibility for follow up of recommendations given during tumor board is that of the provider.   Today's extended care, comprehensive team conference, Joshua Guerrero was not present for the discussion and was not examined.   Multidisciplinary Tumor Board is a multidisciplinary case peer review process.  Decisions discussed in the Multidisciplinary Tumor Board reflect the opinions of the specialists present at the conference without having examined the patient.  Ultimately, treatment and diagnostic decisions rest with the primary provider(s) and the patient.

## 2020-07-19 ENCOUNTER — Ambulatory Visit: Payer: PPO | Admitting: Cardiothoracic Surgery

## 2020-07-23 ENCOUNTER — Ambulatory Visit: Payer: PPO

## 2020-07-23 ENCOUNTER — Telehealth: Payer: Self-pay | Admitting: Pulmonary Disease

## 2020-07-23 ENCOUNTER — Ambulatory Visit: Payer: PPO | Admitting: Pulmonary Disease

## 2020-07-23 ENCOUNTER — Other Ambulatory Visit: Payer: Self-pay

## 2020-07-23 ENCOUNTER — Encounter: Payer: Self-pay | Admitting: Pulmonary Disease

## 2020-07-23 VITALS — BP 142/70 | HR 80 | Temp 97.3°F | Ht 69.0 in | Wt 172.2 lb

## 2020-07-23 DIAGNOSIS — R918 Other nonspecific abnormal finding of lung field: Secondary | ICD-10-CM

## 2020-07-23 DIAGNOSIS — F1190 Opioid use, unspecified, uncomplicated: Secondary | ICD-10-CM | POA: Diagnosis not present

## 2020-07-23 DIAGNOSIS — R590 Localized enlarged lymph nodes: Secondary | ICD-10-CM | POA: Diagnosis not present

## 2020-07-23 DIAGNOSIS — F17210 Nicotine dependence, cigarettes, uncomplicated: Secondary | ICD-10-CM

## 2020-07-23 NOTE — H&P (View-Only) (Signed)
Subjective:    Patient ID: Joshua Guerrero, male    DOB: 1956-02-12, 65 y.o.   MRN: [id number]  HPI Patient is a 65 year old current smoker (half PPD) who presents for evaluation of right anterior chest pain and abnormal chest CT.  He is kindly referred by Dr. Earlie Server, patient's primary care physician is Dr. Miguel Aschoff.  The patient states that he started having right-sided chest pain since around October 2021 which has gotten steadily worse.  A chest x-ray performed on 25 April 2020 showed an opacity on the right lung which had some rounded features.  He was treated with antibiotics at that time for potential pneumonia.  He had a CT scan of the chest performed in February that showed a potential mass and consolidation.  Patient was referred to oncology at that time.  A PET/CT was ordered at that time which shows this lesion to be highly PET avid.  The patient has also noted weight loss and anorexia.  No fevers, chills or sweats.  No purulent sputum production.  No hemoptysis.  Findings are concerning for a primary lung cancer.   The patient's chest pain is not relieved by anything.  He is chronically on narcotics for chronic pain but these do not alleviate the anterior RIGHT chest pain.  He does not endorse any other symptomatology.  We discussed the possible modalities of biopsy namely percutaneous needle biopsy by IR versus ENB with EBUS for diagnosis and staging.  The advantages/disadvantages of each were presented to him.  He had time to ask questions and these were answered to his satisfaction.  Patient opts for ENB with EBUS.  He understands his procedure will need to be done under general anesthesia.   Review of Systems A 10 point review of systems was performed and it is as noted above otherwise negative.  Past Medical History:  Diagnosis Date  . Arthritis    lower back  . Brain bleed ([place]) 10/2016   AT [facility]IC[facility] NO SURGERY  . Confusion caused by a drug     fentanyl and oxycodone  . GERD (gastroesophageal reflux disease)   . Headache    past hx, secondary to nerve damage from accident  . Hypertension    CONTROLLED ON MEDS  . Neck pain, chronic   . Neuromuscular disorder ([place])    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  . Sleep apnea    Not currently using a C-Pap   Past Surgical History:  Procedure Laterality Date  . COLONOSCOPY WITH PROPOFOL N/A 02/11/2015   Procedure: COLONOSCOPY WITH PROPOFOL;  Surgeon: Lucilla Lame, MD;  Location: [facility];  Service: Endoscopy;  Laterality: N/A;  USES C-PAP  . ESOPHAGEAL DILATION N/A 10/14/2018   Procedure: ESOPHAGEAL DILATION;  Surgeon: Lucilla Lame, MD;  Location: [facility];  Service: Endoscopy;  Laterality: N/A;  . ESOPHAGOGASTRODUODENOSCOPY (EGD) WITH PROPOFOL N/A 10/14/2018   Procedure: ESOPHAGOGASTRODUODENOSCOPY (EGD) WITH BIOPSY;  Surgeon: Lucilla Lame, MD;  Location: [facility];  Service: Endoscopy;  Laterality: N/A;  sleep apnea  . NASAL SINUS SURGERY     due to severe sleep apnea  . NECK SURGERY     diffuse disc-and put in possibly screws patient thinks  . SHOULDER SURGERY Bilateral    x 2  . UMBILICAL HERNIA REPAIR N/A 02/26/2017   Primary repair.  Surgeon: Robert Bellow, MD;  Location: [facility];  Service: General;  Laterality: N/A;   Family History  Problem Relation  Age of Onset  . Dementia Father   . Melanoma Mother   . Cancer Paternal Uncle        unknown  . Cancer Paternal Uncle        unknown   Social History   Tobacco Use  . Smoking status: Current Every Day Smoker    Packs/day: 1.00    Years: 30.00    Pack years: 30.00    Types: Cigarettes  . Smokeless tobacco: Never Used  . Tobacco comment: currently about 1/2 PPD--07/23/2020  Substance Use Topics  . Alcohol use: No   Allergies  Allergen Reactions  . Acetaminophen Shortness Of Breath  . Codeine Itching and Nausea And Vomiting  . Opana [Oxymorphone] Swelling   Current  Meds  Medication Sig  . baclofen (LIORESAL) 10 MG tablet Take 5 mg by mouth 4 (four) times daily. STARTING AT LUNCHTIME  . desvenlafaxine (PRISTIQ) 100 MG 24 hr tablet Take 1 tablet (100 mg total) by mouth daily.  . fentaNYL (DURAGESIC - DOSED MCG/HR) 25 MCG/HR patch   . hydrochlorothiazide (HYDRODIURIL) 25 MG tablet Take 1 tablet (25 mg total) by mouth daily.  . hydrOXYzine (ATARAX/VISTARIL) 50 MG tablet TAKE 1 TABLET BY MOUTH 3 TIMES DAILY AS NEEDED (USE AS NEEDED FOR SLEEP)  . lansoprazole (PREVACID) 30 MG capsule TAKE ONE CAPSULE BY MOUTH TWICE A DAY BEFORE A MEAL  . metoprolol succinate (TOPROL-XL) 50 MG 24 hr tablet TAKE 1 TABLET BY MOUTH DAILY WITH OR IMMEDIATELY FOLLOWING A MEAL  . ondansetron (ZOFRAN-ODT) 4 MG disintegrating tablet Take 1 tablet (4 mg total) by mouth every 8 (eight) hours as needed for nausea or vomiting.  Marland Kitchen oxyCODONE-acetaminophen (PERCOCET) 10-325 MG tablet Take 1 tablet by mouth every 4 (four) hours. 3AM, 7AM, 11AM, 3 PM, 7PM AND 11PM  . prochlorperazine (COMPAZINE) 10 MG tablet Take 1 tablet (10 mg total) by mouth every 8 (eight) hours as needed for nausea or vomiting.  . promethazine (PHENERGAN) 25 MG tablet TAKE 1 TABLET BY MOUTH EVERY 8 HOURS AS NEEDED FOR NAUSEA OR VOMITING  . sucralfate (CARAFATE) 1 g tablet TAKE 1 TABLET BY MOUTH 4 TIMES A DAY WITH MEALS AND AT BEDTIME  . topiramate (TOPAMAX) 50 MG tablet Take 50 mg by mouth 2 (two) times daily.   Immunization History  Administered Date(s) Administered  . Influenza,inj,Quad PF,6+ Mos 02/20/2015, 05/26/2016, 12/23/2016, 03/07/2018, 03/22/2019, 04/25/2020  . PFIZER(Purple Top)SARS-COV-2 Vaccination 08/31/2019, 09/28/2019, 03/12/2020  . Pneumococcal Polysaccharide-23 05/26/2016  . Td 08/17/2018  . Tdap 07/31/2008       Objective:   Physical Exam BP (!) 142/70 (BP Location: Left Arm, Cuff Size: Normal)   Pulse 80   Temp (!) 97.3 F (36.3 C) (Temporal)   Ht 5\' 9"  (1.753 m)   Wt 172 lb 3.2 oz (78.1 kg)    SpO2 95%   BMI 25.43 kg/m  GENERAL: Well-developed, well-nourished gentleman, in no acute distress. HEAD: Normocephalic, atraumatic.  EYES: Pupils equal, round, reactive to light.  No scleral icterus.  MOUTH: Masking requirements. NECK: Supple. No thyromegaly. Trachea midline. No JVD.  No adenopathy. PULMONARY: Good air entry bilaterally.  No adventitious sounds. CARDIOVASCULAR: S1 and S2. Regular rate and rhythm.  No rubs murmurs or gallops heard. ABDOMEN: Benign. MUSCULOSKELETAL: No joint deformity, no clubbing, no edema.  NEUROLOGIC: No focal deficit, no gait disturbance, speech is fluent. SKIN: Intact,warm,dry.  On limited exam no rashes. PSYCH: Mood and behavior normal.  Representative slice of PET/CT performed 14 June 2020:   Image  from PET/CT performed 11 July 2020:       Assessment & Plan:     ICD-10-CM   1. Mass of right lung  R91.8 CT Super D Chest Wo Contrast   Carcinoma until proven otherwise Needs ENB for diagnosis Procedure scheduled for 28 March  2. Mediastinal adenopathy  R59.0    Will need EBUS for staging  3. Chronic, continuous use of opioids  F11.90    On fentanyl patch This issue adds complexity to his management Will need to take into account during procedure  4. Tobacco dependence due to cigarettes  F17.210    Patient was counseled regards to discontinuation of smoking Total counseling time 3 to 5 minutes   Orders Placed This Encounter  Procedures  . CT Super D Chest Wo Contrast    Prior to 07/29/2020    Order Specific Question:   Preferred imaging location?    Answer:   [facility]   Discussion:  Patient has a RIGHT upper lobe lung mass measuring 5.2 x 3.0 cm the images quite avid on PET/CT.  This is highly suspicious for carcinoma.  Patient has had associated symptoms that suggest malignancy to include weight loss.  Recommend ENB for diagnosis and will perform EBUS at the same time for mediastinal staging.  Procedure has been  scheduled for 28 March at 12:45 PM.  Benefits, limitations and potential complications of the procedure were discussed with the patient/family  including, but not limited to bleeding, hemoptysis, respiratory failure requiring intubation and/or prolongued mechanical ventilation, infection, pneumothorax (collapse of lung) requiring chest tube placement, stroke or even death.  Patient agrees to proceed.  We will see the patient in follow-up in however he will be apprised of findings from the procedure immediately after and subsequently once pathology is known.  The above was communicated to the patient's primary care physician Dr. Miguel Aschoff and the patient's oncologist Dr. Earlie Server via secure chat.   Renold Don, MD [facility]   *This note was dictated using voice recognition software/Dragon.  Despite best efforts to proofread, errors can occur which can change the meaning.  Any change was purely unintentional.

## 2020-07-23 NOTE — Telephone Encounter (Signed)
Patient is aware that he will need to arrive at the medical mall at 11:30 the day of his procedure.  He voiced his understanding and had no further questions.  Nothing further needed at this time.

## 2020-07-23 NOTE — Telephone Encounter (Addendum)
Phone pre admit visit is scheduled for 07/25/2020 at 2:30 and covid test at 12:35. Patient is aware and voiced his understanding.

## 2020-07-23 NOTE — Patient Instructions (Addendum)
We are scheduling your procedure for 28 March at 12:45 PM  They will be giving you instructions before the procedure.   We will need to get a scan for planning of the procedure.  This will allow [place] to make a map of your airway to do your procedure.   We will see you in follow-up in 4 to 6 weeks time call sooner should any new problems arise.

## 2020-07-23 NOTE — Telephone Encounter (Signed)
Navigation bronch with EBUS has been scheduled for 07/29/2020 at 12:30. PX:TGGY mass IRS:[id number], [place], please see bronch info.

## 2020-07-23 NOTE — Progress Notes (Signed)
Subjective:    Patient ID: Joshua Guerrero, male    DOB: [birth year], 65 y.o.   MRN: [id number]  HPI Patient is a 65 year old current smoker (half PPD) who presents for evaluation of right anterior chest pain and abnormal chest CT.  He is kindly referred by Dr. Earlie Server, patient's primary care physician is Dr. Miguel Aschoff.  The patient states that he started having right-sided chest pain since around October 2021 which has gotten steadily worse.  A chest x-ray performed on 25 April 2020 showed an opacity on the right lung which had some rounded features.  He was treated with antibiotics at that time for potential pneumonia.  He had a CT scan of the chest performed in February that showed a potential mass and consolidation.  Patient was referred to oncology at that time.  A PET/CT was ordered at that time which shows this lesion to be highly PET avid.  The patient has also noted weight loss and anorexia.  No fevers, chills or sweats.  No purulent sputum production.  No hemoptysis.  Findings are concerning for a primary lung cancer.   The patient's chest pain is not relieved by anything.  He is chronically on narcotics for chronic pain but these do not alleviate the anterior RIGHT chest pain.  He does not endorse any other symptomatology.  We discussed the possible modalities of biopsy namely percutaneous needle biopsy by IR versus ENB with EBUS for diagnosis and staging.  The advantages/disadvantages of each were presented to him.  He had time to ask questions and these were answered to his satisfaction.  Patient opts for ENB with EBUS.  He understands his procedure will need to be done under general anesthesia.   Review of Systems A 10 point review of systems was performed and it is as noted above otherwise negative.  Past Medical History:  Diagnosis Date  . Arthritis    lower back  . Brain bleed ([place]) 10/2016   AT [facility] NO SURGERY  . Confusion caused by a drug     fentanyl and oxycodone  . GERD (gastroesophageal reflux disease)   . Headache    past hx, secondary to nerve damage from accident  . Hypertension    CONTROLLED ON MEDS  . Neck pain, chronic   . Neuromuscular disorder ([place])    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  . Sleep apnea    Not currently using a C-Pap   Past Surgical History:  Procedure Laterality Date  . COLONOSCOPY WITH PROPOFOL N/A 02/11/2015   Procedure: COLONOSCOPY WITH PROPOFOL;  Surgeon: Lucilla Lame, MD;  Location: [employer] [facility];  Service: Endoscopy;  Laterality: N/A;  USES C-PAP  . ESOPHAGEAL DILATION N/A 10/14/2018   Procedure: ESOPHAGEAL DILATION;  Surgeon: Lucilla Lame, MD;  Location: [facility];  Service: Endoscopy;  Laterality: N/A;  . ESOPHAGOGASTRODUODENOSCOPY (EGD) WITH PROPOFOL N/A 10/14/2018   Procedure: ESOPHAGOGASTRODUODENOSCOPY (EGD) WITH BIOPSY;  Surgeon: Lucilla Lame, MD;  Location: [facility];  Service: Endoscopy;  Laterality: N/A;  sleep apnea  . NASAL SINUS SURGERY     due to severe sleep apnea  . NECK SURGERY     diffuse disc-and put in possibly screws patient thinks  . SHOULDER SURGERY Bilateral    x 2  . UMBILICAL HERNIA REPAIR N/A 02/26/2017   Primary repair.  Surgeon: Robert Bellow, MD;  Location: [facility];  Service: General;  Laterality: N/A;   Family History  Problem Relation  Age of Onset  . Dementia Father   . Melanoma Mother   . Cancer Paternal Uncle        unknown  . Cancer Paternal Uncle        unknown   Social History   Tobacco Use  . Smoking status: Current Every Day Smoker    Packs/day: 1.00    Years: 30.00    Pack years: 30.00    Types: Cigarettes  . Smokeless tobacco: Never Used  . Tobacco comment: currently about 1/2 PPD--07/23/2020  Substance Use Topics  . Alcohol use: No   Allergies  Allergen Reactions  . Acetaminophen Shortness Of Breath  . Codeine Itching and Nausea And Vomiting  . Opana [Oxymorphone] Swelling   Current  Meds  Medication Sig  . baclofen (LIORESAL) 10 MG tablet Take 5 mg by mouth 4 (four) times daily. STARTING AT LUNCHTIME  . desvenlafaxine (PRISTIQ) 100 MG 24 hr tablet Take 1 tablet (100 mg total) by mouth daily.  . fentaNYL (DURAGESIC - DOSED MCG/HR) 25 MCG/HR patch   . hydrochlorothiazide (HYDRODIURIL) 25 MG tablet Take 1 tablet (25 mg total) by mouth daily.  . hydrOXYzine (ATARAX/VISTARIL) 50 MG tablet TAKE 1 TABLET BY MOUTH 3 TIMES DAILY AS NEEDED (USE AS NEEDED FOR SLEEP)  . lansoprazole (PREVACID) 30 MG capsule TAKE ONE CAPSULE BY MOUTH TWICE A DAY BEFORE A MEAL  . metoprolol succinate (TOPROL-XL) 50 MG 24 hr tablet TAKE 1 TABLET BY MOUTH DAILY WITH OR IMMEDIATELY FOLLOWING A MEAL  . ondansetron (ZOFRAN-ODT) 4 MG disintegrating tablet Take 1 tablet (4 mg total) by mouth every 8 (eight) hours as needed for nausea or vomiting.  Marland Kitchen oxyCODONE-acetaminophen (PERCOCET) 10-325 MG tablet Take 1 tablet by mouth every 4 (four) hours. 3AM, 7AM, 11AM, 3 PM, 7PM AND 11PM  . prochlorperazine (COMPAZINE) 10 MG tablet Take 1 tablet (10 mg total) by mouth every 8 (eight) hours as needed for nausea or vomiting.  . promethazine (PHENERGAN) 25 MG tablet TAKE 1 TABLET BY MOUTH EVERY 8 HOURS AS NEEDED FOR NAUSEA OR VOMITING  . sucralfate (CARAFATE) 1 g tablet TAKE 1 TABLET BY MOUTH 4 TIMES A DAY WITH MEALS AND AT BEDTIME  . topiramate (TOPAMAX) 50 MG tablet Take 50 mg by mouth 2 (two) times daily.   Immunization History  Administered Date(s) Administered  . Influenza,inj,Quad PF,6+ Mos 02/20/2015, 05/26/2016, 12/23/2016, 03/07/2018, 03/22/2019, 04/25/2020  . PFIZER(Purple Top)SARS-COV-2 Vaccination 08/31/2019, 09/28/2019, 03/12/2020  . Pneumococcal Polysaccharide-23 05/26/2016  . Td 08/17/2018  . Tdap 07/31/2008       Objective:   Physical Exam BP (!) 142/70 (BP Location: Left Arm, Cuff Size: Normal)   Pulse 80   Temp (!) 97.3 F (36.3 C) (Temporal)   Ht 5\' 9"  (1.753 m)   Wt 172 lb 3.2 oz (78.1 kg)    SpO2 95%   BMI 25.43 kg/m  GENERAL: Well-developed, well-nourished gentleman, in no acute distress. HEAD: Normocephalic, atraumatic.  EYES: Pupils equal, round, reactive to light.  No scleral icterus.  MOUTH: Masking requirements. NECK: Supple. No thyromegaly. Trachea midline. No JVD.  No adenopathy. PULMONARY: Good air entry bilaterally.  No adventitious sounds. CARDIOVASCULAR: S1 and S2. Regular rate and rhythm.  No rubs murmurs or gallops heard. ABDOMEN: Benign. MUSCULOSKELETAL: No joint deformity, no clubbing, no edema.  NEUROLOGIC: No focal deficit, no gait disturbance, speech is fluent. SKIN: Intact,warm,dry.  On limited exam no rashes. PSYCH: Mood and behavior normal.  Representative slice of PET/CT performed 14 June 2020:   Image  from PET/CT performed 11 July 2020:       Assessment & Plan:     ICD-10-CM   1. Mass of right lung  R91.8 CT Super D Chest Wo Contrast   Carcinoma until proven otherwise Needs ENB for diagnosis Procedure scheduled for 28 March  2. Mediastinal adenopathy  R59.0    Will need EBUS for staging  3. Chronic, continuous use of opioids  F11.90    On fentanyl patch This issue adds complexity to his management Will need to take into account during procedure  4. Tobacco dependence due to cigarettes  F17.210    Patient was counseled regards to discontinuation of smoking Total counseling time 3 to 5 minutes   Orders Placed This Encounter  Procedures  . CT Super D Chest Wo Contrast    Prior to 07/29/2020    Order Specific Question:   Preferred imaging location?    Answer:   [facility]   Discussion:  Patient has a RIGHT upper lobe lung mass measuring 5.2 x 3.0 cm the images quite avid on PET/CT.  This is highly suspicious for carcinoma.  Patient has had associated symptoms that suggest malignancy to include weight loss.  Recommend ENB for diagnosis and will perform EBUS at the same time for mediastinal staging.  Procedure has been  scheduled for 28 March at 12:45 PM.  Benefits, limitations and potential complications of the procedure were discussed with the patient/family  including, but not limited to bleeding, hemoptysis, respiratory failure requiring intubation and/or prolongued mechanical ventilation, infection, pneumothorax (collapse of lung) requiring chest tube placement, stroke or even death.  Patient agrees to proceed.  We will see the patient in follow-up in however he will be apprised of findings from the procedure immediately after and subsequently once pathology is known.  The above was communicated to the patient's primary care physician Dr. Miguel Aschoff and the patient's oncologist Dr. Earlie Server via secure chat.   Renold Don, MD [facility]   *This note was dictated using voice recognition software/Dragon.  Despite best efforts to proofread, errors can occur which can change the meaning.  Any change was purely unintentional.

## 2020-07-24 NOTE — Telephone Encounter (Signed)
I have spoke with Mrs. Joshua Guerrero with [employer] and she stated that Prior Auth is not required for the codes 31627, [phone], [phone], [phone] Refer # Marlou Starks 07/24/2020

## 2020-07-25 ENCOUNTER — Other Ambulatory Visit
Admission: RE | Admit: 2020-07-25 | Discharge: 2020-07-25 | Disposition: A | Discharge status: Home / Self Care | Payer: PPO | Source: Ambulatory Visit | Attending: Pulmonary Disease | Admitting: Pulmonary Disease

## 2020-07-25 ENCOUNTER — Other Ambulatory Visit: Payer: Self-pay

## 2020-07-25 DIAGNOSIS — Z01812 Encounter for preprocedural laboratory examination: Secondary | ICD-10-CM | POA: Insufficient documentation

## 2020-07-25 DIAGNOSIS — M961 Postlaminectomy syndrome, not elsewhere classified: Secondary | ICD-10-CM | POA: Diagnosis not present

## 2020-07-25 DIAGNOSIS — Z20822 Contact with and (suspected) exposure to covid-19: Secondary | ICD-10-CM | POA: Diagnosis not present

## 2020-07-25 DIAGNOSIS — G894 Chronic pain syndrome: Secondary | ICD-10-CM | POA: Diagnosis not present

## 2020-07-25 DIAGNOSIS — M47817 Spondylosis without myelopathy or radiculopathy, lumbosacral region: Secondary | ICD-10-CM | POA: Diagnosis not present

## 2020-07-25 DIAGNOSIS — M47812 Spondylosis without myelopathy or radiculopathy, cervical region: Secondary | ICD-10-CM | POA: Diagnosis not present

## 2020-07-25 LAB — BASIC METABOLIC PANEL
Anion gap: 12 (ref 5–15)
BUN: 12 mg/dL (ref 8–23)
CO2: 25 mmol/L (ref 22–32)
Calcium: 8.9 mg/dL (ref 8.9–10.3)
Chloride: 94 mmol/L — ABNORMAL LOW (ref 98–111)
Creatinine, Ser: 0.85 mg/dL (ref 0.61–1.24)
GFR, Estimated: >60 mL/min (ref >60)
Glucose, Bld: 97 mg/dL (ref 70–99)
Potassium: 3.4 mmol/L — ABNORMAL LOW (ref 3.5–5.1)
Sodium: 131 mmol/L — ABNORMAL LOW (ref 135–145)

## 2020-07-25 LAB — CBC
HCT: 32.9 % — ABNORMAL LOW (ref 39.0–52.0)
Hemoglobin: 10.2 g/dL — ABNORMAL LOW (ref 13.0–17.0)
MCH: 23.4 pg — ABNORMAL LOW (ref 26.0–34.0)
MCHC: 31 g/dL (ref 30.0–36.0)
MCV: 75.5 fL — ABNORMAL LOW (ref 80.0–100.0)
Platelets: 660 10*3/uL — ABNORMAL HIGH (ref 150–400)
RBC: 4.36 MIL/uL (ref 4.22–5.81)
RDW: 17.8 % — ABNORMAL HIGH (ref 11.5–15.5)
WBC: 17.3 10*3/uL — ABNORMAL HIGH (ref 4.0–10.5)
nRBC: 0 % (ref 0.0–0.2)

## 2020-07-25 NOTE — Patient Instructions (Signed)
Your procedure is scheduled on: Monday 07/29/20.  Report to THE FIRST FLOOR REGISTRATION DESK IN THE MEDICAL MALL ON THE MORNING OF SURGERY FIRST, THEN YOU WILL CHECK IN AT THE SURGERY INFORMATION DESK LOCATED OUTSIDE THE SAME DAY SURGERY DEPARTMENT LOCATED ON 2ND FLOOR MEDICAL MALL ENTRANCE.  To find out your arrival time please call [phone] between 1PM - 3PM on Friday 07/26/20.   Remember: Instructions that are not followed completely may result in serious medical risk, up to and including death, or upon the discretion of your surgeon and anesthesiologist your surgery may need to be rescheduled.     __X__ 1. Do not eat food after midnight the night before your procedure.                 No gum chewing or hard candies. You may drink clear liquids up to 2 hours                 before you are scheduled to arrive for your surgery- DO NOT drink clear                 liquids within 2 hours of the start of your surgery.                 Clear Liquids include:  water, apple juice without pulp, clear carbohydrate                 drink such as Clearfast or Gatorade, Black Coffee or Tea (Do not add                 milk or creamer to coffee or tea).  __X__2.  On the morning of surgery brush your teeth with toothpaste and water, you may rinse your mouth with mouthwash if you wish.  Do not swallow any toothpaste or mouthwash.    __X__ 3.  No Alcohol for 24 hours before or after surgery.  __X__ 4.  Do Not Smoke or use e-cigarettes For 24 Hours Prior to Your Surgery.                 Do not use any chewable tobacco products for at least 6 hours prior to                 surgery.  __X__5.  Notify your doctor if there is any change in your medical condition      (cold, fever, infections).      Do NOT wear jewelry, make-up, hairpins, clips or nail polish. Do NOT wear lotions, powders, or perfumes.  Do NOT shave 48 hours prior to surgery. Men may shave face and neck. Do NOT bring valuables to the  hospital.     [facility] is not responsible for any belongings or valuables.   Contacts, dentures/partials or body piercings may not be worn into surgery. Bring a case for your contacts, glasses or hearing aids, a denture cup will be supplied.    Patients discharged the day of surgery will not be allowed to drive home.     __X__ Take these medicines the morning of surgery with A SIP OF WATER:     1. desvenlafaxine (PRISTIQ)   2. lansoprazole (PREVACID)  3. metoprolol succinate (TOPROL-XL)  4. topiramate (TOPAMAX)   5. baclofen (LIORESAL)   6. prochlorperazine (COMPAZINE) if needed  7. promethazine (PHENERGAN) if needed    __X__ Stop Anti-inflammatories 7 days before surgery such as Advil, Ibuprofen, Motrin, BC or Goodies Powder, Naprosyn,  Naproxen, Aleve, Aspirin, Meloxicam. May take Tylenol if needed for pain or discomfort.   __X__Do not start taking any new herbal supplements or vitamins prior to your procedure.     Wear comfortable clothing (specific to your surgery type) to the hospital.  Plan for stool softeners for home use; pain medications have a tendency to cause constipation. You can also help prevent constipation by eating foods high in fiber such as fruits and vegetables and drinking plenty of fluids as your diet allows.  After surgery, you can prevent lung complications by doing breathing exercises.Take deep breaths and cough every 1-2 hours. Your doctor may order a device called an Incentive Spirometer to help you take deep breaths.  Please call the [facility] Department at [phone] if you have any questions about these instructions.

## 2020-07-26 LAB — SARS CORONAVIRUS 2 (TAT 6-24 HRS): SARS Coronavirus 2: NEGATIVE

## 2020-07-26 NOTE — Progress Notes (Signed)
  [facility] Perioperative Services: Pre-Admission/Anesthesia Testing  Abnormal Lab Notification   Date: 07/26/20  Name: Joshua Guerrero MRN:   [id number]  Re: Abnormal labs noted during PAT appointment   Provider(s) Notified: Tyler Pita, MD Notification mode: Routed and/or faxed via [place] LAB VALUE(S): Lab Results  Component Value Date   WBC 17.3 (H) 07/25/2020   HGB 10.2 (L) 07/25/2020   HCT 32.9 (L) 07/25/2020   MCV 75.5 (L) 07/25/2020   PLT 660 (H) 07/25/2020   Lab Results  Component Value Date   NA 131 (L) 07/25/2020   CL 94 (L) 07/25/2020    Notes:  Patient is scheduled for a VIDEO BRONCHOSCOPY WITH ENDOBRONCHIAL ULTRASOUND (N/A ) VIDEO BRONCHOSCOPY WITH ENDOBRONCHIAL NAVIGATION (N/A ) on 07/29/2020.   1. LEUKOCYTOSIS - not currently on corticosteroids. Flow cytometry revealed no monoclonal B bell population or circulating blasts.  Areas of consolidation noted in RIGHT lung with associated non-specific mediastinal LAD present. Questionable infectious process related to potential obstructive changes vs progressive malignancy.  2. THROMBOCYTOSIS - favor acute phase reactant in the setting of recent PNA. (+) slight anemia; RBCs microcytic and hypochromic suggestive of an underlying IDA. No recent ferritin. Potentially related to malignancy as well.      3. HYPONATREMIA - patient is currently on daily thiazide diuretic therapy (HCTZ). With the FDG avid pulmonary findings, hyponatremia could potentially be related to a malignancy related SIADH as well. Will send to pulmonologist to make her aware given patient's pending ENB/EBUS.   This is a Community education officer; no formal response is required.   Honor Loh, MSN, APRN, FNP-C, CEN [facility]  Peri-operative Services Nurse Practitioner Phone: [phone] Fax: [phone] 07/26/20 8:47 AM

## 2020-07-29 ENCOUNTER — Ambulatory Visit: Payer: PPO

## 2020-07-29 ENCOUNTER — Ambulatory Visit: Payer: PPO | Admitting: Urgent Care

## 2020-07-29 ENCOUNTER — Encounter
Admission: RE | Disposition: A | Discharge status: Home / Self Care | Payer: Self-pay | Source: Home / Self Care | Attending: Pulmonary Disease

## 2020-07-29 ENCOUNTER — Other Ambulatory Visit: Payer: Self-pay

## 2020-07-29 ENCOUNTER — Ambulatory Visit
Admission: RE | Admit: 2020-07-29 | Discharge: 2020-07-29 | Disposition: A | Discharge status: Home / Self Care | Payer: PPO | Attending: Pulmonary Disease | Admitting: Pulmonary Disease

## 2020-07-29 ENCOUNTER — Encounter: Payer: Self-pay | Admitting: Pulmonary Disease

## 2020-07-29 DIAGNOSIS — J849 Interstitial pulmonary disease, unspecified: Secondary | ICD-10-CM | POA: Diagnosis not present

## 2020-07-29 DIAGNOSIS — Z820 Family history of epilepsy and other diseases of the nervous system: Secondary | ICD-10-CM | POA: Insufficient documentation

## 2020-07-29 DIAGNOSIS — Z9889 Other specified postprocedural states: Secondary | ICD-10-CM

## 2020-07-29 DIAGNOSIS — Z79899 Other long term (current) drug therapy: Secondary | ICD-10-CM | POA: Insufficient documentation

## 2020-07-29 DIAGNOSIS — K279 Peptic ulcer, site unspecified, unspecified as acute or chronic, without hemorrhage or perforation: Secondary | ICD-10-CM | POA: Diagnosis not present

## 2020-07-29 DIAGNOSIS — Z885 Allergy status to narcotic agent status: Secondary | ICD-10-CM | POA: Diagnosis not present

## 2020-07-29 DIAGNOSIS — R590 Localized enlarged lymph nodes: Secondary | ICD-10-CM | POA: Diagnosis not present

## 2020-07-29 DIAGNOSIS — Z79891 Long term (current) use of opiate analgesic: Secondary | ICD-10-CM | POA: Insufficient documentation

## 2020-07-29 DIAGNOSIS — R918 Other nonspecific abnormal finding of lung field: Secondary | ICD-10-CM

## 2020-07-29 DIAGNOSIS — Z7689 Persons encountering health services in other specified circumstances: Secondary | ICD-10-CM | POA: Diagnosis not present

## 2020-07-29 DIAGNOSIS — J984 Other disorders of lung: Secondary | ICD-10-CM | POA: Diagnosis not present

## 2020-07-29 DIAGNOSIS — G8929 Other chronic pain: Secondary | ICD-10-CM | POA: Insufficient documentation

## 2020-07-29 DIAGNOSIS — Z808 Family history of malignant neoplasm of other organs or systems: Secondary | ICD-10-CM | POA: Diagnosis not present

## 2020-07-29 DIAGNOSIS — Z886 Allergy status to analgesic agent status: Secondary | ICD-10-CM | POA: Diagnosis not present

## 2020-07-29 DIAGNOSIS — N400 Enlarged prostate without lower urinary tract symptoms: Secondary | ICD-10-CM | POA: Diagnosis not present

## 2020-07-29 DIAGNOSIS — F17210 Nicotine dependence, cigarettes, uncomplicated: Secondary | ICD-10-CM | POA: Diagnosis not present

## 2020-07-29 DIAGNOSIS — Z809 Family history of malignant neoplasm, unspecified: Secondary | ICD-10-CM | POA: Diagnosis not present

## 2020-07-29 DIAGNOSIS — E785 Hyperlipidemia, unspecified: Secondary | ICD-10-CM | POA: Diagnosis not present

## 2020-07-29 HISTORY — PX: VIDEO BRONCHOSCOPY WITH ENDOBRONCHIAL NAVIGATION: SHX6175

## 2020-07-29 HISTORY — PX: VIDEO BRONCHOSCOPY WITH ENDOBRONCHIAL ULTRASOUND: SHX6177

## 2020-07-29 SURGERY — BRONCHOSCOPY, WITH EBUS
Anesthesia: General

## 2020-07-29 MED ORDER — IPRATROPIUM-ALBUTEROL 0.5-2.5 (3) MG/3ML IN SOLN
RESPIRATORY_TRACT | Status: AC
  Administered 2020-07-29: 3 mL via RESPIRATORY_TRACT
  Filled 2020-07-29: qty 3

## 2020-07-29 MED ORDER — GLYCOPYRROLATE 0.2 MG/ML IJ SOLN
INTRAMUSCULAR | Status: AC
  Filled 2020-07-29: qty 1

## 2020-07-29 MED ORDER — LIDOCAINE HCL (PF) 2 % IJ SOLN
INTRAMUSCULAR | Status: AC
  Filled 2020-07-29: qty 5

## 2020-07-29 MED ORDER — LIDOCAINE HCL (CARDIAC) PF 100 MG/5ML IV SOSY
PREFILLED_SYRINGE | INTRAVENOUS | Status: DC | PRN
  Administered 2020-07-29: 100 mg via INTRAVENOUS

## 2020-07-29 MED ORDER — LACTATED RINGERS IV SOLN: INTRAVENOUS | Status: DC

## 2020-07-29 MED ORDER — PROPOFOL 10 MG/ML IV BOLUS
INTRAVENOUS | Status: AC
  Filled 2020-07-29: qty 20

## 2020-07-29 MED ORDER — MIDAZOLAM HCL 2 MG/2ML IJ SOLN
INTRAMUSCULAR | Status: AC
  Filled 2020-07-29: qty 2

## 2020-07-29 MED ORDER — FENTANYL CITRATE (PF) 100 MCG/2ML IJ SOLN
INTRAMUSCULAR | Status: DC | PRN
  Administered 2020-07-29: 25 ug via INTRAVENOUS
  Administered 2020-07-29: 50 ug via INTRAVENOUS
  Administered 2020-07-29: 25 ug via INTRAVENOUS

## 2020-07-29 MED ORDER — SUGAMMADEX SODIUM 200 MG/2ML IV SOLN
INTRAVENOUS | Status: DC | PRN
  Administered 2020-07-29: 200 mg via INTRAVENOUS

## 2020-07-29 MED ORDER — ONDANSETRON HCL 4 MG/2ML IJ SOLN
INTRAMUSCULAR | Status: AC
  Filled 2020-07-29: qty 2

## 2020-07-29 MED ORDER — DEXAMETHASONE SODIUM PHOSPHATE 10 MG/ML IJ SOLN
INTRAMUSCULAR | Status: DC | PRN
  Administered 2020-07-29: 10 mg via INTRAVENOUS

## 2020-07-29 MED ORDER — ROCURONIUM BROMIDE 10 MG/ML (PF) SYRINGE
PREFILLED_SYRINGE | INTRAVENOUS | Status: AC
  Filled 2020-07-29: qty 10

## 2020-07-29 MED ORDER — MIDAZOLAM HCL 2 MG/2ML IJ SOLN
INTRAMUSCULAR | Status: DC | PRN
  Administered 2020-07-29: 1 mg via INTRAVENOUS

## 2020-07-29 MED ORDER — ROCURONIUM BROMIDE 100 MG/10ML IV SOLN
INTRAVENOUS | Status: DC | PRN
  Administered 2020-07-29: 50 mg via INTRAVENOUS
  Administered 2020-07-29: 20 mg via INTRAVENOUS

## 2020-07-29 MED ORDER — ORAL CARE MOUTH RINSE: 15.0000 mL | Freq: Once | OROMUCOSAL | Status: AC

## 2020-07-29 MED ORDER — FENTANYL CITRATE (PF) 100 MCG/2ML IJ SOLN
INTRAMUSCULAR | Status: AC
  Filled 2020-07-29: qty 2

## 2020-07-29 MED ORDER — ONDANSETRON HCL 4 MG/2ML IJ SOLN
INTRAMUSCULAR | Status: DC | PRN
  Administered 2020-07-29: 4 mg via INTRAVENOUS

## 2020-07-29 MED ORDER — CHLORHEXIDINE GLUCONATE 0.12 % MT SOLN
OROMUCOSAL | Status: AC
  Filled 2020-07-29: qty 15

## 2020-07-29 MED ORDER — PROPOFOL 10 MG/ML IV BOLUS
INTRAVENOUS | Status: DC | PRN
  Administered 2020-07-29: 150 mg via INTRAVENOUS

## 2020-07-29 MED ORDER — IPRATROPIUM-ALBUTEROL 0.5-2.5 (3) MG/3ML IN SOLN: 3.0000 mL | RESPIRATORY_TRACT | Status: DC

## 2020-07-29 MED ORDER — DEXAMETHASONE SODIUM PHOSPHATE 10 MG/ML IJ SOLN
INTRAMUSCULAR | Status: AC
  Filled 2020-07-29: qty 1

## 2020-07-29 MED ORDER — PHENYLEPHRINE HCL (PRESSORS) 10 MG/ML IV SOLN
INTRAVENOUS | Status: AC
  Filled 2020-07-29: qty 1

## 2020-07-29 MED ORDER — SODIUM CHLORIDE 0.9 % IV SOLN: Freq: Once | INTRAVENOUS | Status: DC

## 2020-07-29 MED ORDER — CHLORHEXIDINE GLUCONATE 0.12 % MT SOLN
15.0000 mL | Freq: Once | OROMUCOSAL | Status: AC
  Administered 2020-07-29: 15 mL via OROMUCOSAL

## 2020-07-29 NOTE — Interval H&P Note (Signed)
History and Physical Interval Note:  07/29/2020 12:15 PM  Joshua Guerrero  has presented today for surgery, with the diagnosis of LUNG MASS.  The various methods of treatment have been discussed with the patient and family. After consideration of risks, benefits and other options for treatment, the patient has consented to  Procedure(s): VIDEO BRONCHOSCOPY WITH ENDOBRONCHIAL ULTRASOUND (N/A) VIDEO BRONCHOSCOPY WITH ENDOBRONCHIAL NAVIGATION (N/A) as a surgical intervention.  The patient's history has been reviewed, patient examined, no change in status, stable for surgery.  I have reviewed the patient's chart and labs.  Questions were answered to the patient's satisfaction.     Vernard Gambles

## 2020-07-29 NOTE — Anesthesia Postprocedure Evaluation (Signed)
Anesthesia Post Note  Patient: Joshua Guerrero  Procedure(s) Performed: VIDEO BRONCHOSCOPY WITH ENDOBRONCHIAL ULTRASOUND (N/A ) VIDEO BRONCHOSCOPY WITH ENDOBRONCHIAL NAVIGATION (N/A )  Patient location during evaluation: PACU Anesthesia Type: General Level of consciousness: awake and alert and oriented Pain management: pain level controlled Vital Signs Assessment: post-procedure vital signs reviewed and stable Respiratory status: spontaneous breathing Cardiovascular status: blood pressure returned to baseline Anesthetic complications: no   No complications documented.   Last Vitals:  Vitals:   07/29/20 1151 07/29/20 1424  BP: 130/77 (!) 141/70  Pulse: 79 78  Resp: 12 (!) 21  Temp: 37.3 C   SpO2: 94% 100%    Last Pain:  Vitals:   07/29/20 1151  TempSrc: Oral  PainSc: 7                  Koda Routon
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
## 2020-07-29 NOTE — Transfer of Care (Signed)
Immediate Anesthesia Transfer of Care Note  Patient: Joshua Guerrero  Procedure(s) Performed: VIDEO BRONCHOSCOPY WITH ENDOBRONCHIAL ULTRASOUND (N/A ) VIDEO BRONCHOSCOPY WITH ENDOBRONCHIAL NAVIGATION (N/A )  Patient Location: PACU  Anesthesia Type:General  Level of Consciousness: awake, alert  and oriented  Airway & Oxygen Therapy: Patient Spontanous Breathing and Patient connected to face mask oxygen  Post-op Assessment: Report given to RN and Post -op Vital signs reviewed and stable  Post vital signs: Reviewed and stable  Last Vitals:  Vitals Value Taken Time  BP 141/70 07/29/20 1424  Temp    Pulse 76 07/29/20 1426  Resp 18 07/29/20 1426  SpO2 100 % 07/29/20 1426  Vitals shown include unvalidated device data.  Last Pain:  Vitals:   07/29/20 1151  TempSrc: Oral  PainSc: 7          Complications: No complications documented.

## 2020-07-29 NOTE — Op Note (Addendum)
Electromagnetic Navigation Bronchoscopy: Endobronchial ultrasound    Indication:  Right upper lobe lung mass Mediastinal adenopathy  Preoperative Diagnosis:lung nodule/mass Post Procedure Diagnosis:lung nodule/mass Consent: Verbal/Written  Benefits, limitations and potential complications of the procedure were discussed with the patient/family  including, but not limited to bleeding, hemoptysis, respiratory failure requiring intubation and/or prolongued mechanical ventilation, infection, pneumothorax (collapse of lung) requiring chest tube placement, stroke from air embolism or even death.   Type of Anesthesia: General Surgeon: Renold Don, MD Assistant/Scrub: Sullivan Lone, RRT Anesthesiologist/CRNA: Alvin Critchley, MD/Stephanie Mitchelet, CRNA Fluoroscopy available: Yes Catheter Rose available: Yes [facility]  Procedure Performed: 1) virtual Bronchoscopy with Multi-planar Image analysis, 3-D reconstruction of coronal, sagittal and multi-planar images for the purposes of planning real-time bronchoscopy using the iLogic Electromagnetic Navigation Bronchoscopy System (superDimension).  2) endobronchial ultrasound (EBUS) 3) confocal laser endomicroscopy   Description of Procedure: The patient was taken to Procedure Room 2 (Bronchoscopy Suite) appropriate timeout was taken.  Patient was placed on the superDimension table.  Patient was then inducted under general anesthesia by the anesthesia team.  He was intubated with an 8.5 ETT without difficulty.  A Portex adapter was placed on the ETT flange.  At this point the Olympus video bronchoscope was advanced through the Portex adapter and anatomic tour of the airway was performed.   The visible trachea was normal, carina was sharp, inspection of the right lung showed no abnormalities or endobronchial lesions on the right upper lobe, right middle lobe and right lower lobe subsegments.  Inspection of the left lung showed no endobronchial lesions,on  left upper lobe, lingula and lower lobe subsegments.  At this point the superDimension extended working channel and locatable guide (LG) were advanced through the working channel of the bronchoscope.The catheter/LG combo was then placed into the central portion of the trachea.  Initially the navigational equipment was not operational and had to be trouble shot.  Once images could be seen on the navigational screen the LG was directed to standard registration points at the following centers: main carina, right upper lobe bronchus, right lower lobe bronchus, right middle lobe bronchus, left upper lobe bronchus, and the left lower lobe bronchus. This data was transferred to the i-Logic ENB system for real-time bronchoscopy.  Once registration was completed, the scope was advanced until it could go no further and then the extended working channel/LG combo was navigated to the RIGHT upper lobe for tissue sampling.  The position of the LG was confirmed with fluoroscopy.  At this point the LG was removed and the Cellvizio endomicroscope probe was advanced through the extended working channel and abnormal tissue was confirmed.  Once this was performed, the Endomicroscope probe was removed and and transbronchial brushings were performed.  ROSE revealed only inflammatory cells, probe was then redirected, Cellvizio inspection repeated still showing abnormal tissue consistent with malignancy, additional brushings were performed a total of 3 brushes were utilized.  Brushes at this point were placed on CytoLyt. Sampling was difficult due to inability to visualize the tools well under fluoroscopy due to anterior location of the mass.  However sampling was performed as able. Once this was completed the airway was examined for hemostasis and the bronchoscope was retrieved and exchanged for Olympus endobronchial ultrasound (EBUS) scope.  At this point the mediastinum was examined there was a 1.5 x 1.2 cm lymph node noted on the Right  Subcarinal space, utilizing a Cook 22-gauge EBUS needle transbronchial needle aspirates were performed x4 subcarinal space.  ROSE failed to show  adequate yield from this lymph node.  Material was placed in [place].  Examination of the 10 R and 11 R lymph nodes showed that there was indeed lymph nodes noted both measuring between 1.5 to 1.75 cm however these lymph nodes could not be safely sampled due to being behind large blood vessels.  Having completed this portion of the procedure there was minimal heme noted on the left mainstem bronchus, this was lavaged until clear.  Patient then received 10 mL of 1% lidocaine via bronchial lavage and the bronchoscope was retrieved.  The procedure was at this point terminated.  Patient was allowed to emerge from general anesthesia and was transferred to the PACU in satisfactory condition.  Description of Procedure: Specimens Obtained:  Transbronchial Fine Needle Aspirations 21G: X4  Transbronchial Brush: X3  Fluoroscopy:  Fluoroscopy was utilized during the course of this procedure to assure that biopsies were taken in a safe manner under fluoroscopic guidance with spot films required.   Complications:None.  Postprocedure chest x-ray showed no pneumothorax.  Tumor mass appears to be expanding.    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  We will continue to stay in touch with him with regards to the results of the procedure and if further procedures may be necessary to obtain more tissue.   Renold Don, MD [facility]   *This note was dictated using voice recognition software/Dragon.  Despite best efforts to proofread, errors can occur which can change the meaning.  Any change was purely unintentional.

## 2020-07-29 NOTE — Discharge Instructions (Signed)

## 2020-07-29 NOTE — Anesthesia Procedure Notes (Signed)
Procedure Name: Intubation Date/Time: 07/29/2020 12:48 PM Performed by: Johnna Acosta, CRNA Pre-anesthesia Checklist: Patient identified, Emergency Drugs available, Suction available, Patient being monitored and Timeout performed Patient Re-evaluated:Patient Re-evaluated prior to induction Oxygen Delivery Method: Circle system utilized Preoxygenation: Pre-oxygenation with 100% oxygen Induction Type: IV induction Ventilation: Mask ventilation without difficulty Laryngoscope Size: McGraph and 3 Grade View: Grade I Tube type: Oral Tube size: 8.5 mm Number of attempts: 1 Airway Equipment and Method: Stylet,  Video-laryngoscopy and Oral airway Placement Confirmation: ETT inserted through vocal cords under direct vision,  positive ETCO2 and breath sounds checked- equal and bilateral Secured at: 22.5 cm Tube secured with: Tape Dental Injury: Teeth and Oropharynx as per pre-operative assessment  Difficulty Due To: Difficulty was anticipated and Difficult Airway- due to reduced neck mobility

## 2020-07-29 NOTE — OR Nursing (Signed)
cxr done in pacu per pacu RN.

## 2020-07-30 ENCOUNTER — Encounter: Payer: Self-pay | Admitting: Pulmonary Disease

## 2020-07-30 LAB — CYTOLOGY - NON PAP

## 2020-07-30 NOTE — Progress Notes (Signed)
This encounter was created in error - please disregard.

## 2020-07-31 ENCOUNTER — Telehealth: Payer: Self-pay | Admitting: Pulmonary Disease

## 2020-07-31 ENCOUNTER — Ambulatory Visit: Payer: PPO

## 2020-07-31 DIAGNOSIS — R042 Hemoptysis: Secondary | ICD-10-CM

## 2020-07-31 MED ORDER — AMOXICILLIN-POT CLAVULANATE 875-125 MG PO TABS: 1.0000 | ORAL_TABLET | Freq: Two times a day (BID) | ORAL | 0 refills | Status: DC

## 2020-07-31 NOTE — Telephone Encounter (Signed)
Spoke to patient, who stated that he started coughing up blood yesterday after bx on 07/29/2020. Blood is the size of a quarter and bright red. He is also experiencing nausea. Denied fever, chills, sweats or additional sx.   Dr. Patsey Berthold, please advise. thanks

## 2020-07-31 NOTE — Telephone Encounter (Signed)
Dr. Patsey Berthold has been made aware verbally. Patient is aware to start abx today.  Patient voiced her understanding and had no further questions.  Nothing further needed at this time.

## 2020-07-31 NOTE — Telephone Encounter (Signed)
Patient is aware of below recommendations.  CXR and Augmentin has been ordered.  Patient will come to [place] today for CXR. Nothing further is needed at this time.

## 2020-07-31 NOTE — Progress Notes (Signed)
Pt stated that he was coughing up some blood and that he had notified the MD. He also stated that they had ordered an x-ray, he stated  that he will not be able to go today, but will go tomorrow, he was encourage the MD or get help right away if the condition worsens. Continue to monitor.

## 2020-07-31 NOTE — Telephone Encounter (Signed)
His sample was very bloody.  It is because of the inflammation and friability in his lung.  He also will need additional biopsies that we will arrange for.  I recommend getting a chest x-ray today.  We will start Augmentin 875 twice a day with food.  We will call him with the results of the x-ray if anything new is noted.

## 2020-08-01 ENCOUNTER — Encounter: Payer: Self-pay | Admitting: *Deleted

## 2020-08-01 DIAGNOSIS — R918 Other nonspecific abnormal finding of lung field: Secondary | ICD-10-CM

## 2020-08-01 NOTE — Addendum Note (Signed)
Addended by: Claudette Head A on: 08/01/2020 03:52 PM   Modules accepted: Orders

## 2020-08-01 NOTE — Telephone Encounter (Signed)
Per Dr. Patsey Berthold verbally- how is patient feeling today? Make patient aware that [facility], RN will be in contact with him regarding another bx.   Lm for patient.

## 2020-08-01 NOTE — Telephone Encounter (Signed)
Spoke to patient, who stated his bloody sputum has subsided.   I have verbally made Dr. Patsey Berthold aware of this information. Dr. Patsey Berthold stated that patient can hold off on CXR at this time since he is no longer coughing up blood.  Patient voiced his understanding and had no further questions.  Nothing further needed at this time.

## 2020-08-01 NOTE — Progress Notes (Signed)
  Oncology Nurse Navigator Documentation  Navigator Location: [facility] (08/01/20 1400)   )Navigator Encounter Type: Telephone (08/01/20 1400) Telephone: Outgoing Call (08/01/20 1400) Abnormal Finding Date: 06/16/20 (08/01/20 1400)                   Treatment Phase: Abnormal Scans (08/01/20 1400) Barriers/Navigation Needs: Coordination of Care (08/01/20 1400)   Interventions: Coordination of Care (08/01/20 1400)   Coordination of Care: Appts;Radiology (08/01/20 1400)             phone call made to patient to update on pathology results per discussion with Dr. Patsey Berthold and Dr. Tasia Catchings. Pt aware that samples from recent bronchoscopy were not sufficient enough to provide diagnosis and that pt would require additional percutaneous biopsy. Pt informed that follow up appt on Monday 4/4 has been cancelled and will be rescheduled after next biopsy. Instructed pt to call back with any further questions or needs. Pt verbalized understanding.      Time Spent with Patient: 30 (08/01/20 1400)

## 2020-08-02 ENCOUNTER — Encounter: Payer: Self-pay | Admitting: *Deleted

## 2020-08-02 NOTE — Progress Notes (Signed)
  Oncology Nurse Navigator Documentation  Navigator Location: [facility] (08/02/20 1300)   )Navigator Encounter Type: Telephone (08/02/20 1300) Telephone: [name] Call;Appt Confirmation/Clarification (08/02/20 1300)                       Barriers/Navigation Needs: Coordination of Care (08/02/20 1300)   Interventions: Coordination of Care (08/02/20 1300)   Coordination of Care: Appts;Radiology (08/02/20 1300)           follow up phone call made to patient to review upcoming appts for CT guided biopsy and follow up with Dr. Tasia Catchings. All questions answered during call. Pt confirmed all appts and understands to go to [facility] on Mon 4/4 between 8-1pm for covid testing. Contact info given and instructed to call with any further questions or needs. Pt verbalized understanding.       Time Spent with Patient: 30 (08/02/20 1300)

## 2020-08-05 ENCOUNTER — Inpatient Hospital Stay: Payer: PPO | Admitting: Oncology

## 2020-08-05 ENCOUNTER — Other Ambulatory Visit: Payer: Self-pay

## 2020-08-05 ENCOUNTER — Other Ambulatory Visit: Payer: Self-pay | Admitting: Family Medicine

## 2020-08-05 ENCOUNTER — Other Ambulatory Visit
Admission: RE | Admit: 2020-08-05 | Discharge: 2020-08-05 | Disposition: A | Discharge status: Home / Self Care | Payer: PPO | Source: Ambulatory Visit | Attending: Oncology | Admitting: Oncology

## 2020-08-05 ENCOUNTER — Other Ambulatory Visit: Payer: Self-pay | Admitting: Radiology

## 2020-08-05 DIAGNOSIS — Z01812 Encounter for preprocedural laboratory examination: Secondary | ICD-10-CM | POA: Diagnosis not present

## 2020-08-05 DIAGNOSIS — Z20822 Contact with and (suspected) exposure to covid-19: Secondary | ICD-10-CM | POA: Diagnosis not present

## 2020-08-05 DIAGNOSIS — G4733 Obstructive sleep apnea (adult) (pediatric): Secondary | ICD-10-CM

## 2020-08-05 NOTE — Progress Notes (Signed)
Patient on schedule for Lung biopsy 08/07/2020, called and spoke with patient on phone with instructions given. Made aware to be here @ 0830, NPO after MN prior to procedure and driver post procedure/recovery/discharge. Stated understanding

## 2020-08-05 NOTE — Telephone Encounter (Signed)
Requested Prescriptions  Pending Prescriptions Disp Refills  . hydrOXYzine (ATARAX/VISTARIL) 50 MG tablet [Pharmacy Med Name: HYDROXYZINE HCL 50 MG TAB] 90 tablet 1    Sig: TAKE 1 TABLET BY MOUTH 3 TIMES DAILY AS NEEDED (USE AS NEEDED FOR SLEEP)     Ear, Nose, and Throat:  Antihistamines Passed - 08/05/2020  1:07 PM      Passed - Valid encounter within last 12 months    Recent Outpatient Visits          1 month ago Mass in chest   [facility] Jerrol Banana., MD   2 months ago Chest pain, unspecified type   [facility] Jerrol Banana., MD   2 months ago Chest pain, unspecified type   [facility] Jerrol Banana., MD   3 months ago Chest pain, unspecified type   [facility] Jerrol Banana., MD   4 months ago Encounter for immunization   [facility] Jerrol Banana., MD      Future Appointments            In 1 week Jerrol Banana., MD [facility] [place]

## 2020-08-06 ENCOUNTER — Other Ambulatory Visit: Payer: Self-pay | Admitting: Student

## 2020-08-06 LAB — SARS CORONAVIRUS 2 (TAT 6-24 HRS): SARS Coronavirus 2: NEGATIVE

## 2020-08-07 ENCOUNTER — Other Ambulatory Visit: Payer: Self-pay

## 2020-08-07 ENCOUNTER — Ambulatory Visit
Admission: RE | Admit: 2020-08-07 | Discharge: 2020-08-07 | Disposition: A | Discharge status: Home / Self Care | Payer: PPO | Source: Ambulatory Visit | Attending: Oncology | Admitting: Oncology

## 2020-08-07 ENCOUNTER — Ambulatory Visit
Admission: RE | Admit: 2020-08-07 | Discharge: 2020-08-07 | Disposition: A | Discharge status: Home / Self Care | Payer: PPO | Source: Ambulatory Visit | Attending: Interventional Radiology | Admitting: Interventional Radiology

## 2020-08-07 DIAGNOSIS — R918 Other nonspecific abnormal finding of lung field: Secondary | ICD-10-CM | POA: Diagnosis not present

## 2020-08-07 DIAGNOSIS — I700 Atherosclerosis of aorta: Secondary | ICD-10-CM | POA: Diagnosis not present

## 2020-08-07 DIAGNOSIS — J439 Emphysema, unspecified: Secondary | ICD-10-CM | POA: Diagnosis not present

## 2020-08-07 DIAGNOSIS — Z79899 Other long term (current) drug therapy: Secondary | ICD-10-CM | POA: Diagnosis not present

## 2020-08-07 DIAGNOSIS — Z79891 Long term (current) use of opiate analgesic: Secondary | ICD-10-CM | POA: Insufficient documentation

## 2020-08-07 DIAGNOSIS — Z792 Long term (current) use of antibiotics: Secondary | ICD-10-CM | POA: Diagnosis not present

## 2020-08-07 DIAGNOSIS — J984 Other disorders of lung: Secondary | ICD-10-CM | POA: Diagnosis not present

## 2020-08-07 LAB — CBC
HCT: 35.8 % — ABNORMAL LOW (ref 39.0–52.0)
Hemoglobin: 11.1 g/dL — ABNORMAL LOW (ref 13.0–17.0)
MCH: 23.2 pg — ABNORMAL LOW (ref 26.0–34.0)
MCHC: 31 g/dL (ref 30.0–36.0)
MCV: 74.9 fL — ABNORMAL LOW (ref 80.0–100.0)
Platelets: 772 10*3/uL — ABNORMAL HIGH (ref 150–400)
RBC: 4.78 MIL/uL (ref 4.22–5.81)
RDW: 17.4 % — ABNORMAL HIGH (ref 11.5–15.5)
WBC: 13.6 10*3/uL — ABNORMAL HIGH (ref 4.0–10.5)
nRBC: 0 % (ref 0.0–0.2)

## 2020-08-07 LAB — PROTIME-INR
INR: 1.1 (ref 0.8–1.2)
Prothrombin Time: 13.8 seconds (ref 11.4–15.2)

## 2020-08-07 MED ORDER — MIDAZOLAM HCL 2 MG/2ML IJ SOLN
INTRAMUSCULAR | Status: AC | PRN
  Administered 2020-08-07 (×2): 1 mg via INTRAVENOUS

## 2020-08-07 MED ORDER — FENTANYL CITRATE (PF) 100 MCG/2ML IJ SOLN
INTRAMUSCULAR | Status: AC | PRN
  Administered 2020-08-07 (×2): 50 ug via INTRAVENOUS

## 2020-08-07 MED ORDER — SODIUM CHLORIDE 0.9 % IV SOLN: INTRAVENOUS | Status: DC

## 2020-08-07 MED ORDER — MIDAZOLAM HCL 5 MG/5ML IJ SOLN
INTRAMUSCULAR | Status: AC
  Filled 2020-08-07: qty 5

## 2020-08-07 MED ORDER — FENTANYL CITRATE (PF) 100 MCG/2ML IJ SOLN
INTRAMUSCULAR | Status: AC
  Filled 2020-08-07: qty 2

## 2020-08-07 NOTE — Progress Notes (Signed)
Patient clinically stable post CT Lung Biopsy per Dr Jeanmarie Plant well with stable vitals pre and post procedure. Denies complaints at this time. bandade dry/intact to right upper chest. Report given to [place] post procedure/specials. NPO until CXR @ 1660, then po;s if ok. Given Versed 2 mg along with Fentanyl 100 mcg IV for procedure.

## 2020-08-07 NOTE — Procedures (Signed)
Interventional Radiology Procedure Note  Procedure: CT guided biopsy of right lung mass  Complications: None immediate  Recommendations: - Bedrest until CXR cleared.  Minimize talking, coughing or otherwise straining.  - Follow up 2 hr CXR pending    Ruthann Cancer, MD Pager: [phone]

## 2020-08-07 NOTE — H&P (Signed)
Chief Complaint: Patient was seen in consultation today for right lung mass biopsy   Referring Physician(s): Yu,Zhou  Patient Status: [facility] - Out-pt  History of Present Illness: Joshua Guerrero is a 65 y.o. male with history of indeterminate hypermetabolic right upper lobe mass.  He underwent bronchoscopic biopsy on 07/29/20 which was inconclusive without evidence of malignancy.  He presents today for percutaneous, CT guided biopsy.  Feels well today. No fevers, chills, chest pain, shortness of breath, nausea, vomiting, abdominal pain.  Hemoptysis after endobronchial biopsy has resolved.      Past Medical History:  Diagnosis Date  . Arthritis    lower back  . Brain bleed ([place]) 10/2016   AT [facility]-TREATED MEDICALLY NO SURGERY  . Confusion caused by a drug    fentanyl and oxycodone  . GERD (gastroesophageal reflux disease)   . Headache    past hx, secondary to nerve damage from accident  . Hypertension    CONTROLLED ON MEDS  . Neck pain, chronic   . Neuromuscular disorder ([place])    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  . Sleep apnea    Not currently using a C-Pap    Past Surgical History:  Procedure Laterality Date  . COLONOSCOPY WITH PROPOFOL N/A 02/11/2015   Procedure: COLONOSCOPY WITH PROPOFOL;  Surgeon: Lucilla Lame, MD;  Location: [facility];  Service: Endoscopy;  Laterality: N/A;  USES C-PAP  . ESOPHAGEAL DILATION N/A 10/14/2018   Procedure: ESOPHAGEAL DILATION;  Surgeon: Lucilla Lame, MD;  Location: [facility];  Service: Endoscopy;  Laterality: N/A;  . ESOPHAGOGASTRODUODENOSCOPY (EGD) WITH PROPOFOL N/A 10/14/2018   Procedure: ESOPHAGOGASTRODUODENOSCOPY (EGD) WITH BIOPSY;  Surgeon: Lucilla Lame, MD;  Location: [facility];  Service: Endoscopy;  Laterality: N/A;  sleep apnea  . NASAL SINUS SURGERY     due to severe sleep apnea  . NECK SURGERY     diffuse disc-and put in possibly screws patient thinks  . SHOULDER SURGERY Bilateral     x 2  . UMBILICAL HERNIA REPAIR N/A 02/26/2017   Primary repair.  Surgeon: Robert Bellow, MD;  Location: [facility];  Service: General;  Laterality: N/A;  . VIDEO BRONCHOSCOPY WITH ENDOBRONCHIAL NAVIGATION N/A 07/29/2020   Procedure: VIDEO BRONCHOSCOPY WITH ENDOBRONCHIAL NAVIGATION;  Surgeon: Tyler Pita, MD;  Location: [facility];  Service: Pulmonary;  Laterality: N/A;  . VIDEO BRONCHOSCOPY WITH ENDOBRONCHIAL ULTRASOUND N/A 07/29/2020   Procedure: VIDEO BRONCHOSCOPY WITH ENDOBRONCHIAL ULTRASOUND;  Surgeon: Tyler Pita, MD;  Location: [facility];  Service: Pulmonary;  Laterality: N/A;    Allergies: Acetaminophen, Codeine, and Opana [oxymorphone]  Medications: Prior to Admission medications   Medication Sig Start Date End Date Taking? Authorizing Provider  amoxicillin-clavulanate (AUGMENTIN) 875-125 MG tablet Take 1 tablet by mouth 2 (two) times daily. 07/31/20  Yes Tyler Pita, MD  baclofen (LIORESAL) 10 MG tablet Take 10 mg by mouth 4 (four) times daily. STARTING AT LUNCHTIME 05/22/16  Yes [provider]  desvenlafaxine (PRISTIQ) 100 MG 24 hr tablet Take 1 tablet (100 mg total) by mouth daily. 03/12/20  Yes Jerrol Banana., MD  fentaNYL (DURAGESIC - DOSED MCG/HR) 25 MCG/HR patch Place 1 patch onto the skin every 3 (three) days. 01/25/18  Yes [provider]  hydrochlorothiazide (HYDRODIURIL) 25 MG tablet Take 1 tablet (25 mg total) by mouth daily. 03/12/20  Yes Jerrol Banana., MD  hydrOXYzine (ATARAX/VISTARIL) 50 MG tablet TAKE 1 TABLET BY MOUTH 3 TIMES DAILY AS  NEEDED (USE AS NEEDED FOR SLEEP) 08/05/20  Yes Jerrol Banana., MD  lansoprazole (PREVACID) 30 MG capsule TAKE ONE CAPSULE BY MOUTH TWICE A DAY BEFORE A MEAL Patient taking differently: Take 30 mg by mouth 2 (two) times daily before a meal. 10/03/19  Yes Jerrol Banana., MD  metoprolol succinate (TOPROL-XL) 50 MG 24 hr tablet TAKE 1 TABLET BY MOUTH DAILY WITH OR IMMEDIATELY  FOLLOWING A MEAL Patient taking differently: Take 50 mg by mouth daily. TAKE 1 TABLET BY MOUTH DAILY WITH OR IMMEDIATELY FOLLOWING A MEAL 03/12/20  Yes Jerrol Banana., MD  oxyCODONE-acetaminophen (PERCOCET) 10-325 MG tablet Take 1 tablet by mouth every 4 (four) hours. 3AM, 7AM, 11AM, 3 PM, 7PM AND 11PM   Yes [provider]  prochlorperazine (COMPAZINE) 10 MG tablet Take 1 tablet (10 mg total) by mouth every 8 (eight) hours as needed for nausea or vomiting. 04/25/20  Yes Jerrol Banana., MD  promethazine (PHENERGAN) 25 MG tablet TAKE 1 TABLET BY MOUTH EVERY 8 HOURS AS NEEDED FOR NAUSEA OR VOMITING Patient taking differently: Take 25 mg by mouth every 8 (eight) hours as needed for vomiting or nausea. 04/15/20  Yes Jerrol Banana., MD  sucralfate (CARAFATE) 1 g tablet TAKE 1 TABLET BY MOUTH 4 TIMES A DAY WITH MEALS AND AT BEDTIME Patient taking differently: Take 1 g by mouth 2 (two) times daily. 10/03/19  Yes Jerrol Banana., MD  topiramate (TOPAMAX) 50 MG tablet Take 50 mg by mouth 2 (two) times daily. 03/13/19  Yes [provider]     Family History  Problem Relation Age of Onset  . Dementia Father   . Melanoma Mother   . Cancer Paternal Uncle        unknown  . Cancer Paternal Uncle        unknown    Social History   Socioeconomic History  . Marital status: Divorced    Spouse name: single  . Number of children: 0  . Years of education: 30  . Highest education level: High school graduate  Occupational History  . Occupation: disability  Tobacco Use  . Smoking status: Current Every Day Smoker    Packs/day: 1.00    Years: 30.00    Pack years: 30.00    Types: Cigarettes  . Smokeless tobacco: Never Used  . Tobacco comment: currently about 1/2 PPD--07/23/2020  Vaping Use  . Vaping Use: Never used  Substance and Sexual Activity  . Alcohol use: No  . Drug use: No  . Sexual activity: Never  Other Topics Concern  . Not on file  Social  History Narrative  . Not on file   Social Determinants of Health   Financial Resource Strain: Low Risk   . Difficulty of Paying Living Expenses: Not hard at all  Food Insecurity: No Food Insecurity  . Worried About Charity fundraiser in the Last Year: Never true  . Ran Out of Food in the Last Year: Never true  Transportation Needs: No Transportation Needs  . Lack of Transportation (Medical): No  . Lack of Transportation (Non-Medical): No  Physical Activity: Inactive  . Days of Exercise per Week: 0 days  . Minutes of Exercise per Session: 0 min  Stress: No Stress Concern Present  . Feeling of Stress : Only a little  Social Connections: Moderately Isolated  . Frequency of Communication with Friends and Family: More than three times a week  . Frequency of  Social Gatherings with Friends and Family: More than three times a week  . Attends Religious Services: More than 4 times per year  . Active Member of Clubs or Organizations: No  . Attends Archivist Meetings: Never  . Marital Status: Divorced     Review of Systems: A 12 point ROS discussed and pertinent positives are indicated in the HPI above.  All other systems are negative.  Vital Signs: BP 122/80   Pulse 70   Temp 97.8 F (36.6 C) (Oral)   Resp 20   Ht 5\' 9"  (1.753 m)   Wt 77.1 kg   SpO2 96%   BMI 25.10 kg/m   Physical Exam Constitutional:      General: He is not in acute distress. HENT:     Mouth/Throat:     Mouth: Mucous membranes are moist.  Cardiovascular:     Rate and Rhythm: Normal rate and regular rhythm.     Heart sounds: Normal heart sounds.  Pulmonary:     Effort: Pulmonary effort is normal. No respiratory distress.     Breath sounds: Normal breath sounds.  Abdominal:     General: There is no distension.  Musculoskeletal:        General: No swelling.  Skin:    General: Skin is warm and dry.  Neurological:     Mental Status: He is alert and oriented to person, place, and time.      Imaging: NM PET Image Initial (PI) Skull Base To Thigh  Result Date: 07/12/2020 CLINICAL DATA:  Initial treatment strategy for right-sided lung mass/consolidation. Indeterminate slowly enlarging splenic lesion. EXAM: NUCLEAR MEDICINE PET SKULL BASE TO THIGH TECHNIQUE: 9.2 mCi F-18 FDG was injected intravenously. Full-ring PET imaging was performed from the skull base to thigh after the radiotracer. CT data was obtained and used for attenuation correction and anatomic localization. Fasting blood glucose: 107 mg/dl COMPARISON:  Chest CT 06/14/2020.  Abdominal MRI 06/27/2020. FINDINGS: Mediastinal blood pool activity: SUV max 2.3 Liver activity: SUV max NA NECK: No areas of abnormal hypermetabolism. Incidental CT findings: No cervical adenopathy. Bilateral carotid atherosclerosis. CHEST: The right upper lobe process is progressive and hypermetabolic. For example, soft tissue density cephalad to the 06/14/2020 process measures 3.7 x 2.5 cm on 88/3. At the level measured on the prior CT, the consolidation with minimal air bronchograms measures 5.8 x 3.1 cm and a S.U.V. max of 9.9 on 98/3. Compare 5.2 x 3.0 cm at the same level on the prior exam. Low-level hypermetabolism corresponding to previously described right hilar and suprahilar adenopathy. Example at a S.U.V. max of 3.8 on 96/3. Adjacent right paratracheal node measures 8 mm and a S.U.V. max of 2.8 on 93/3. Incidental CT findings: Aortic and coronary artery atherosclerosis. Mild centrilobular emphysema. ABDOMEN/PELVIS: Low-level, mildly heterogeneous activity involves the exophytic splenic mass. Example 5.7 x 6.0 cm and a S.U.V. max of 3.0 on 151/3. No abdominopelvic nodal hypermetabolism. Incidental CT findings: Upper pole right renal 1.5 cm cyst. Abdominal aortic atherosclerosis. Retroaortic left renal vein. Normal adrenal glands. SKELETON: No abnormal marrow activity. Incidental CT findings: Cervical spine fixation. IMPRESSION: 1. Progression of  right upper lobe masslike consolidation with concurrent hypermetabolism. Given the extent of progression compared to the prior CT of less than 1 month ago, infection is favored. An aggressive malignancy could look similar. 2. Hypermetabolic right hilar and right mediastinal nodes, likely of the same etiology as the right upper lobe lung process. Potential clinical strategies include sampling versus CT follow-up at  4-6 weeks after antibiotic therapy. 3. Low-level heterogeneous activity involving the splenic mass, again favoring a benign etiology such as a hemangioma or lymphangioma. 4. Aortic atherosclerosis (ICD10-I70.0), coronary artery atherosclerosis and emphysema (ICD10-J43.9). Electronically Signed   By: Abigail Miyamoto M.D.   On: 07/12/2020 15:58   DG Chest Port 1 View  Result Date: 07/29/2020 CLINICAL DATA:  Status post bronchoscopy EXAM: PORTABLE CHEST 1 VIEW COMPARISON:  PET CT 07/11/2020, chest CT 06/14/2020, chest x-ray 04/25/2020 FINDINGS: Surgical hardware in the cervical spine. Elevated right diaphragm as before. Left lung is grossly clear. Right upper lobe airspace disease presumably corresponding to CT demonstrated consolidation. Patchy right perihilar airspace disease. Stable cardiomediastinal silhouette. No pneumothorax is seen. IMPRESSION: Right upper lobe airspace disease and patchy right perihilar airspace disease presumably corresponding to CT demonstrated consolidations. No pneumothorax. Electronically Signed   By: Donavan Foil M.D.   On: 07/29/2020 16:54   DG C-Arm 1-60 Min-No Report  Result Date: 07/29/2020 Fluoroscopy was utilized by the requesting physician.  No radiographic interpretation.    Labs:  CBC: Recent Labs    06/21/20 1159 07/25/20 1257 08/07/20 0841  WBC 13.3* 17.3* 13.6*  HGB 11.3* 10.2* 11.1*  HCT 36.4* 32.9* 35.8*  PLT 507* 660* 772*    COAGS: Recent Labs    08/07/20 0841  INR 1.1    BMP: Recent Labs    06/21/20 1159 07/25/20 1257  NA 134*  131*  K 3.9 3.4*  CL 99 94*  CO2 25 25  GLUCOSE 94 97  BUN 13 12  CALCIUM 8.8* 8.9  CREATININE 0.69 0.85  GFRNONAA >60 >60    LIVER FUNCTION TESTS: Recent Labs    06/21/20 1159  BILITOT 0.3  AST 9*  ALT 7  ALKPHOS 56  PROT 7.7  ALBUMIN 3.5    TUMOR MARKERS: No results for input(s): AFPTM, CEA, CA199, CHROMGRNA in the last 8760 hours.  Assessment and Plan:  65 year old male with indeterminate right upper lobe lung mass.  Plan for CT guided right upper lobe lung mass biopsy with moderate sedation.   Electronically Signed: Suzette Battiest, MD 08/07/2020, 9:21 AM   I spent a total of  15 Minutes   in face to face in clinical consultation, greater than 50% of which was counseling/coordinating care for lung biopsy.

## 2020-08-09 LAB — SURGICAL PATHOLOGY

## 2020-08-13 ENCOUNTER — Ambulatory Visit: Payer: Self-pay | Admitting: Family Medicine

## 2020-08-13 NOTE — Progress Notes (Deleted)
Established patient visit   Patient: Joshua Guerrero   DOB: [birth year]   65 y.o. Male  MRN: [id number] Visit Date: 08/13/2020  Today's healthcare provider: [name], MD   No chief complaint on file.  Subjective    HPI  Depression, Follow-up  He  was last seen for this 2 months ago. Changes made at last visit include; Stable.   He reports {excellent/good/fair/poor:19665} compliance with treatment. He {is/is not:21021397} having side effects. ***  He reports {DESC; GOOD/FAIR/POOR:18685} tolerance of treatment. Current symptoms include: {Symptoms; depression:1002} He feels he is {improved/worse/unchanged:3041574} since last visit.  Depression screen [facility] 2/9 03/12/2020 06/20/2019 05/16/2019  Decreased Interest 0 0 0  Down, Depressed, Hopeless 0 0 0  PHQ - 2 Score 0 0 0  Altered sleeping 0 0 -  Tired, decreased energy 0 0 -  Change in appetite 0 0 -  Feeling bad or failure about yourself  0 0 -  Trouble concentrating 0 0 -  Moving slowly or fidgety/restless 0 0 -  Suicidal thoughts 0 0 -  PHQ-9 Score 0 0 -  Difficult doing work/chores Not difficult at all Not difficult at all -  Some recent data might be hidden    -----------------------------------------------------------------------------------------  Chronic neck and back pain From 06/05/2020-Patient always gets some relief for a few days from Toradol.    {Show patient history (optional):23778::" "}   Medications: Outpatient Medications Prior to Visit  Medication Sig  . amoxicillin-clavulanate (AUGMENTIN) 875-125 MG tablet Take 1 tablet by mouth 2 (two) times daily.  . baclofen (LIORESAL) 10 MG tablet Take 10 mg by mouth 4 (four) times daily. STARTING AT LUNCHTIME  . desvenlafaxine (PRISTIQ) 100 MG 24 hr tablet Take 1 tablet (100 mg total) by mouth daily.  . fentaNYL (DURAGESIC - DOSED MCG/HR) 25 MCG/HR patch Place 1 patch onto the skin every 3 (three) days.  . hydrochlorothiazide (HYDRODIURIL) 25  MG tablet Take 1 tablet (25 mg total) by mouth daily.  . hydrOXYzine (ATARAX/VISTARIL) 50 MG tablet TAKE 1 TABLET BY MOUTH 3 TIMES DAILY AS NEEDED (USE AS NEEDED FOR SLEEP)  . lansoprazole (PREVACID) 30 MG capsule TAKE ONE CAPSULE BY MOUTH TWICE A DAY BEFORE A MEAL (Patient taking differently: Take 30 mg by mouth 2 (two) times daily before a meal.)  . metoprolol succinate (TOPROL-XL) 50 MG 24 hr tablet TAKE 1 TABLET BY MOUTH DAILY WITH OR IMMEDIATELY FOLLOWING A MEAL (Patient taking differently: Take 50 mg by mouth daily. TAKE 1 TABLET BY MOUTH DAILY WITH OR IMMEDIATELY FOLLOWING A MEAL)  . oxyCODONE-acetaminophen (PERCOCET) 10-325 MG tablet Take 1 tablet by mouth every 4 (four) hours. 3AM, 7AM, 11AM, 3 PM, 7PM AND 11PM  . prochlorperazine (COMPAZINE) 10 MG tablet Take 1 tablet (10 mg total) by mouth every 8 (eight) hours as needed for nausea or vomiting.  . promethazine (PHENERGAN) 25 MG tablet TAKE 1 TABLET BY MOUTH EVERY 8 HOURS AS NEEDED FOR NAUSEA OR VOMITING (Patient taking differently: Take 25 mg by mouth every 8 (eight) hours as needed for vomiting or nausea.)  . sucralfate (CARAFATE) 1 g tablet TAKE 1 TABLET BY MOUTH 4 TIMES A DAY WITH MEALS AND AT BEDTIME (Patient taking differently: Take 1 g by mouth 2 (two) times daily.)  . topiramate (TOPAMAX) 50 MG tablet Take 50 mg by mouth 2 (two) times daily.   No facility-administered medications prior to visit.    Review of Systems  Constitutional: Negative for appetite change, chills and  fever.  Respiratory: Negative for chest tightness, shortness of breath and wheezing.   Cardiovascular: Negative for chest pain and palpitations.  Gastrointestinal: Negative for abdominal pain, nausea and vomiting.    {Labs  Heme  Chem  Endocrine  Serology  Results Review (optional):23779::" "}   Objective    There were no vitals taken for this visit. {Show previous vital signs (optional):23777::" "}   Physical Exam  ***  No results found for any  visits on 08/13/20.  Assessment & Plan     ***  No follow-ups on file.      {provider attestation***:1}   [name], MD  [facility] [phone] (phone) [phone] (fax)  [place]

## 2020-08-14 ENCOUNTER — Inpatient Hospital Stay: Payer: PPO | Admitting: Oncology

## 2020-08-19 ENCOUNTER — Inpatient Hospital Stay: Payer: PPO | Attending: Oncology | Admitting: Oncology

## 2020-08-19 ENCOUNTER — Encounter: Payer: Self-pay | Admitting: Oncology

## 2020-08-19 ENCOUNTER — Other Ambulatory Visit: Payer: Self-pay

## 2020-08-19 VITALS — BP 129/74 | HR 73 | Temp 99.0°F | Resp 16 | Wt 171.2 lb

## 2020-08-19 DIAGNOSIS — F17210 Nicotine dependence, cigarettes, uncomplicated: Secondary | ICD-10-CM | POA: Diagnosis not present

## 2020-08-19 DIAGNOSIS — R918 Other nonspecific abnormal finding of lung field: Secondary | ICD-10-CM | POA: Diagnosis not present

## 2020-08-19 DIAGNOSIS — D509 Iron deficiency anemia, unspecified: Secondary | ICD-10-CM

## 2020-08-19 DIAGNOSIS — Z720 Tobacco use: Secondary | ICD-10-CM

## 2020-08-19 DIAGNOSIS — R161 Splenomegaly, not elsewhere classified: Secondary | ICD-10-CM | POA: Diagnosis not present

## 2020-08-19 NOTE — Progress Notes (Signed)
Hematology/Oncology Consult note [facility] Telephone:([phone] [phone]   Patient Care Team: Jerrol Banana., MD as PCP - General (Family Medicine) Jerrol Banana., MD (Family Medicine) Lucilla Lame, MD as Consulting Physician (Gastroenterology) Margaretha Sheffield, MD as Referring Physician (Physical Medicine and Rehabilitation) System, Provider Not In [place], RN as Oncology Nurse Navigator  REFERRING PROVIDER: Jerrol Banana.,*  CHIEF COMPLAINTS/REASON FOR VISIT:  Evaluation of lung mass and spleen mass  HISTORY OF PRESENTING ILLNESS:   Joshua Guerrero is a  65 y.o.  male with PMH listed below was seen in consultation at the request of  Jerrol Banana.,*  for evaluation of lung mass in the spleen mass.  Patient developed a cough and a chest pain in December 2021. 04/25/2020, chest x-ray showed obscuration in the right middle lobe which has some rounded features. Patient was treated by primary care provider for right middle lobe pneumonia with doxycycline. A follow-up CT scan was obtained on 06/16/2020 Irregular masslike focus of consolidation centered in the anteromedial basilar right upper lobe measuring 5.2 x 3.0 with some contiguous involvement on the other side of the major fissure in the right middle lobe. Mild right hilar lymphadenopathy.  Nonspecific. Indeterminate chronic lobulated solid partially calcified 7.4 cm left upper quadrant mass at the anterior inferior margin of the spleen.  Mildly increased in size comparing to 7 cm on 2020 CT and a 5.5 cm on remote 2009 CT.  Two-vessel coronary atherosclerosis.  Aortic atherosclerosis and emphysema.  Current everyday smoker. Patient reports that he has had weight loss due to pneumonia.  Recently has gained 3 pounds back. Appetite is fair.  No night sweating, fever, chills.   INTERVAL HISTORY Joshua Guerrero is a 65 y.o. male who has above history reviewed  by me today presents for follow up visit for management of lung mass and spleen mass. Problems and complaints are listed below: 07/29/2020, patient underwent biopsy via bronchoscopy.  Subcarinal 7R biopsies negative.  No evidence of malignancy.-Adequate sample of lymph node.  RUL brushing negative 08/07/2020, CT-guided biopsy of the right upper lobe, pathology showed necrotic inflammatory material, viable lung parenchyma is not present.  AFB, GMS, Gram stains are negative.  Polarizable material is not identified.  Patient reports some chronic nausea due to chronic use of fentanyl.  Both were previously managed by Dr. Rosanna Randy.  Review of Systems  Constitutional: Positive for fatigue. Negative for appetite change, chills, fever and unexpected weight change.  HENT:   Negative for hearing loss and voice change.   Eyes: Negative for eye problems and icterus.  Respiratory: Negative for chest tightness, cough and shortness of breath.   Cardiovascular: Negative for chest pain and leg swelling.  Gastrointestinal: Negative for abdominal distention and abdominal pain.  Endocrine: Negative for hot flashes.  Genitourinary: Negative for difficulty urinating, dysuria and frequency.   Musculoskeletal: Negative for arthralgias.  Skin: Negative for itching and rash.  Neurological: Negative for light-headedness and numbness.  Hematological: Negative for adenopathy. Does not bruise/bleed easily.  Psychiatric/Behavioral: Negative for confusion.    MEDICAL HISTORY:  Past Medical History:  Diagnosis Date  . Arthritis    lower back  . Brain bleed ([place]) 10/2016   AT [facility]-TREATED MEDICALLY NO SURGERY  . Confusion caused by a drug    fentanyl and oxycodone  . GERD (gastroesophageal reflux disease)   . Headache    past hx, secondary to nerve damage from accident  . Hypertension  CONTROLLED ON MEDS  . Neck pain, chronic   . Neuromuscular disorder ([place])    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  .  Sleep apnea    Not currently using a C-Pap    SURGICAL HISTORY: Past Surgical History:  Procedure Laterality Date  . COLONOSCOPY WITH PROPOFOL N/A 02/11/2015   Procedure: COLONOSCOPY WITH PROPOFOL;  Surgeon: Lucilla Lame, MD;  Location: [place];  Service: Endoscopy;  Laterality: N/A;  USES C-PAP  . ESOPHAGEAL DILATION N/A 10/14/2018   Procedure: ESOPHAGEAL DILATION;  Surgeon: Lucilla Lame, MD;  Location: [place];  Service: Endoscopy;  Laterality: N/A;  . ESOPHAGOGASTRODUODENOSCOPY (EGD) WITH PROPOFOL N/A 10/14/2018   Procedure: ESOPHAGOGASTRODUODENOSCOPY (EGD) WITH BIOPSY;  Surgeon: Lucilla Lame, MD;  Location: [place];  Service: Endoscopy;  Laterality: N/A;  sleep apnea  . NASAL SINUS SURGERY     due to severe sleep apnea  . NECK SURGERY     diffuse disc-and put in possibly screws patient thinks  . SHOULDER SURGERY Bilateral    x 2  . UMBILICAL HERNIA REPAIR N/A 02/26/2017   Primary repair.  Surgeon: Robert Bellow, MD;  Location: [facility];  Service: General;  Laterality: N/A;  . VIDEO BRONCHOSCOPY WITH ENDOBRONCHIAL NAVIGATION N/A 07/29/2020   Procedure: VIDEO BRONCHOSCOPY WITH ENDOBRONCHIAL NAVIGATION;  Surgeon: Tyler Pita, MD;  Location: [facility];  Service: Pulmonary;  Laterality: N/A;  . VIDEO BRONCHOSCOPY WITH ENDOBRONCHIAL ULTRASOUND N/A 07/29/2020   Procedure: VIDEO BRONCHOSCOPY WITH ENDOBRONCHIAL ULTRASOUND;  Surgeon: Tyler Pita, MD;  Location: [facility];  Service: Pulmonary;  Laterality: N/A;    SOCIAL HISTORY: Social History   Socioeconomic History  . Marital status: Divorced    Spouse name: single  . Number of children: 0  . Years of education: 64  . Highest education level: High school graduate  Occupational History  . Occupation: disability  Tobacco Use  . Smoking status: Current Every Day Smoker    Packs/day: 1.00    Years: 30.00    Pack years: 30.00    Types: Cigarettes  . Smokeless tobacco: Never Used  .  Tobacco comment: currently about 1/2 PPD--07/23/2020  Vaping Use  . Vaping Use: Never used  Substance and Sexual Activity  . Alcohol use: No  . Drug use: No  . Sexual activity: Never  Other Topics Concern  . Not on file  Social History Narrative  . Not on file   Social Determinants of Health   Financial Resource Strain: Low Risk   . Difficulty of Paying Living Expenses: Not hard at all  Food Insecurity: No Food Insecurity  . Worried About Charity fundraiser in the Last Year: Never true  . Ran Out of Food in the Last Year: Never true  Transportation Needs: No Transportation Needs  . Lack of Transportation (Medical): No  . Lack of Transportation (Non-Medical): No  Physical Activity: Inactive  . Days of Exercise per Week: 0 days  . Minutes of Exercise per Session: 0 min  Stress: No Stress Concern Present  . Feeling of Stress : Only a little  Social Connections: Moderately Isolated  . Frequency of Communication with Friends and Family: More than three times a week  . Frequency of Social Gatherings with Friends and Family: More than three times a week  . Attends Religious Services: More than 4 times per year  . Active Member of Clubs or Organizations: No  . Attends Archivist Meetings: Never  .  Marital Status: Divorced  Human resources officer Violence: Not At Risk  . Fear of Current or Ex-Partner: No  . Emotionally Abused: No  . Physically Abused: No  . Sexually Abused: No    FAMILY HISTORY: Family History  Problem Relation Age of Onset  . Dementia Father   . Melanoma Mother   . Cancer Paternal Uncle        unknown  . Cancer Paternal Uncle        unknown    ALLERGIES:  is allergic to acetaminophen, codeine, and opana [oxymorphone].  MEDICATIONS:  Current Outpatient Medications  Medication Sig Dispense Refill  . baclofen (LIORESAL) 10 MG tablet Take 10 mg by mouth 4 (four) times daily. STARTING AT LUNCHTIME    . desvenlafaxine (PRISTIQ) 100 MG 24 hr tablet Take  1 tablet (100 mg total) by mouth daily. 90 tablet 1  . fentaNYL (DURAGESIC - DOSED MCG/HR) 25 MCG/HR patch Place 1 patch onto the skin every 3 (three) days.    . hydrochlorothiazide (HYDRODIURIL) 25 MG tablet Take 1 tablet (25 mg total) by mouth daily. 90 tablet 1  . hydrOXYzine (ATARAX/VISTARIL) 50 MG tablet TAKE 1 TABLET BY MOUTH 3 TIMES DAILY AS NEEDED (USE AS NEEDED FOR SLEEP) 90 tablet 1  . lansoprazole (PREVACID) 30 MG capsule TAKE ONE CAPSULE BY MOUTH TWICE A DAY BEFORE A MEAL (Patient taking differently: Take 30 mg by mouth 2 (two) times daily before a meal.) 180 capsule 3  . metoprolol succinate (TOPROL-XL) 50 MG 24 hr tablet TAKE 1 TABLET BY MOUTH DAILY WITH OR IMMEDIATELY FOLLOWING A MEAL (Patient taking differently: Take 50 mg by mouth daily. TAKE 1 TABLET BY MOUTH DAILY WITH OR IMMEDIATELY FOLLOWING A MEAL) 90 tablet 1  . oxyCODONE-acetaminophen (PERCOCET) 10-325 MG tablet Take 1 tablet by mouth every 4 (four) hours. 3AM, 7AM, 11AM, 3 PM, 7PM AND 11PM    . prochlorperazine (COMPAZINE) 10 MG tablet Take 1 tablet (10 mg total) by mouth every 8 (eight) hours as needed for nausea or vomiting. 90 tablet 2  . promethazine (PHENERGAN) 25 MG tablet TAKE 1 TABLET BY MOUTH EVERY 8 HOURS AS NEEDED FOR NAUSEA OR VOMITING (Patient taking differently: Take 25 mg by mouth every 8 (eight) hours as needed for vomiting or nausea.) 60 tablet 3  . sucralfate (CARAFATE) 1 g tablet TAKE 1 TABLET BY MOUTH 4 TIMES A DAY WITH MEALS AND AT BEDTIME (Patient taking differently: Take 1 g by mouth 2 (two) times daily.) 120 tablet 11  . topiramate (TOPAMAX) 50 MG tablet Take 50 mg by mouth 2 (two) times daily.    Marland Kitchen amoxicillin-clavulanate (AUGMENTIN) 875-125 MG tablet Take 1 tablet by mouth 2 (two) times daily. (Patient not taking: Reported on 08/19/2020) 14 tablet 0   No current facility-administered medications for this visit.     PHYSICAL EXAMINATION: ECOG PERFORMANCE STATUS: 1 - Symptomatic but completely  ambulatory Vitals:   08/19/20 1403  BP: 129/74  Pulse: 73  Resp: 16  Temp: 99 F (37.2 C)   Filed Weights   08/19/20 1403  Weight: 171 lb 3.2 oz (77.7 kg)    Physical Exam Constitutional:      General: He is not in acute distress. HENT:     Head: Normocephalic and atraumatic.  Eyes:     General: No scleral icterus. Cardiovascular:     Rate and Rhythm: Normal rate and regular rhythm.     Heart sounds: Normal heart sounds.  Pulmonary:     Effort: Pulmonary  effort is normal. No respiratory distress.     Breath sounds: No wheezing.     Comments: Decreased breath sounds bilaterally Abdominal:     General: Bowel sounds are normal. There is no distension.     Palpations: Abdomen is soft.  Musculoskeletal:        General: No deformity. Normal range of motion.     Cervical back: Normal range of motion and neck supple.  Skin:    General: Skin is warm and dry.     Findings: No erythema or rash.  Neurological:     Mental Status: He is alert and oriented to person, place, and time. Mental status is at baseline.     Cranial Nerves: No cranial nerve deficit.     Coordination: Coordination normal.  Psychiatric:        Mood and Affect: Mood normal.     LABORATORY DATA:  I have reviewed the data as listed Lab Results  Component Value Date   WBC 13.6 (H) 08/07/2020   HGB 11.1 (L) 08/07/2020   HCT 35.8 (L) 08/07/2020   MCV 74.9 (L) 08/07/2020   PLT 772 (H) 08/07/2020   Recent Labs    06/21/20 1159 07/25/20 1257  NA 134* 131*  K 3.9 3.4*  CL 99 94*  CO2 25 25  GLUCOSE 94 97  BUN 13 12  CREATININE 0.69 0.85  CALCIUM 8.8* 8.9  GFRNONAA >60 >60  PROT 7.7  --   ALBUMIN 3.5  --   AST 9*  --   ALT 7  --   ALKPHOS 56  --   BILITOT 0.3  --    Iron/TIBC/Ferritin/ %Sat No results found for: IRON, TIBC, FERRITIN, IRONPCTSAT    RADIOGRAPHIC STUDIES: I have personally reviewed the radiological images as listed and agreed with the findings in the report. CT  BIOPSY  Result Date: 08/07/2020 INDICATION: 65 year old male with indeterminate right upper lung mass. EXAM: CT-guided lung biopsy COMPARISON:  07/11/2020 MEDICATIONS: None. ANESTHESIA/SEDATION: Fentanyl 100 mcg IV; Versed 2 mg IV Sedation time: 12 minutes; The patient was continuously monitored during the procedure by the interventional radiology nurse under my direct supervision. CONTRAST:  None COMPLICATIONS: None immediate. PROCEDURE: Informed consent was obtained from the patient following an explanation of the procedure, risks, benefits and alternatives. The patient understands,agrees and consents for the procedure. All questions were addressed. A time out was performed prior to the initiation of the procedure. The patient was positioned supine on the CT table and a limited chest CT was performed for procedural planning demonstrating similar appearing solid right upper lobe lung mass with several internal cystic areas. The operative site was prepped and draped in the usual sterile fashion. Under sterile conditions and local anesthesia, a 17 gauge coaxial needle was advanced into the peripheral aspect of the nodule. Positioning was confirmed with intermittent CT fluoroscopy and followed by the acquisition of a total of 4 samples with an 18 gauge core needle biopsy device. The coaxial needle was removed and superficial hemostasis was achieved with manual compression. Limited post procedural chest CT was negative for pneumothorax or additional complication. A dressing was placed. The patient tolerated the procedure well without immediate postprocedural complication. The patient was escorted to have an upright chest radiograph. IMPRESSION: Technically successful CT guided core needle core biopsy of right upper lobe lung mass. Ruthann Cancer, MD [facility] Radiology Electronically Signed   By: Ruthann Cancer MD   On: 08/07/2020 13:56   DG  Chest Port 1 View  Result  Date: 08/07/2020 CLINICAL DATA:  Status post right lung mass biopsy. EXAM: PORTABLE CHEST 1 VIEW COMPARISON:  CT 08/07/2020 06/14/2020.  Chest x-ray 07/29/2020. FINDINGS: No evidence of pneumothorax post right lung mass biopsy. Right lung mass appears stable. Persistent density right mid lung. Nipple shadow on the left noted. No pleural effusion. Cervical spine fusion. IMPRESSION: No evidence of pneumothorax post right lung mass biopsy Electronically Signed   By: Marcello Moores  Register   On: 08/07/2020 12:24   DG Chest Port 1 View  Result Date: 07/29/2020 CLINICAL DATA:  Status post bronchoscopy EXAM: PORTABLE CHEST 1 VIEW COMPARISON:  PET CT 07/11/2020, chest CT 06/14/2020, chest x-ray 04/25/2020 FINDINGS: Surgical hardware in the cervical spine. Elevated right diaphragm as before. Left lung is grossly clear. Right upper lobe airspace disease presumably corresponding to CT demonstrated consolidation. Patchy right perihilar airspace disease. Stable cardiomediastinal silhouette. No pneumothorax is seen. IMPRESSION: Right upper lobe airspace disease and patchy right perihilar airspace disease presumably corresponding to CT demonstrated consolidations. No pneumothorax. Electronically Signed   By: Donavan Foil M.D.   On: 07/29/2020 16:54   DG C-Arm 1-60 Min-No Report  Result Date: 07/29/2020 Fluoroscopy was utilized by the requesting physician.  No radiographic interpretation.      ASSESSMENT & PLAN:  1. Lung mass   2. Splenic mass   3. Microcytic anemia   4. Tobacco use    #Lung mass 310 PET images were independently reviewed by me and discussed with patient Progression of right upper lobe masslike consolidation with concurrent hypermetabolic activity. Infectious-obstructive pneumonia versus progressive malignancy. Hypermetabolic right hilar and right mediastinal nodes. Status post biopsy x2 both failed to detect malignancy. Probably reactive process. Recommend to repeat CT scan in 6 to 8 weeks. We  will communicate with Dr. Patsey Berthold to see if he will need any additional steroids/antibiotics treatments. He recently finished a course of Augmentin   #Splenic mass, low-level heterogeneous activity involving the splenic mass.  Favoring benign etiology such as hemangioma or lymphangioma.I will discussed his case on tumor board.  #Unintentional weight loss, p unclear etiology. # Tobacco use, smoke cessation Microcytic anemia-need to check iron TIBC ferritin.  Last colonoscopy was 02/11/2015 At that time, patient has had an erythematous mucosa in the transverse colon, hepatic flexure, ascending colon and cecum.  Ischemic colitis.  Polyps.  Pathology report was not available to me. Check iron TIBC ferritin at the next visit.  All questions were answered. The patient knows to call the clinic with any problems questions or concerns.  cc Jerrol Banana.,*    Return of visit: Follow-up after CT in 6 to 8 weeks.  Earlie Server, MD, PhD Hematology Oncology [facility] at [facility] Pager- [phone] 08/19/2020

## 2020-08-19 NOTE — Progress Notes (Signed)
Patient reports that his chronic pain in multiple locations is 8/10 on pain scale today.

## 2020-08-20 ENCOUNTER — Other Ambulatory Visit: Payer: Self-pay

## 2020-08-20 ENCOUNTER — Telehealth: Payer: Self-pay

## 2020-08-20 NOTE — Telephone Encounter (Signed)
Pt notified and is scheduled for labs on 4/21 @ 11:30am.

## 2020-08-20 NOTE — Progress Notes (Signed)
Error

## 2020-08-20 NOTE — Telephone Encounter (Signed)
-----   Message from Earlie Server, MD sent at 08/19/2020  8:07 PM EDT ----- Is let patient know that I noticed that his blood count has been low.  I recommend patient to get additional lab work-up for microcytic anemia.  Check CBC, reticulocyte panel, iron TIBC ferritin.  All ordered.

## 2020-08-21 ENCOUNTER — Telehealth: Payer: Self-pay

## 2020-08-21 ENCOUNTER — Telehealth: Payer: Self-pay | Admitting: Pulmonary Disease

## 2020-08-21 DIAGNOSIS — J8489 Other specified interstitial pulmonary diseases: Secondary | ICD-10-CM

## 2020-08-21 MED ORDER — AMOXICILLIN-POT CLAVULANATE 875-125 MG PO TABS: 1.0000 | ORAL_TABLET | Freq: Two times a day (BID) | ORAL | 0 refills | Status: DC

## 2020-08-21 MED ORDER — PREDNISONE 20 MG PO TABS: 20.0000 mg | ORAL_TABLET | Freq: Every day | ORAL | 0 refills | Status: DC

## 2020-08-21 NOTE — Telephone Encounter (Signed)
I spoke with the patient.  His most recent biopsy is consistent with the prior bronchoscopic biopsy.  He likely has ORGANIZING PNEUMONIA.  He understands that he will need steroids for treatment.  I recommend Biaxin 500 mg twice daily x7 days.  Start prednisone 20 mg daily.  Prednisone will be continued until reevaluated at the clinic on 6 May.  At that time we will determine length of therapy with prednisone.  He will need a chest x-ray that day of return.

## 2020-08-21 NOTE — Telephone Encounter (Signed)
Patient is calling for results of bx.  Dr. Patsey Berthold, please advise. Thanks

## 2020-08-21 NOTE — Telephone Encounter (Signed)
DRUG TO DRUG WITH BIAXIN AND FENTANYL.  Per Dr. Joellyn Haff Augmentin 875mg  BID x 7 days.   Prednisone, Augmentin and CXR has been ordered.  Patient is aware and voiced his understanding.  Nothing further needed at this time.

## 2020-08-21 NOTE — Telephone Encounter (Signed)
disregard

## 2020-08-22 ENCOUNTER — Inpatient Hospital Stay: Payer: PPO

## 2020-08-22 DIAGNOSIS — R918 Other nonspecific abnormal finding of lung field: Secondary | ICD-10-CM | POA: Diagnosis not present

## 2020-08-22 DIAGNOSIS — Z720 Tobacco use: Secondary | ICD-10-CM

## 2020-08-22 DIAGNOSIS — D509 Iron deficiency anemia, unspecified: Secondary | ICD-10-CM

## 2020-08-22 DIAGNOSIS — R161 Splenomegaly, not elsewhere classified: Secondary | ICD-10-CM

## 2020-08-22 LAB — CBC WITH DIFFERENTIAL/PLATELET
Abs Immature Granulocytes: 0.07 10*3/uL (ref 0.00–0.07)
Basophils Absolute: 0.1 10*3/uL (ref 0.0–0.1)
Basophils Relative: 1 %
Eosinophils Absolute: 0.2 10*3/uL (ref 0.0–0.5)
Eosinophils Relative: 1 %
HCT: 33.2 % — ABNORMAL LOW (ref 39.0–52.0)
Hemoglobin: 10.2 g/dL — ABNORMAL LOW (ref 13.0–17.0)
Immature Granulocytes: 1 %
Lymphocytes Relative: 7 %
Lymphs Abs: 0.8 10*3/uL (ref 0.7–4.0)
MCH: 23.1 pg — ABNORMAL LOW (ref 26.0–34.0)
MCHC: 30.7 g/dL (ref 30.0–36.0)
MCV: 75.3 fL — ABNORMAL LOW (ref 80.0–100.0)
Monocytes Absolute: 0.6 10*3/uL (ref 0.1–1.0)
Monocytes Relative: 5 %
Neutro Abs: 10.2 10*3/uL — ABNORMAL HIGH (ref 1.7–7.7)
Neutrophils Relative %: 85 %
Platelets: 456 10*3/uL — ABNORMAL HIGH (ref 150–400)
RBC: 4.41 MIL/uL (ref 4.22–5.81)
RDW: 17.5 % — ABNORMAL HIGH (ref 11.5–15.5)
WBC: 12 10*3/uL — ABNORMAL HIGH (ref 4.0–10.5)
nRBC: 0 % (ref 0.0–0.2)

## 2020-08-22 LAB — IRON AND TIBC
Iron: 17 ug/dL — ABNORMAL LOW (ref 45–182)
Saturation Ratios: 6 % — ABNORMAL LOW (ref 17.9–39.5)
TIBC: 302 ug/dL (ref 250–450)
UIBC: 285 ug/dL

## 2020-08-22 LAB — RETIC PANEL
Immature Retic Fract: 26.9 % — ABNORMAL HIGH (ref 2.3–15.9)
RBC.: 4.62 MIL/uL (ref 4.22–5.81)
Retic Count, Absolute: 76.7 10*3/uL (ref 19.0–186.0)
Retic Ct Pct: 1.7 % (ref 0.4–3.1)
Reticulocyte Hemoglobin: 22.9 pg — ABNORMAL LOW (ref >27.9)

## 2020-08-22 LAB — FERRITIN: Ferritin: 18 ng/mL — ABNORMAL LOW (ref 24–336)

## 2020-08-23 DIAGNOSIS — M47817 Spondylosis without myelopathy or radiculopathy, lumbosacral region: Secondary | ICD-10-CM | POA: Diagnosis not present

## 2020-08-23 DIAGNOSIS — M961 Postlaminectomy syndrome, not elsewhere classified: Secondary | ICD-10-CM | POA: Diagnosis not present

## 2020-08-23 DIAGNOSIS — G894 Chronic pain syndrome: Secondary | ICD-10-CM | POA: Diagnosis not present

## 2020-08-23 DIAGNOSIS — M47812 Spondylosis without myelopathy or radiculopathy, cervical region: Secondary | ICD-10-CM | POA: Diagnosis not present

## 2020-08-24 MED ORDER — VITRON-C 65-125 MG PO TABS: 1.0000 | ORAL_TABLET | Freq: Every day | ORAL | 0 refills | Status: DC

## 2020-08-24 NOTE — Addendum Note (Signed)
Addended by: Earlie Server on: 08/24/2020 03:11 PM   Modules accepted: Orders

## 2020-08-26 ENCOUNTER — Telehealth: Payer: Self-pay

## 2020-08-26 DIAGNOSIS — D509 Iron deficiency anemia, unspecified: Secondary | ICD-10-CM

## 2020-08-26 NOTE — Telephone Encounter (Signed)
-----   Message from Earlie Server, MD sent at 08/24/2020  3:12 PM EDT ----- Vitron c Rx was sent

## 2020-08-26 NOTE — Telephone Encounter (Signed)
Spoke with patient and he is aware of results

## 2020-08-26 NOTE — Telephone Encounter (Addendum)
Left message informing patient of MD advice on lab results:  His lab showed IDA, please advise him to start oral iron supplementation.  Follow up with me lab iron labs prior- please order, MD in 3 months.   Please schedule as MD recommends and inform him of appt details.

## 2020-08-26 NOTE — Telephone Encounter (Signed)
08/26/2020 Spoke w/ pts and informed him of 3 month f/u appts per MD, on 7/25 for labs and 7/26 for MD. Pt confirmed appts  SRW

## 2020-08-29 ENCOUNTER — Encounter: Payer: Self-pay | Admitting: *Deleted

## 2020-09-06 ENCOUNTER — Other Ambulatory Visit: Payer: Self-pay

## 2020-09-06 ENCOUNTER — Ambulatory Visit
Admission: RE | Admit: 2020-09-06 | Discharge: 2020-09-06 | Disposition: A | Discharge status: Home / Self Care | Payer: PPO | Attending: Pulmonary Disease | Admitting: Pulmonary Disease

## 2020-09-06 ENCOUNTER — Ambulatory Visit: Payer: PPO | Admitting: Pulmonary Disease

## 2020-09-06 ENCOUNTER — Ambulatory Visit
Admission: RE | Admit: 2020-09-06 | Discharge: 2020-09-06 | Disposition: A | Discharge status: Home / Self Care | Payer: PPO | Source: Ambulatory Visit | Attending: Pulmonary Disease | Admitting: Pulmonary Disease

## 2020-09-06 ENCOUNTER — Encounter: Payer: Self-pay | Admitting: Pulmonary Disease

## 2020-09-06 VITALS — BP 132/70 | HR 67 | Temp 97.7°F | Ht 69.5 in | Wt 171.8 lb

## 2020-09-06 DIAGNOSIS — J84116 Cryptogenic organizing pneumonia: Secondary | ICD-10-CM | POA: Diagnosis not present

## 2020-09-06 DIAGNOSIS — R918 Other nonspecific abnormal finding of lung field: Secondary | ICD-10-CM | POA: Diagnosis not present

## 2020-09-06 DIAGNOSIS — F17210 Nicotine dependence, cigarettes, uncomplicated: Secondary | ICD-10-CM

## 2020-09-06 DIAGNOSIS — J8489 Other specified interstitial pulmonary diseases: Secondary | ICD-10-CM

## 2020-09-06 DIAGNOSIS — Z8701 Personal history of pneumonia (recurrent): Secondary | ICD-10-CM | POA: Diagnosis not present

## 2020-09-06 MED ORDER — PREDNISONE 10 MG PO TABS: 10.0000 mg | ORAL_TABLET | Freq: Every day | ORAL | 1 refills | Status: DC

## 2020-09-06 NOTE — Patient Instructions (Signed)
We are going to decrease her prednisone to 10 mg daily for another week.  After 1 week go to 10 mg (1 tablet) every other day.  You will do this until you see [place] in follow-up again.   We will see you in follow-up in 3 to 4 weeks time with me or the nurse practitioner and a chest x-ray at the time of your follow-up.

## 2020-09-06 NOTE — Progress Notes (Signed)
Subjective:    Patient ID: Joshua Guerrero, male    DOB: January 29, 1956, 65 y.o.   MRN: [id number]  HPI Patient is a 65 year old current smoker (half PPD) who presents for follow-up of right lung "mass" and abnormal chest CT.  The patient states that he started having right-sided chest pain since around October 2021 which has gotten steadily worse.  A chest x-ray performed on 25 April 2020 showed an opacity on the right lung which had some rounded features.  He was treated with antibiotics at that time for potential pneumonia.  He had a CT scan of the chest performed in February that showed a potential mass and consolidation.  Patient was referred to oncology at that time.  A PET/CT was ordered at that time which shows this lesion to be highly PET avid.  The patient has also noted weight loss and anorexia.  No fevers, chills or sweats.  No purulent sputum production.  No hemoptysis.  Findings at the time were concerning for a primary lung cancer.  I went bronchoscopy with navigational assistance on 29 July 2020.  The findings at that time were of inflammation and nondiagnostic.  He then underwent CT-guided needle biopsy of this area on 07 August 2020 which was consistent with the bronchoscopy findings.  The likely diagnosis is then organizing pneumonia.  The patient was then treated with and prednisone.  He is still completing his prednisone course.  He is on 20 mg of prednisone.  His only issues are anxiety with the prednisone.  He notes no cough or sputum production.  This has improved dramatically with the antibiotic course and now with the prednisone.  He has not had any fevers, chills or sweats.  X-ray today was discussed with the patient, shows marked improvement on the prior findings.  He continues to complain of weight loss though he does not have anorexia.  From my standpoint I cannot ascribe his weight loss to his chest findings which are improved.  I have encouraged him to discuss these issues  with his primary physician.   Review of Systems A 10 point review of systems was performed and it is as noted above otherwise negative.  Patient Active Problem List   Diagnosis Date Noted  . Mild episode of recurrent major depressive disorder ([place]) 06/05/2020  . Interstitial pneumonia ([place]) 06/05/2020  . Chronic nausea   . Acute peptic ulcer of stomach   . Stricture and stenosis of esophagus   . Hypokalemia 05/26/2016  . Salicylate overdose 94/85/4627  . Hypogammaglobulinemia ([place]) 12/19/2015  . Benign prostatic hyperplasia 11/11/2015  . Personal history of colonic polyps   . Disease of colon   . Benign neoplasm of descending colon   . Benign neoplasm of sigmoid colon   . Anxiety 09/22/2014  . 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 Back pain, chronic 09/22/2014  . Chronic neck and back pain 09/22/2014  . Clinical depression 09/22/2014  . Acid reflux 09/22/2014  . HLD (hyperlipidemia) 09/22/2014  . BP (high blood pressure) 09/22/2014  . Iron deficiency 09/22/2014  . Apnea, sleep 09/22/2014  . Umbilical hernia without obstruction and without gangrene 09/22/2014   Social History   Tobacco Use  . Smoking status: Current Every Day Smoker    Packs/day: 1.00    Years: 30.00    Pack years: 30.00    Types: Cigarettes  . Smokeless tobacco: Never Used  . Tobacco comment: currently about 1/2 PPD--09/06/2020  Substance Use Topics  . Alcohol use:  No   Allergies  Allergen Reactions  . Acetaminophen Shortness Of Breath  . Codeine Itching and Nausea And Vomiting  . Opana [Oxymorphone] Swelling   Current Meds  Medication Sig  . amoxicillin-clavulanate (AUGMENTIN) 875-125 MG tablet Take 1 tablet by mouth 2 (two) times daily.  . baclofen (LIORESAL) 10 MG tablet Take 10 mg by mouth 4 (four) times daily. STARTING AT LUNCHTIME  . desvenlafaxine (PRISTIQ) 100 MG 24 hr tablet Take 1 tablet (100 mg total) by mouth daily.  . fentaNYL (DURAGESIC - DOSED MCG/HR) 25 MCG/HR patch Place 1  patch onto the skin every 3 (three) days.  . hydrochlorothiazide (HYDRODIURIL) 25 MG tablet Take 1 tablet (25 mg total) by mouth daily.  . hydrOXYzine (ATARAX/VISTARIL) 50 MG tablet TAKE 1 TABLET BY MOUTH 3 TIMES DAILY AS NEEDED (USE AS NEEDED FOR SLEEP)  . Iron-Vitamin C (VITRON-C) 65-125 MG TABS Take 1 tablet by mouth daily.  . lansoprazole (PREVACID) 30 MG capsule TAKE ONE CAPSULE BY MOUTH TWICE A DAY BEFORE A MEAL (Patient taking differently: Take 30 mg by mouth 2 (two) times daily before a meal.)  . metoprolol succinate (TOPROL-XL) 50 MG 24 hr tablet TAKE 1 TABLET BY MOUTH DAILY WITH OR IMMEDIATELY FOLLOWING A MEAL (Patient taking differently: Take 50 mg by mouth daily. TAKE 1 TABLET BY MOUTH DAILY WITH OR IMMEDIATELY FOLLOWING A MEAL)  . oxyCODONE-acetaminophen (PERCOCET) 10-325 MG tablet Take 1 tablet by mouth every 4 (four) hours. 3AM, 7AM, 11AM, 3 PM, 7PM AND 11PM  . prochlorperazine (COMPAZINE) 10 MG tablet Take 1 tablet (10 mg total) by mouth every 8 (eight) hours as needed for nausea or vomiting.  . promethazine (PHENERGAN) 25 MG tablet TAKE 1 TABLET BY MOUTH EVERY 8 HOURS AS NEEDED FOR NAUSEA OR VOMITING (Patient taking differently: Take 25 mg by mouth every 8 (eight) hours as needed for vomiting or nausea.)  . sucralfate (CARAFATE) 1 g tablet TAKE 1 TABLET BY MOUTH 4 TIMES A DAY WITH MEALS AND AT BEDTIME (Patient taking differently: Take 1 g by mouth 2 (two) times daily.)  . topiramate (TOPAMAX) 50 MG tablet Take 50 mg by mouth 2 (two) times daily.  .  predniSONE (DELTASONE) 20 MG tablet Take 1 tablet (20 mg total) by mouth daily with breakfast.   Immunization History  Administered Date(s) Administered  . Influenza,inj,Quad PF,6+ Mos 02/20/2015, 05/26/2016, 12/23/2016, 03/07/2018, 03/22/2019, 04/25/2020  . PFIZER(Purple Top)SARS-COV-2 Vaccination 08/31/2019, 09/28/2019, 03/12/2020  . Pneumococcal Polysaccharide-23 05/26/2016  . Td 08/17/2018  . Tdap 07/31/2008       Objective:    Physical Exam BP 132/70 (BP Location: Left Arm, Patient Position: Sitting, Cuff Size: Normal)   Pulse 67   Temp 97.7 F (36.5 C) (Temporal)   Ht 5' 9.5" (1.765 m)   Wt 171 lb 12.8 oz (77.9 kg)   SpO2 98%   BMI 25.01 kg/m   GENERAL: Well-developed, well-nourished gentleman, in no acute distress. HEAD: Normocephalic, atraumatic.  EYES: Pupils equal, round, reactive to light.  No scleral icterus.  MOUTH:  Nose/mouth/throat not examined due to masking requirements for COVID 19. NECK: Supple. No thyromegaly. Trachea midline. No JVD.  No adenopathy. PULMONARY: Good air entry bilaterally.    Coarse, otherwise no adventitious sounds. CARDIOVASCULAR: S1 and S2. Regular rate and rhythm.  No rubs murmurs or gallops heard. ABDOMEN: Benign. MUSCULOSKELETAL: No joint deformity, no clubbing, no edema.  NEUROLOGIC: No focal deficit, no gait disturbance, speech is fluent. SKIN: Intact,warm,dry.  On limited exam no rashes. PSYCH: Mood  and behavior normal.  Chest x-ray from 29 July 2020:      Chest x-ray performed today after steroid therapy:   Assessment & Plan:     ICD-10-CM   1. Cryptogenic organizing pneumonia ([place])  J84.116 DG Chest 2 View   Patient's x-ray markedly improved Decrease prednisone to 10 mg daily x1 week Then  prednisone 10 mg daily until seen again Follow-up 3 to 4 weeks with CXR  2. Tobacco dependence due to cigarettes  F17.210    Patient was counseled regards to discontinuation of smoking States he cannot quit smoking until he gets of steroids Steroids are causing some anxiety   Orders Placed This Encounter  Procedures  . DG Chest 2 View    Standing Status:   Future    Standing Expiration Date:   09/06/2021    Scheduling Instructions:     cxr needed before appt    Order Specific Question:   Reason for Exam (SYMPTOM  OR DIAGNOSIS REQUIRED)    Answer:   f/u pneumonia    Order Specific Question:   Preferred imaging location?    Answer:   [place]  ordered this encounter  Medications  . predniSONE (DELTASONE) 10 MG tablet    Sig: Take 1 tablet (10 mg total) by mouth daily with breakfast.    Dispense:  30 tablet    Refill:  1   C. Derrill Kay, MD [facility]   *This note was dictated using voice recognition software/Dragon.  Despite best efforts to proofread, errors can occur which can change the meaning.  Any change was purely unintentional.

## 2020-09-18 DIAGNOSIS — M47817 Spondylosis without myelopathy or radiculopathy, lumbosacral region: Secondary | ICD-10-CM | POA: Diagnosis not present

## 2020-09-18 DIAGNOSIS — G894 Chronic pain syndrome: Secondary | ICD-10-CM | POA: Diagnosis not present

## 2020-09-18 DIAGNOSIS — M961 Postlaminectomy syndrome, not elsewhere classified: Secondary | ICD-10-CM | POA: Diagnosis not present

## 2020-09-18 DIAGNOSIS — M47812 Spondylosis without myelopathy or radiculopathy, cervical region: Secondary | ICD-10-CM | POA: Diagnosis not present

## 2020-09-23 ENCOUNTER — Other Ambulatory Visit: Payer: Self-pay | Admitting: Family Medicine

## 2020-09-23 DIAGNOSIS — F32A Depression, unspecified: Secondary | ICD-10-CM

## 2020-09-23 NOTE — Telephone Encounter (Signed)
Requested medication (s) are due for refill today - yes  Requested medication (s) are on the active medication list -yes  Future visit scheduled -no  Last refill: 08/24/20  Notes to clinic: Request RF- meets visit criteria- but last lab for rx- 08/25/17. Request sent for review   Requested Prescriptions  Pending Prescriptions Disp Refills   desvenlafaxine (PRISTIQ) 100 MG 24 hr tablet [Pharmacy Med Name: DESVENLAFAXINE SUCCINATE ER 100 MG] 90 tablet 1    Sig: TAKE 1 TABLET BY MOUTH DAILY      Psychiatry: Antidepressants - SNRI - desvenlafaxine & venlafaxine Failed - 09/23/2020  9:50 AM      Failed - LDL in normal range and within 360 days    LDL Calculated  Date Value Ref Range Status  08/25/2017 113 (H) 0 - 99 mg/dL Final          Failed - Total Cholesterol in normal range and within 360 days    Cholesterol, Total  Date Value Ref Range Status  08/25/2017 175 100 - 199 mg/dL Final          Failed - Triglycerides in normal range and within 360 days    Triglycerides  Date Value Ref Range Status  08/25/2017 180 (H) 0 - 149 mg/dL Final          Passed - Completed PHQ-2 or PHQ-9 in the last 360 days      Passed - Last BP in normal range    BP Readings from Last 1 Encounters:  09/06/20 132/70          Passed - Valid encounter within last 6 months    Recent Outpatient Visits           3 months ago Mass in chest   [facility] Jerrol Banana., MD   3 months ago Chest pain, unspecified type   [facility] Jerrol Banana., MD   3 months ago Chest pain, unspecified type   [facility] Jerrol Banana., MD   5 months ago Chest pain, unspecified type   [facility] Jerrol Banana., MD   6 months ago Encounter for immunization   [facility] Rosanna Randy, Retia Passe., MD                    Requested Prescriptions  Pending Prescriptions Disp Refills   desvenlafaxine  (PRISTIQ) 100 MG 24 hr tablet [Pharmacy Med Name: DESVENLAFAXINE SUCCINATE ER 100 MG] 90 tablet 1    Sig: TAKE 1 TABLET BY MOUTH DAILY      Psychiatry: Antidepressants - SNRI - desvenlafaxine & venlafaxine Failed - 09/23/2020  9:50 AM      Failed - LDL in normal range and within 360 days    LDL Calculated  Date Value Ref Range Status  08/25/2017 113 (H) 0 - 99 mg/dL Final          Failed - Total Cholesterol in normal range and within 360 days    Cholesterol, Total  Date Value Ref Range Status  08/25/2017 175 100 - 199 mg/dL Final          Failed - Triglycerides in normal range and within 360 days    Triglycerides  Date Value Ref Range Status  08/25/2017 180 (H) 0 - 149 mg/dL Final          Passed - Completed PHQ-2 or PHQ-9 in the last 360 days  Passed - Last BP in normal range    BP Readings from Last 1 Encounters:  09/06/20 132/70          Passed - Valid encounter within last 6 months    Recent Outpatient Visits           3 months ago Mass in chest   [facility] Jerrol Banana., MD   3 months ago Chest pain, unspecified type   [facility] Jerrol Banana., MD   3 months ago Chest pain, unspecified type   [facility] Jerrol Banana., MD   5 months ago Chest pain, unspecified type   [facility] Jerrol Banana., MD   6 months ago Encounter for immunization   [facility] Jerrol Banana., MD

## 2020-09-24 ENCOUNTER — Other Ambulatory Visit: Payer: Self-pay | Admitting: Family Medicine

## 2020-09-24 DIAGNOSIS — I10 Essential (primary) hypertension: Secondary | ICD-10-CM

## 2020-09-24 NOTE — Telephone Encounter (Signed)
Pt called asking about his script and if it was going to be sent in today.  He said he is on prednisone and this helps him to keep level.

## 2020-10-07 ENCOUNTER — Ambulatory Visit: Payer: PPO

## 2020-10-21 DIAGNOSIS — M47812 Spondylosis without myelopathy or radiculopathy, cervical region: Secondary | ICD-10-CM | POA: Diagnosis not present

## 2020-10-21 DIAGNOSIS — M47817 Spondylosis without myelopathy or radiculopathy, lumbosacral region: Secondary | ICD-10-CM | POA: Diagnosis not present

## 2020-10-21 DIAGNOSIS — M961 Postlaminectomy syndrome, not elsewhere classified: Secondary | ICD-10-CM | POA: Diagnosis not present

## 2020-10-21 DIAGNOSIS — G894 Chronic pain syndrome: Secondary | ICD-10-CM | POA: Diagnosis not present

## 2020-10-25 ENCOUNTER — Other Ambulatory Visit: Payer: Self-pay | Admitting: Family Medicine

## 2020-10-25 DIAGNOSIS — K219 Gastro-esophageal reflux disease without esophagitis: Secondary | ICD-10-CM

## 2020-10-25 NOTE — Telephone Encounter (Signed)
Requested medication (s) are due for refill today: Yes  Requested medication (s) are on the active medication list: Yes  Last refill:  10/03/19  Future visit scheduled: No  Notes to clinic:  Prescription expired.    Requested Prescriptions  Pending Prescriptions Disp Refills   lansoprazole (PREVACID) 30 MG capsule [Pharmacy Med Name: LANSOPRAZOLE 30 MG CAP] 180 capsule 3    Sig: TAKE ONE CAPSULE BY MOUTH TWICE A DAY BEFORE A MEAL      Gastroenterology: Proton Pump Inhibitors Passed - 10/25/2020  1:03 PM      Passed - Valid encounter within last 12 months    Recent Outpatient Visits           4 months ago Mass in chest   [facility] Jerrol Banana., MD   4 months ago Chest pain, unspecified type   [facility] Jerrol Banana., MD   4 months ago Chest pain, unspecified type   [facility] Jerrol Banana., MD   6 months ago Chest pain, unspecified type   [facility] Jerrol Banana., MD   7 months ago Encounter for immunization   [facility] Jerrol Banana., MD

## 2020-10-28 ENCOUNTER — Other Ambulatory Visit: Payer: Self-pay | Admitting: Family Medicine

## 2020-10-28 DIAGNOSIS — G4733 Obstructive sleep apnea (adult) (pediatric): Secondary | ICD-10-CM

## 2020-11-05 ENCOUNTER — Other Ambulatory Visit: Payer: Self-pay | Admitting: Family Medicine

## 2020-11-05 ENCOUNTER — Telehealth: Payer: Self-pay | Admitting: Pulmonary Disease

## 2020-11-05 DIAGNOSIS — I10 Essential (primary) hypertension: Secondary | ICD-10-CM

## 2020-11-05 MED ORDER — PREDNISONE 20 MG PO TABS: 20.0000 mg | ORAL_TABLET | Freq: Every day | ORAL | 0 refills | Status: DC

## 2020-11-05 MED ORDER — AZITHROMYCIN 250 MG PO TABS: ORAL_TABLET | ORAL | 0 refills | Status: AC

## 2020-11-05 NOTE — Telephone Encounter (Signed)
Lm x1 for patient.  

## 2020-11-05 NOTE — Telephone Encounter (Signed)
Patient is aware of recommendations and voiced his understanding.  Rx for prednisone and Zpak has been sent to preferred pharmacy. Nothing further needed at this time.

## 2020-11-05 NOTE — Telephone Encounter (Signed)
Spoke with the pt  He states that he finished pred about a wk ago and then symptoms started to return  He states his chest feels very tight all the time, and having increased DOE  He is coughing up some yellow sputum  He states no f/c/s, aches, sweats  He feels discouraged that symptoms are back off of steroids  Looking back at his last note from 09/06/20, he was supposed to have taken 10 mg daily x 1 wk and then qod until f/u  He never made his 3-4 wk f/u as Dr Darnell Level recommended and has taken 10 mg pred daily until ran out approx 1 wk ago  I scheduled him appt with Dr Darnell Level for Friday 11/08/20 I advised pt that if CP/tightness persists or worsens go to ED  Please advise on pred rx pending his appt or if you have any other recs, thanks!  Allergies  Allergen Reactions   Acetaminophen Shortness Of Breath   Codeine Itching and Nausea And Vomiting   Opana [Oxymorphone] Swelling

## 2020-11-08 ENCOUNTER — Other Ambulatory Visit: Payer: Self-pay

## 2020-11-08 ENCOUNTER — Ambulatory Visit: Payer: PPO | Admitting: Pulmonary Disease

## 2020-11-08 ENCOUNTER — Encounter: Payer: Self-pay | Admitting: Pulmonary Disease

## 2020-11-08 VITALS — BP 122/68 | HR 76 | Temp 97.6°F | Ht 69.5 in | Wt 176.4 lb

## 2020-11-08 DIAGNOSIS — F17210 Nicotine dependence, cigarettes, uncomplicated: Secondary | ICD-10-CM

## 2020-11-08 DIAGNOSIS — J84116 Cryptogenic organizing pneumonia: Secondary | ICD-10-CM | POA: Diagnosis not present

## 2020-11-08 MED ORDER — AZITHROMYCIN 250 MG PO TABS: ORAL_TABLET | ORAL | 2 refills | Status: AC

## 2020-11-08 MED ORDER — PREDNISONE 10 MG PO TABS: 10.0000 mg | ORAL_TABLET | Freq: Every day | ORAL | 2 refills | Status: DC

## 2020-11-08 NOTE — Patient Instructions (Signed)
We are going to get a CT scan of the chest  We are going to get some breathing tests  After you finish your current prednisone you will switch to the new prescription which is 10 mg and take 1 tablet daily  After you finish your current Z-Pak you will start Azithromycin 1 tablet Monday Wednesday and Friday starting on Monday, 11 July  We will see him in follow-up in 4 to 6 weeks time call sooner should any new problems arise you may see me or the nurse practitioner

## 2020-11-08 NOTE — Progress Notes (Signed)
Subjective:    Patient ID: Joshua Guerrero, male    DOB: 02-23-1956, 65 y.o.   MRN: [id number] Chief Complaint  Patient presents with   Follow-up    Prescribed zpak and prednisone on 11/05/20--c/o sob with exertion    HPI Patient is a 65 year old current smoker (half PPD) who presents for follow-up of right lung "mass" and abnormal chest CT. patient had bronchoscopy performed on 29 July 2020 which showed only inflammatory debris, lymph node showed adequate sampling without malignancy.  He subsequently had percutaneous lung biopsy on 07 August 2020 which showed necroinflammatory material.  Given the radiographic picture and biopsy findings this is likely an organizing pneumonia in particular, cryptogenic organizing pneumonia.  The patient has done well with prednisone however he ran out of the prednisone which he was supposed to stay on until seen again uncertain why he did not refill his prednisone.  He then called on the 5 July with increasing symptoms of shortness of breath, cough productive of yellowish sputum and chest pain as prior to his bronchoscopy.  He was started on a prednisone taper pack and azithromycin taper pack.  He notes that he has been feeling better since he started this.  He has not had any fevers, chills or sweats since started back on the prednisone and azithromycin.  He does not endorse any other symptomatology.   Review of Systems A 10 point review of systems was performed and it is as noted above otherwise negative.  Patient Active Problem List   Diagnosis Date Noted   Mild episode of recurrent major depressive disorder ([place]) 06/05/2020   Interstitial pneumonia ([place]) 06/05/2020   Chronic nausea    Acute peptic ulcer of stomach    Stricture and stenosis of esophagus    Hypokalemia 19/50/9326   Salicylate overdose 71/24/5809   Hypogammaglobulinemia ([place]) 12/19/2015   Benign prostatic hyperplasia 11/11/2015   Personal history of colonic polyps    Disease of colon     Benign neoplasm of descending colon    Benign neoplasm of sigmoid colon    Anxiety 09/22/2014   Cervical nerve root disorder 09/22/2014   Back pain, chronic 09/22/2014   Chronic neck and back pain 09/22/2014   Clinical depression 09/22/2014   Acid reflux 09/22/2014   HLD (hyperlipidemia) 09/22/2014   BP (high blood pressure) 09/22/2014   Iron deficiency 09/22/2014   Apnea, sleep 98/33/8250   Umbilical hernia without obstruction and without gangrene 09/22/2014   Social History   Tobacco Use   Smoking status: Every Day    Packs/day: 1.25    Years: 30.00    Pack years: 37.50    Types: Cigarettes   Smokeless tobacco: Never   Tobacco comments:    currently about 1/2 PPD--11/08/2020  Substance Use Topics   Alcohol use: No   Allergies  Allergen Reactions   Acetaminophen Shortness Of Breath   Codeine Itching and Nausea And Vomiting   Opana [Oxymorphone] Swelling    Current Meds  Medication Sig   azithromycin (ZITHROMAX) 250 MG tablet Take 2 tablets (500 mg) on  Day 1,  followed by 1 tablet (250 mg) once daily on Days 2 through 5.   baclofen (LIORESAL) 10 MG tablet Take 10 mg by mouth 4 (four) times daily. STARTING AT LUNCHTIME   desvenlafaxine (PRISTIQ) 100 MG 24 hr tablet TAKE 1 TABLET BY MOUTH DAILY   fentaNYL (DURAGESIC - DOSED MCG/HR) 25 MCG/HR patch Place 1 patch onto the skin every 3 (three) days.  hydrochlorothiazide (HYDRODIURIL) 25 MG tablet TAKE 1 TABLET BY MOUTH DAILY   hydrOXYzine (ATARAX/VISTARIL) 50 MG tablet TAKE 1 TABLET BY MOUTH 3 TIMES DAILY AS NEEDED (USE AS NEEDED FOR SLEEP)   lansoprazole (PREVACID) 30 MG capsule Take 1 capsule (30 mg total) by mouth 2 (two) times daily before a meal.   metoprolol succinate (TOPROL-XL) 50 MG 24 hr tablet TAKE 1 TABLET BY MOUTH DAILY WITH OR IMMEDIATELY FOLLOWING A MEAL.   oxyCODONE-acetaminophen (PERCOCET) 10-325 MG tablet Take 1 tablet by mouth every 4 (four) hours. 3AM, 7AM, 11AM, 3 PM, 7PM AND 11PM   predniSONE  (DELTASONE) 20 MG tablet Take 1 tablet (20 mg total) by mouth daily with breakfast.   prochlorperazine (COMPAZINE) 10 MG tablet Take 1 tablet (10 mg total) by mouth every 8 (eight) hours as needed for nausea or vomiting.   promethazine (PHENERGAN) 25 MG tablet TAKE 1 TABLET BY MOUTH EVERY 8 HOURS AS NEEDED FOR NAUSEA OR VOMITING (Patient taking differently: Take 25 mg by mouth every 8 (eight) hours as needed for vomiting or nausea.)   sucralfate (CARAFATE) 1 g tablet TAKE 1 TABLET BY MOUTH 4 TIMES A DAY WITH MEALS AND AT BEDTIME (Patient taking differently: Take 1 g by mouth 2 (two) times daily.)   topiramate (TOPAMAX) 50 MG tablet Take 50 mg by mouth 2 (two) times daily.   [DISCONTINUED] Iron-Vitamin C (VITRON-C) 65-125 MG TABS Take 1 tablet by mouth daily.   Immunization History  Administered Date(s) Administered   Influenza,inj,Quad PF,6+ Mos 02/20/2015, 05/26/2016, 12/23/2016, 03/07/2018, 03/22/2019, 04/25/2020   PFIZER(Purple Top)SARS-COV-2 Vaccination 08/31/2019, 09/28/2019, 03/12/2020   Pneumococcal Polysaccharide-23 05/26/2016   Td 08/17/2018   Tdap 07/31/2008      Objective:   Physical Exam BP 122/68 (BP Location: Left Arm, Cuff Size: Normal)   Pulse 76   Temp 97.6 F (36.4 C) (Temporal)   Ht 5' 9.5" (1.765 m)   Wt 176 lb 6.4 oz (80 kg)   SpO2 96%   BMI 25.68 kg/m  GENERAL: Well-developed, well-nourished gentleman, in no acute distress.  Anxious. HEAD: Normocephalic, atraumatic. EYES: Pupils equal, round, reactive to light.  No scleral icterus. MOUTH:  Nose/mouth/throat not examined due to masking requirements for COVID 19. NECK: Supple. No thyromegaly. Trachea midline. No JVD.  No adenopathy. PULMONARY: Good air entry bilaterally.    Coarse, otherwise no adventitious sounds. CARDIOVASCULAR: S1 and S2. Regular rate and rhythm.  No rubs murmurs or gallops heard. ABDOMEN: Benign. MUSCULOSKELETAL: No joint deformity, no clubbing, no edema. NEUROLOGIC: No focal deficit, no  gait disturbance, speech is fluent. SKIN: Intact,warm,dry.  On limited exam no rashes. PSYCH: Anxious mood.     Assessment & Plan:     ICD-10-CM   1. Cryptogenic organizing pneumonia [facility])  J84.116 CT CHEST WO CONTRAST    Pulmonary Function Test [facility] Only   Likely relapse off of steroids Resume prednisone 10 mg daily Azithromycin 250 Monday Wednesday Friday Azithromycin as an anti-inflammatory Repeat CT chest    2. Tobacco dependence due to cigarettes  F17.210    Patient was counseled regards to discontinuation of smoking Total counseling time 3 to 5 minutes     Orders Placed This Encounter  Procedures   CT CHEST WO CONTRAST    Next available.    Standing Status:   Future    Standing Expiration Date:   11/08/2021    Order Specific Question:   Preferred imaging location?    Answer:   [facility]   Pulmonary Function Test  [place] Only    Standing Status:   Future    Standing Expiration Date:   11/08/2021    Scheduling Instructions:     4 weeks   Meds ordered this encounter  Medications   predniSONE (DELTASONE) 10 MG tablet    Sig: Take 1 tablet (10 mg total) by mouth daily with breakfast.    Dispense:  30 tablet    Refill:  2   azithromycin (ZITHROMAX Z-PAK) 250 MG tablet    Sig: One tablet M W F starting on 7/11    Dispense:  12 each    Refill:  2   Discussion:  Patient has cryptogenic organizing pneumonia (COP) noted relapse of shortness of breath and cough off of prednisone.  Has resumed prednisone in the form of a taper pack and Azithromycin with resolution of symptoms.  Recommend continuing prednisone at 10 mg daily and use Azithromycin Monday Wednesday Friday as relative steroid sparing agent.  We will repeat CT scan of the chest as well as PFTs to reevaluate the disease.  He may require steroids for a period of a year.  Been advised to discontinue smoking as this will retard any potential improvement on his lung function.  We will see the patient in follow-up in 4  to 6 weeks time he is to contact us prior to that time should any new difficulties arise.  Renold Don, MD [facility]   *This note was dictated using voice recognition software/Dragon.  Despite best efforts to proofread, errors can occur which can change the meaning.  Any change was purely unintentional.

## 2020-11-11 ENCOUNTER — Other Ambulatory Visit: Payer: Self-pay | Admitting: Family Medicine

## 2020-11-11 DIAGNOSIS — M542 Cervicalgia: Secondary | ICD-10-CM

## 2020-11-11 DIAGNOSIS — G8929 Other chronic pain: Secondary | ICD-10-CM

## 2020-11-12 ENCOUNTER — Other Ambulatory Visit: Payer: Self-pay

## 2020-11-12 ENCOUNTER — Ambulatory Visit
Admission: RE | Admit: 2020-11-12 | Discharge: 2020-11-12 | Disposition: A | Discharge status: Home / Self Care | Payer: PPO | Source: Ambulatory Visit | Attending: Pulmonary Disease | Admitting: Pulmonary Disease

## 2020-11-12 DIAGNOSIS — J189 Pneumonia, unspecified organism: Secondary | ICD-10-CM | POA: Diagnosis not present

## 2020-11-12 DIAGNOSIS — J84116 Cryptogenic organizing pneumonia: Secondary | ICD-10-CM

## 2020-11-12 DIAGNOSIS — J439 Emphysema, unspecified: Secondary | ICD-10-CM | POA: Diagnosis not present

## 2020-11-12 DIAGNOSIS — I700 Atherosclerosis of aorta: Secondary | ICD-10-CM | POA: Diagnosis not present

## 2020-11-18 DIAGNOSIS — Z79891 Long term (current) use of opiate analgesic: Secondary | ICD-10-CM | POA: Diagnosis not present

## 2020-11-18 DIAGNOSIS — M961 Postlaminectomy syndrome, not elsewhere classified: Secondary | ICD-10-CM | POA: Diagnosis not present

## 2020-11-18 DIAGNOSIS — M47812 Spondylosis without myelopathy or radiculopathy, cervical region: Secondary | ICD-10-CM | POA: Diagnosis not present

## 2020-11-18 DIAGNOSIS — G894 Chronic pain syndrome: Secondary | ICD-10-CM | POA: Diagnosis not present

## 2020-11-18 DIAGNOSIS — M47817 Spondylosis without myelopathy or radiculopathy, lumbosacral region: Secondary | ICD-10-CM | POA: Diagnosis not present

## 2020-11-22 ENCOUNTER — Other Ambulatory Visit: Payer: PPO

## 2020-11-25 ENCOUNTER — Inpatient Hospital Stay: Payer: PPO | Attending: Oncology

## 2020-11-25 ENCOUNTER — Other Ambulatory Visit: Payer: Self-pay

## 2020-11-25 ENCOUNTER — Ambulatory Visit: Payer: PPO

## 2020-11-25 DIAGNOSIS — Z79899 Other long term (current) drug therapy: Secondary | ICD-10-CM | POA: Diagnosis not present

## 2020-11-25 DIAGNOSIS — F17210 Nicotine dependence, cigarettes, uncomplicated: Secondary | ICD-10-CM | POA: Insufficient documentation

## 2020-11-25 DIAGNOSIS — G4730 Sleep apnea, unspecified: Secondary | ICD-10-CM | POA: Diagnosis not present

## 2020-11-25 DIAGNOSIS — I10 Essential (primary) hypertension: Secondary | ICD-10-CM | POA: Diagnosis not present

## 2020-11-25 DIAGNOSIS — R918 Other nonspecific abnormal finding of lung field: Secondary | ICD-10-CM | POA: Insufficient documentation

## 2020-11-25 DIAGNOSIS — D509 Iron deficiency anemia, unspecified: Secondary | ICD-10-CM | POA: Diagnosis not present

## 2020-11-25 DIAGNOSIS — Z7952 Long term (current) use of systemic steroids: Secondary | ICD-10-CM | POA: Diagnosis not present

## 2020-11-25 LAB — CBC WITH DIFFERENTIAL/PLATELET
Abs Immature Granulocytes: 0.06 10*3/uL (ref 0.00–0.07)
Basophils Absolute: 0.1 10*3/uL (ref 0.0–0.1)
Basophils Relative: 0 %
Eosinophils Absolute: 0.1 10*3/uL (ref 0.0–0.5)
Eosinophils Relative: 1 %
HCT: 41.3 % (ref 39.0–52.0)
Hemoglobin: 13.1 g/dL (ref 13.0–17.0)
Immature Granulocytes: 0 %
Lymphocytes Relative: 6 %
Lymphs Abs: 0.9 10*3/uL (ref 0.7–4.0)
MCH: 26.6 pg (ref 26.0–34.0)
MCHC: 31.7 g/dL (ref 30.0–36.0)
MCV: 83.8 fL (ref 80.0–100.0)
Monocytes Absolute: 0.4 10*3/uL (ref 0.1–1.0)
Monocytes Relative: 3 %
Neutro Abs: 12 10*3/uL — ABNORMAL HIGH (ref 1.7–7.7)
Neutrophils Relative %: 90 %
Platelets: 366 10*3/uL (ref 150–400)
RBC: 4.93 MIL/uL (ref 4.22–5.81)
RDW: 22.3 % — ABNORMAL HIGH (ref 11.5–15.5)
WBC: 13.5 10*3/uL — ABNORMAL HIGH (ref 4.0–10.5)
nRBC: 0 % (ref 0.0–0.2)

## 2020-11-25 LAB — IRON AND TIBC
Iron: 21 ug/dL — ABNORMAL LOW (ref 45–182)
Saturation Ratios: 7 % — ABNORMAL LOW (ref 17.9–39.5)
TIBC: 287 ug/dL (ref 250–450)
UIBC: 266 ug/dL

## 2020-11-25 LAB — FERRITIN: Ferritin: 64 ng/mL (ref 24–336)

## 2020-11-26 ENCOUNTER — Inpatient Hospital Stay: Payer: PPO | Admitting: Oncology

## 2020-11-26 ENCOUNTER — Ambulatory Visit: Payer: PPO | Admitting: Oncology

## 2020-11-26 ENCOUNTER — Other Ambulatory Visit: Payer: PPO

## 2020-11-26 VITALS — BP 146/81 | HR 64 | Temp 98.5°F | Resp 18 | Wt 174.0 lb

## 2020-11-26 DIAGNOSIS — R918 Other nonspecific abnormal finding of lung field: Secondary | ICD-10-CM

## 2020-11-26 DIAGNOSIS — D509 Iron deficiency anemia, unspecified: Secondary | ICD-10-CM | POA: Diagnosis not present

## 2020-11-26 DIAGNOSIS — J849 Interstitial pulmonary disease, unspecified: Secondary | ICD-10-CM

## 2020-11-26 NOTE — Progress Notes (Signed)
Hematology/Oncology Consult note [facility] [phone] [phone]   Patient Care Team: Jerrol Banana., MD as PCP - General (Family Medicine) Jerrol Banana., MD (Family Medicine) Lucilla Lame, MD as Consulting Physician (Gastroenterology) Margaretha Sheffield, MD as Referring Physician (Physical Medicine and Rehabilitation) System, Provider Not In [place], RN as Oncology Nurse Navigator  REFERRING PROVIDER: Jerrol Banana.,*  CHIEF COMPLAINTS/REASON FOR VISIT:  Evaluation of lung mass and spleen mass  HISTORY OF PRESENTING ILLNESS:   Joshua Guerrero is a  65 y.o.  male with PMH listed below was seen in consultation at the request of  Jerrol Banana.,*  for evaluation of lung mass in the spleen mass.  Patient developed a cough and a chest pain in December 2021. 04/25/2020, chest x-ray showed obscuration in the right middle lobe which has some rounded features. Patient was treated by primary care provider for right middle lobe pneumonia with doxycycline. A follow-up CT scan was obtained on 06/16/2020 Irregular masslike focus of consolidation centered in the anteromedial basilar right upper lobe measuring 5.2 x 3.0 with some contiguous involvement on the other side of the major fissure in the right middle lobe. Mild right hilar lymphadenopathy.  Nonspecific. Indeterminate chronic lobulated solid partially calcified 7.4 cm left upper quadrant mass at the anterior inferior margin of the spleen.  Mildly increased in size comparing to 7 cm on 2020 CT and a 5.5 cm on remote 2009 CT.  Two-vessel coronary atherosclerosis.  Aortic atherosclerosis and emphysema.  Current everyday smoker. Patient reports that he has had weight loss due to pneumonia.  Recently has gained 3 pounds back. Appetite is fair.  No night sweating, fever, chills.  INTERVAL HISTORY Joshua Guerrero is a 65 y.o. male who has above history who presents  to clinic today for follow-up of lung and spleen mass and iron deficiency anemia.  He recently had repeat CT scan.  He is here to review the scan and lab work.  Reports he has been taking oral iron supplements twice daily since his last visit.  Has chronic constipation but does not feel it is worse since taking supplements.  Denies any abdominal discomfort.  Reports continuing prednisone 10 mg daily.  He has been on it for approximately 3 to 4 months.  Reports increased agitation and disorganized thinking secondary to steroids.  He was told by Dr. Patsey Berthold he may be on steroids for a year.  He does not believe he can tolerate it for that long.  Review of Systems  Constitutional:  Positive for fatigue.  Respiratory:  Positive for chest tightness, cough and shortness of breath.   Psychiatric/Behavioral:  Positive for decreased concentration and sleep disturbance. The patient is nervous/anxious.    MEDICAL HISTORY:  Past Medical History:  Diagnosis Date   Arthritis    lower back   Brain bleed ([place]) 10/2016   AT [facility]-TREATED MEDICALLY NO SURGERY   Confusion caused by a drug    fentanyl and oxycodone   GERD (gastroesophageal reflux disease)    Headache    past hx, secondary to nerve damage from accident   Hypertension    CONTROLLED ON MEDS   Neck pain, chronic    Neuromuscular disorder (HCC)    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   Sleep apnea    Not currently using a C-Pap    SURGICAL HISTORY: Past Surgical History:  Procedure Laterality Date   COLONOSCOPY WITH PROPOFOL  N/A 02/11/2015   Procedure: COLONOSCOPY WITH PROPOFOL;  Surgeon: Lucilla Lame, MD;  Location: [place];  Service: Endoscopy;  Laterality: N/A;  USES C-PAP   ESOPHAGEAL DILATION N/A 10/14/2018   Procedure: ESOPHAGEAL DILATION;  Surgeon: Lucilla Lame, MD;  Location: [place];  Service: Endoscopy;  Laterality: N/A;   ESOPHAGOGASTRODUODENOSCOPY (EGD) WITH PROPOFOL N/A 10/14/2018    Procedure: ESOPHAGOGASTRODUODENOSCOPY (EGD) WITH BIOPSY;  Surgeon: Lucilla Lame, MD;  Location: [place];  Service: Endoscopy;  Laterality: N/A;  sleep apnea   NASAL SINUS SURGERY     due to severe sleep apnea   NECK SURGERY     diffuse disc-and put in possibly screws patient thinks   SHOULDER SURGERY Bilateral    x 2   UMBILICAL HERNIA REPAIR N/A 02/26/2017   Primary repair.  Surgeon: Robert Bellow, MD;  Location: [facility];  Service: General;  Laterality: N/A;   VIDEO BRONCHOSCOPY WITH ENDOBRONCHIAL NAVIGATION N/A 07/29/2020   Procedure: VIDEO BRONCHOSCOPY WITH ENDOBRONCHIAL NAVIGATION;  Surgeon: Tyler Pita, MD;  Location: [facility];  Service: Pulmonary;  Laterality: N/A;   VIDEO BRONCHOSCOPY WITH ENDOBRONCHIAL ULTRASOUND N/A 07/29/2020   Procedure: VIDEO BRONCHOSCOPY WITH ENDOBRONCHIAL ULTRASOUND;  Surgeon: Tyler Pita, MD;  Location: [facility];  Service: Pulmonary;  Laterality: N/A;    SOCIAL HISTORY: Social History   Socioeconomic History   Marital status: Divorced    Spouse name: single   Number of children: 0   Years of education: 12   Highest education level: High school graduate  Occupational History   Occupation: disability  Tobacco Use   Smoking status: Every Day    Packs/day: 1.25    Years: 30.00    Pack years: 37.50    Types: Cigarettes   Smokeless tobacco: Never   Tobacco comments:    currently about 1/2 PPD--11/08/2020  Vaping Use   Vaping Use: Never used  Substance and Sexual Activity   Alcohol use: No   Drug use: No   Sexual activity: Never  Other Topics Concern   Not on file  Social History Narrative   Not on file   Social Determinants of Health   Financial Resource Strain: Low Risk    Difficulty of Paying Living Expenses: Not hard at all  Food Insecurity: No Food Insecurity   Worried About Charity fundraiser in the Last Year: Never true   Ran Out of Food in the Last Year: Never true  Transportation Needs: No  Transportation Needs   Lack of Transportation (Medical): No   Lack of Transportation (Non-Medical): No  Physical Activity: Inactive   Days of Exercise per Week: 0 days   Minutes of Exercise per Session: 0 min  Stress: No Stress Concern Present   Feeling of Stress : Only a little  Social Connections: Moderately Isolated   Frequency of Communication with Friends and Family: More than three times a week   Frequency of Social Gatherings with Friends and Family: More than three times a week   Attends Religious Services: More than 4 times per year   Active Member of [employer] or Organizations: No   Attends Archivist Meetings: Never   Marital Status: Divorced  Human resources officer Violence: Not At Risk   Fear of Current or Ex-Partner: No   Emotionally Abused: No   Physically Abused: No   Sexually Abused: No    FAMILY HISTORY: Family History  Problem Relation Age of Onset   Dementia Father    Melanoma Mother  Cancer Paternal Uncle        unknown   Cancer Paternal Uncle        unknown    ALLERGIES:  is allergic to acetaminophen, codeine, and opana [oxymorphone].  MEDICATIONS:  Current Outpatient Medications  Medication Sig Dispense Refill   baclofen (LIORESAL) 10 MG tablet Take 10 mg by mouth 4 (four) times daily. STARTING AT LUNCHTIME     desvenlafaxine (PRISTIQ) 100 MG 24 hr tablet TAKE 1 TABLET BY MOUTH DAILY 90 tablet 1   fentaNYL (DURAGESIC - DOSED MCG/HR) 25 MCG/HR patch Place 1 patch onto the skin every 3 (three) days.     hydrochlorothiazide (HYDRODIURIL) 25 MG tablet TAKE 1 TABLET BY MOUTH DAILY 90 tablet 0   hydrOXYzine (ATARAX/VISTARIL) 50 MG tablet TAKE 1 TABLET BY MOUTH 3 TIMES DAILY AS NEEDED (USE AS NEEDED FOR SLEEP) 90 tablet 1   lansoprazole (PREVACID) 30 MG capsule Take 1 capsule (30 mg total) by mouth 2 (two) times daily before a meal. 180 capsule 3   metoprolol succinate (TOPROL-XL) 50 MG 24 hr tablet TAKE 1 TABLET BY MOUTH DAILY WITH OR IMMEDIATELY  FOLLOWING A MEAL. 90 tablet 0   oxyCODONE-acetaminophen (PERCOCET) 10-325 MG tablet Take 1 tablet by mouth every 4 (four) hours. 3AM, 7AM, 11AM, 3 PM, 7PM AND 11PM     predniSONE (DELTASONE) 10 MG tablet Take 1 tablet (10 mg total) by mouth daily with breakfast. 30 tablet 2   predniSONE (DELTASONE) 20 MG tablet Take 1 tablet (20 mg total) by mouth daily with breakfast. 7 tablet 0   prochlorperazine (COMPAZINE) 10 MG tablet TAKE 1 TABLET BY MOUTH EVERY 8 HOURS AS NEEDED FOR NAUSEA OR VOMITING 90 tablet 2   promethazine (PHENERGAN) 25 MG tablet TAKE 1 TABLET BY MOUTH EVERY 8 HOURS AS NEEDED FOR NAUSEA OR VOMITING (Patient taking differently: Take 25 mg by mouth every 8 (eight) hours as needed for vomiting or nausea.) 60 tablet 3   sucralfate (CARAFATE) 1 g tablet TAKE 1 TABLET BY MOUTH 4 TIMES A DAY WITH MEALS AND AT BEDTIME (Patient taking differently: Take 1 g by mouth 2 (two) times daily.) 120 tablet 11   topiramate (TOPAMAX) 50 MG tablet Take 50 mg by mouth 2 (two) times daily.     No current facility-administered medications for this visit.     PHYSICAL EXAMINATION: ECOG PERFORMANCE STATUS: 1 - Symptomatic but completely ambulatory There were no vitals filed for this visit.  There were no vitals filed for this visit.   Physical Exam Constitutional:      General: He is not in acute distress. HENT:     Head: Normocephalic and atraumatic.  Eyes:     General: No scleral icterus. Cardiovascular:     Rate and Rhythm: Normal rate and regular rhythm.     Heart sounds: Normal heart sounds.  Pulmonary:     Effort: Pulmonary effort is normal. No respiratory distress.     Breath sounds: No wheezing.     Comments: Decreased breath sounds bilaterally Abdominal:     General: Bowel sounds are normal. There is no distension.     Palpations: Abdomen is soft.  Musculoskeletal:        General: No deformity. Normal range of motion.     Cervical back: Normal range of motion and neck supple.   Skin:    General: Skin is warm and dry.     Findings: No erythema or rash.  Neurological:     Mental Status: He  is alert and oriented to person, place, and time. Mental status is at baseline.     Cranial Nerves: No cranial nerve deficit.     Coordination: Coordination normal.  Psychiatric:        Mood and Affect: Mood normal.    LABORATORY DATA:  I have reviewed the data as listed Lab Results  Component Value Date   WBC 13.5 (H) 11/25/2020   HGB 13.1 11/25/2020   HCT 41.3 11/25/2020   MCV 83.8 11/25/2020   PLT 366 11/25/2020   Recent Labs    06/21/20 1159 07/25/20 1257  NA 134* 131*  K 3.9 3.4*  CL 99 94*  CO2 25 25  GLUCOSE 94 97  BUN 13 12  CREATININE 0.69 0.85  CALCIUM 8.8* 8.9  GFRNONAA >60 >60  PROT 7.7  --   ALBUMIN 3.5  --   AST 9*  --   ALT 7  --   ALKPHOS 56  --   BILITOT 0.3  --     Iron/TIBC/Ferritin/ %Sat    Component Value Date/Time   IRON 21 (L) 11/25/2020 1257   TIBC 287 11/25/2020 1257   FERRITIN 64 11/25/2020 1257   IRONPCTSAT 7 (L) 11/25/2020 1257      RADIOGRAPHIC STUDIES: I have personally reviewed the radiological images as listed and agreed with the findings in the report. CT CHEST WO CONTRAST  Result Date: 11/14/2020 CLINICAL DATA:  Follow-up pneumonia. EXAM: CT CHEST WITHOUT CONTRAST TECHNIQUE: Multidetector CT imaging of the chest was performed following the standard protocol without IV contrast. COMPARISON:  Chest CT 06/14/2020 and PET-CT 07/11/2020 FINDINGS: Cardiovascular: The heart is normal in size. No pericardial effusion. Stable age advanced aortic and coronary artery calcifications. Mediastinum/Nodes: Small scattered mediastinal and hilar lymph nodes are stable. No mass or adenopathy. The esophagus is grossly normal. Lungs/Pleura: Resolution of right upper lobe airspace process anteriorly with some residual post pneumonic scarring changes and some patchy areas of inflammations/edema/atelectasis. No new pulmonary lesions. No  pleural effusions or pleural nodules. The central tracheobronchial tree is unremarkable. Upper Abdomen: Stable large irregular partially calcified splenic mass. No hepatic or adrenal gland lesions. No upper abdominal adenopathy. Stable vascular calcifications. Musculoskeletal: No chest wall mass, supraclavicular or axillary adenopathy. Thyroid gland is unremarkable. The bony thorax is intact. IMPRESSION: 1. Resolution of right upper lobe airspace process anteriorly (consistent with resolved pneumonia) with some residual post pneumonic scarring changes and some patchy areas of residual inflammation/edema/atelectasis. 2. No new pulmonary lesions or acute pulmonary findings. 3. Stable age advanced aortic and coronary artery calcifications. 4. Stable large irregular partially calcified splenic mass. 5. Emphysema and aortic atherosclerosis. Aortic Atherosclerosis (ICD10-I70.0) and Emphysema (ICD10-J43.9). Electronically Signed   By: Marijo Sanes M.D.   On: 11/14/2020 10:12      ASSESSMENT & PLAN:  No diagnosis found.  Lung mass PET scan showed progression of right upper lobe masslike consolidation with concurrent hypermetabolic activity.  Differentials included infectious obstructive pneumonia versus progressive malignancy.  He has had 2 biopsies that were negative for malignancy.  Case was discussed with Dr. Patsey Berthold and thought to be a reactive process.  He presents back today for results of recent CT scan. Repeat CT scan from 11/12/2020 showed resolution of right upper lobe airspace process consistent with resolved pneumonia with some residual post pneumonic scarring changes and some patchy areas of residual inflammation.  No new pulmonary lesions or acute pulmonary findings.  He is still on 10 mg prednisone for inflammation.  Previously discontinued prednisone  but unfortunately chest congestion came back.  Patient states he does not think he can tolerate much longer of prednisone.  Has follow-up scheduled  with Dr. Patsey Berthold on 01/01/2021.  Splenic mass Discovered on imaging.  Showed low-level heterogeneous activity involving the splenic mass.  Favoring benign etiology such as hemangioma or lymphangioma.  Previously this was discussed at tumor board.  No follow-up needed.  Unintentional weight loss Unclear etiology.Weight is up 5 pounds.   Tobacco use Counseled on smoking cessation  Microcytic anemia     This was incidentally discovered with routine work-up.  Previously had colonoscopy on 02/11/2015 which showed erythematous mucosa in the transverse colon, hepatic flexure, ascending colon and cecum.  Ischemic colitis and several polyps.  Initial labs from 08/22/2020 showed iron deficiency anemia.  He was started on ferrous sulfate tablets twice a day.  Labs from 11/25/2020 show iron saturation 7% and ferritin of 64 with a hemoglobin of 13.1 which is significant improvement from previous.  Recommend he continue oral iron at this time.  Disposition- Continue oral iron. I do not believe he needs any additional CT scans at this time.  He does have follow-up with Dr. Patsey Berthold at the end of the month.  Return to clinic in 4 months for repeat lab work (iron, ferritin, CBC) and to see Dr. Tasia Catchings.  I spent 25 minutes dedicated to the care of this patient (face-to-face and non-face-to-face) on the date of the encounter to include what is described in the assessment and plan.  Faythe Casa, NP 11/26/2020 3:55 PM

## 2020-11-27 ENCOUNTER — Ambulatory Visit (INDEPENDENT_AMBULATORY_CARE_PROVIDER_SITE_OTHER): Payer: PPO | Admitting: Family Medicine

## 2020-11-27 ENCOUNTER — Encounter: Payer: Self-pay | Admitting: Family Medicine

## 2020-11-27 ENCOUNTER — Other Ambulatory Visit: Payer: Self-pay

## 2020-11-27 VITALS — BP 141/72 | HR 66 | Resp 18 | Ht 70.0 in | Wt 176.0 lb

## 2020-11-27 DIAGNOSIS — F330 Major depressive disorder, recurrent, mild: Secondary | ICD-10-CM | POA: Diagnosis not present

## 2020-11-27 DIAGNOSIS — I10 Essential (primary) hypertension: Secondary | ICD-10-CM | POA: Diagnosis not present

## 2020-11-27 DIAGNOSIS — G4733 Obstructive sleep apnea (adult) (pediatric): Secondary | ICD-10-CM | POA: Diagnosis not present

## 2020-11-27 DIAGNOSIS — J849 Interstitial pulmonary disease, unspecified: Secondary | ICD-10-CM

## 2020-11-27 DIAGNOSIS — M549 Dorsalgia, unspecified: Secondary | ICD-10-CM | POA: Diagnosis not present

## 2020-11-27 DIAGNOSIS — G8929 Other chronic pain: Secondary | ICD-10-CM | POA: Diagnosis not present

## 2020-11-27 DIAGNOSIS — M5412 Radiculopathy, cervical region: Secondary | ICD-10-CM

## 2020-11-27 DIAGNOSIS — R110 Nausea: Secondary | ICD-10-CM | POA: Diagnosis not present

## 2020-11-27 DIAGNOSIS — Z716 Tobacco abuse counseling: Secondary | ICD-10-CM

## 2020-11-27 DIAGNOSIS — F32A Depression, unspecified: Secondary | ICD-10-CM | POA: Diagnosis not present

## 2020-11-27 DIAGNOSIS — M542 Cervicalgia: Secondary | ICD-10-CM

## 2020-11-27 MED ORDER — NICOTINE 14 MG/24HR TD PT24: 14.0000 mg | MEDICATED_PATCH | Freq: Every day | TRANSDERMAL | 4 refills | Status: DC

## 2020-11-27 NOTE — Progress Notes (Signed)
I,[name],acting as a scribe for [name], MD.,have documented all relevant documentation on the behalf of [name], MD,as directed by  [name], MD while in the presence of [name], MD.   Established patient visit   Patient: Joshua Guerrero   DOB: 15-Jun-1955   65 y.o. Male  MRN: [id number] Visit Date: 11/27/2020  Today's healthcare provider: [name], MD   Chief Complaint  Patient presents with   Depression   Subjective    Depression        This is a chronic problem.  The current episode started 1 to 4 weeks ago.   The onset quality is gradual.   The problem occurs constantly.  Associated symptoms include decreased concentration, insomnia, irritable, restlessness and sad.  Associated symptoms include no fatigue, no helplessness, no hopelessness, no decreased interest, no appetite change, no myalgias, no headaches, no indigestion and no suicidal ideas.     The symptoms are aggravated by medication.    Patient states he has been on prednisone for around 4 months. Patient states he feels like prednisone is causing him to have symptoms of depression. Symptoms of irritability, decreased concentration, insomnia, and restlessness.  Patient continues smoke about a pack a day and would like to try to quit.  He recently has gone through a complete work-up for probable lung cancer which turned out to be negative.  He has been treated for the pneumonia.      Medications: Outpatient Medications Prior to Visit  Medication Sig   baclofen (LIORESAL) 10 MG tablet Take 10 mg by mouth 4 (four) times daily. STARTING AT LUNCHTIME   desvenlafaxine (PRISTIQ) 100 MG 24 hr tablet TAKE 1 TABLET BY MOUTH DAILY   fentaNYL (DURAGESIC - DOSED MCG/HR) 25 MCG/HR patch Place 1 patch onto the skin every 3 (three) days.   hydrochlorothiazide (HYDRODIURIL) 25 MG tablet TAKE 1 TABLET BY MOUTH DAILY   hydrOXYzine (ATARAX/VISTARIL) 50 MG tablet TAKE 1 TABLET  BY MOUTH 3 TIMES DAILY AS NEEDED (USE AS NEEDED FOR SLEEP)   lansoprazole (PREVACID) 30 MG capsule Take 1 capsule (30 mg total) by mouth 2 (two) times daily before a meal.   metoprolol succinate (TOPROL-XL) 50 MG 24 hr tablet TAKE 1 TABLET BY MOUTH DAILY WITH OR IMMEDIATELY FOLLOWING A MEAL.   oxyCODONE-acetaminophen (PERCOCET) 10-325 MG tablet Take 1 tablet by mouth every 4 (four) hours. 3AM, 7AM, 11AM, 3 PM, 7PM AND 11PM   predniSONE (DELTASONE) 10 MG tablet Take 1 tablet (10 mg total) by mouth daily with breakfast.   prochlorperazine (COMPAZINE) 10 MG tablet TAKE 1 TABLET BY MOUTH EVERY 8 HOURS AS NEEDED FOR NAUSEA OR VOMITING   sucralfate (CARAFATE) 1 g tablet TAKE 1 TABLET BY MOUTH 4 TIMES A DAY WITH MEALS AND AT BEDTIME (Patient taking differently: Take 1 g by mouth 2 (two) times daily.)   topiramate (TOPAMAX) 50 MG tablet Take 50 mg by mouth 2 (two) times daily.   [DISCONTINUED] azithromycin (ZITHROMAX) 250 MG tablet Take by mouth 3 (three) times a week. M, W, F (Patient not taking: Reported on 11/27/2020)   No facility-administered medications prior to visit.    Review of Systems  Constitutional:  Negative for appetite change, chills, fatigue and fever.  Respiratory:  Negative for chest tightness, shortness of breath and wheezing.   Cardiovascular:  Negative for chest pain and palpitations.  Gastrointestinal:  Negative for abdominal pain, nausea and vomiting.  Musculoskeletal:  Negative  for myalgias.  Neurological:  Negative for headaches.  Psychiatric/Behavioral:  Positive for decreased concentration and depression. Negative for suicidal ideas. The patient has insomnia.        Objective    BP (!) 141/72 (BP Location: Left Arm, Patient Position: Sitting, Cuff Size: Large)   Pulse 66   Resp 18   Ht '5\' 10"'$  (1.778 m)   Wt 176 lb (79.8 kg)   SpO2 97%   BMI 25.25 kg/m  BP Readings from Last 3 Encounters:  11/27/20 (!) 141/72  11/26/20 (!) 146/81  11/08/20 122/68   Wt Readings  from Last 3 Encounters:  11/27/20 176 lb (79.8 kg)  11/26/20 174 lb (78.9 kg)  11/08/20 176 lb 6.4 oz (80 kg)       Physical Exam Vitals reviewed.  Constitutional:      General: He is irritable.     Appearance: He is well-developed.  HENT:     Head: Normocephalic and atraumatic.     Right Ear: External ear normal.     Left Ear: External ear normal.     Nose: Nose normal.  Eyes:     General: No scleral icterus.    Conjunctiva/sclera: Conjunctivae normal.  Neck:     Thyroid: No thyromegaly.  Cardiovascular:     Rate and Rhythm: Normal rate and regular rhythm.     Heart sounds: Normal heart sounds.  Pulmonary:     Effort: Pulmonary effort is normal.     Breath sounds: Normal breath sounds.  Abdominal:     General: There is no distension.     Palpations: Abdomen is soft.     Tenderness: There is no abdominal tenderness.  Lymphadenopathy:     Cervical: No cervical adenopathy.  Skin:    General: Skin is warm and dry.  Neurological:     General: No focal deficit present.     Mental Status: He is alert and oriented to person, place, and time.  Psychiatric:        Mood and Affect: Mood normal.        Behavior: Behavior normal.        Thought Content: Thought content normal.        Judgment: Judgment normal.    Depression screen [facility] 2/9 11/28/2020 03/12/2020 06/20/2019  Decreased Interest 0 0 0  Down, Depressed, Hopeless 0 0 0  PHQ - 2 Score 0 0 0  Altered sleeping 1 0 0  Tired, decreased energy 0 0 0  Change in appetite 0 0 0  Feeling bad or failure about yourself  0 0 0  Trouble concentrating 2 0 0  Moving slowly or fidgety/restless 0 0 0  Suicidal thoughts 0 0 0  PHQ-9 Score 3 0 0  Difficult doing work/chores Not difficult at all Not difficult at all Not difficult at all  Some recent data might be hidden      No results found for any visits on 11/27/20.  Assessment & Plan     1. Depression, unspecified depression type   2. Encounter for smoking cessation  counseling Patient strongly advised to quit smoking. - nicotine (NICODERM CQ - DOSED IN MG/24 HOURS) 14 mg/24hr patch; Place 1 patch (14 mg total) onto the skin daily.  Dispense: 28 patch; Refill: 4  3. Primary hypertension Controlled on HCTZ and Toprol 50  4. Interstitial pneumonia ([place]) Followed by pulmonary and treated.  He is feeling much better.  5. Obstructive sleep apnea syndrome On CPAP  6. Cervical nerve  root disorder Followed by pain clinic  7. Mild episode of recurrent major depressive disorder ([place]) On long-term venlafaxine  8. Chronic neck and back pain On fentanyl patches every 3 days  9. Chronic nausea Compazine available.  Consider switching to ondansetron.   Return in about 3 months (around 02/27/2021).      I, [name], MD, have reviewed all documentation for this visit. The documentation on 12/13/20 for the exam, diagnosis, procedures, and orders are all accurate and complete.    Ayaat Jansma Cranford Mon, MD  [facility] [phone] (phone) [phone] (fax)  [place]

## 2020-12-02 ENCOUNTER — Other Ambulatory Visit: Payer: Self-pay

## 2020-12-02 DIAGNOSIS — Z716 Tobacco abuse counseling: Secondary | ICD-10-CM

## 2020-12-02 NOTE — Telephone Encounter (Signed)
Copied from [place] [id number]. Topic: General - Other >> Dec 02, 2020 10:07 AM Yvette Rack wrote: Reason for CRM: Pt stated he needs the Rx for nicotine (NICODERM CQ - DOSED IN MG/24 HOURS) 14 mg/24hr patch to be increased to 21 mg patch because it is not working or curving his cravings.

## 2020-12-04 DIAGNOSIS — M5481 Occipital neuralgia: Secondary | ICD-10-CM | POA: Diagnosis not present

## 2020-12-05 ENCOUNTER — Telehealth: Payer: Self-pay

## 2020-12-05 ENCOUNTER — Other Ambulatory Visit: Payer: Self-pay | Admitting: Family Medicine

## 2020-12-05 DIAGNOSIS — Z716 Tobacco abuse counseling: Secondary | ICD-10-CM

## 2020-12-05 DIAGNOSIS — R12 Heartburn: Secondary | ICD-10-CM

## 2020-12-05 NOTE — Telephone Encounter (Signed)
Copied from [place] [id number]. Topic: General - Other >> Dec 05, 2020  9:43 AM Tessa Lerner A wrote: Reason for CRM: Patient has called regarding their nicotine (NICODERM CQ - DOSED IN MG/24 HOURS) 14 mg/24hr patch    The patient would like to be prescribed '21MG'$  patches instead of '14MG'$  patches  The patient would like to discuss this medication change with a member of clinical staff when possible   Please contact further

## 2020-12-05 NOTE — Telephone Encounter (Signed)
Requested medication (s) are due for refill today: Yes  Requested medication (s) are on the active medication list: Yes  Last refill:  10/03/19  Future visit scheduled: Yes  Notes to clinic:  Prescription expired.    Requested Prescriptions  Pending Prescriptions Disp Refills   sucralfate (CARAFATE) 1 g tablet [Pharmacy Med Name: SUCRALFATE 1 GM TAB] 120 tablet 11    Sig: TAKE 1 TABLET BY MOUTH 4 TIMES A DAY WITH MEALS AND AT BEDTIME      Gastroenterology: Antiacids Passed - 12/05/2020 10:02 AM      Passed - Valid encounter within last 12 months    Recent Outpatient Visits           1 week ago Depression, unspecified depression type   [facility] Jerrol Banana., MD   5 months ago Mass in chest   [facility] Jerrol Banana., MD   6 months ago Chest pain, unspecified type   [facility] Jerrol Banana., MD   6 months ago Chest pain, unspecified type   [facility] Jerrol Banana., MD   7 months ago Chest pain, unspecified type   [facility] Jerrol Banana., MD       Future Appointments             In 2 months Jerrol Banana., MD [facility] [place]

## 2020-12-06 NOTE — Telephone Encounter (Signed)
Please advise 

## 2020-12-11 ENCOUNTER — Telehealth: Payer: Self-pay

## 2020-12-11 MED ORDER — NICOTINE 21 MG/24HR TD PT24: 21.0000 mg | MEDICATED_PATCH | Freq: Every day | TRANSDERMAL | 4 refills | Status: DC

## 2020-12-11 NOTE — Telephone Encounter (Signed)
Called and spoke with patient to let him know that Mable Fill was calling to remind him of his upcoming Covid test on 12/16/20. He expressed understanding. Nothing further needed at this time.

## 2020-12-11 NOTE — Telephone Encounter (Signed)
LVM in regards to upcoming Covid test, will try again later.

## 2020-12-11 NOTE — Telephone Encounter (Signed)
Rx sent to pharmacy   

## 2020-12-16 ENCOUNTER — Other Ambulatory Visit
Admission: RE | Admit: 2020-12-16 | Discharge: 2020-12-16 | Disposition: A | Discharge status: Home / Self Care | Payer: PPO | Source: Ambulatory Visit | Attending: Pulmonary Disease | Admitting: Pulmonary Disease

## 2020-12-16 ENCOUNTER — Other Ambulatory Visit: Payer: Self-pay

## 2020-12-16 DIAGNOSIS — Z20822 Contact with and (suspected) exposure to covid-19: Secondary | ICD-10-CM | POA: Diagnosis not present

## 2020-12-16 DIAGNOSIS — Z01812 Encounter for preprocedural laboratory examination: Secondary | ICD-10-CM | POA: Diagnosis not present

## 2020-12-17 ENCOUNTER — Ambulatory Visit: Payer: PPO | Attending: Pulmonary Disease

## 2020-12-17 DIAGNOSIS — M961 Postlaminectomy syndrome, not elsewhere classified: Secondary | ICD-10-CM | POA: Diagnosis not present

## 2020-12-17 DIAGNOSIS — J84116 Cryptogenic organizing pneumonia: Secondary | ICD-10-CM | POA: Insufficient documentation

## 2020-12-17 DIAGNOSIS — F17210 Nicotine dependence, cigarettes, uncomplicated: Secondary | ICD-10-CM | POA: Insufficient documentation

## 2020-12-17 DIAGNOSIS — M47817 Spondylosis without myelopathy or radiculopathy, lumbosacral region: Secondary | ICD-10-CM | POA: Diagnosis not present

## 2020-12-17 DIAGNOSIS — G894 Chronic pain syndrome: Secondary | ICD-10-CM | POA: Diagnosis not present

## 2020-12-17 DIAGNOSIS — M47812 Spondylosis without myelopathy or radiculopathy, cervical region: Secondary | ICD-10-CM | POA: Diagnosis not present

## 2020-12-17 LAB — SARS CORONAVIRUS 2 (TAT 6-24 HRS): SARS Coronavirus 2: NEGATIVE

## 2020-12-17 MED ORDER — ALBUTEROL SULFATE (2.5 MG/3ML) 0.083% IN NEBU
2.5000 mg | INHALATION_SOLUTION | Freq: Once | RESPIRATORY_TRACT | Status: AC
  Administered 2020-12-17: 2.5 mg via RESPIRATORY_TRACT
  Filled 2020-12-17: qty 3

## 2020-12-26 ENCOUNTER — Other Ambulatory Visit: Payer: Self-pay | Admitting: Family Medicine

## 2020-12-26 DIAGNOSIS — I10 Essential (primary) hypertension: Secondary | ICD-10-CM

## 2020-12-31 ENCOUNTER — Other Ambulatory Visit: Payer: Self-pay | Admitting: Family Medicine

## 2020-12-31 DIAGNOSIS — G4733 Obstructive sleep apnea (adult) (pediatric): Secondary | ICD-10-CM

## 2021-01-01 ENCOUNTER — Other Ambulatory Visit: Payer: Self-pay

## 2021-01-01 ENCOUNTER — Ambulatory Visit: Payer: PPO | Admitting: Pulmonary Disease

## 2021-01-01 ENCOUNTER — Encounter: Payer: Self-pay | Admitting: Pulmonary Disease

## 2021-01-01 VITALS — BP 128/80 | HR 63 | Temp 98.3°F | Ht 69.0 in | Wt 181.8 lb

## 2021-01-01 DIAGNOSIS — J449 Chronic obstructive pulmonary disease, unspecified: Secondary | ICD-10-CM

## 2021-01-01 DIAGNOSIS — F17210 Nicotine dependence, cigarettes, uncomplicated: Secondary | ICD-10-CM

## 2021-01-01 DIAGNOSIS — J84116 Cryptogenic organizing pneumonia: Secondary | ICD-10-CM

## 2021-01-01 MED ORDER — TRELEGY ELLIPTA 100-62.5-25 MCG/INH IN AEPB: 100.0000 ug | INHALATION_SPRAY | Freq: Every day | RESPIRATORY_TRACT | 0 refills | Status: DC

## 2021-01-01 MED ORDER — TRELEGY ELLIPTA 100-62.5-25 MCG/INH IN AEPB: 1.0000 | INHALATION_SPRAY | Freq: Every day | RESPIRATORY_TRACT | 11 refills | Status: DC

## 2021-01-01 NOTE — Progress Notes (Signed)
Subjective:    Patient ID: Joshua Guerrero, male    DOB: [birth year], 65 y.o.   MRN: [id number] Chief Complaint  Patient presents with   Follow-up    HPI Patient is a 65 year old current smoker (half PPD) who presents for follow-up of right lung "mass" and abnormal chest CT. patient had bronchoscopy performed on 29 July 2020 which showed only inflammatory debris, lymph node showed adequate sampling without malignancy.  He subsequently had percutaneous lung biopsy on 07 August 2020 which showed necroinflammatory material.  Given the radiographic picture and biopsy findings this is likely an organizing pneumonia in particular, cryptogenic organizing pneumonia.  The patient has done well with prednisone however he ran out of the prednisone which he was supposed to stay on until seen again uncertain why he did not refill his prednisone.  He then called on the 5 July with increasing symptoms of shortness of breath, cough productive of yellowish sputum and chest pain as prior to his bronchoscopy.  He was started on a prednisone taper pack and azithromycin taper pack.  He notes that he has been feeling better since he started this.   He has not had any fevers, chills or sweats since started back on the prednisone and azithromycin.  He is tapering these off.  He does have occasional wheezing and cough productive of yellowish sputum.  He does not endorse any other symptomatology.  PFTs obtained on 16 August showed that the patient has moderate COPD would benefit from maintenance inhaler.  CT scan performed on 12 November 2020 showed complete resolution of the chest process in question.   Review of Systems A 10 point review of systems was performed and it is as noted above otherwise negative.  Patient Active Problem List   Diagnosis Date Noted   Lesion of skin of scalp 05/13/2023   Smokes with greater than 30 pack year history 05/12/2023   Alcohol use disorder, moderate, dependence (HCC) 03/09/2023    Right inguinal hernia 02/16/2023   Bilateral recurrent inguinal hernia without obstruction or gangrene 01/27/2023   Chronic groin pain, right 01/27/2023   Nausea 01/21/2023   Primary hypertension 01/21/2023   Burning sensation 01/21/2023   Chest congestion 11/04/2022   Balance problem 08/19/2022   Hematoma of right hip 07/27/2022   Bilateral leg edema 06/18/2022   Encounter for annual physical exam 02/27/2022   Primary insomnia 02/27/2022   Productive cough 02/27/2022   Mild episode of recurrent major depressive disorder (HCC) 06/05/2020   Interstitial pneumonia (HCC) 06/05/2020   Chronic nausea    Acute peptic ulcer of stomach    Stricture and stenosis of esophagus    Hypokalemia 05/26/2016   Salicylate overdose 05/24/2016   Hypogammaglobulinemia (HCC) 12/19/2015   Benign prostatic hyperplasia 11/11/2015   History of colonic polyps    Disease of colon    Benign neoplasm of descending colon    Benign neoplasm of sigmoid colon    Anxiety 09/22/2014   Cervical nerve root disorder 09/22/2014   Back pain, chronic 09/22/2014   Chronic neck and back pain 09/22/2014   Clinical depression 09/22/2014   Acid reflux 09/22/2014   HLD (hyperlipidemia) 09/22/2014   HTN, goal below 130/80 09/22/2014   Iron deficiency 09/22/2014   Apnea, sleep 09/22/2014   Umbilical hernia without obstruction and without gangrene 09/22/2014   Allergies  Allergen Reactions   Acetaminophen Shortness Of Breath   Codeine Itching and Nausea And Vomiting   Opana [Oxymorphone] Swelling   Current medications reviewed with  the patient.  Immunization History  Administered Date(s) Administered   Influenza,inj,Quad PF,6+ Mos 02/20/2015, 05/26/2016, 12/23/2016, 03/07/2018, 03/22/2019, 04/25/2020   PFIZER(Purple Top)SARS-COV-2 Vaccination 08/31/2019, 09/28/2019, 03/12/2020   Pneumococcal Polysaccharide-23 05/26/2016   Td 08/17/2018   Tdap 07/31/2008, 08/17/2018      Objective:   Physical Exam BP 128/80 (BP  Location: Left Arm, Patient Position: Sitting, Cuff Size: Normal)   Pulse 63   Temp 98.3 F (36.8 C) (Oral)   Ht 5\' 9"  (1.753 m)   Wt 181 lb 12.8 oz (82.5 kg)   SpO2 99%   BMI 26.85 kg/m   SpO2: 99 % O2 Device: None (Room air)  GENERAL: Well-developed, well-nourished gentleman, in no acute distress.  Anxious. HEAD: Normocephalic, atraumatic. EYES: Pupils equal, round, reactive to light.  No scleral icterus. MOUTH:  Nose/mouth/throat not examined due to masking requirements for COVID 19. NECK: Supple. No thyromegaly. Trachea midline. No JVD.  No adenopathy. PULMONARY: Good air entry bilaterally.    Coarse, otherwise no adventitious sounds. CARDIOVASCULAR: S1 and S2. Regular rate and rhythm.  No rubs murmurs or gallops heard. ABDOMEN: Benign. MUSCULOSKELETAL: No joint deformity, no clubbing, no edema. NEUROLOGIC: No focal deficit, no gait disturbance, speech is fluent. SKIN: Intact,warm,dry.  On limited exam no rashes. PSYCH: Mood and behavior normal.      Assessment & Plan:     ICD-10-CM   1. COPD with asthma (HCC)  J44.9    Trial of Trelegy Ellipta    2. Cryptogenic organizing pneumonia (HCC)  J84.116     3. Tobacco dependence due to cigarettes  F17.210      Meds ordered this encounter  Medications   Fluticasone-Umeclidin-Vilant (TRELEGY ELLIPTA) 100-62.5-25 MCG/INH AEPB    Sig: Inhale 1 puff into the lungs daily.    Dispense:  28 each    Refill:  11   Fluticasone-Umeclidin-Vilant (TRELEGY ELLIPTA) 100-62.5-25 MCG/INH AEPB    Sig: Inhale 100 mcg into the lungs daily.    Dispense:  28 each    Refill:  0    Lot Number?:   [id number]    Expiration Date?:   07/01/2022    Quantity:   2   Will see the patient in follow-up in 3 months time he is to contact us prior to that time should any new problems arise.   Gailen Shelter, MD Advanced Bronchoscopy P[facility]    *This note was generated using voice recognition software/Dragon and/or AI  transcription program.  Despite best efforts to proofread, errors can occur which can change the meaning. Any transcriptional errors that result from this process are unintentional and may not be fully corrected at the time of dictation.

## 2021-01-01 NOTE — Patient Instructions (Signed)
We are giving you a trial of an inhaler called Trelegy Ellipta 100/62.5/25, 1 inhalation daily.  Make sure you rinse your mouth well after you use it.  We will send the prescription also to your pharmacy.  Please continue your efforts to quit smoking.   We will see you in follow-up in 3 months time call sooner should any new problems arise.

## 2021-01-15 DIAGNOSIS — M47812 Spondylosis without myelopathy or radiculopathy, cervical region: Secondary | ICD-10-CM | POA: Diagnosis not present

## 2021-01-15 DIAGNOSIS — M961 Postlaminectomy syndrome, not elsewhere classified: Secondary | ICD-10-CM | POA: Diagnosis not present

## 2021-01-15 DIAGNOSIS — M47817 Spondylosis without myelopathy or radiculopathy, lumbosacral region: Secondary | ICD-10-CM | POA: Diagnosis not present

## 2021-01-15 DIAGNOSIS — G894 Chronic pain syndrome: Secondary | ICD-10-CM | POA: Diagnosis not present

## 2021-02-07 ENCOUNTER — Other Ambulatory Visit: Payer: Self-pay | Admitting: Family Medicine

## 2021-02-07 DIAGNOSIS — I10 Essential (primary) hypertension: Secondary | ICD-10-CM

## 2021-02-17 DIAGNOSIS — M47812 Spondylosis without myelopathy or radiculopathy, cervical region: Secondary | ICD-10-CM | POA: Diagnosis not present

## 2021-02-17 DIAGNOSIS — M961 Postlaminectomy syndrome, not elsewhere classified: Secondary | ICD-10-CM | POA: Diagnosis not present

## 2021-02-17 DIAGNOSIS — M47817 Spondylosis without myelopathy or radiculopathy, lumbosacral region: Secondary | ICD-10-CM | POA: Diagnosis not present

## 2021-02-17 DIAGNOSIS — G894 Chronic pain syndrome: Secondary | ICD-10-CM | POA: Diagnosis not present

## 2021-02-18 DIAGNOSIS — M47812 Spondylosis without myelopathy or radiculopathy, cervical region: Secondary | ICD-10-CM | POA: Diagnosis not present

## 2021-02-20 ENCOUNTER — Other Ambulatory Visit: Payer: Self-pay | Admitting: Family Medicine

## 2021-02-20 DIAGNOSIS — G4733 Obstructive sleep apnea (adult) (pediatric): Secondary | ICD-10-CM

## 2021-03-04 ENCOUNTER — Other Ambulatory Visit: Payer: Self-pay

## 2021-03-04 ENCOUNTER — Encounter: Payer: Self-pay | Admitting: Family Medicine

## 2021-03-04 ENCOUNTER — Ambulatory Visit (INDEPENDENT_AMBULATORY_CARE_PROVIDER_SITE_OTHER): Payer: PPO | Admitting: Family Medicine

## 2021-03-04 VITALS — BP 134/79 | HR 63 | Resp 16 | Ht 69.0 in | Wt 184.0 lb

## 2021-03-04 DIAGNOSIS — M549 Dorsalgia, unspecified: Secondary | ICD-10-CM

## 2021-03-04 DIAGNOSIS — F32A Depression, unspecified: Secondary | ICD-10-CM | POA: Diagnosis not present

## 2021-03-04 DIAGNOSIS — Z23 Encounter for immunization: Secondary | ICD-10-CM

## 2021-03-04 DIAGNOSIS — J849 Interstitial pulmonary disease, unspecified: Secondary | ICD-10-CM

## 2021-03-04 DIAGNOSIS — M542 Cervicalgia: Secondary | ICD-10-CM | POA: Diagnosis not present

## 2021-03-04 DIAGNOSIS — R110 Nausea: Secondary | ICD-10-CM

## 2021-03-04 DIAGNOSIS — R161 Splenomegaly, not elsewhere classified: Secondary | ICD-10-CM

## 2021-03-04 DIAGNOSIS — R918 Other nonspecific abnormal finding of lung field: Secondary | ICD-10-CM

## 2021-03-04 DIAGNOSIS — I10 Essential (primary) hypertension: Secondary | ICD-10-CM

## 2021-03-04 DIAGNOSIS — G8929 Other chronic pain: Secondary | ICD-10-CM | POA: Diagnosis not present

## 2021-03-04 MED ORDER — KETOROLAC TROMETHAMINE 60 MG/2ML IM SOLN
60.0000 mg | Freq: Once | INTRAMUSCULAR | Status: AC
  Administered 2021-03-04: 60 mg via INTRAMUSCULAR

## 2021-03-04 MED ORDER — MELOXICAM 15 MG PO TABS: 15.0000 mg | ORAL_TABLET | Freq: Every day | ORAL | 5 refills | Status: DC | PRN

## 2021-03-04 MED ORDER — PROMETHAZINE HCL 25 MG PO TABS: 25.0000 mg | ORAL_TABLET | Freq: Three times a day (TID) | ORAL | 3 refills | Status: DC | PRN

## 2021-03-04 NOTE — Progress Notes (Signed)
I,[name],acting as a scribe for [name], MD.,have documented all relevant documentation on the behalf of [name], MD,as directed by  [name], MD while in the presence of [name], MD.   Established patient visit   Patient: Joshua Guerrero   DOB: [birth year]   65 y.o. Male  MRN: [id number] Visit Date: 03/04/2021  Today's healthcare provider: [name], MD   Chief Complaint  Patient presents with   Follow-up   Hypertension   Depression   Subjective    HPI  Patient comes in today for follow-up.  Everything is stable. He continues to smoke. Quested Toradol shot as this helps his back pain . Hypertension, follow-up  BP Readings from Last 3 Encounters:  03/04/21 134/79  01/01/21 128/80  11/27/20 (!) 141/72   Wt Readings from Last 3 Encounters:  03/04/21 184 lb (83.5 kg)  01/01/21 181 lb 12.8 oz (82.5 kg)  11/27/20 176 lb (79.8 kg)     He was last seen for hypertension 3 months ago.  BP at that visit was 141/72. Management since that visit includes; Controlled on HCTZ and Toprol 50. He reports good compliance with treatment. He is not having side effects. none He is not exercising. He is adherent to low salt diet.   Outside blood pressures are not checking.  He does not smoke.  Use of agents associated with hypertension: none.   --------------------------------------------------------------------------------------------------- Depression, Follow-up  He  was last seen for this 3 months ago. Changes made at last visit include; On long-term venlafaxine.   He reports good compliance with treatment. He is not having side effects. none  He reports good tolerance of treatment. Current symptoms include:  none He feels he is Improved since last visit.  Depression screen [facility] 2/9 03/04/2021 11/28/2020 03/12/2020  Decreased Interest 0 0 0  Down, Depressed, Hopeless 0 0 0  PHQ - 2 Score 0 0 0  Altered sleeping 0 1  0  Tired, decreased energy 0 0 0  Change in appetite 0 0 0  Feeling bad or failure about yourself  0 0 0  Trouble concentrating 0 2 0  Moving slowly or fidgety/restless 0 0 0  Suicidal thoughts 0 0 0  PHQ-9 Score 0 3 0  Difficult doing work/chores Not difficult at all Not difficult at all Not difficult at all  Some recent data might be hidden    ----------------------------------------------------------------------------------------- Follow up for Chronic neck and back pain:  The patient was last seen for this 3 months ago. Changes made at last visit include; On fentanyl patches every 3 days.  He reports good compliance with treatment. He feels that condition is unchanged. He is not having side effects. none  -----------------------------------------------------------------------------------------     Medications: Outpatient Medications Prior to Visit  Medication Sig   baclofen (LIORESAL) 10 MG tablet Take 10 mg by mouth 4 (four) times daily. STARTING AT LUNCHTIME   desvenlafaxine (PRISTIQ) 100 MG 24 hr tablet TAKE 1 TABLET BY MOUTH DAILY   fentaNYL (DURAGESIC - DOSED MCG/HR) 25 MCG/HR patch Place 1 patch onto the skin every 3 (three) days.   Fluticasone-Umeclidin-Vilant (TRELEGY ELLIPTA) 100-62.5-25 MCG/INH AEPB Inhale 1 puff into the lungs daily.   Fluticasone-Umeclidin-Vilant (TRELEGY ELLIPTA) 100-62.5-25 MCG/INH AEPB Inhale 100 mcg into the lungs daily.   hydrochlorothiazide (HYDRODIURIL) 25 MG tablet TAKE 1 TABLET BY MOUTH DAILY   hydrOXYzine (ATARAX/VISTARIL) 50 MG tablet TAKE 1 TABLET BY MOUTH 3 TIMES DAILY AS  NEEDED (USE AS NEEDED FOR SLEEP)   lansoprazole (PREVACID) 30 MG capsule Take 1 capsule (30 mg total) by mouth 2 (two) times daily before a meal.   metoprolol succinate (TOPROL-XL) 50 MG 24 hr tablet TAKE 1 TABLET BY MOUTH DAILY WITH OR IMMEDIATELY FOLLOWING A MEAL.   nicotine (NICODERM CQ - DOSED IN MG/24 HOURS) 21 mg/24hr patch Place 1 patch (21 mg total) onto the  skin daily.   oxyCODONE-acetaminophen (PERCOCET) 10-325 MG tablet Take 1 tablet by mouth every 4 (four) hours. 3AM, 7AM, 11AM, 3 PM, 7PM AND 11PM   predniSONE (DELTASONE) 10 MG tablet Take 1 tablet (10 mg total) by mouth daily with breakfast.   sucralfate (CARAFATE) 1 g tablet TAKE 1 TABLET BY MOUTH 4 TIMES A DAY WITH MEALS AND AT BEDTIME   topiramate (TOPAMAX) 50 MG tablet Take 50 mg by mouth 2 (two) times daily.   [DISCONTINUED] prochlorperazine (COMPAZINE) 10 MG tablet TAKE 1 TABLET BY MOUTH EVERY 8 HOURS AS NEEDED FOR NAUSEA OR VOMITING   No facility-administered medications prior to visit.    Review of Systems  Constitutional:  Negative for appetite change, chills and fever.  Respiratory:  Negative for chest tightness, shortness of breath and wheezing.   Cardiovascular:  Negative for chest pain and palpitations.  Gastrointestinal:  Negative for abdominal pain, nausea and vomiting.      Objective    BP 134/79 (BP Location: Right Arm, Patient Position: Sitting, Cuff Size: Large)   Pulse 63   Resp 16   Ht 5\' 9"  (1.753 m)   Wt 184 lb (83.5 kg)   SpO2 98%   BMI 27.17 kg/m  {Show previous vital signs (optional):23777}  Physical Exam Vitals reviewed.  Constitutional:      Appearance: He is well-developed.  HENT:     Head: Normocephalic and atraumatic.     Right Ear: External ear normal.     Left Ear: External ear normal.     Nose: Nose normal.  Eyes:     General: No scleral icterus.    Conjunctiva/sclera: Conjunctivae normal.  Neck:     Thyroid: No thyromegaly.  Cardiovascular:     Rate and Rhythm: Normal rate and regular rhythm.     Heart sounds: Normal heart sounds.  Pulmonary:     Effort: Pulmonary effort is normal.     Breath sounds: Normal breath sounds.  Abdominal:     General: There is no distension.     Palpations: Abdomen is soft.     Tenderness: There is no abdominal tenderness.  Lymphadenopathy:     Cervical: No cervical adenopathy.  Skin:    General:  Skin is warm and dry.  Neurological:     General: No focal deficit present.     Mental Status: He is alert and oriented to person, place, and time.  Psychiatric:        Mood and Affect: Mood normal.        Behavior: Behavior normal.        Thought Content: Thought content normal.        Judgment: Judgment normal.      No results found for any visits on 03/04/21.  Assessment & Plan     1. Essential hypertension HCTZ and metoprolol.  2. Depression, unspecified depression type On Pristiq and 100 mg daily 3. Chronic neck and back pain Followed by pain clinic - meloxicam (MOBIC) 15 MG tablet; Take 1 tablet (15 mg total) by mouth daily as needed for pain.  Dispense: 30 tablet; Refill: 5 - ketorolac (TORADOL) injection 60 mg  4. Chronic nausea  - promethazine (PHENERGAN) 25 MG tablet; Take 1 tablet (25 mg total) by mouth every 8 (eight) hours as needed for nausea or vomiting.  Dispense: 35 tablet; Refill: 3  5. Need for influenza vaccination  - Flu Vaccine QUAD 38mo+IM (Fluarix, Fluzone & Alfiuria Quad PF)  6. Interstitial pneumonia ([place]) This is a source of the lung mass.  7. Lung mass Biopsies were negative for malignancy twice  8. Splenic mass Thought to be benign.   Return in about 6 months (around 09/01/2021).      I, [name], MD, have reviewed all documentation for this visit. The documentation on 03/09/21 for the exam, diagnosis, procedures, and orders are all accurate and complete.    Caesar Mannella Cranford Mon, MD  [facility] [phone] (phone) [phone] (fax)  [place]

## 2021-03-11 ENCOUNTER — Telehealth: Payer: Self-pay | Admitting: Pulmonary Disease

## 2021-03-11 ENCOUNTER — Telehealth: Payer: Self-pay

## 2021-03-11 MED ORDER — SPIRIVA HANDIHALER 18 MCG IN CAPS: 18.0000 ug | ORAL_CAPSULE | Freq: Every day | RESPIRATORY_TRACT | 2 refills | Status: DC

## 2021-03-11 NOTE — Telephone Encounter (Signed)
Patient has been advised that we can make a copy for him and will leave it at the front desk

## 2021-03-11 NOTE — Telephone Encounter (Signed)
Patient is aware of below message and voiced his understanding.  Spiriva handihaler has been sent to preferred pharmacy.  Nothing further needed.

## 2021-03-11 NOTE — Telephone Encounter (Signed)
Spoke to patient.  Patient stated that Trelegy makes him anxious and shaky. He has been taking trelegy since 12/2020. He has not taken trelegy for one week and sx have subsided.  Advised patient to continue to hold trelegy.  He will contact insurance for a list of cover alternatives.   Dr. Patsey Berthold, please advise. Thanks

## 2021-03-11 NOTE — Telephone Encounter (Signed)
Copied from [place] [id number]. Topic: General - Inquiry >> Mar 11, 2021 12:37 PM Greggory Keen D wrote: Reason for CRM: Pt called saying he tried to go over to [facility] on [address] to day to get a covid booster and when he told them that he had one booster last winter either nov or dec at [facility] they could not see it in the [place] data base for Covid vaccines He is asking if he can get prove of that vaccine from the office  CB#  [phone]

## 2021-03-13 NOTE — Telephone Encounter (Signed)
done

## 2021-03-17 DIAGNOSIS — G894 Chronic pain syndrome: Secondary | ICD-10-CM | POA: Diagnosis not present

## 2021-03-17 DIAGNOSIS — M961 Postlaminectomy syndrome, not elsewhere classified: Secondary | ICD-10-CM | POA: Diagnosis not present

## 2021-03-17 DIAGNOSIS — M47812 Spondylosis without myelopathy or radiculopathy, cervical region: Secondary | ICD-10-CM | POA: Diagnosis not present

## 2021-03-17 DIAGNOSIS — M47817 Spondylosis without myelopathy or radiculopathy, lumbosacral region: Secondary | ICD-10-CM | POA: Diagnosis not present

## 2021-03-25 ENCOUNTER — Other Ambulatory Visit: Payer: Self-pay | Admitting: Family Medicine

## 2021-03-25 DIAGNOSIS — F32A Depression, unspecified: Secondary | ICD-10-CM

## 2021-03-25 NOTE — Telephone Encounter (Signed)
Requested medication (s) are due for refill today: Due 03/28/21  Requested medication (s) are on the active medication list:yes   Last refill: 09/25/20 #90  1 refill  Future visit scheduled yes 09/09/21  Notes to clinic: Failed due to labs, please review. Thank you.  Requested Prescriptions  Pending Prescriptions Disp Refills   desvenlafaxine (PRISTIQ) 100 MG 24 hr tablet [Pharmacy Med Name: DESVENLAFAXINE SUCCINATE ER 100 MG] 90 tablet 1    Sig: TAKE 1 TABLET BY MOUTH DAILY     Psychiatry: Antidepressants - SNRI - desvenlafaxine & venlafaxine Failed - 03/25/2021 12:26 PM      Failed - LDL in normal range and within 360 days    LDL Calculated  Date Value Ref Range Status  08/25/2017 113 (H) 0 - 99 mg/dL Final          Failed - Total Cholesterol in normal range and within 360 days    Cholesterol, Total  Date Value Ref Range Status  08/25/2017 175 100 - 199 mg/dL Final          Failed - Triglycerides in normal range and within 360 days    Triglycerides  Date Value Ref Range Status  08/25/2017 180 (H) 0 - 149 mg/dL Final          Passed - Completed PHQ-2 or PHQ-9 in the last 360 days      Passed - Last BP in normal range    BP Readings from Last 1 Encounters:  03/04/21 134/79          Passed - Valid encounter within last 6 months    Recent Outpatient Visits           3 weeks ago Essential hypertension   [facility] Jerrol Banana., MD   3 months ago Depression, unspecified depression type   [facility] Jerrol Banana., MD   9 months ago Mass in chest   [facility] Jerrol Banana., MD   9 months ago Chest pain, unspecified type   [facility] Jerrol Banana., MD   10 months ago Chest pain, unspecified type   [facility] Jerrol Banana., MD       Future Appointments             In 5 months Jerrol Banana., MD [facility], [place]

## 2021-03-26 ENCOUNTER — Other Ambulatory Visit: Payer: Self-pay | Admitting: Family Medicine

## 2021-03-26 DIAGNOSIS — I10 Essential (primary) hypertension: Secondary | ICD-10-CM

## 2021-03-31 ENCOUNTER — Other Ambulatory Visit: Payer: Self-pay | Admitting: Family Medicine

## 2021-03-31 DIAGNOSIS — G4733 Obstructive sleep apnea (adult) (pediatric): Secondary | ICD-10-CM

## 2021-03-31 NOTE — Telephone Encounter (Signed)
Medication Refill - Medication: hydrOXYzine (ATARAX/VISTARIL) 50 MG tablet; desvenlafaxine (PRISTIQ) 100 MG 24 hr tablet  Has the patient contacted their pharmacy? Yes.   (Agent: If no, request that the patient contact the pharmacy for the refill. If patient does not wish to contact the pharmacy document the reason why and proceed with request.) (Agent: If yes, when and what did the pharmacy advise?)  Preferred Pharmacy (with phone number or street name):  [place], [place] Phone:  [phone]  Fax:  [phone]     Has the patient been seen for an appointment in the last year OR does the patient have an upcoming appointment? Yes.    Agent: Please be advised that RX refills may take up to 3 business days. We ask that you follow-up with your pharmacy.

## 2021-03-31 NOTE — Telephone Encounter (Signed)
Pharmacy called in to get an update on Rx requested several times. Please advise

## 2021-04-01 ENCOUNTER — Other Ambulatory Visit: Payer: Self-pay | Admitting: *Deleted

## 2021-04-01 DIAGNOSIS — R918 Other nonspecific abnormal finding of lung field: Secondary | ICD-10-CM

## 2021-04-01 DIAGNOSIS — D509 Iron deficiency anemia, unspecified: Secondary | ICD-10-CM

## 2021-04-03 ENCOUNTER — Inpatient Hospital Stay: Payer: PPO | Attending: Oncology

## 2021-04-07 ENCOUNTER — Telehealth: Payer: Self-pay | Admitting: Oncology

## 2021-04-07 ENCOUNTER — Inpatient Hospital Stay: Payer: PPO | Attending: Oncology | Admitting: Oncology

## 2021-04-07 NOTE — Telephone Encounter (Signed)
Attempted to call patient to reschedule appt from [place] 04/07/21. Will attempt to reach again.

## 2021-04-14 DIAGNOSIS — M47812 Spondylosis without myelopathy or radiculopathy, cervical region: Secondary | ICD-10-CM | POA: Diagnosis not present

## 2021-04-14 DIAGNOSIS — M47817 Spondylosis without myelopathy or radiculopathy, lumbosacral region: Secondary | ICD-10-CM | POA: Diagnosis not present

## 2021-04-14 DIAGNOSIS — M961 Postlaminectomy syndrome, not elsewhere classified: Secondary | ICD-10-CM | POA: Diagnosis not present

## 2021-04-14 DIAGNOSIS — G894 Chronic pain syndrome: Secondary | ICD-10-CM | POA: Diagnosis not present

## 2021-04-21 ENCOUNTER — Other Ambulatory Visit: Payer: Self-pay | Admitting: Family Medicine

## 2021-04-22 NOTE — Telephone Encounter (Signed)
Requested medication (s) are due for refill today: NO  Requested medication (s) are on the active medication list: NO  Future visit scheduled: 09/09/21  Notes to clinic:  med not on med list, med is non delegated. Please assess.   Requested Prescriptions  Pending Prescriptions Disp Refills   prochlorperazine (COMPAZINE) 10 MG tablet [Pharmacy Med Name: PROCHLORPERAZINE MALEATE 10 MG TAB] 90 tablet     Sig: TAKE 1 TABLET BY MOUTH EVERY 8 HOURS AS NEEDED FOR NAUSEA OR VOMITING     Not Delegated - Gastroenterology: Antiemetics Failed - 04/21/2021  1:15 PM      Failed - This refill cannot be delegated      Passed - Valid encounter within last 6 months    Recent Outpatient Visits           1 month ago Essential hypertension   [facility] Jerrol Banana., MD   4 months ago Depression, unspecified depression type   [facility] Jerrol Banana., MD   10 months ago Mass in chest   [facility] Jerrol Banana., MD   10 months ago Chest pain, unspecified type   [facility] Jerrol Banana., MD   10 months ago Chest pain, unspecified type   [facility] Jerrol Banana., MD       Future Appointments             In 4 months Jerrol Banana., MD [facility], [place]

## 2021-05-09 ENCOUNTER — Other Ambulatory Visit: Payer: Self-pay | Admitting: Family Medicine

## 2021-05-09 DIAGNOSIS — I10 Essential (primary) hypertension: Secondary | ICD-10-CM

## 2021-05-09 NOTE — Telephone Encounter (Signed)
Requested Prescriptions  Pending Prescriptions Disp Refills   hydrochlorothiazide (HYDRODIURIL) 25 MG tablet [Pharmacy Med Name: HYDROCHLOROTHIAZIDE 25 MG TAB] 90 tablet 0    Sig: TAKE 1 TABLET BY MOUTH DAILY     Cardiovascular: Diuretics - Thiazide Failed - 05/09/2021 11:24 AM      Failed - K in normal range and within 360 days    Potassium  Date Value Ref Range Status  07/25/2020 3.4 (L) 3.5 - 5.1 mmol/L Final  12/12/2011 3.0 (L) 3.5 - 5.1 mmol/L Final         Failed - Na in normal range and within 360 days    Sodium  Date Value Ref Range Status  07/25/2020 131 (L) 135 - 145 mmol/L Final  05/16/2018 143 134 - 144 mmol/L Final  12/12/2011 135 (L) 136 - 145 mmol/L Final         Passed - Ca in normal range and within 360 days    Calcium  Date Value Ref Range Status  07/25/2020 8.9 8.9 - 10.3 mg/dL Final   Calcium, Total  Date Value Ref Range Status  12/12/2011 8.8 8.5 - 10.1 mg/dL Final         Passed - Cr in normal range and within 360 days    Creatinine  Date Value Ref Range Status  12/12/2011 1.09 0.60 - 1.30 mg/dL Final   Creatinine, Ser  Date Value Ref Range Status  07/25/2020 0.85 0.61 - 1.24 mg/dL Final         Passed - Last BP in normal range    BP Readings from Last 1 Encounters:  03/04/21 134/79         Passed - Valid encounter within last 6 months    Recent Outpatient Visits          2 months ago Essential hypertension   [facility] Jerrol Banana., MD   5 months ago Depression, unspecified depression type   [facility] Jerrol Banana., MD   10 months ago Mass in chest   [facility] Jerrol Banana., MD   11 months ago Chest pain, unspecified type   [facility] Jerrol Banana., MD   11 months ago Chest pain, unspecified type   [facility] Jerrol Banana., MD      Future Appointments            In 4 months Jerrol Banana., MD  [facility] [place]

## 2021-05-12 DIAGNOSIS — G894 Chronic pain syndrome: Secondary | ICD-10-CM | POA: Diagnosis not present

## 2021-05-12 DIAGNOSIS — M47817 Spondylosis without myelopathy or radiculopathy, lumbosacral region: Secondary | ICD-10-CM | POA: Diagnosis not present

## 2021-05-12 DIAGNOSIS — M961 Postlaminectomy syndrome, not elsewhere classified: Secondary | ICD-10-CM | POA: Diagnosis not present

## 2021-05-12 DIAGNOSIS — M47812 Spondylosis without myelopathy or radiculopathy, cervical region: Secondary | ICD-10-CM | POA: Diagnosis not present

## 2021-05-26 ENCOUNTER — Other Ambulatory Visit: Payer: Self-pay | Admitting: Family Medicine

## 2021-05-26 NOTE — Telephone Encounter (Signed)
LOV:03/04/2021 NOV:09/09/2021 LR:04/22/21

## 2021-05-27 ENCOUNTER — Ambulatory Visit (INDEPENDENT_AMBULATORY_CARE_PROVIDER_SITE_OTHER): Payer: PPO

## 2021-05-27 DIAGNOSIS — Z0000 Encounter for general adult medical examination without abnormal findings: Secondary | ICD-10-CM

## 2021-05-27 NOTE — Patient Instructions (Signed)
Joshua Guerrero , Thank you for taking time to come for your Medicare Wellness Visit. I appreciate your ongoing commitment to your health goals. Please review the following plan we discussed and let me know if I can assist you in the future.   Screening recommendations/referrals: Colonoscopy: 02/11/15 Recommended yearly ophthalmology/optometry visit for glaucoma screening and checkup Recommended yearly dental visit for hygiene and checkup  Vaccinations: Influenza vaccine: 03/04/21 Pneumococcal vaccine: 05/26/16 Tdap vaccine: 08/17/18 Shingles vaccine: n/d   Covid-19: 08/31/19, 09/28/19, 03/12/20  Advanced directives: yes  Conditions/risks identified: none  Next appointment: Follow up in one year for your annual wellness visit 05/28/22 @ 1pm by phone  Preventive Care 40-64 Years, Male Preventive care refers to lifestyle choices and visits with your health care provider that can promote health and wellness. What does preventive care include? A yearly physical exam. This is also called an annual well check. Dental exams once or twice a year. Routine eye exams. Ask your health care provider how often you should have your eyes checked. Personal lifestyle choices, including: Daily care of your teeth and gums. Regular physical activity. Eating a healthy diet. Avoiding tobacco and drug use. Limiting alcohol use. Practicing safe sex. Taking low-dose aspirin every day starting at age 98. What happens during an annual well check? The services and screenings done by your health care provider during your annual well check will depend on your age, overall health, lifestyle risk factors, and family history of disease. Counseling  Your health care provider may ask you questions about your: Alcohol use. Tobacco use. Drug use. Emotional well-being. Home and relationship well-being. Sexual activity. Eating habits. Work and work Statistician. Screening  You may have the following tests or  measurements: Height, weight, and BMI. Blood pressure. Lipid and cholesterol levels. These may be checked every 5 years, or more frequently if you are over 14 years old. Skin check. Lung cancer screening. You may have this screening every year starting at age 52 if you have a 30-pack-year history of smoking and currently smoke or have quit within the past 15 years. Fecal occult blood test (FOBT) of the stool. You may have this test every year starting at age 63. Flexible sigmoidoscopy or colonoscopy. You may have a sigmoidoscopy every 5 years or a colonoscopy every 10 years starting at age 67. Prostate cancer screening. Recommendations will vary depending on your family history and other risks. Hepatitis C blood test. Hepatitis B blood test. Sexually transmitted disease (STD) testing. Diabetes screening. This is done by checking your blood sugar (glucose) after you have not eaten for a while (fasting). You may have this done every 1-3 years. Discuss your test results, treatment options, and if necessary, the need for more tests with your health care provider. Vaccines  Your health care provider may recommend certain vaccines, such as: Influenza vaccine. This is recommended every year. Tetanus, diphtheria, and acellular pertussis (Tdap, Td) vaccine. You may need a Td booster every 10 years. Zoster vaccine. You may need this after age 42. Pneumococcal 13-valent conjugate (PCV13) vaccine. You may need this if you have certain conditions and have not been vaccinated. Pneumococcal polysaccharide (PPSV23) vaccine. You may need one or two doses if you smoke cigarettes or if you have certain conditions. Talk to your health care provider about which screenings and vaccines you need and how often you need them. This information is not intended to replace advice given to you by your health care provider. Make sure you discuss any questions you have  with your health care provider. Document Released:  05/17/2015 Document Revised: 01/08/2016 Document Reviewed: 02/19/2015 Elsevier Interactive Patient Education  2017 [place] Prevention in the Home Falls can cause injuries. They can happen to people of all ages. There are many things you can do to make your home safe and to help prevent falls. What can I do on the outside of my home? Regularly fix the edges of walkways and driveways and fix any cracks. Remove anything that might make you trip as you walk through a door, such as a raised step or threshold. Trim any bushes or trees on the path to your home. Use bright outdoor lighting. Clear any walking paths of anything that might make someone trip, such as rocks or tools. Regularly check to see if handrails are loose or broken. Make sure that both sides of any steps have handrails. Any raised decks and porches should have guardrails on the edges. Have any leaves, snow, or ice cleared regularly. Use sand or salt on walking paths during winter. Clean up any spills in your garage right away. This includes oil or grease spills. What can I do in the bathroom? Use night lights. Install grab bars by the toilet and in the tub and shower. Do not use towel bars as grab bars. Use non-skid mats or decals in the tub or shower. If you need to sit down in the shower, use a plastic, non-slip stool. Keep the floor dry. Clean up any water that spills on the floor as soon as it happens. Remove soap buildup in the tub or shower regularly. Attach bath mats securely with double-sided non-slip rug tape. Do not have throw rugs and other things on the floor that can make you trip. What can I do in the bedroom? Use night lights. Make sure that you have a light by your bed that is easy to reach. Do not use any sheets or blankets that are too big for your bed. They should not hang down onto the floor. Have a firm chair that has side arms. You can use this for support while you get dressed. Do not have  throw rugs and other things on the floor that can make you trip. What can I do in the kitchen? Clean up any spills right away. Avoid walking on wet floors. Keep items that you use a lot in easy-to-reach places. If you need to reach something above you, use a strong step stool that has a grab bar. Keep electrical cords out of the way. Do not use floor polish or wax that makes floors slippery. If you must use wax, use non-skid floor wax. Do not have throw rugs and other things on the floor that can make you trip. What can I do with my stairs? Do not leave any items on the stairs. Make sure that there are handrails on both sides of the stairs and use them. Fix handrails that are broken or loose. Make sure that handrails are as long as the stairways. Check any carpeting to make sure that it is firmly attached to the stairs. Fix any carpet that is loose or worn. Avoid having throw rugs at the top or bottom of the stairs. If you do have throw rugs, attach them to the floor with carpet tape. Make sure that you have a light switch at the top of the stairs and the bottom of the stairs. If you do not have them, ask someone to add them for you. What  else can I do to help prevent falls? Wear shoes that: Do not have high heels. Have rubber bottoms. Are comfortable and fit you well. Are closed at the toe. Do not wear sandals. If you use a stepladder: Make sure that it is fully opened. Do not climb a closed stepladder. Make sure that both sides of the stepladder are locked into place. Ask someone to hold it for you, if possible. Clearly mark and make sure that you can see: Any grab bars or handrails. First and last steps. Where the edge of each step is. Use tools that help you move around (mobility aids) if they are needed. These include: Canes. Walkers. Scooters. Crutches. Turn on the lights when you go into a dark area. Replace any light bulbs as soon as they burn out. Set up your furniture so  you have a clear path. Avoid moving your furniture around. If any of your floors are uneven, fix them. If there are any pets around you, be aware of where they are. Review your medicines with your doctor. Some medicines can make you feel dizzy. This can increase your chance of falling. Ask your doctor what other things that you can do to help prevent falls. This information is not intended to replace advice given to you by your health care provider. Make sure you discuss any questions you have with your health care provider. Document Released: 02/14/2009 Document Revised: 09/26/2015 Document Reviewed: 05/25/2014 Elsevier Interactive Patient Education  2017 [employer].

## 2021-05-27 NOTE — Progress Notes (Signed)
Virtual Visit via Telephone Note  I connected with  Joshua Guerrero on 05/27/21 at  1:00 PM EST by telephone and verified that I am speaking with the correct person using two identifiers.  Location: Patient: home Provider: [facility] Persons participating in the virtual visit: [place]   I discussed the limitations, risks, security and privacy concerns of performing an evaluation and management service by telephone and the availability of in person appointments. The patient expressed understanding and agreed to proceed.  Interactive audio and video telecommunications were attempted between this nurse and patient, however failed, due to patient having technical difficulties OR patient did not have access to video capability.  We continued and completed visit with audio only.  Some vital signs may be absent or patient reported.   Joshua David, LPN  Subjective:   Joshua Guerrero is a 66 y.o. male who presents for Medicare Annual/Subsequent preventive examination.  Review of Systems           Objective:    Today's Vitals   05/27/21 1305  PainSc: 8    There is no height or weight on file to calculate BMI.  Advanced Directives 08/19/2020 07/29/2020 07/25/2020 07/15/2020 06/21/2020 05/21/2020 05/16/2019  Does Patient Have a Medical Advance Directive? Yes Yes Yes Yes Yes Yes Yes  Type of Advance Directive Living will Living will Living will Living will Living will [place];Living will [place];Living will  Does patient want to make changes to medical advance directive? - No - Patient declined No - Guardian declined - - - -  Copy of [place] in Chart? - - - - - Yes - validated most recent copy scanned in chart (See row information) Yes - validated most recent copy scanned in chart (See row information)    Current Medications (verified) Outpatient Encounter Medications as of 05/27/2021  Medication Sig   baclofen  (LIORESAL) 10 MG tablet Take 10 mg by mouth 4 (four) times daily. STARTING AT LUNCHTIME   desvenlafaxine (PRISTIQ) 100 MG 24 hr tablet TAKE 1 TABLET BY MOUTH DAILY   fentaNYL (DURAGESIC - DOSED MCG/HR) 25 MCG/HR patch Place 1 patch onto the skin every 3 (three) days.   hydrochlorothiazide (HYDRODIURIL) 25 MG tablet TAKE 1 TABLET BY MOUTH DAILY   hydrOXYzine (ATARAX/VISTARIL) 50 MG tablet TAKE 1 TABLET BY MOUTH 3 TIMES DAILY AS NEEDED (USE AS NEEDED FOR SLEEP)   lansoprazole (PREVACID) 30 MG capsule Take 1 capsule (30 mg total) by mouth 2 (two) times daily before a meal.   meloxicam (MOBIC) 15 MG tablet Take 1 tablet (15 mg total) by mouth daily as needed for pain.   metoprolol succinate (TOPROL-XL) 50 MG 24 hr tablet TAKE 1 TABLET BY MOUTH DAILY WITH OR IMMEDIATELY FOLLOWING A MEAL.   nicotine (NICODERM CQ - DOSED IN MG/24 HOURS) 21 mg/24hr patch Place 1 patch (21 mg total) onto the skin daily.   oxyCODONE-acetaminophen (PERCOCET) 10-325 MG tablet Take 1 tablet by mouth every 4 (four) hours. 3AM, 7AM, 11AM, 3 PM, 7PM AND 11PM   predniSONE (DELTASONE) 10 MG tablet Take 1 tablet (10 mg total) by mouth daily with breakfast.   prochlorperazine (COMPAZINE) 10 MG tablet TAKE 1 TABLET BY MOUTH EVERY 8 HOURS AS NEEDED FOR NAUSEA OR VOMITING   promethazine (PHENERGAN) 25 MG tablet Take 1 tablet (25 mg total) by mouth every 8 (eight) hours as needed for nausea or vomiting.   sucralfate (CARAFATE) 1 g tablet TAKE  1 TABLET BY MOUTH 4 TIMES A DAY WITH MEALS AND AT BEDTIME   tiotropium (SPIRIVA HANDIHALER) 18 MCG inhalation capsule Place 1 capsule (18 mcg total) into inhaler and inhale daily.   topiramate (TOPAMAX) 50 MG tablet Take 50 mg by mouth 2 (two) times daily.   No facility-administered encounter medications on file as of 05/27/2021.    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   Sleep apnea    Not currently using a C-Pap   Past Surgical History:  Procedure Laterality Date   COLONOSCOPY WITH PROPOFOL N/A 02/11/2015   Procedure: COLONOSCOPY WITH PROPOFOL;  Surgeon: Joshua Lame, MD;  Location: [place];  Service: Endoscopy;  Laterality: N/A;  USES C-PAP   ESOPHAGEAL DILATION N/A 10/14/2018   Procedure: ESOPHAGEAL DILATION;  Surgeon: Joshua Lame, MD;  Location: [place];  Service: Endoscopy;  Laterality: N/A;   ESOPHAGOGASTRODUODENOSCOPY (EGD) WITH PROPOFOL N/A 10/14/2018   Procedure: ESOPHAGOGASTRODUODENOSCOPY (EGD) WITH BIOPSY;  Surgeon: Joshua Lame, MD;  Location: [place];  Service: Endoscopy;  Laterality: N/A;  sleep apnea   NASAL SINUS SURGERY     due to severe sleep apnea   NECK SURGERY     diffuse disc-and put in possibly screws patient thinks   SHOULDER SURGERY Bilateral    x 2   UMBILICAL HERNIA REPAIR N/A 02/26/2017   Primary repair.  Surgeon: Joshua Bellow, MD;  Location: [facility];  Service: General;  Laterality: N/A;   VIDEO BRONCHOSCOPY WITH ENDOBRONCHIAL NAVIGATION N/A 07/29/2020   Procedure: VIDEO BRONCHOSCOPY WITH ENDOBRONCHIAL NAVIGATION;  Surgeon: Joshua Pita, MD;  Location: [facility];  Service: Pulmonary;  Laterality: N/A;   VIDEO BRONCHOSCOPY WITH ENDOBRONCHIAL ULTRASOUND N/A 07/29/2020   Procedure: VIDEO BRONCHOSCOPY WITH ENDOBRONCHIAL ULTRASOUND;  Surgeon: Joshua Pita, MD;  Location: [facility];  Service: Pulmonary;  Laterality: N/A;   Family History  Problem Relation Age of Onset   Dementia Father    Melanoma Mother    Cancer Paternal  Uncle        unknown   Cancer Paternal Uncle        unknown   Social History   Socioeconomic History   Marital status: Divorced    Spouse name: single   Number of children: 0   Years of education: 12   Highest education level: High school graduate  Occupational History   Occupation: disability  Tobacco Use   Smoking status: Every Day    Packs/day: 1.25    Years: 30.00    Pack years: 37.50    Types: Cigarettes   Smokeless tobacco: Never   Tobacco comments:    currently about 1/2 PPD--12/31/2020  Vaping Use   Vaping Use: Never used  Substance and Sexual Activity   Alcohol use: No   Drug use: No   Sexual activity: Never  Other Topics Concern   Not on file  Social History Narrative   Not on file  Social Determinants of Health   Financial Resource Strain: Not on file  Food Insecurity: Not on file  Transportation Needs: Not on file  Physical Activity: Not on file  Stress: Not on file  Social Connections: Not on file    Tobacco Counseling Ready to quit: Not Answered Counseling given: Not Answered Tobacco comments: currently about 1/2 PPD--12/31/2020   Clinical Intake:  Pre-visit preparation completed: Yes  Pain : 0-10 Pain Score: 8  Pain Type: Chronic pain Pain Location: Neck     Nutritional Risks: None Diabetes: No  How often do you need to have someone help you when you read instructions, pamphlets, or other written materials from your doctor or pharmacy?: 1 - Never  Diabetic?no  Interpreter Needed?: No  Information entered by :: Kirke Shaggy, LPN   Activities of Daily Living In your present state of health, do you have any difficulty performing the following activities: 03/04/2021 11/28/2020  Hearing? Tempie Donning  Vision? N N  Difficulty concentrating or making decisions? N Y  Walking or climbing stairs? N N  Dressing or bathing? N N  Doing errands, shopping? N N  Some recent data might be hidden    Patient Care Team: Jerrol Banana., MD  as PCP - General (Family Medicine) Jerrol Banana., MD (Family Medicine) Joshua Lame, MD as Consulting Physician (Gastroenterology) Margaretha Sheffield, MD as Referring Physician (Physical Medicine and Rehabilitation) System, Provider Not In [place], Angie Fava, RN as Oncology Nurse Navigator  Indicate any recent Medical Services you may have received from other than [facility] providers in the past year (date may be approximate).     Assessment:   This is a routine wellness examination for [place].  Hearing/Vision screen No results found.  Dietary issues and exercise activities discussed:     Goals Addressed   None    Depression Screen PHQ 2/9 Scores 03/04/2021 11/28/2020 03/12/2020 06/20/2019 05/16/2019 03/22/2019 09/08/2018  PHQ - 2 Score 0 0 0 0 0 0 3  PHQ- 9 Score 0 3 0 0 - 3 5    Fall Risk Fall Risk  03/04/2021 11/28/2020 05/21/2020 03/12/2020 05/16/2019  Falls in the past year? 0 0 0 0 0  Number falls in past yr: 0 0 0 0 0  Injury with Fall? 0 0 0 0 0  Risk for fall due to : Impaired balance/gait No Fall Risks - - -  Follow up Falls evaluation completed Falls evaluation completed - Falls evaluation completed -    FALL RISK PREVENTION PERTAINING TO THE HOME:  Any stairs in or around the home? No  If so, are there any without handrails? No  Home free of loose throw rugs in walkways, pet beds, electrical cords, etc? Yes  Adequate lighting in your home to reduce risk of falls? Yes   ASSISTIVE DEVICES UTILIZED TO PREVENT FALLS:  Life alert? No  Use of a cane, walker or w/c? No  Grab bars in the bathroom? Yes  Shower chair or bench in shower? Yes  Elevated toilet seat or a handicapped toilet? Yes    Cognitive Function:Normal cognitive status assessed by direct observation by this Nurse Health Advisor. No abnormalities found.          Immunizations Immunization History  Administered Date(s) Administered   Influenza,inj,Quad PF,6+ Mos 02/20/2015, 05/26/2016, 12/23/2016,  03/07/2018, 03/22/2019, 04/25/2020, 03/04/2021   PFIZER(Purple Top)SARS-COV-2 Vaccination 08/31/2019, 09/28/2019, 03/12/2020   Pneumococcal Polysaccharide-23 05/26/2016   Td 08/17/2018   Tdap 07/31/2008    TDAP status: Up  to date  Flu Vaccine status: Up to date  Pneumococcal vaccine status: Up to date  Covid-19 vaccine status: Completed vaccines  Qualifies for Shingles Vaccine? No   Zostavax completed No   Shingrix Completed?: No.    Education has been provided regarding the importance of this vaccine. Patient has been advised to call insurance company to determine out of pocket expense if they have not yet received this vaccine. Advised may also receive vaccine at local pharmacy or Health Dept. Verbalized acceptance and understanding.  Screening Tests Health Maintenance  Topic Date Due   HIV Screening  Never done   Hepatitis C Screening  Never done   Zoster Vaccines- Shingrix (1 of 2) Never done   COVID-19 Vaccine (4 - Booster for Pfizer series) 05/07/2020   COLONOSCOPY (Pts 45-69yrs Insurance coverage will need to be confirmed)  02/10/2025   TETANUS/TDAP  08/16/2028   INFLUENZA VACCINE  Completed   HPV VACCINES  Aged Out    Health Maintenance  Health Maintenance Due  Topic Date Due   HIV Screening  Never done   Hepatitis C Screening  Never done   Zoster Vaccines- Shingrix (1 of 2) Never done   COVID-19 Vaccine (4 - Booster for Pfizer series) 05/07/2020    Colorectal cancer screening: Type of screening: Colonoscopy. Completed 02/11/15. Repeat every 10 years  Lung Cancer Screening: (Low Dose CT Chest recommended if Age 66-80 years, 30 pack-year currently smoking OR have quit w/in 15years.) does qualify.   Lung Cancer Screening Referral: pt declined  Additional Screening:  Hepatitis C Screening: does qualify; Completed no  Vision Screening: Recommended annual ophthalmology exams for early detection of glaucoma and other disorders of the eye. Is the patient up to  date with their annual eye exam?  Yes  Who is the provider or what is the name of the office in which the patient attends annual eye exams? [facility] on [address] If pt is not established with a provider, would they like to be referred to a provider to establish care? No .   Dental Screening: Recommended annual dental exams for proper oral hygiene  Community Resource Referral / Chronic Care Management: CRR required this visit?  No   CCM required this visit?  No      Plan:     I have personally reviewed and noted the following in the patients chart:   Medical and social history Use of alcohol, tobacco or illicit drugs  Current medications and supplements including opioid prescriptions. Patient is currently taking opioid prescriptions. Information provided to patient regarding non-opioid alternatives. Patient advised to discuss non-opioid treatment plan with their provider. Functional ability and status Nutritional status Physical activity Advanced directives List of other physicians Hospitalizations, surgeries, and ER visits in previous 12 months Vitals Screenings to include cognitive, depression, and falls Referrals and appointments  In addition, I have reviewed and discussed with patient certain preventive protocols, quality metrics, and best practice recommendations. A written personalized care plan for preventive services as well as general preventive health recommendations were provided to patient.     Joshua David, LPN   [id number]/7046   Nurse Notes: none

## 2021-06-10 DIAGNOSIS — G894 Chronic pain syndrome: Secondary | ICD-10-CM | POA: Diagnosis not present

## 2021-06-10 DIAGNOSIS — E273 Drug-induced adrenocortical insufficiency: Secondary | ICD-10-CM | POA: Diagnosis not present

## 2021-06-10 DIAGNOSIS — J439 Emphysema, unspecified: Secondary | ICD-10-CM | POA: Diagnosis not present

## 2021-06-10 DIAGNOSIS — F17200 Nicotine dependence, unspecified, uncomplicated: Secondary | ICD-10-CM | POA: Diagnosis not present

## 2021-06-10 DIAGNOSIS — M961 Postlaminectomy syndrome, not elsewhere classified: Secondary | ICD-10-CM | POA: Diagnosis not present

## 2021-06-10 DIAGNOSIS — G709 Myoneural disorder, unspecified: Secondary | ICD-10-CM | POA: Diagnosis not present

## 2021-06-10 DIAGNOSIS — D801 Nonfamilial hypogammaglobulinemia: Secondary | ICD-10-CM | POA: Diagnosis not present

## 2021-06-10 DIAGNOSIS — M47817 Spondylosis without myelopathy or radiculopathy, lumbosacral region: Secondary | ICD-10-CM | POA: Diagnosis not present

## 2021-06-10 DIAGNOSIS — I739 Peripheral vascular disease, unspecified: Secondary | ICD-10-CM | POA: Diagnosis not present

## 2021-06-10 DIAGNOSIS — M47812 Spondylosis without myelopathy or radiculopathy, cervical region: Secondary | ICD-10-CM | POA: Diagnosis not present

## 2021-06-10 DIAGNOSIS — T380X5D Adverse effect of glucocorticoids and synthetic analogues, subsequent encounter: Secondary | ICD-10-CM | POA: Diagnosis not present

## 2021-06-25 ENCOUNTER — Other Ambulatory Visit: Payer: Self-pay | Admitting: Family Medicine

## 2021-06-25 DIAGNOSIS — R110 Nausea: Secondary | ICD-10-CM

## 2021-06-25 NOTE — Telephone Encounter (Signed)
Requested medication (s) are due for refill today: yes  Requested medication (s) are on the active medication list: yes  Last refill:  03/04/21 #35 with 3 RF  Future visit scheduled: 09/09/21  Notes to clinic:  This medication can not be delegated, please assess.        Requested Prescriptions  Pending Prescriptions Disp Refills   promethazine (PHENERGAN) 25 MG tablet [Pharmacy Med Name: PROMETHAZINE HCL 25 MG TAB] 35 tablet 3    Sig: TAKE 1 TABLET BY MOUTH EVERY 8 HOURS AS NEEDED FOR NAUSEA OR VOMITING.     Not Delegated - Gastroenterology: Antiemetics Failed - 06/25/2021 12:04 PM      Failed - This refill cannot be delegated      Passed - Valid encounter within last 6 months    Recent Outpatient Visits           3 months ago Essential hypertension   [facility] Jerrol Banana., MD   7 months ago Depression, unspecified depression type   [facility] Jerrol Banana., MD   1 year ago Mass in chest   [facility] Jerrol Banana., MD   1 year ago Chest pain, unspecified type   [facility] Jerrol Banana., MD   1 year ago Chest pain, unspecified type   [facility] Jerrol Banana., MD       Future Appointments             In 2 months Jerrol Banana., MD [facility] [place]

## 2021-07-02 ENCOUNTER — Other Ambulatory Visit: Payer: Self-pay | Admitting: Family Medicine

## 2021-07-02 NOTE — Telephone Encounter (Signed)
Requested medications are due for refill today.  yes ? ?Requested medications are on the active medications list.  yes ? ?Last refill. 05/26/2021 #90 0 refills ? ?Future visit - yes ? ?Notes to clinic.  Medication not delegated. ? ? ? ?Requested Prescriptions  ?Pending Prescriptions Disp Refills  ? prochlorperazine (COMPAZINE) 10 MG tablet [Pharmacy Med Name: PROCHLORPERAZINE MALEATE 10 MG TAB] 90 tablet 0  ?  Sig: TAKE 1 TABLET BY MOUTH EVERY 8 HOURS AS NEEDED FOR NAUSEA OR VOMITING  ?  ? Not Delegated - Gastroenterology: Antiemetics Failed - 07/02/2021 11:39 AM  ?  ?  Failed - This refill cannot be delegated  ?  ?  Passed - Valid encounter within last 6 months  ?  Recent Outpatient Visits   ? ?      ? 4 months ago Essential hypertension  ? [facility] Jerrol Banana., MD  ? 7 months ago Depression, unspecified depression type  ? [facility] Jerrol Banana., MD  ? 1 year ago Mass in chest  ? [facility] Jerrol Banana., MD  ? 1 year ago Chest pain, unspecified type  ? [facility] Jerrol Banana., MD  ? 1 year ago Chest pain, unspecified type  ? [facility] Jerrol Banana., MD  ? ?  ?  ?Future Appointments   ? ?        ? In 2 months Jerrol Banana., MD [facility]  ? ?  ? ?  ?  ?  ?  ?

## 2021-07-07 ENCOUNTER — Other Ambulatory Visit: Payer: Self-pay | Admitting: Family Medicine

## 2021-07-07 DIAGNOSIS — G894 Chronic pain syndrome: Secondary | ICD-10-CM | POA: Diagnosis not present

## 2021-07-07 DIAGNOSIS — M47817 Spondylosis without myelopathy or radiculopathy, lumbosacral region: Secondary | ICD-10-CM | POA: Diagnosis not present

## 2021-07-07 DIAGNOSIS — M47812 Spondylosis without myelopathy or radiculopathy, cervical region: Secondary | ICD-10-CM | POA: Diagnosis not present

## 2021-07-07 DIAGNOSIS — M961 Postlaminectomy syndrome, not elsewhere classified: Secondary | ICD-10-CM | POA: Diagnosis not present

## 2021-07-10 ENCOUNTER — Ambulatory Visit: Payer: Self-pay | Admitting: *Deleted

## 2021-07-10 NOTE — Telephone Encounter (Signed)
Summary: pain medication making him sick and is losing weight  ? Pt sees Dr. Rosanna Randy and has an appt in May for a CPe but he says the pain medication he is taking now is making him sick to his stomach and he says he is losing weight..   There was an appt for the 23rd but he says he needs something now.   ?  ? ? ? ?Chief Complaint: pain medications causing nausea, constipation, and has lost weight approx. 10 lbs in 2 months  ?Symptoms: nausea, forces self to eat, constipation , reports phenergan not helping ?Frequency: na ?Pertinent Negatives: Patient denies vomiting and poor appetite. ?Disposition: '[]'$ ED /'[]'$ Urgent Care (no appt availability in office) / '[x]'$ Appointment(In office/virtual)/ '[]'$  [place] [facility]/ '[]'$ Home Care/ '[]'$ Refused Recommended Disposition /'[]'$ [place] [facility]/ '[]'$  Follow-up with PCP ?Additional Notes:  ? ?Appt scheduled for 07/24/21 for requests for medication to treat constipation d/t patent can not get OTC "DOK" . Other OTC medication causing diarrhea. Please advise patient requesting if he can try linzess.  Please advise .  ? ? ? ?Reason for Disposition ? Taking prescription medication that could cause nausea (e.g., narcotics/opiates, antibiotics, OCPs, many others) ? ?Answer Assessment - Initial Assessment Questions ?1. NAUSEA SEVERITY: "How bad is the nausea?" (e.g., mild, moderate, severe; dehydration, weight loss) ?  - MILD: loss of appetite without change in eating habits ?  - MODERATE: decreased oral intake without significant weight loss, dehydration, or malnutrition ?  - SEVERE: inadequate caloric or fluid intake, significant weight loss, symptoms of dehydration ?    Mild to moderate, reports losing 10 lbs in 2 months  ?2. ONSET: "When did the nausea begin?" ?    na ?3. VOMITING: "Any vomiting?" If Yes, ask: "How many times today?" ?    no ?4. RECURRENT SYMPTOM: "Have you had nausea before?" If Yes, ask: "When was the last time?" "What happened that time?" ?    Yes  ?5. CAUSE:  "What do you think is causing the nausea?" ?    See hx. CA, taking multiple narcotics ?6. PREGNANCY: "Is there any chance you are pregnant?" (e.g., unprotected intercourse, missed birth control pill, broken condom) ?    na ? ?Protocols used: Nausea-A-AH ? ?

## 2021-07-10 NOTE — Telephone Encounter (Signed)
Pt can see someone other than me next week for this issue--maybe lindsay

## 2021-07-10 NOTE — Telephone Encounter (Signed)
[id number]-ok for [facility] nurse to schedule patient an appointment. ?

## 2021-07-11 ENCOUNTER — Ambulatory Visit: Payer: PPO | Admitting: Nurse Practitioner

## 2021-07-24 ENCOUNTER — Ambulatory Visit (INDEPENDENT_AMBULATORY_CARE_PROVIDER_SITE_OTHER): Payer: PPO | Admitting: Family Medicine

## 2021-07-24 ENCOUNTER — Other Ambulatory Visit: Payer: Self-pay

## 2021-07-24 ENCOUNTER — Encounter: Payer: Self-pay | Admitting: Family Medicine

## 2021-07-24 VITALS — BP 149/81 | HR 79 | Temp 98.2°F | Resp 18 | Wt 181.7 lb

## 2021-07-24 DIAGNOSIS — E7849 Other hyperlipidemia: Secondary | ICD-10-CM | POA: Diagnosis not present

## 2021-07-24 DIAGNOSIS — M542 Cervicalgia: Secondary | ICD-10-CM | POA: Diagnosis not present

## 2021-07-24 DIAGNOSIS — E611 Iron deficiency: Secondary | ICD-10-CM | POA: Diagnosis not present

## 2021-07-24 DIAGNOSIS — R110 Nausea: Secondary | ICD-10-CM

## 2021-07-24 DIAGNOSIS — G8929 Other chronic pain: Secondary | ICD-10-CM

## 2021-07-24 DIAGNOSIS — I10 Essential (primary) hypertension: Secondary | ICD-10-CM

## 2021-07-24 DIAGNOSIS — M549 Dorsalgia, unspecified: Secondary | ICD-10-CM | POA: Diagnosis not present

## 2021-07-24 DIAGNOSIS — E876 Hypokalemia: Secondary | ICD-10-CM | POA: Diagnosis not present

## 2021-07-24 MED ORDER — KETOROLAC TROMETHAMINE 60 MG/2ML IM SOLN
60.0000 mg | Freq: Once | INTRAMUSCULAR | Status: AC
  Administered 2021-07-24: 60 mg via INTRAMUSCULAR

## 2021-07-24 MED ORDER — PROCHLORPERAZINE MALEATE 10 MG PO TABS: 10.0000 mg | ORAL_TABLET | Freq: Four times a day (QID) | ORAL | 0 refills | Status: DC | PRN

## 2021-07-24 NOTE — Patient Instructions (Signed)
TRY GLYCOLAX DAILY AND PERICOLACE. ?

## 2021-07-24 NOTE — Progress Notes (Signed)
?  ? ? ?Established patient visit ? ?I,[name],acting as a scribe for [name], MD.,have documented all relevant documentation on the behalf of [name], MD,as directed by  [name], MD while in the presence of [name], MD. ? ? ?Patient: Joshua Guerrero   DOB: 1956-03-04   66 y.o. Male  MRN: [id number] ?Visit Date: 07/24/2021 ? ?Today's healthcare provider: [name], MD  ? ?Chief Complaint  ?Patient presents with  ? Weight Loss  ?  Unintentional  ? ?Subjective  ?  ?HPI ?Patient comes in today for follow-up of chronic pain and nausea secondary to generic fentanyl. ?He states the [place]  brand of fentanyl does well for him.  But any others causing severe nausea which required medications including Phenergan or gluten or Zofran.  He states the Reglan works better than anything.  He requests going up on the Reglan to 4 times a day.  I told him we could do this as long as we eliminate the Phenergan. ?His weight is remained stable since last year.  He has no GI symptoms other than the nausea that he associates with the fentanyl. ? ?HPI   ? ? Weight Loss   ? Additional comments: Unintentional ? ?  ?  ?Last edited by Julieta Bellini, CMA on 07/24/2021  2:41 PM.  ?  ?  ?Weight Loss, unintentional: ? ?Patient states the pain medication he is taking now is making him sick to his stomach. He is having symptoms of diarrhea, constipation vomiting, and nausea. Patient is losing weight. ? ?Medications: ?Outpatient Medications Prior to Visit  ?Medication Sig  ? baclofen (LIORESAL) 10 MG tablet Take 10 mg by mouth 4 (four) times daily. STARTING AT LUNCHTIME  ? desvenlafaxine (PRISTIQ) 100 MG 24 hr tablet TAKE 1 TABLET BY MOUTH DAILY  ? fentaNYL (DURAGESIC - DOSED MCG/HR) 25 MCG/HR patch Place 1 patch onto the skin every 3 (three) days.  ? hydrochlorothiazide (HYDRODIURIL) 25 MG tablet TAKE 1 TABLET BY MOUTH DAILY  ? hydrOXYzine (ATARAX/VISTARIL) 50 MG tablet TAKE 1 TABLET BY MOUTH  3 TIMES DAILY AS NEEDED (USE AS NEEDED FOR SLEEP)  ? lansoprazole (PREVACID) 30 MG capsule Take 1 capsule (30 mg total) by mouth 2 (two) times daily before a meal.  ? meloxicam (MOBIC) 15 MG tablet Take 1 tablet (15 mg total) by mouth daily as needed for pain.  ? metoprolol succinate (TOPROL-XL) 50 MG 24 hr tablet TAKE 1 TABLET BY MOUTH DAILY WITH OR IMMEDIATELY FOLLOWING A MEAL.  ? oxyCODONE-acetaminophen (PERCOCET) 10-325 MG tablet Take 1 tablet by mouth every 4 (four) hours. 3AM, 7AM, 11AM, 3 PM, 7PM AND 11PM  ? promethazine (PHENERGAN) 25 MG tablet TAKE 1 TABLET BY MOUTH EVERY 8 HOURS AS NEEDED FOR NAUSEA OR VOMITING.  ? sucralfate (CARAFATE) 1 g tablet TAKE 1 TABLET BY MOUTH 4 TIMES A DAY WITH MEALS AND AT BEDTIME  ? tiotropium (SPIRIVA HANDIHALER) 18 MCG inhalation capsule Place 1 capsule (18 mcg total) into inhaler and inhale daily.  ? topiramate (TOPAMAX) 50 MG tablet Take 50 mg by mouth 2 (two) times daily.  ? [DISCONTINUED] prochlorperazine (COMPAZINE) 10 MG tablet TAKE 1 TABLET BY MOUTH EVERY 8 HOURS AS NEEDED FOR NAUSEA OR VOMITING  ? [DISCONTINUED] nicotine (NICODERM CQ - DOSED IN MG/24 HOURS) 21 mg/24hr patch Place 1 patch (21 mg total) onto the skin daily. (Patient not taking: Reported on 05/27/2021)  ? [DISCONTINUED] predniSONE (DELTASONE) 10 MG tablet Take 1  tablet (10 mg total) by mouth daily with breakfast. (Patient not taking: Reported on 05/27/2021)  ? ?No facility-administered medications prior to visit.  ? ? ?Review of Systems ? ?Last thyroid functions ?Lab Results  ?Component Value Date  ? TSH 1.110 05/16/2018  ? ?  ?  Objective  ?  ?BP (!) 149/81 (BP Location: Right Arm, Patient Position: Sitting, Cuff Size: Normal)   Pulse 79   Temp 98.2 ?F (36.8 ?C) (Temporal)   Resp 18   Wt 181 lb 11.2 oz (82.4 kg)   SpO2 97%   BMI 26.83 kg/m?  ?BP Readings from Last 3 Encounters:  ?07/24/21 (!) 149/81  ?03/04/21 134/79  ?01/01/21 128/80  ? ?Wt Readings from Last 3 Encounters:  ?07/24/21 181 lb 11.2  oz (82.4 kg)  ?03/04/21 184 lb (83.5 kg)  ?01/01/21 181 lb 12.8 oz (82.5 kg)  ? ?  ? ?Physical Exam ?Vitals reviewed.  ?Constitutional:   ?   Appearance: He is well-developed.  ?HENT:  ?   Head: Normocephalic and atraumatic.  ?   Right Ear: External ear normal.  ?   Left Ear: External ear normal.  ?   Nose: Nose normal.  ?Eyes:  ?   General: No scleral icterus. ?   Conjunctiva/sclera: Conjunctivae normal.  ?Neck:  ?   Thyroid: No thyromegaly.  ?Cardiovascular:  ?   Rate and Rhythm: Normal rate and regular rhythm.  ?   Heart sounds: Normal heart sounds.  ?Pulmonary:  ?   Effort: Pulmonary effort is normal.  ?   Breath sounds: Normal breath sounds.  ?Abdominal:  ?   General: There is no distension.  ?   Palpations: Abdomen is soft.  ?   Tenderness: There is no abdominal tenderness.  ?Lymphadenopathy:  ?   Cervical: No cervical adenopathy.  ?Skin: ?   General: Skin is warm and dry.  ?Neurological:  ?   General: No focal deficit present.  ?   Mental Status: He is alert and oriented to person, place, and time.  ?Psychiatric:     ?   Mood and Affect: Mood normal.     ?   Behavior: Behavior normal.     ?   Thought Content: Thought content normal.     ?   Judgment: Judgment normal.  ?  ? ? ?No results found for any visits on 07/24/21. ? Assessment & Plan  ? ?1. Chronic neck and back pain ?Given Toradol 60 mg IM ?CPE later this year. ?- ketorolac (TORADOL) injection 60 mg ?- CBC w/Diff/Platelet ?- Comprehensive Metabolic Panel (CMET) ?- Iron, TIBC and Ferritin Panel ?- TSH ? ?2. Chronic nausea ?Stop Phenergan and increase his Compazine to 2 at a as needed from 3 times daily as needed. ?Playing get back on his standard vent now which he tolerates well. ?- prochlorperazine (COMPAZINE) 10 MG tablet; Take 1 tablet (10 mg total) by mouth every 6 (six) hours as needed for nausea or vomiting.  Dispense: 90 tablet; Refill: 0 ?- CBC w/Diff/Platelet ?- Comprehensive Metabolic Panel (CMET) ?- Iron, TIBC and Ferritin Panel ?- TSH ? ?3.  Essential hypertension ?Fair control.  Check home blood pressure readings. ?- CBC w/Diff/Platelet ?- Comprehensive Metabolic Panel (CMET) ?- Iron, TIBC and Ferritin Panel ?- TSH ? ?4. Iron deficiency ? ?- CBC w/Diff/Platelet ?- Comprehensive Metabolic Panel (CMET) ?- Iron, TIBC and Ferritin Panel ?- TSH ? ?5. Other hyperlipidemia ? ?- CBC w/Diff/Platelet ?- Comprehensive Metabolic Panel (CMET) ?- Iron, TIBC and Ferritin Panel ?-  TSH ? ?6. Hypokalemia ? ?- CBC w/Diff/Platelet ?- Comprehensive Metabolic Panel (CMET) ?- Iron, TIBC and Ferritin Panel ?- TSH ? ? ? ?Return in about 2 months (around 09/23/2021).  ?   ? ?I, [name], MD, have reviewed all documentation for this visit. The documentation on 07/26/21 for the exam, diagnosis, procedures, and orders are all accurate and complete. ? ? ? ?Jazsmine Macari Cranford Mon, MD  ?[facility] ?[phone] (phone) ?[phone] (fax) ? ?[facility] ?

## 2021-07-25 LAB — COMPREHENSIVE METABOLIC PANEL
ALT: 10 IU/L (ref 0–44)
AST: 12 IU/L (ref 0–40)
Albumin/Globulin Ratio: 1.9 (ref 1.2–2.2)
Albumin: 4.5 g/dL (ref 3.8–4.8)
Alkaline Phosphatase: 69 IU/L (ref 44–121)
BUN/Creatinine Ratio: 16 (ref 10–24)
BUN: 14 mg/dL (ref 8–27)
Bilirubin Total: <0.2 mg/dL (ref 0.0–1.2)
CO2: 23 mmol/L (ref 20–29)
Calcium: 9.8 mg/dL (ref 8.6–10.2)
Chloride: 99 mmol/L (ref 96–106)
Creatinine, Ser: 0.9 mg/dL (ref 0.76–1.27)
Globulin, Total: 2.4 g/dL (ref 1.5–4.5)
Glucose: 82 mg/dL (ref 70–99)
Potassium: 4.4 mmol/L (ref 3.5–5.2)
Sodium: 136 mmol/L (ref 134–144)
Total Protein: 6.9 g/dL (ref 6.0–8.5)
eGFR: 96 mL/min/{1.73_m2} (ref >59)

## 2021-07-25 LAB — CBC WITH DIFFERENTIAL/PLATELET
Basophils Absolute: 0.1 10*3/uL (ref 0.0–0.2)
Basos: 1 %
EOS (ABSOLUTE): 0.3 10*3/uL (ref 0.0–0.4)
Eos: 3 %
Hematocrit: 48.8 % (ref 37.5–51.0)
Hemoglobin: 16.4 g/dL (ref 13.0–17.7)
Immature Grans (Abs): 0.1 10*3/uL (ref 0.0–0.1)
Immature Granulocytes: 1 %
Lymphocytes Absolute: 2.4 10*3/uL (ref 0.7–3.1)
Lymphs: 20 %
MCH: 30.9 pg (ref 26.6–33.0)
MCHC: 33.6 g/dL (ref 31.5–35.7)
MCV: 92 fL (ref 79–97)
Monocytes Absolute: 0.9 10*3/uL (ref 0.1–0.9)
Monocytes: 8 %
Neutrophils Absolute: 7.9 10*3/uL — ABNORMAL HIGH (ref 1.4–7.0)
Neutrophils: 67 %
Platelets: 354 10*3/uL (ref 150–450)
RBC: 5.31 x10E6/uL (ref 4.14–5.80)
RDW: 13.1 % (ref 11.6–15.4)
WBC: 11.6 10*3/uL — ABNORMAL HIGH (ref 3.4–10.8)

## 2021-07-25 LAB — IRON,TIBC AND FERRITIN PANEL
Ferritin: 166 ng/mL (ref 30–400)
Iron Saturation: 47 % (ref 15–55)
Iron: 135 ug/dL (ref 38–169)
Total Iron Binding Capacity: 289 ug/dL (ref 250–450)
UIBC: 154 ug/dL (ref 111–343)

## 2021-07-25 LAB — TSH: TSH: 1.41 u[IU]/mL (ref 0.450–4.500)

## 2021-08-06 DIAGNOSIS — G894 Chronic pain syndrome: Secondary | ICD-10-CM | POA: Diagnosis not present

## 2021-08-06 DIAGNOSIS — M47812 Spondylosis without myelopathy or radiculopathy, cervical region: Secondary | ICD-10-CM | POA: Diagnosis not present

## 2021-08-06 DIAGNOSIS — M47817 Spondylosis without myelopathy or radiculopathy, lumbosacral region: Secondary | ICD-10-CM | POA: Diagnosis not present

## 2021-08-06 DIAGNOSIS — M961 Postlaminectomy syndrome, not elsewhere classified: Secondary | ICD-10-CM | POA: Diagnosis not present

## 2021-08-26 ENCOUNTER — Other Ambulatory Visit: Payer: Self-pay | Admitting: Family Medicine

## 2021-08-26 DIAGNOSIS — G4733 Obstructive sleep apnea (adult) (pediatric): Secondary | ICD-10-CM

## 2021-08-27 ENCOUNTER — Other Ambulatory Visit: Payer: Self-pay | Admitting: Family Medicine

## 2021-08-27 DIAGNOSIS — G4733 Obstructive sleep apnea (adult) (pediatric): Secondary | ICD-10-CM

## 2021-09-03 DIAGNOSIS — M47817 Spondylosis without myelopathy or radiculopathy, lumbosacral region: Secondary | ICD-10-CM | POA: Diagnosis not present

## 2021-09-03 DIAGNOSIS — M961 Postlaminectomy syndrome, not elsewhere classified: Secondary | ICD-10-CM | POA: Diagnosis not present

## 2021-09-03 DIAGNOSIS — G894 Chronic pain syndrome: Secondary | ICD-10-CM | POA: Diagnosis not present

## 2021-09-03 DIAGNOSIS — M47812 Spondylosis without myelopathy or radiculopathy, cervical region: Secondary | ICD-10-CM | POA: Diagnosis not present

## 2021-09-09 ENCOUNTER — Encounter: Payer: PPO | Admitting: Family Medicine

## 2021-09-26 ENCOUNTER — Other Ambulatory Visit: Payer: Self-pay | Admitting: Family Medicine

## 2021-09-26 DIAGNOSIS — F32A Depression, unspecified: Secondary | ICD-10-CM

## 2021-09-30 ENCOUNTER — Other Ambulatory Visit: Payer: Self-pay | Admitting: Family Medicine

## 2021-09-30 DIAGNOSIS — R110 Nausea: Secondary | ICD-10-CM

## 2021-10-01 DIAGNOSIS — M961 Postlaminectomy syndrome, not elsewhere classified: Secondary | ICD-10-CM | POA: Diagnosis not present

## 2021-10-01 DIAGNOSIS — G894 Chronic pain syndrome: Secondary | ICD-10-CM | POA: Diagnosis not present

## 2021-10-01 DIAGNOSIS — M47812 Spondylosis without myelopathy or radiculopathy, cervical region: Secondary | ICD-10-CM | POA: Diagnosis not present

## 2021-10-01 DIAGNOSIS — M47817 Spondylosis without myelopathy or radiculopathy, lumbosacral region: Secondary | ICD-10-CM | POA: Diagnosis not present

## 2021-10-01 NOTE — Telephone Encounter (Signed)
Requested medication (s) are due for refill today: Yes  Requested medication (s) are on the active medication list: Yes  Last refill:  07/24/21  Future visit scheduled: Yes  Notes to clinic:  See request.    Requested Prescriptions  Pending Prescriptions Disp Refills   prochlorperazine (COMPAZINE) 10 MG tablet [Pharmacy Med Name: PROCHLORPERAZINE MALEATE 10 MG TAB] 90 tablet 0    Sig: TAKE 1 TABLET BY MOUTH EVER 8 HOURS AS NEEDED FOR NAUSEA OR VOMITING     Not Delegated - Gastroenterology: Antiemetics Failed - 09/30/2021 11:25 AM      Failed - This refill cannot be delegated      Passed - Valid encounter within last 6 months    Recent Outpatient Visits           2 months ago Chronic neck and back pain   [facility] Jerrol Banana., [facility]   7 months ago Essential hypertension   [facility] Jerrol Banana., MD   10 months ago Depression, unspecified depression type   [facility] Jerrol Banana., MD   1 year ago Mass in chest   [facility] Jerrol Banana., MD   1 year ago Chest pain, unspecified type   [facility] Jerrol Banana., MD       Future Appointments             In 4 months Jerrol Banana., MD [facility], [place]

## 2021-10-15 ENCOUNTER — Telehealth: Payer: Self-pay | Admitting: Pulmonary Disease

## 2021-10-15 MED ORDER — ALBUTEROL SULFATE HFA 108 (90 BASE) MCG/ACT IN AERS: 2.0000 | INHALATION_SPRAY | Freq: Four times a day (QID) | RESPIRATORY_TRACT | 1 refills | Status: DC | PRN

## 2021-10-15 MED ORDER — TRELEGY ELLIPTA 100-62.5-25 MCG/ACT IN AEPB: 1.0000 | INHALATION_SPRAY | Freq: Every day | RESPIRATORY_TRACT | 1 refills | Status: DC

## 2021-10-15 NOTE — Telephone Encounter (Signed)
I am okay with him having enough Trelegy until he is seen in August.  He needs to follow through with his appointments.  We have not seen him since August 2022.

## 2021-10-15 NOTE — Telephone Encounter (Signed)
Per Dr. Nestor Ramp to send in albuterol HFA.   Trelegy and albuterol has been sent to preferred pharmacy. Patient is aware and voiced his understanding.  Nothing further needed.

## 2021-10-15 NOTE — Telephone Encounter (Addendum)
Spoke to patient. He stated that he would like to switch back to trelegy. This was switched to Spiriva due to trelegy making him feel nervous.  He does not feel that Spiriva is effective. C/o increased SOB and wheezing. He would also like an albuterol inhaler.  He also requested to cancel 11/07/2021 appt in [place] and scheduled in [place]. Appt scheduled 12/29/2021  Dr. Patsey Berthold, please advise. Thanks

## 2021-10-23 ENCOUNTER — Other Ambulatory Visit: Payer: Self-pay | Admitting: Family Medicine

## 2021-10-23 NOTE — Telephone Encounter (Signed)
Requested medication (s) are due for refill today:   No  Requested medication (s) are on the active medication list:   No  Future visit scheduled:   Yes   Last ordered: Not on medication list  Returned because this is not on his med list   Requested Prescriptions  Pending Prescriptions Disp Refills   promethazine (PHENERGAN) 25 MG tablet [Pharmacy Med Name: PROMETHAZINE HCL 25 MG TAB] 35 tablet     Sig: TAKE 1 TABLET BY MOUTH EVERY 8 HOURS AS NEEDED FOR NAUSEA OR VOMITING.     Not Delegated - Gastroenterology: Antiemetics Failed - 10/23/2021  2:09 PM      Failed - This refill cannot be delegated      Passed - Valid encounter within last 6 months    Recent Outpatient Visits           3 months ago Chronic neck and back pain   [facility] Jerrol Banana., MD   7 months ago Essential hypertension   [facility] Jerrol Banana., MD   11 months ago Depression, unspecified depression type   [facility] Jerrol Banana., MD   1 year ago Mass in chest   [facility] Jerrol Banana., MD   1 year ago Chest pain, unspecified type   [facility] Jerrol Banana., MD       Future Appointments             In 2 months Tyler Pita, MD [facility]   In 4 months Jerrol Banana., MD [facility], [place]

## 2021-10-27 DIAGNOSIS — M47817 Spondylosis without myelopathy or radiculopathy, lumbosacral region: Secondary | ICD-10-CM | POA: Diagnosis not present

## 2021-10-27 DIAGNOSIS — M47812 Spondylosis without myelopathy or radiculopathy, cervical region: Secondary | ICD-10-CM | POA: Diagnosis not present

## 2021-10-27 DIAGNOSIS — G894 Chronic pain syndrome: Secondary | ICD-10-CM | POA: Diagnosis not present

## 2021-10-27 DIAGNOSIS — M961 Postlaminectomy syndrome, not elsewhere classified: Secondary | ICD-10-CM | POA: Diagnosis not present

## 2021-10-27 DIAGNOSIS — M5481 Occipital neuralgia: Secondary | ICD-10-CM | POA: Diagnosis not present

## 2021-10-30 ENCOUNTER — Other Ambulatory Visit: Payer: Self-pay | Admitting: Physician Assistant

## 2021-10-30 DIAGNOSIS — R110 Nausea: Secondary | ICD-10-CM

## 2021-11-07 ENCOUNTER — Ambulatory Visit: Payer: PPO | Admitting: Nurse Practitioner

## 2021-11-13 ENCOUNTER — Other Ambulatory Visit: Payer: Self-pay | Admitting: Family Medicine

## 2021-11-13 DIAGNOSIS — I10 Essential (primary) hypertension: Secondary | ICD-10-CM

## 2021-11-26 ENCOUNTER — Other Ambulatory Visit: Payer: Self-pay | Admitting: Family Medicine

## 2021-11-26 DIAGNOSIS — K219 Gastro-esophageal reflux disease without esophagitis: Secondary | ICD-10-CM

## 2021-11-27 DIAGNOSIS — G894 Chronic pain syndrome: Secondary | ICD-10-CM | POA: Diagnosis not present

## 2021-11-27 DIAGNOSIS — Z79891 Long term (current) use of opiate analgesic: Secondary | ICD-10-CM | POA: Diagnosis not present

## 2021-11-27 DIAGNOSIS — M47817 Spondylosis without myelopathy or radiculopathy, lumbosacral region: Secondary | ICD-10-CM | POA: Diagnosis not present

## 2021-11-27 DIAGNOSIS — M961 Postlaminectomy syndrome, not elsewhere classified: Secondary | ICD-10-CM | POA: Diagnosis not present

## 2021-11-27 DIAGNOSIS — M47812 Spondylosis without myelopathy or radiculopathy, cervical region: Secondary | ICD-10-CM | POA: Diagnosis not present

## 2021-11-28 ENCOUNTER — Other Ambulatory Visit: Payer: Self-pay | Admitting: Family Medicine

## 2021-11-28 DIAGNOSIS — R110 Nausea: Secondary | ICD-10-CM

## 2021-11-28 NOTE — Telephone Encounter (Signed)
Requested medications are due for refill today.  yes  Requested medications are on the active medications list.  yes  Last refill. 10/30/2021 #90 0 refills  Future visit scheduled.   yes  Notes to clinic.  Refill not delegated.    Requested Prescriptions  Pending Prescriptions Disp Refills   prochlorperazine (COMPAZINE) 10 MG tablet [Pharmacy Med Name: PROCHLORPERAZINE MALEATE 10 MG TAB] 90 tablet 0    Sig: TAKE 1 TABLET BY MOUTH EVERY 8 HOURS AS NEEDED FOR NAUSEA OR VOMITING     Not Delegated - Gastroenterology: Antiemetics Failed - 11/28/2021 11:04 AM      Failed - This refill cannot be delegated      Passed - Valid encounter within last 6 months    Recent Outpatient Visits           4 months ago Chronic neck and back pain   [facility] Jerrol Banana., MD   8 months ago Essential hypertension   [facility] Jerrol Banana., MD   1 year ago Depression, unspecified depression type   [facility] Jerrol Banana., MD   1 year ago Mass in chest   [facility] Jerrol Banana., MD   1 year ago Chest pain, unspecified type   [facility] Jerrol Banana., MD       Future Appointments             In 1 month Tyler Pita, MD [facility] [place]   In 2 months Jerrol Banana., MD [facility], [place]

## 2021-12-01 ENCOUNTER — Telehealth: Payer: Self-pay

## 2021-12-01 NOTE — Telephone Encounter (Signed)
Copied from [place]. Topic: General - Inquiry >> Dec 01, 2021 11:00 AM Erskine Squibb wrote: Reason for CRM: The patient called in stating he is very frustrated with not being able to get his nausea medicine because he has been on it since 2009 off on on. Please assist patient further

## 2021-12-01 NOTE — Telephone Encounter (Signed)
Rx was sent to pharmacy. 

## 2021-12-11 DIAGNOSIS — G8929 Other chronic pain: Secondary | ICD-10-CM | POA: Diagnosis not present

## 2021-12-11 DIAGNOSIS — M542 Cervicalgia: Secondary | ICD-10-CM | POA: Diagnosis not present

## 2021-12-11 DIAGNOSIS — M545 Low back pain, unspecified: Secondary | ICD-10-CM | POA: Diagnosis not present

## 2021-12-17 NOTE — Progress Notes (Signed)
Established patient visit  I,[name],acting as a scribe for Joshua Durie, MD.,have documented all relevant documentation on the behalf of Joshua Durie, MD,as directed by  Joshua Durie, MD while in the presence of Joshua Durie, MD.   Patient: Joshua Guerrero   DOB: [birth year]   66 y.o. Male  MRN: [id number] Visit Date: 12/18/2021  Today's healthcare provider: [name], MD   No chief complaint on file.  Subjective    HPI  Patient is a 66 year old male who presents for follow up of neck and back pain.  Patient also wants to discuss unexplained weight loss.  He has had an unintentional weight loss of 15 pounds this year.  He has lost that in the past 5 months.  Stress is up a little bit but he has no cough his energy is okay.  He does smoke a pack of cigarettes per day.  He has chronic nausea but he is always attributed this to his chronic narcotic use.  He is followed by physiatry for chronic neck and back pain. No GI symptoms.  Neurologic symptoms.  No night sweats or fevers. Medications: Outpatient Medications Prior to Visit  Medication Sig   albuterol (VENTOLIN HFA) 108 (90 Base) MCG/ACT inhaler Inhale 2 puffs into the lungs every 6 (six) hours as needed for wheezing or shortness of breath.   baclofen (LIORESAL) 10 MG tablet Take 10 mg by mouth 4 (four) times daily. STARTING AT LUNCHTIME   desvenlafaxine (PRISTIQ) 100 MG 24 hr tablet TAKE 1 TABLET BY MOUTH DAILY   fentaNYL (DURAGESIC - DOSED MCG/HR) 25 MCG/HR patch Place 1 patch onto the skin every 3 (three) days.   Fluticasone-Umeclidin-Vilant (TRELEGY ELLIPTA) 100-62.5-25 MCG/ACT AEPB Inhale 1 puff into the lungs daily.   hydrochlorothiazide (HYDRODIURIL) 25 MG tablet TAKE 1 TABLET BY MOUTH DAILY   hydrOXYzine (ATARAX) 50 MG tablet TAKE 1 TABLET BY MOUTH 3 TIMES DAILY AS NEEDED (USE AS NEEDED FOR SLEEP)   lansoprazole (PREVACID) 30 MG capsule TAKE ONE CAPSULE BY MOUTH TWICE A DAY BEFORE A  MEAL   meloxicam (MOBIC) 15 MG tablet Take 1 tablet (15 mg total) by mouth daily as needed for pain.   metoprolol succinate (TOPROL-XL) 50 MG 24 hr tablet TAKE 1 TABLET BY MOUTH DAILY WITH OR IMMEDIATELY FOLLOWING A MEAL.   oxyCODONE-acetaminophen (PERCOCET) 10-325 MG tablet Take 1 tablet by mouth every 4 (four) hours. 3AM, 7AM, 11AM, 3 PM, 7PM AND 11PM   prochlorperazine (COMPAZINE) 10 MG tablet TAKE 1 TABLET BY MOUTH EVERY 8 HOURS AS NEEDED FOR NAUSEA OR VOMITING   promethazine (PHENERGAN) 25 MG tablet TAKE 1 TABLET BY MOUTH EVERY 8 HOURS AS NEEDED FOR NAUSEA OR VOMITING.   sucralfate (CARAFATE) 1 g tablet TAKE 1 TABLET BY MOUTH 4 TIMES A DAY WITH MEALS AND AT BEDTIME   topiramate (TOPAMAX) 50 MG tablet Take 50 mg by mouth 2 (two) times daily.   No facility-administered medications prior to visit.    Review of Systems  Last metabolic panel Lab Results  Component Value Date   GLUCOSE 83 12/18/2021   NA 138 12/18/2021   K 4.3 12/18/2021   CL 101 12/18/2021   CO2 24 12/18/2021   BUN 16 12/18/2021   CREATININE 0.85 12/18/2021   EGFR 97 12/18/2021   CALCIUM 9.6 12/18/2021   PHOS 2.2 (L) 05/26/2016   PROT 6.5 12/18/2021   ALBUMIN 4.5 12/18/2021   LABGLOB 2.0 12/18/2021  AGRATIO 2.3 (H) 12/18/2021   BILITOT <0.2 12/18/2021   ALKPHOS 73 12/18/2021   AST 11 12/18/2021   ALT 10 12/18/2021   ANIONGAP 12 07/25/2020       Objective    There were no vitals taken for this visit. BP Readings from Last 3 Encounters:  12/18/21 134/77  07/24/21 (!) 149/81  03/04/21 134/79   Wt Readings from Last 3 Encounters:  12/18/21 167 lb (75.8 kg)  07/24/21 181 lb 11.2 oz (82.4 kg)  03/04/21 184 lb (83.5 kg)      Physical Exam Vitals reviewed.  Constitutional:      Appearance: He is well-developed.  HENT:     Head: Normocephalic and atraumatic.     Right Ear: External ear normal.     Left Ear: External ear normal.     Nose: Nose normal.  Eyes:     General: No scleral icterus.     Conjunctiva/sclera: Conjunctivae normal.  Neck:     Thyroid: No thyromegaly.  Cardiovascular:     Rate and Rhythm: Normal rate and regular rhythm.     Heart sounds: Normal heart sounds.  Pulmonary:     Effort: Pulmonary effort is normal.     Breath sounds: Normal breath sounds.  Abdominal:     General: There is no distension.     Palpations: Abdomen is soft.     Tenderness: There is no abdominal tenderness.  Lymphadenopathy:     Cervical: No cervical adenopathy.  Skin:    General: Skin is warm and dry.  Neurological:     General: No focal deficit present.     Mental Status: He is alert and oriented to person, place, and time.  Psychiatric:        Mood and Affect: Mood normal.        Behavior: Behavior normal.        Thought Content: Thought content normal.        Judgment: Judgment normal.       No results found for any visits on 12/18/21.  Assessment & Plan     1. Continuous tobacco abuse Advised to quit - DG Chest 2 View - CBC w/Diff/Platelet - Comprehensive Metabolic Panel (CMET) - TSH - Sed Rate (ESR)  2. Chronic neck and back pain Physiatry - CBC w/Diff/Platelet - Comprehensive Metabolic Panel (CMET) - TSH - Sed Rate (ESR)  3. Chronic nausea Patient is always felt this is secondary to his narcotic meds. - CBC w/Diff/Platelet - Comprehensive Metabolic Panel (CMET) - TSH - Sed Rate (ESR) - prochlorperazine (COMPAZINE) 10 MG tablet; Take 1 tablet (10 mg total) by mouth every 8 (eight) hours as needed.  Dispense: 90 tablet; Refill: 1 - promethazine (PHENERGAN) 25 MG tablet; Take 1 tablet (25 mg total) by mouth every 8 (eight) hours as needed.  Dispense: 45 tablet; Refill: 1  4. Essential hypertension  - CBC w/Diff/Platelet - Comprehensive Metabolic Panel (CMET) - TSH - Sed Rate (ESR) - hydrochlorothiazide (HYDRODIURIL) 25 MG tablet; Take 1 tablet (25 mg total) by mouth daily.  Dispense: 90 tablet; Refill: 3  5. Hypokalemia  - CBC w/Diff/Platelet -  Comprehensive Metabolic Panel (CMET) - TSH - Sed Rate (ESR)  6. Iron deficiency  - CBC w/Diff/Platelet - Comprehensive Metabolic Panel (CMET) - TSH - Sed Rate (ESR)  7. Unexplained weight loss , Especially concerned about this.  Obtain chest x-ray and baseline lab work.  May need CT scan and possible GI referral. - DG Chest 2 View -  CBC w/Diff/Platelet - Comprehensive Metabolic Panel (CMET) - TSH - Sed Rate (ESR)  8. Obstructive sleep apnea syndrome On CPAP. - hydrOXYzine (ATARAX) 50 MG tablet; TAKE 1 TABLET BY MOUTH 3 TIMES DAILY AS NEEDED (USE AS NEEDED FOR SLEEP)  Dispense: 90 tablet; Refill: 4   No follow-ups on file.      I, Joshua Durie, MD, have reviewed all documentation for this visit. The documentation on 12/23/21 for the exam, diagnosis, procedures, and orders are all accurate and complete.    Korvin Valentine Cranford Mon, MD  [facility] [phone] (phone) [phone] (fax)  [place]

## 2021-12-18 ENCOUNTER — Ambulatory Visit
Admission: RE | Admit: 2021-12-18 | Discharge: 2021-12-18 | Disposition: A | Discharge status: Home / Self Care | Payer: PPO | Attending: Family Medicine | Admitting: Family Medicine

## 2021-12-18 ENCOUNTER — Ambulatory Visit
Admission: RE | Admit: 2021-12-18 | Discharge: 2021-12-18 | Disposition: A | Discharge status: Home / Self Care | Payer: PPO | Source: Ambulatory Visit | Attending: Family Medicine | Admitting: Family Medicine

## 2021-12-18 ENCOUNTER — Ambulatory Visit (INDEPENDENT_AMBULATORY_CARE_PROVIDER_SITE_OTHER): Payer: PPO | Admitting: Family Medicine

## 2021-12-18 ENCOUNTER — Encounter: Payer: Self-pay | Admitting: Family Medicine

## 2021-12-18 VITALS — BP 134/77 | HR 60 | Resp 18 | Ht 69.0 in | Wt 167.0 lb

## 2021-12-18 DIAGNOSIS — F330 Major depressive disorder, recurrent, mild: Secondary | ICD-10-CM | POA: Diagnosis not present

## 2021-12-18 DIAGNOSIS — E611 Iron deficiency: Secondary | ICD-10-CM

## 2021-12-18 DIAGNOSIS — K219 Gastro-esophageal reflux disease without esophagitis: Secondary | ICD-10-CM

## 2021-12-18 DIAGNOSIS — Z720 Tobacco use: Secondary | ICD-10-CM | POA: Diagnosis not present

## 2021-12-18 DIAGNOSIS — M549 Dorsalgia, unspecified: Secondary | ICD-10-CM | POA: Diagnosis not present

## 2021-12-18 DIAGNOSIS — I10 Essential (primary) hypertension: Secondary | ICD-10-CM | POA: Diagnosis not present

## 2021-12-18 DIAGNOSIS — G8929 Other chronic pain: Secondary | ICD-10-CM

## 2021-12-18 DIAGNOSIS — R634 Abnormal weight loss: Secondary | ICD-10-CM | POA: Insufficient documentation

## 2021-12-18 DIAGNOSIS — M542 Cervicalgia: Secondary | ICD-10-CM

## 2021-12-18 DIAGNOSIS — G4733 Obstructive sleep apnea (adult) (pediatric): Secondary | ICD-10-CM | POA: Diagnosis not present

## 2021-12-18 DIAGNOSIS — R110 Nausea: Secondary | ICD-10-CM | POA: Diagnosis not present

## 2021-12-18 DIAGNOSIS — E876 Hypokalemia: Secondary | ICD-10-CM

## 2021-12-18 MED ORDER — HYDROCHLOROTHIAZIDE 25 MG PO TABS: 25.0000 mg | ORAL_TABLET | Freq: Every day | ORAL | 3 refills | Status: DC

## 2021-12-18 MED ORDER — PROCHLORPERAZINE MALEATE 10 MG PO TABS: 10.0000 mg | ORAL_TABLET | Freq: Three times a day (TID) | ORAL | 1 refills | Status: DC | PRN

## 2021-12-18 MED ORDER — HYDROXYZINE HCL 50 MG PO TABS: ORAL_TABLET | ORAL | 4 refills | Status: DC

## 2021-12-18 MED ORDER — PROMETHAZINE HCL 25 MG PO TABS: 25.0000 mg | ORAL_TABLET | Freq: Three times a day (TID) | ORAL | 1 refills | Status: DC | PRN

## 2021-12-19 LAB — CBC WITH DIFFERENTIAL/PLATELET
Basophils Absolute: 0.1 10*3/uL (ref 0.0–0.2)
Basos: 1 %
EOS (ABSOLUTE): 0.3 10*3/uL (ref 0.0–0.4)
Eos: 3 %
Hematocrit: 44 % (ref 37.5–51.0)
Hemoglobin: 14.8 g/dL (ref 13.0–17.7)
Immature Grans (Abs): 0 10*3/uL (ref 0.0–0.1)
Immature Granulocytes: 0 %
Lymphocytes Absolute: 2.4 10*3/uL (ref 0.7–3.1)
Lymphs: 26 %
MCH: 30.5 pg (ref 26.6–33.0)
MCHC: 33.6 g/dL (ref 31.5–35.7)
MCV: 91 fL (ref 79–97)
Monocytes Absolute: 0.7 10*3/uL (ref 0.1–0.9)
Monocytes: 8 %
Neutrophils Absolute: 5.7 10*3/uL (ref 1.4–7.0)
Neutrophils: 62 %
Platelets: 340 10*3/uL (ref 150–450)
RBC: 4.85 x10E6/uL (ref 4.14–5.80)
RDW: 13.2 % (ref 11.6–15.4)
WBC: 9.2 10*3/uL (ref 3.4–10.8)

## 2021-12-19 LAB — COMPREHENSIVE METABOLIC PANEL
ALT: 10 IU/L (ref 0–44)
AST: 11 IU/L (ref 0–40)
Albumin/Globulin Ratio: 2.3 — ABNORMAL HIGH (ref 1.2–2.2)
Albumin: 4.5 g/dL (ref 3.9–4.9)
Alkaline Phosphatase: 73 IU/L (ref 44–121)
BUN/Creatinine Ratio: 19 (ref 10–24)
BUN: 16 mg/dL (ref 8–27)
Bilirubin Total: <0.2 mg/dL (ref 0.0–1.2)
CO2: 24 mmol/L (ref 20–29)
Calcium: 9.6 mg/dL (ref 8.6–10.2)
Chloride: 101 mmol/L (ref 96–106)
Creatinine, Ser: 0.85 mg/dL (ref 0.76–1.27)
Globulin, Total: 2 g/dL (ref 1.5–4.5)
Glucose: 83 mg/dL (ref 70–99)
Potassium: 4.3 mmol/L (ref 3.5–5.2)
Sodium: 138 mmol/L (ref 134–144)
Total Protein: 6.5 g/dL (ref 6.0–8.5)
eGFR: 97 mL/min/{1.73_m2} (ref >59)

## 2021-12-19 LAB — SEDIMENTATION RATE: Sed Rate: 4 mm/hr (ref 0–30)

## 2021-12-19 LAB — TSH: TSH: 1.61 u[IU]/mL (ref 0.450–4.500)

## 2021-12-24 DIAGNOSIS — F419 Anxiety disorder, unspecified: Secondary | ICD-10-CM | POA: Diagnosis not present

## 2021-12-25 DIAGNOSIS — G894 Chronic pain syndrome: Secondary | ICD-10-CM | POA: Diagnosis not present

## 2021-12-25 DIAGNOSIS — M47817 Spondylosis without myelopathy or radiculopathy, lumbosacral region: Secondary | ICD-10-CM | POA: Diagnosis not present

## 2021-12-25 DIAGNOSIS — M961 Postlaminectomy syndrome, not elsewhere classified: Secondary | ICD-10-CM | POA: Diagnosis not present

## 2021-12-25 DIAGNOSIS — M47812 Spondylosis without myelopathy or radiculopathy, cervical region: Secondary | ICD-10-CM | POA: Diagnosis not present

## 2021-12-29 ENCOUNTER — Ambulatory Visit: Payer: PPO | Admitting: Pulmonary Disease

## 2022-01-01 DIAGNOSIS — F419 Anxiety disorder, unspecified: Secondary | ICD-10-CM | POA: Diagnosis not present

## 2022-01-07 ENCOUNTER — Other Ambulatory Visit: Payer: Self-pay | Admitting: Family Medicine

## 2022-01-07 DIAGNOSIS — R12 Heartburn: Secondary | ICD-10-CM

## 2022-01-14 ENCOUNTER — Telehealth: Payer: Self-pay

## 2022-01-14 NOTE — Telephone Encounter (Signed)
Copied from [place] [id number]. Topic: General - Inquiry >> Jan 14, 2022  9:42 AM Devoria Glassing wrote: Reason for CRM: pt wants Dr Rosanna Randy to advise him who would be the best dr (knowing his situation) that he needs to transfer to

## 2022-01-20 NOTE — Telephone Encounter (Signed)
Please advise 

## 2022-01-20 NOTE — Telephone Encounter (Signed)
Pt called back for update on request

## 2022-02-05 DIAGNOSIS — M47812 Spondylosis without myelopathy or radiculopathy, cervical region: Secondary | ICD-10-CM | POA: Diagnosis not present

## 2022-02-05 DIAGNOSIS — M47817 Spondylosis without myelopathy or radiculopathy, lumbosacral region: Secondary | ICD-10-CM | POA: Diagnosis not present

## 2022-02-05 DIAGNOSIS — M961 Postlaminectomy syndrome, not elsewhere classified: Secondary | ICD-10-CM | POA: Diagnosis not present

## 2022-02-05 DIAGNOSIS — G894 Chronic pain syndrome: Secondary | ICD-10-CM | POA: Diagnosis not present

## 2022-02-09 ENCOUNTER — Other Ambulatory Visit: Payer: Self-pay | Admitting: Family Medicine

## 2022-02-09 DIAGNOSIS — R110 Nausea: Secondary | ICD-10-CM

## 2022-02-10 ENCOUNTER — Other Ambulatory Visit: Payer: Self-pay | Admitting: Family Medicine

## 2022-02-10 DIAGNOSIS — R110 Nausea: Secondary | ICD-10-CM

## 2022-02-10 NOTE — Telephone Encounter (Signed)
Requested medication (s) are due for refill today -provider review   Requested medication (s) are on the active medication list -yes  Future visit scheduled -yes  Last refill: 12/18/21 #90 1RF  Notes to clinic: non delegated Rx  Requested Prescriptions  Pending Prescriptions Disp Refills   prochlorperazine (COMPAZINE) 10 MG tablet [Pharmacy Med Name: PROCHLORPERAZINE MALEATE 10 MG TAB] 90 tablet 1    Sig: TAKE 1 TABLET BY MOUTH EVERY 8 HOURS AS NEEDED     Not Delegated - Gastroenterology: Antiemetics Failed - 02/10/2022  9:39 AM      Failed - This refill cannot be delegated      Passed - Valid encounter within last 6 months    Recent Outpatient Visits           1 month ago Continuous tobacco abuse   [facility] Jerrol Banana., MD   6 months ago Chronic neck and back pain   [facility] Jerrol Banana., MD   11 months ago Essential hypertension   [facility] Jerrol Banana., MD   1 year ago Depression, unspecified depression type   [facility] Jerrol Banana., MD   1 year ago Mass in chest   [facility] Rosanna Randy, Retia Passe., MD       Future Appointments             In 1 week Tyler Pita, MD [facility] [place]   In 2 weeks Simmons-Robinson, Riki Sheer, MD [facility]               Requested Prescriptions  Pending Prescriptions Disp Refills   prochlorperazine (COMPAZINE) 10 MG tablet [Pharmacy Med Name: PROCHLORPERAZINE MALEATE 10 MG TAB] 90 tablet 1    Sig: TAKE 1 TABLET BY MOUTH EVERY 8 HOURS AS NEEDED     Not Delegated - Gastroenterology: Antiemetics Failed - 02/10/2022  9:39 AM      Failed - This refill cannot be delegated      Passed - Valid encounter within last 6 months    Recent Outpatient Visits           1 month ago Continuous tobacco abuse   [facility] Jerrol Banana., MD   6 months ago  Chronic neck and back pain   [facility] Jerrol Banana., MD   11 months ago Essential hypertension   [facility] Jerrol Banana., MD   1 year ago Depression, unspecified depression type   [facility] Jerrol Banana., MD   1 year ago Mass in chest   [facility] Jerrol Banana., MD       Future Appointments             In 1 week Tyler Pita, MD [facility] [place]   In 2 weeks Simmons-Robinson, Riki Sheer, MD [facility] [place]

## 2022-02-16 DIAGNOSIS — J069 Acute upper respiratory infection, unspecified: Secondary | ICD-10-CM | POA: Diagnosis not present

## 2022-02-18 ENCOUNTER — Ambulatory Visit: Payer: PPO | Admitting: Physician Assistant

## 2022-02-19 ENCOUNTER — Ambulatory Visit: Payer: PPO | Admitting: Physician Assistant

## 2022-02-19 ENCOUNTER — Other Ambulatory Visit: Payer: Self-pay | Admitting: Family Medicine

## 2022-02-19 DIAGNOSIS — R110 Nausea: Secondary | ICD-10-CM

## 2022-02-19 NOTE — Telephone Encounter (Signed)
Requested medication (s) are due for refill today: yes  Requested medication (s) are on the active medication list: yes  Last refill:  12/18/21  Future visit scheduled: yes  Notes to clinic:  Unable to refill per protocol, cannot delegate.      Requested Prescriptions  Pending Prescriptions Disp Refills   promethazine (PHENERGAN) 25 MG tablet [Pharmacy Med Name: PROMETHAZINE HCL 25 MG TAB] 45 tablet 1    Sig: TAKE 1 TABLET BY MOUTH EVERY 8 HOURS AS NEEDED     Not Delegated - Gastroenterology: Antiemetics Failed - 02/19/2022 11:13 AM      Failed - This refill cannot be delegated      Passed - Valid encounter within last 6 months    Recent Outpatient Visits           2 months ago Continuous tobacco abuse   [facility] Jerrol Banana., MD   7 months ago Chronic neck and back pain   [facility] Jerrol Banana., MD   11 months ago Essential hypertension   [facility] Jerrol Banana., MD   1 year ago Depression, unspecified depression type   [facility] Jerrol Banana., MD   1 year ago Mass in chest   [facility] Rosanna Randy, Retia Passe., MD       Future Appointments             Tomorrow Tyler Pita, MD [place]   In 6 days Simmons-Robinson, Riki Sheer, MD [facility] [place]

## 2022-02-20 ENCOUNTER — Encounter: Payer: Self-pay | Admitting: Pulmonary Disease

## 2022-02-20 ENCOUNTER — Ambulatory Visit (INDEPENDENT_AMBULATORY_CARE_PROVIDER_SITE_OTHER): Payer: PPO | Admitting: Pulmonary Disease

## 2022-02-20 VITALS — BP 124/82 | HR 72 | Temp 98.1°F | Ht 69.0 in | Wt 163.8 lb

## 2022-02-20 DIAGNOSIS — F17210 Nicotine dependence, cigarettes, uncomplicated: Secondary | ICD-10-CM

## 2022-02-20 DIAGNOSIS — J440 Chronic obstructive pulmonary disease with acute lower respiratory infection: Secondary | ICD-10-CM

## 2022-02-20 MED ORDER — METHYLPREDNISOLONE 4 MG PO TBPK: ORAL_TABLET | ORAL | 0 refills | Status: DC

## 2022-02-20 MED ORDER — AZITHROMYCIN 250 MG PO TABS: ORAL_TABLET | ORAL | 0 refills | Status: AC

## 2022-02-20 NOTE — Patient Instructions (Signed)
Sent in some medications to your pharmacy.  Should help you with your current symptoms.  We have sent in a referral to the lung cancer screening program so that you can get a CT yearly.  Continue to work on your efforts to quit smoking.  We will see you in follow-up in 3 months time call sooner should any new problems arise.

## 2022-02-20 NOTE — Progress Notes (Signed)
Subjective:    Patient ID: Joshua Guerrero, male    DOB: 1955/09/27, 66 y.o.   MRN: [id number] Patient Care Team: Maple Hudson., MD as PCP - General (Family Medicine) Maple Hudson., MD (Family Medicine) Midge Minium, MD as Consulting Physician (Gastroenterology) Verdon Cummins, MD as Referring Physician (Physical Medicine and Rehabilitation) System, Provider Not In [place], RN as Oncology Nurse Navigator  Chief Complaint  Patient presents with   Follow-up    COPD. SOB with exertion. No wheezing. Cough with yellow sputum. Taking mucinex.   HPI Patient is a 66 year old current smoker (half PPD) who presents for follow-up of COPD.initially the patient was evaluated for a lung "mass" and underwent  bronchoscopy on 29 July 2020 which showed only inflammatory debris, lymph node showed adequate sampling without malignancy.  He subsequently had percutaneous lung biopsy on 07 August 2020 which showed necroinflammatory material.  Given the radiographic picture and biopsy findings this was likely an organizing pneumonia in particular, cryptogenic organizing pneumonia.  Patient was treated with prednisone and this issue resolved.  He had pulmonary function testing performed 17 December 2020 which were consistent with asthma/COPD overlap.  This is mild in nature.  He has been maintained on Trelegy Ellipta 100 and as needed albuterol.  Use of albuterol is roughly 3-4 times per week.  He unfortunately continues to smoke 1/2 pack of cigarettes per day.   Over the last week he has noted cough productive of yellowish sputum.  He has had COVID test at home which was negative.  He does not endorse any fevers, chills or sweats.  He has taken Mucinex with only minimal relief of this.  He notices that his dyspnea on exertion is at baseline but better on Trelegy.  He has had no hemoptysis.  Does not endorse any other complaint.  Review of Systems A 10 point review of systems was performed and  it is as noted above otherwise negative.  Patient Active Problem List   Diagnosis Date Noted   Mild episode of recurrent major depressive disorder (HCC) 06/05/2020   Interstitial pneumonia (HCC) 06/05/2020   Chronic nausea    Acute peptic ulcer of stomach    Stricture and stenosis of esophagus    Hypokalemia 05/26/2016   Salicylate overdose 05/24/2016   Hypogammaglobulinemia (HCC) 12/19/2015   Benign prostatic hyperplasia 11/11/2015   Personal history of colonic polyps    Disease of colon    Benign neoplasm of descending colon    Benign neoplasm of sigmoid colon    Anxiety 09/22/2014   Cervical nerve root disorder 09/22/2014   Back pain, chronic 09/22/2014   Chronic neck and back pain 09/22/2014   Clinical depression 09/22/2014   Acid reflux 09/22/2014   HLD (hyperlipidemia) 09/22/2014   BP (high blood pressure) 09/22/2014   Iron deficiency 09/22/2014   Apnea, sleep 09/22/2014   Umbilical hernia without obstruction and without gangrene 09/22/2014   Social History   Tobacco Use   Smoking status: Every Day    Packs/day: 1.25    Years: 30.00    Additional pack years: 0.00    Total pack years: 37.50    Types: Cigarettes   Smokeless tobacco: Never   Tobacco comments:    currently about 1/2 PPD--02/20/2022  Substance Use Topics   Alcohol use: No   Allergies  Allergen Reactions   Acetaminophen Shortness Of Breath   Codeine Itching and Nausea And Vomiting   Opana [Oxymorphone] Swelling   Current Meds  Medication Sig   baclofen (LIORESAL) 10 MG tablet Take 10 mg by mouth 4 (four) times daily. STARTING AT LUNCHTIME   oxyCODONE-acetaminophen (PERCOCET) 10-325 MG tablet Take 1 tablet by mouth every 4 (four) hours. 3AM, 7AM, 11AM, 3 PM, 7PM AND 11PM   sucralfate (CARAFATE) 1 g tablet TAKE 1 TABLET BY MOUTH 4 TIMES A DAY WITH MEALS AND AT BEDTIME   albuterol (VENTOLIN HFA) 108 (90 Base) MCG/ACT inhaler Inhale 2 puffs into the lungs every 6 (six) hours as needed for wheezing  or shortness of breath.   [DISCONTINUED] buprenorphine (BUTRANS) 5 MCG/HR PTWK 1 patch once a week.   desvenlafaxine (PRISTIQ) 100 MG 24 hr tablet TAKE 1 TABLET BY MOUTH DAILY   Fluticasone-Umeclidin-Vilant (TRELEGY ELLIPTA) 100-62.5-25 MCG/ACT AEPB Inhale 1 puff into the lungs daily.   lansoprazole (PREVACID) 30 MG capsule TAKE ONE CAPSULE BY MOUTH TWICE A DAY BEFORE A MEAL   [DISCONTINUED] meloxicam (MOBIC) 15 MG tablet Take 1 tablet (15 mg total) by mouth daily as needed for pain.   metoprolol succinate (TOPROL-XL) 50 MG 24 hr tablet TAKE 1 TABLET BY MOUTH DAILY WITH OR IMMEDIATELY FOLLOWING A MEAL.   [DISCONTINUED] morphine (MS CONTIN) 15 MG 12 hr tablet Take 1 tablet by mouth as needed.   prochlorperazine (COMPAZINE) 10 MG tablet TAKE 1 TABLET BY MOUTH EVERY 8 HOURS AS NEEDED   promethazine (PHENERGAN) 25 MG tablet TAKE 1 TABLET BY MOUTH EVERY 8 HOURS AS NEEDED   Immunization History  Administered Date(s) Administered   Influenza,inj,Quad PF,6+ Mos 02/20/2015, 05/26/2016, 12/23/2016, 03/07/2018, 03/22/2019, 04/25/2020, 03/04/2021   PFIZER(Purple Top)SARS-COV-2 Vaccination 08/31/2019, 09/28/2019, 03/12/2020   PNEUMOCOCCAL CONJUGATE-20 02/25/2022   Pneumococcal Polysaccharide-23 05/26/2016   Td 08/17/2018   Tdap 07/31/2008      Objective:   Physical Exam BP 124/82 (BP Location: Left Arm, Cuff Size: Normal)   Pulse 72   Temp 98.1 F (36.7 C)   Ht 5\' 9"  (1.753 m)   Wt 163 lb 12.8 oz (74.3 kg)   SpO2 95%   BMI 24.19 kg/m   GENERAL: Well-developed, well-nourished gentleman, in no acute distress. HEAD: Normocephalic, atraumatic. EYES: Pupils equal, round, reactive to light.  No scleral icterus. MOUTH:  Nose/mouth/throat not examined due to masking requirements for COVID 19. NECK: Supple. No thyromegaly. Trachea midline. No JVD.  No adenopathy. PULMONARY: Good air entry bilaterally.    Scattered rhonchi, otherwise no adventitious sounds. CARDIOVASCULAR: S1 and S2. Regular rate and  rhythm.  No rubs murmurs or gallops heard. ABDOMEN: Benign. MUSCULOSKELETAL: No joint deformity, no clubbing, no edema. NEUROLOGIC: No focal deficit, no gait disturbance, speech is fluent. SKIN: Intact,warm,dry.  On limited exam no rashes. PSYCH: Behavior normal      Assessment & Plan:     ICD-10-CM   1. COPD with acute lower respiratory infection (HCC)  J44.0 Ambulatory Referral for Lung Cancer Scre   Continue Trelegy Ellipta Continue as needed albuterol Azithromycin Medrol taper    2. Tobacco dependence due to cigarettes  F17.210 Ambulatory Referral for Lung Cancer Scre   Patient was counseled regards discontinuation of smoking Total counseling time 3 to 5 minutes Referred to lung cancer screening     Orders Placed This Encounter  Procedures   Ambulatory Referral for Lung Cancer Scre    Referral Priority:   Routine    Referral Type:   Consultation    Referral Reason:   Specialty Services Required    Number of Visits Requested:   1   Meds ordered this  encounter  Medications   methylPREDNISolone (MEDROL DOSEPAK) 4 MG TBPK tablet    Sig: Take as directed in the package.    Dispense:  21 tablet    Refill:  0   azithromycin (ZITHROMAX) 250 MG tablet    Sig: Take 2 tablets (500 mg) on  Day 1,  followed by 1 tablet (250 mg) once daily on Days 2 through 5.    Dispense:  6 each    Refill:  0   We will see the patient in follow-up in 3 months time he is to contact us prior to that time should his symptoms fail to improve or worsen in the interim or if he has any new questions or concerns.  Gailen Shelter, MD Advanced Bronchoscopy PCCM [facility]    *This note was dictated using voice recognition software/Dragon.  Despite best efforts to proofread, errors can occur which can change the meaning. Any transcriptional errors that result from this process are unintentional and may not be fully corrected at the time of dictation.

## 2022-02-24 DIAGNOSIS — M47817 Spondylosis without myelopathy or radiculopathy, lumbosacral region: Secondary | ICD-10-CM | POA: Diagnosis not present

## 2022-02-24 DIAGNOSIS — G894 Chronic pain syndrome: Secondary | ICD-10-CM | POA: Diagnosis not present

## 2022-02-24 DIAGNOSIS — M47812 Spondylosis without myelopathy or radiculopathy, cervical region: Secondary | ICD-10-CM | POA: Diagnosis not present

## 2022-02-24 DIAGNOSIS — M961 Postlaminectomy syndrome, not elsewhere classified: Secondary | ICD-10-CM | POA: Diagnosis not present

## 2022-02-24 NOTE — Progress Notes (Signed)
I,[name],acting as a scribe for [employer], MD.,have documented all relevant documentation on the behalf of [name], MD,as directed by  [name], MD while in the presence of [name], MD.   Complete physical exam   Patient: Joshua Guerrero   DOB: Aug 07, 1955   66 y.o. Male  MRN: [id number] Visit Date: 02/25/2022  Today's healthcare provider: [name], MD   Chief Complaint  Patient presents with   Annual Exam   Subjective    Joshua Guerrero is a 66 y.o. male who presents today for a complete physical exam.  He reports consuming a general diet. Home exercise routine includes walks daily for 20-30 minutes. He generally feels fairly well. He reports sleeping fairly with medication otherwise has a hard time fall asleep. He does have additional problems to discuss today.  HPI   Health Maintenance Colonoscopy: last in 2016, 3 polyps and ischemic colitis found Influenza Vaccine: recommended -would like to complete today Covid Vaccine:recommended  TDAP: recommended -advised to get it at the pharmacy. PNA Vaccine:recommended-would like to complete today HIV Screening: recommended     Insomnia  He takes an OTC med from [employer] [facility]  He reports taking a total of 7 pills at times to get to sleep  He sometimes adds phenergan to get to sleep  For chronic pain, he reports that he was taken off the fentanyl by his pain management physician  He reports trial of morphine which he states did not go well He is now on buprenorphrine, which is helpful, he has recently had increased strength and states that he felt ill on the fentanyl and had nausea  He reports previously being on Xanax and would like to discuss restarting this medication to help with sleep because he found it helpful in the past  His nightly routine to wind down includes starting at 7:00 PM, he reports sleeping with his  large dog and he then starts to take his meds to aid with sleep  He leaves the TV on overnight, he has a bowl of ice cream to help with keeping up his weight  He reports that he continues to smoke cigarettes, he has cut back to 1/2 PPD where he used to smoke >1 PPD  He was diagnosed with sleep apnea, no longer needs cpap after surgery but after five years, he feels symptoms are back where they were before the CPAP  He states that he may average ~4 hours of sleep per night and identifies problems with falling asleep and some nights staying asleep because he has a hard time returning to sleep    Past Medical History:  Diagnosis Date   Arthritis    lower back   Brain bleed ([place]) 10/2016   AT [facility]-TREATED MEDICALLY NO SURGERY   Confusion caused by a drug    fentanyl and oxycodone   GERD (gastroesophageal reflux disease)    Headache    past hx, secondary to nerve damage from accident   Hypertension    CONTROLLED ON MEDS   Neck pain, chronic    Neuromuscular disorder ([place])    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   Sleep apnea    Not currently using a C-Pap   Past Surgical History:  Procedure Laterality Date   COLONOSCOPY WITH PROPOFOL N/A 02/11/2015   Procedure: COLONOSCOPY WITH PROPOFOL;  Surgeon: Lucilla Lame, MD;  Location: [facility];  Service: Endoscopy;  Laterality: N/A;  USES C-PAP   ESOPHAGEAL DILATION N/A 10/14/2018   Procedure: ESOPHAGEAL DILATION;  Surgeon: Lucilla Lame, MD;  Location: [facility];  Service: Endoscopy;  Laterality: N/A;   ESOPHAGOGASTRODUODENOSCOPY (EGD) WITH PROPOFOL N/A 10/14/2018   Procedure: ESOPHAGOGASTRODUODENOSCOPY (EGD) WITH BIOPSY;  Surgeon: Lucilla Lame, MD;  Location: [facility];  Service: Endoscopy;  Laterality: N/A;  sleep apnea   NASAL SINUS SURGERY     due to severe sleep apnea   NECK SURGERY     diffuse disc-and put in possibly screws patient thinks   SHOULDER SURGERY Bilateral    x 2   UMBILICAL HERNIA  REPAIR N/A 02/26/2017   Primary repair.  Surgeon: Robert Bellow, MD;  Location: [facility];  Service: General;  Laterality: N/A;   VIDEO BRONCHOSCOPY WITH ENDOBRONCHIAL NAVIGATION N/A 07/29/2020   Procedure: VIDEO BRONCHOSCOPY WITH ENDOBRONCHIAL NAVIGATION;  Surgeon: Tyler Pita, MD;  Location: [facility];  Service: Pulmonary;  Laterality: N/A;   VIDEO BRONCHOSCOPY WITH ENDOBRONCHIAL ULTRASOUND N/A 07/29/2020   Procedure: VIDEO BRONCHOSCOPY WITH ENDOBRONCHIAL ULTRASOUND;  Surgeon: Tyler Pita, MD;  Location: [facility];  Service: Pulmonary;  Laterality: N/A;   Social History   Socioeconomic History   Marital status: Divorced    Spouse name: single   Number of children: 0   Years of education: 12   Highest education level: High school graduate  Occupational History   Occupation: disability  Tobacco Use   Smoking status: Every Day    Packs/day: 1.25    Years: 30.00    Total pack years: 37.50    Types: Cigarettes   Smokeless tobacco: Never   Tobacco comments:    currently about 1/2 PPD--02/20/2022  Vaping Use   Vaping Use: Never used  Substance and Sexual Activity   Alcohol use: No   Drug use: No   Sexual activity: Never  Other Topics Concern   Not on file  Social History Narrative   Not on file   Social Determinants of Health   Financial Resource Strain: Medium Risk (05/27/2021)   Overall Financial Resource Strain (CARDIA)    Difficulty of Paying Living Expenses: Somewhat hard  Food Insecurity: No Food Insecurity (05/27/2021)   Hunger Vital Sign    Worried About Running Out of Food in the Last Year: Never true    [place] in the Last Year: Never true  Transportation Needs: No Transportation Needs (05/27/2021)   PRAPARE - Hydrologist (Medical): No    Lack of Transportation (Non-Medical): No  Physical Activity: Inactive (05/27/2021)   Exercise Vital Sign    Days of Exercise per Week: 0 days    Minutes of Exercise per Session:  0 min  Stress: No Stress Concern Present (05/21/2020)   [place]    Feeling of Stress : Only a little  Social Connections: Moderately Isolated (05/27/2021)   Social Connection and Isolation Panel [NHANES]    Frequency of Communication with Friends and Family: More than three times a week    Frequency of Social Gatherings with Friends and Family: Once a week    Attends Religious Services: More than 4 times per year    Active Member of [employer] or Organizations: No    Attends Archivist Meetings: Never    Marital Status: Divorced  Human resources officer Violence: Not At Risk (05/27/2021)   Humiliation, Afraid, Rape, and Kick questionnaire    Fear of Current or Ex-Partner:  No    Emotionally Abused: No    Physically Abused: No    Sexually Abused: No   Family Status  Relation Name Status   Father  Deceased   Mother  Deceased   Sister  Deceased   Mat Aunt  Deceased   Annamarie Major  (Not Specified)   Annamarie Major  (Not Specified)   Family History  Problem Relation Age of Onset   Dementia Father    Melanoma Mother    Cancer Paternal Uncle        unknown   Cancer Paternal Uncle        unknown   Allergies  Allergen Reactions   Acetaminophen Shortness Of Breath   Codeine Itching and Nausea And Vomiting   Opana [Oxymorphone] Swelling    Patient Care Team: Jerrol Banana., MD as PCP - General (Family Medicine) Jerrol Banana., MD (Family Medicine) Lucilla Lame, MD as Consulting Physician (Gastroenterology) Margaretha Sheffield, MD as Referring Physician (Physical Medicine and Rehabilitation) System, Provider Not In [place], Angie Fava, RN as Oncology Nurse Navigator   Medications: Outpatient Medications Prior to Visit  Medication Sig   albuterol (VENTOLIN HFA) 108 (90 Base) MCG/ACT inhaler Inhale 2 puffs into the lungs every 6 (six) hours as needed for wheezing or shortness of breath.   [EXPIRED] azithromycin  (ZITHROMAX) 250 MG tablet Take 2 tablets (500 mg) on  Day 1,  followed by 1 tablet (250 mg) once daily on Days 2 through 5.   baclofen (LIORESAL) 10 MG tablet Take 10 mg by mouth 4 (four) times daily. STARTING AT LUNCHTIME   buprenorphine (BUTRANS) 5 MCG/HR PTWK 1 patch once a week.   desvenlafaxine (PRISTIQ) 100 MG 24 hr tablet TAKE 1 TABLET BY MOUTH DAILY   Fluticasone-Umeclidin-Vilant (TRELEGY ELLIPTA) 100-62.5-25 MCG/ACT AEPB Inhale 1 puff into the lungs daily.   hydrochlorothiazide (HYDRODIURIL) 25 MG tablet Take 1 tablet (25 mg total) by mouth daily.   hydrOXYzine (ATARAX) 50 MG tablet TAKE 1 TABLET BY MOUTH 3 TIMES DAILY AS NEEDED (USE AS NEEDED FOR SLEEP)   lansoprazole (PREVACID) 30 MG capsule TAKE ONE CAPSULE BY MOUTH TWICE A DAY BEFORE A MEAL   meloxicam (MOBIC) 15 MG tablet Take 1 tablet (15 mg total) by mouth daily as needed for pain.   methylPREDNISolone (MEDROL DOSEPAK) 4 MG TBPK tablet Take as directed in the package.   metoprolol succinate (TOPROL-XL) 50 MG 24 hr tablet TAKE 1 TABLET BY MOUTH DAILY WITH OR IMMEDIATELY FOLLOWING A MEAL.   morphine (MS CONTIN) 15 MG 12 hr tablet Take 1 tablet by mouth as needed.   oxyCODONE-acetaminophen (PERCOCET) 10-325 MG tablet Take 1 tablet by mouth every 4 (four) hours. 3AM, 7AM, 11AM, 3 PM, 7PM AND 11PM   prochlorperazine (COMPAZINE) 10 MG tablet TAKE 1 TABLET BY MOUTH EVERY 8 HOURS AS NEEDED   promethazine (PHENERGAN) 25 MG tablet TAKE 1 TABLET BY MOUTH EVERY 8 HOURS AS NEEDED   sucralfate (CARAFATE) 1 g tablet TAKE 1 TABLET BY MOUTH 4 TIMES A DAY WITH MEALS AND AT BEDTIME   topiramate (TOPAMAX) 50 MG tablet Take 50 mg by mouth 2 (two) times daily.   fentaNYL (DURAGESIC - DOSED MCG/HR) 25 MCG/HR patch Place 1 patch onto the skin every 3 (three) days. (Patient not taking: Reported on 02/20/2022)   No facility-administered medications prior to visit.    Review of Systems  Constitutional:  Positive for fatigue.  HENT:  Positive for  congestion, rhinorrhea and voice change.  Respiratory:  Positive for apnea, cough and shortness of breath.   Gastrointestinal:  Positive for nausea.  Endocrine: Positive for polydipsia.  Genitourinary:  Positive for difficulty urinating.  Musculoskeletal:  Positive for arthralgias, back pain, joint swelling, myalgias, neck pain and neck stiffness.  Neurological:  Positive for numbness.  Psychiatric/Behavioral:  The patient is nervous/anxious.   All other systems reviewed and are negative.     Objective    BP (!) 143/76 (BP Location: Left Arm, Patient Position: Sitting, Cuff Size: Large) Comment: on Prednisone  Pulse 68   Temp 98.6 F (37 C) (Oral)   Resp 16   Ht '5\' 9"'$  (1.753 m)   Wt 162 lb 4.8 oz (73.6 kg)   BMI 23.97 kg/m     Physical Exam Vitals reviewed.  Constitutional:      General: He is not in acute distress.    Appearance: Normal appearance. He is not ill-appearing, toxic-appearing or diaphoretic.  HENT:     Head: Normocephalic and atraumatic.     Right Ear: Tympanic membrane and external ear normal.     Left Ear: Tympanic membrane and external ear normal.     Nose: Nose normal.     Mouth/Throat:     Mouth: Mucous membranes are moist.     Pharynx: No oropharyngeal exudate or posterior oropharyngeal erythema.  Eyes:     General: No scleral icterus.    Extraocular Movements: Extraocular movements intact.     Conjunctiva/sclera: Conjunctivae normal.     Pupils: Pupils are equal, round, and reactive to light.  Neck:     Comments: Pain with ROM manuevers  Cardiovascular:     Rate and Rhythm: Normal rate and regular rhythm.     Pulses: Normal pulses.     Heart sounds: Normal heart sounds. No murmur heard. Pulmonary:     Effort: Pulmonary effort is normal. No respiratory distress.     Breath sounds: No stridor. Examination of the right-upper field reveals wheezing. Examination of the left-upper field reveals wheezing. Examination of the right-middle field reveals  decreased breath sounds and wheezing. Examination of the left-middle field reveals decreased breath sounds and wheezing. Examination of the right-lower field reveals decreased breath sounds. Examination of the left-lower field reveals decreased breath sounds. Decreased breath sounds and wheezing present. No rhonchi or rales.  Abdominal:     General: Bowel sounds are normal. There is no distension.     Palpations: Abdomen is soft.     Tenderness: There is no abdominal tenderness.  Musculoskeletal:        General: No swelling, tenderness or signs of injury. Normal range of motion.     Cervical back: Rigidity present.     Right lower leg: No edema.     Left lower leg: No edema.  Lymphadenopathy:     Cervical: No cervical adenopathy.  Skin:    General: Skin is warm and dry.     Capillary Refill: Capillary refill takes less than 2 seconds.     Findings: No erythema or rash.  Neurological:     Mental Status: He is alert and oriented to person, place, and time.     Motor: No weakness.  Psychiatric:        Attention and Perception: Attention normal.        Mood and Affect: Mood normal.        Speech: Speech normal.        Behavior: Behavior normal. Behavior is cooperative.  Thought Content: Thought content normal.       Last depression screening scores    02/25/2022    1:56 PM 05/27/2021    1:09 PM 03/04/2021    2:57 PM  PHQ 2/9 Scores  PHQ - 2 Score 0 0 0  PHQ- 9 Score 4  0   Last fall risk screening    05/27/2021    1:12 PM  [place] in the past year? 0  Number falls in past yr: 0  Injury with Fall? 0  Risk for fall due to : No Fall Risks  Follow up Falls evaluation completed   Last Audit-C alcohol use screening    02/25/2022    1:57 PM  Alcohol Use Disorder Test (AUDIT)  1. How often do you have a drink containing alcohol? 0  2. How many drinks containing alcohol do you have on a typical day when you are drinking? 0  3. How often do you have six or more  drinks on one occasion? 0  AUDIT-C Score 0   A score of 3 or more in women, and 4 or more in men indicates increased risk for alcohol abuse, EXCEPT if all of the points are from question 1   No results found for any visits on 02/25/22.  Assessment & Plan    Routine Health Maintenance and Physical Exam  Exercise Activities and Dietary recommendations  Goals      DIET - EAT MORE FRUITS AND VEGETABLES     Quit Smoking     Recommend to quit smoking as planned in Spring 2021.         Immunization History  Administered Date(s) Administered   Influenza,inj,Quad PF,6+ Mos 02/20/2015, 05/26/2016, 12/23/2016, 03/07/2018, 03/22/2019, 04/25/2020, 03/04/2021, 02/25/2022   PFIZER(Purple Top)SARS-COV-2 Vaccination 08/31/2019, 09/28/2019, 03/12/2020   PNEUMOCOCCAL CONJUGATE-20 02/25/2022   Pneumococcal Polysaccharide-23 05/26/2016   Td 08/17/2018   Tdap 07/31/2008    Health Maintenance  Topic Date Due   Zoster Vaccines- Shingrix (1 of 2) Never done   COVID-19 Vaccine (4 - Pfizer series) 05/07/2020   Lung Cancer Screening  11/12/2021   Hepatitis C Screening  02/26/2023 (Originally 10/31/1973)   HIV Screening  02/26/2023 (Originally 10/30/1970)   Medicare Annual Wellness (AWV)  05/27/2022   COLONOSCOPY (Pts 45-17yr Insurance coverage will need to be confirmed)  02/10/2025   TETANUS/TDAP  08/16/2028   INFLUENZA VACCINE  Completed   HPV VACCINES  Aged Out    Discussed health benefits of physical activity, and encouraged him to engage in regular exercise appropriate for his age and condition.  Problem List Items Addressed This Visit       Cardiovascular and Mediastinum   BP (high blood pressure)    BP mildly elevated today  No change to medications  He will continue metoprolol '50mg'$  daily, HCTZ '25mg'$  daily          Respiratory   Apnea, sleep    Does not currently use CPAP machine  Follows with pulmonology  Continue trelegy inhaler and albuterol PRN         Other   Chronic  neck and back pain    Chronic, stable  Followed by pain management  Continue current regimen including buprenorphine 530m patch weekly  Recently D/C'd fentanyl patch  morphine '15mg'$  BID  Percocet 10-'325mg'$  every 4 hours  Compazine '10mg'$  TID and phenergan '25mg'$  TID PRN for both in setting of chronic nausea 2/2 to chronic opioids  Chronic nausea    Phenergan and compazine PRN  See chronic neck and back pain       Encounter for annual physical exam - Primary    Follow up in 1 year  Received influenza and PNA vaccines today        Primary insomnia    Chronic  Given multiple respiratory depressing pain medications and hx of sleep apnea in setting of less than ideal sleep hygiene, recommended against adding benzodiazepine to patient's current regimen After detailed review, recommended extended release melatonin, teas to help with sleep He declines recommendation to have dog sleep in different room as this has been their routine for several years  Recommended smoking cessation       Productive cough    Patient reports onset of productive cough and wheezing despite using inhalers as directed  He has reached out to his pulmonary physician regarding this problem  Reports negative covid test  Recommended chest XR today, patient is agreeable to this plan       Relevant Orders   DG Chest 2 View   Need for pneumococcal 20-valent conjugate vaccination   Relevant Orders   Pneumococcal conjugate vaccine 20-valent (Prevnar 20) (Completed)   Need for influenza vaccination   Relevant Orders   Flu Vaccine QUAD 6+ mos PF IM (Fluarix Quad PF) (Completed)     Return in about 3 months (around 05/28/2022) for med management .     I, [name], MD, have reviewed all documentation for this visit. The documentation on 02/27/22 for the exam, diagnosis, procedures, and orders are all accurate and complete.  Portions of this information were initially documented by the CMA and  reviewed by me for thoroughness and accuracy.      [name], MD  [facility] [phone] (phone) [phone] (fax)  [place]

## 2022-02-25 ENCOUNTER — Encounter: Payer: PPO | Admitting: Family Medicine

## 2022-02-25 ENCOUNTER — Ambulatory Visit
Admission: RE | Admit: 2022-02-25 | Discharge: 2022-02-25 | Disposition: A | Discharge status: Home / Self Care | Payer: PPO | Attending: Family Medicine | Admitting: Family Medicine

## 2022-02-25 ENCOUNTER — Encounter: Payer: Self-pay | Admitting: Family Medicine

## 2022-02-25 ENCOUNTER — Ambulatory Visit (INDEPENDENT_AMBULATORY_CARE_PROVIDER_SITE_OTHER): Payer: PPO | Admitting: Family Medicine

## 2022-02-25 ENCOUNTER — Ambulatory Visit
Admission: RE | Admit: 2022-02-25 | Discharge: 2022-02-25 | Disposition: A | Discharge status: Home / Self Care | Payer: PPO | Source: Ambulatory Visit | Attending: Family Medicine | Admitting: Family Medicine

## 2022-02-25 VITALS — BP 143/76 | HR 68 | Temp 98.6°F | Resp 16 | Ht 69.0 in | Wt 162.3 lb

## 2022-02-25 DIAGNOSIS — R058 Other specified cough: Secondary | ICD-10-CM

## 2022-02-25 DIAGNOSIS — M542 Cervicalgia: Secondary | ICD-10-CM | POA: Diagnosis not present

## 2022-02-25 DIAGNOSIS — Z0000 Encounter for general adult medical examination without abnormal findings: Secondary | ICD-10-CM

## 2022-02-25 DIAGNOSIS — Z23 Encounter for immunization: Secondary | ICD-10-CM

## 2022-02-25 DIAGNOSIS — J441 Chronic obstructive pulmonary disease with (acute) exacerbation: Secondary | ICD-10-CM | POA: Diagnosis not present

## 2022-02-25 DIAGNOSIS — I10 Essential (primary) hypertension: Secondary | ICD-10-CM | POA: Diagnosis not present

## 2022-02-25 DIAGNOSIS — G8929 Other chronic pain: Secondary | ICD-10-CM | POA: Diagnosis not present

## 2022-02-25 DIAGNOSIS — G4733 Obstructive sleep apnea (adult) (pediatric): Secondary | ICD-10-CM

## 2022-02-25 DIAGNOSIS — R059 Cough, unspecified: Secondary | ICD-10-CM | POA: Diagnosis not present

## 2022-02-25 DIAGNOSIS — F5101 Primary insomnia: Secondary | ICD-10-CM

## 2022-02-25 DIAGNOSIS — M549 Dorsalgia, unspecified: Secondary | ICD-10-CM | POA: Diagnosis not present

## 2022-02-25 DIAGNOSIS — R918 Other nonspecific abnormal finding of lung field: Secondary | ICD-10-CM | POA: Insufficient documentation

## 2022-02-25 DIAGNOSIS — R110 Nausea: Secondary | ICD-10-CM | POA: Diagnosis not present

## 2022-02-25 NOTE — Patient Instructions (Signed)
Healthy Living: Sleep Tips Sleep is an important part of a healthy lifestyle. In this video, you will learn about things you can do to help get quality sleep. To view the content, go to this web address: [email or web address]  This video will expire on: 01/07/2024. If you need access to this video following this date, please reach out to the healthcare provider who assigned it to you. This information is not intended to replace advice given to you by your health care provider. Make sure you discuss any questions you have with your health care provider. Elsevier Patient Education  [place].

## 2022-02-26 ENCOUNTER — Telehealth: Payer: Self-pay | Admitting: Pulmonary Disease

## 2022-02-26 NOTE — Telephone Encounter (Signed)
If he is improving he just needs to let things run their course.  The chest x-ray from yesterday did not show any acute process.  However if he continues to have fever he may need to go to the emergency room for evaluation as by definition he has failed outpatient therapy.

## 2022-02-26 NOTE — Telephone Encounter (Signed)
Called and spoke to patient.  He stated that he completed course of abx and prednisone today.  He reports of some improvement in sx but sx did not completely subside.  C/o chest congestion, prod cough with yellow to green sputum, wheezing, SOB with exertion, temp of 100 to days ago and chills. Negative covid test 02/19/2022. Using albuterol HFA 5-6x daily and trelegy once daily.  CXR yesterday with PCP.   Dr. Patsey Berthold, please advise. Thanks

## 2022-02-26 NOTE — Telephone Encounter (Signed)
Patient is aware of below message/recommendations and voiced his understanding.  Nothing further needed.  

## 2022-02-27 DIAGNOSIS — Z23 Encounter for immunization: Secondary | ICD-10-CM | POA: Insufficient documentation

## 2022-02-27 DIAGNOSIS — F5101 Primary insomnia: Secondary | ICD-10-CM | POA: Insufficient documentation

## 2022-02-27 DIAGNOSIS — R058 Other specified cough: Secondary | ICD-10-CM | POA: Insufficient documentation

## 2022-02-27 DIAGNOSIS — Z0000 Encounter for general adult medical examination without abnormal findings: Secondary | ICD-10-CM | POA: Insufficient documentation

## 2022-02-27 NOTE — Assessment & Plan Note (Signed)
Phenergan and compazine PRN  See chronic neck and back pain

## 2022-02-27 NOTE — Assessment & Plan Note (Signed)
Follow up in 1 year  Received influenza and PNA vaccines today

## 2022-02-27 NOTE — Assessment & Plan Note (Addendum)
Chronic, stable  Followed by pain management  Continue current regimen including buprenorphine 77mg patch weekly  Recently D/C'd fentanyl patch  morphine '15mg'$  BID  Percocet 10-'325mg'$  every 4 hours  Compazine '10mg'$  TID and phenergan '25mg'$  TID PRN for both in setting of chronic nausea 2/2 to chronic opioids

## 2022-02-27 NOTE — Assessment & Plan Note (Signed)
Chronic  Given multiple respiratory depressing pain medications and hx of sleep apnea in setting of less than ideal sleep hygiene, recommended against adding benzodiazepine to patient's current regimen After detailed review, recommended extended release melatonin, teas to help with sleep He declines recommendation to have dog sleep in different room as this has been their routine for several years  Recommended smoking cessation

## 2022-02-27 NOTE — Assessment & Plan Note (Signed)
Does not currently use CPAP machine  Follows with pulmonology  Continue trelegy inhaler and albuterol PRN

## 2022-02-27 NOTE — Assessment & Plan Note (Signed)
Patient reports onset of productive cough and wheezing despite using inhalers as directed  He has reached out to his pulmonary physician regarding this problem  Reports negative covid test  Recommended chest XR today, patient is agreeable to this plan

## 2022-02-27 NOTE — Assessment & Plan Note (Signed)
BP mildly elevated today  No change to medications  He will continue metoprolol '50mg'$  daily, HCTZ '25mg'$  daily

## 2022-03-02 ENCOUNTER — Other Ambulatory Visit: Payer: Self-pay | Admitting: Family Medicine

## 2022-03-17 DIAGNOSIS — M961 Postlaminectomy syndrome, not elsewhere classified: Secondary | ICD-10-CM | POA: Diagnosis not present

## 2022-03-17 DIAGNOSIS — M47817 Spondylosis without myelopathy or radiculopathy, lumbosacral region: Secondary | ICD-10-CM | POA: Diagnosis not present

## 2022-03-17 DIAGNOSIS — M47812 Spondylosis without myelopathy or radiculopathy, cervical region: Secondary | ICD-10-CM | POA: Diagnosis not present

## 2022-03-17 DIAGNOSIS — G894 Chronic pain syndrome: Secondary | ICD-10-CM | POA: Diagnosis not present

## 2022-03-25 ENCOUNTER — Telehealth: Payer: Self-pay | Admitting: Family Medicine

## 2022-03-25 DIAGNOSIS — I10 Essential (primary) hypertension: Secondary | ICD-10-CM

## 2022-03-25 MED ORDER — METOPROLOL SUCCINATE ER 50 MG PO TB24: ORAL_TABLET | ORAL | 3 refills | Status: DC

## 2022-03-25 NOTE — Telephone Encounter (Signed)
[facility] faxed refill request for the following medications:  metoprolol succinate (TOPROL-XL) 50 MG 24 hr tablet    Please advise.

## 2022-03-30 ENCOUNTER — Other Ambulatory Visit: Payer: Self-pay | Admitting: Family Medicine

## 2022-03-30 DIAGNOSIS — F32A Depression, unspecified: Secondary | ICD-10-CM

## 2022-03-30 NOTE — Telephone Encounter (Signed)
[facility] faxed refill request for the following medications:   desvenlafaxine (PRISTIQ) 100 MG 24 hr tablet    Please advise

## 2022-03-30 NOTE — Telephone Encounter (Signed)
Pt called about refill for Pristiq he stated that his pharmacy advised that they sent a request 4 times // please advise

## 2022-03-31 ENCOUNTER — Telehealth: Payer: Self-pay

## 2022-03-31 NOTE — Telephone Encounter (Signed)
Please Review

## 2022-03-31 NOTE — Telephone Encounter (Signed)
Copied from [place] [id number]. Topic: General - Inquiry >> Mar 31, 2022 10:42 AM Penni Bombard wrote: Reason for CRM: pt called saying the last time he was in he talked to the provider about using Trazodone for sleep.  He says he would like to try it now.  CB@  [phone]

## 2022-03-31 NOTE — Telephone Encounter (Signed)
Requested medication (s) are due for refill today: yes  Requested medication (s) are on the active medication list: yes  Last refill:  09/27/21 #90 1 refills  Future visit scheduled: yes in 1 month with Dr. Quentin Cornwall  Notes to clinic:  last ordered by Dr. Rosanna Randy. Patient reports he is out of medication. Do you want to refill Rx?     Requested Prescriptions  Pending Prescriptions Disp Refills   desvenlafaxine (PRISTIQ) 100 MG 24 hr tablet 90 tablet 1    Sig: Take 1 tablet (100 mg total) by mouth daily.     Psychiatry: Antidepressants - SNRI - desvenlafaxine & venlafaxine Failed - 03/31/2022  2:17 PM      Failed - Last BP in normal range    BP Readings from Last 1 Encounters:  02/25/22 (!) 143/76         Failed - Lipid Panel in normal range within the last 12 months    Cholesterol, Total  Date Value Ref Range Status  08/25/2017 175 100 - 199 mg/dL Final   LDL Calculated  Date Value Ref Range Status  08/25/2017 113 (H) 0 - 99 mg/dL Final   HDL  Date Value Ref Range Status  08/25/2017 26 (L) >39 mg/dL Final   Triglycerides  Date Value Ref Range Status  08/25/2017 180 (H) 0 - 149 mg/dL Final         Passed - Cr in normal range and within 360 days    Creatinine  Date Value Ref Range Status  12/12/2011 1.09 0.60 - 1.30 mg/dL Final   Creatinine, Ser  Date Value Ref Range Status  12/18/2021 0.85 0.76 - 1.27 mg/dL Final         Passed - Completed PHQ-2 or PHQ-9 in the last 360 days      Passed - Valid encounter within last 6 months    Recent Outpatient Visits           1 month ago Encounter for annual physical exam   [employer] [facility], [place], MD   3 months ago Continuous tobacco abuse   [facility] Jerrol Banana., MD   8 months ago Chronic neck and back pain   [facility] Jerrol Banana., MD   1 year ago Essential hypertension   [facility] Jerrol Banana., MD   1  year ago Depression, unspecified depression type   [facility] Jerrol Banana., MD       Future Appointments             In 1 month Simmons-Robinson, Riki Sheer, MD [facility], [place]

## 2022-03-31 NOTE — Telephone Encounter (Signed)
Pt called in to follow up on refill request for Pristiq, pt says that he's been without his medication for a few days. Pt would like to have sent in as soon as possible.    Please assist pt further.

## 2022-04-01 ENCOUNTER — Other Ambulatory Visit: Payer: Self-pay | Admitting: Family Medicine

## 2022-04-01 ENCOUNTER — Telehealth: Payer: Self-pay | Admitting: Family Medicine

## 2022-04-01 MED ORDER — DESVENLAFAXINE SUCCINATE ER 100 MG PO TB24: 100.0000 mg | ORAL_TABLET | Freq: Every day | ORAL | 1 refills | Status: DC

## 2022-04-01 NOTE — Telephone Encounter (Signed)
Venlafaxine and fentanyl listed as interacting agents increasing risk of serotonin syndrome with trazodone. Would recommend other nonpharmacologic agents to help with sleep to reduce risk of interactions.    [name], MD  [facility]

## 2022-04-01 NOTE — Telephone Encounter (Signed)
[facility] faxed refill request for the following medications:   desvenlafaxine (PRISTIQ) 100 MG 24 hr tablet     Please advise

## 2022-04-01 NOTE — Telephone Encounter (Signed)
Duplicate

## 2022-04-02 ENCOUNTER — Other Ambulatory Visit: Payer: Self-pay | Admitting: Pulmonary Disease

## 2022-04-02 NOTE — Telephone Encounter (Signed)
Called, left VM to call back in regards to medication refill.

## 2022-04-02 NOTE — Telephone Encounter (Signed)
Message from Dr. Alba Cory given to pt. Pt stated that he has tried nopharmacologic agents in the past and nothing helped. Pt stated he was drinking alsohol years ago for sleep. Pt stated he was put on Xanax by Dr. Rosanna Randy.

## 2022-04-02 NOTE — Telephone Encounter (Signed)
Called patient to advise. LMTCB. Okay for PEC to advise.

## 2022-04-06 NOTE — Telephone Encounter (Signed)
Patient has limited options that I would be comfortable prescribing at this point given other medications and medical hx.   Would recommend an in person visit at next available if he prefers to discuss in person, otherwise, I would recommend discussing with his psychiatrist or at our Jan 2024 visit.   [name], MD  [facility]

## 2022-04-15 DIAGNOSIS — G894 Chronic pain syndrome: Secondary | ICD-10-CM | POA: Diagnosis not present

## 2022-04-15 DIAGNOSIS — M961 Postlaminectomy syndrome, not elsewhere classified: Secondary | ICD-10-CM | POA: Diagnosis not present

## 2022-04-15 DIAGNOSIS — M47817 Spondylosis without myelopathy or radiculopathy, lumbosacral region: Secondary | ICD-10-CM | POA: Diagnosis not present

## 2022-04-15 DIAGNOSIS — M47812 Spondylosis without myelopathy or radiculopathy, cervical region: Secondary | ICD-10-CM | POA: Diagnosis not present

## 2022-04-17 ENCOUNTER — Telehealth: Payer: Self-pay | Admitting: Family Medicine

## 2022-04-17 DIAGNOSIS — R110 Nausea: Secondary | ICD-10-CM

## 2022-04-17 NOTE — Telephone Encounter (Signed)
[facility] faxed refill request for the following medications:   promethazine (PHENERGAN) 25 MG tablet    Please advise

## 2022-04-21 ENCOUNTER — Telehealth: Payer: Self-pay | Admitting: Pulmonary Disease

## 2022-04-21 MED ORDER — PROCHLORPERAZINE MALEATE 10 MG PO TABS: 10.0000 mg | ORAL_TABLET | Freq: Three times a day (TID) | ORAL | 1 refills | Status: DC | PRN

## 2022-04-21 MED ORDER — PROMETHAZINE HCL 25 MG PO TABS: 25.0000 mg | ORAL_TABLET | Freq: Three times a day (TID) | ORAL | 1 refills | Status: DC | PRN

## 2022-04-21 NOTE — Telephone Encounter (Signed)
We have not received a fax from [place]. I left her a message to call me back to discuss.

## 2022-04-21 NOTE — Telephone Encounter (Signed)
prochlorperazine (COMPAZINE) 10 MG tablet   Pt also requests that this is refilled as well, he says it is due tomorrow

## 2022-04-21 NOTE — Telephone Encounter (Signed)
Patient is very upset because he has been waiting since Friday for this medication to be called in.   Can you please refill this for him today??

## 2022-04-21 NOTE — Telephone Encounter (Signed)
refilled 

## 2022-04-22 NOTE — Telephone Encounter (Signed)
What forms are these?  I signed forms for him last week.

## 2022-04-22 NOTE — Telephone Encounter (Signed)
Paperwork has been signed and faxed back. Nothing further needed.

## 2022-04-22 NOTE — Telephone Encounter (Signed)
I have received the paperwork regarding the patient's inhaler.  I have placed it in Dr. Domingo Dimes folder for review.

## 2022-05-12 DIAGNOSIS — M47812 Spondylosis without myelopathy or radiculopathy, cervical region: Secondary | ICD-10-CM | POA: Diagnosis not present

## 2022-05-12 DIAGNOSIS — M961 Postlaminectomy syndrome, not elsewhere classified: Secondary | ICD-10-CM | POA: Diagnosis not present

## 2022-05-12 DIAGNOSIS — G894 Chronic pain syndrome: Secondary | ICD-10-CM | POA: Diagnosis not present

## 2022-05-12 DIAGNOSIS — M47817 Spondylosis without myelopathy or radiculopathy, lumbosacral region: Secondary | ICD-10-CM | POA: Diagnosis not present

## 2022-05-27 ENCOUNTER — Telehealth: Payer: Self-pay

## 2022-05-27 ENCOUNTER — Other Ambulatory Visit: Payer: Self-pay | Admitting: Pulmonary Disease

## 2022-05-27 NOTE — Telephone Encounter (Signed)
Received a PA on Lansoprazole for a 90 day supply. Patient's insurance card not on file. Called and spoke to patient and reported they will stop by the office to bring new insurance card. Need new card to start PA.

## 2022-05-27 NOTE — Telephone Encounter (Signed)
Copied from [place] [id number]. Topic: General - Other >> May 27, 2022 11:14 AM Cyndi Bender wrote: Reason for CRM: Pt reports that he is in too much pain to bring his insurance card in to the office before his appt today. Pt stated he understands if the appt has to be cancelled and rescheduled. Pt requests call back to advise.

## 2022-05-28 ENCOUNTER — Ambulatory Visit (INDEPENDENT_AMBULATORY_CARE_PROVIDER_SITE_OTHER): Payer: Medicare Other

## 2022-05-28 ENCOUNTER — Other Ambulatory Visit: Payer: Self-pay | Admitting: Family Medicine

## 2022-05-28 VITALS — Ht 67.0 in | Wt 162.0 lb

## 2022-05-28 DIAGNOSIS — Z0000 Encounter for general adult medical examination without abnormal findings: Secondary | ICD-10-CM

## 2022-05-28 DIAGNOSIS — H9193 Unspecified hearing loss, bilateral: Secondary | ICD-10-CM

## 2022-05-28 NOTE — Progress Notes (Signed)
Virtual Visit via Telephone Note  I connected with  Joshua Guerrero on 05/28/22 at  1:00 PM EST by telephone and verified that I am speaking with the correct person using two identifiers.  Location: Patient: home Provider: [facility] Persons participating in the virtual visit: patient/Nurse Health Advisor   I discussed the limitations, risks, security and privacy concerns of performing an evaluation and management service by telephone and the availability of in person appointments. The patient expressed understanding and agreed to proceed.  Interactive audio and video telecommunications were attempted between this nurse and patient, however failed, due to patient having technical difficulties OR patient did not have access to video capability.  We continued and completed visit with audio only.  Some vital signs may be absent or patient reported.   Roger Shelter, LPN  Subjective:   Joshua Guerrero is a 67 y.o. male who presents for Medicare Annual/Subsequent preventive examination.  Review of Systems     Cardiac Risk Factors include: advanced age (>60mn, >>58women);smoking/ tobacco exposure;hypertension     Objective:    Today's Vitals   05/28/22 1300 05/28/22 1302  Weight: 162 lb (73.5 kg)   Height: '5\' 7"'$  (1.702 m)   PainSc:  7    Body mass index is 25.37 kg/m.     05/28/2022    1:25 PM 05/27/2021    1:11 PM 08/19/2020    2:00 PM 07/29/2020   11:53 AM 07/25/2020    3:08 PM 07/15/2020   11:11 AM 06/21/2020   11:16 AM  Advanced Directives  Does Patient Have a Medical Advance Directive? No Yes Yes Yes Yes Yes Yes  Type of Advance Directive  Healthcare Power of Attorney Living will Living will Living will Living will Living will  Does patient want to make changes to medical advance directive?  Yes (Inpatient - patient defers changing a medical advance directive and declines information at this time)  No - Patient declined No - Guardian declined    Copy of [place] Chart?  Yes - validated most recent copy scanned in chart (See row information)       Would patient like information on creating a medical advance directive? No - Patient declined          Current Medications (verified) Outpatient Encounter Medications as of 05/28/2022  Medication Sig   albuterol (VENTOLIN HFA) 108 (90 Base) MCG/ACT inhaler INHALE 2 PUFFS BY MOUTH INTO THE LUNGS EVERY 6 HOURS AS NEEDED FOR WHEEZING OR SHORTNESS OF BREATH.   baclofen (LIORESAL) 10 MG tablet Take 10 mg by mouth 4 (four) times daily. STARTING AT LUNCHTIME   desvenlafaxine (PRISTIQ) 100 MG 24 hr tablet Take 1 tablet (100 mg total) by mouth daily.   fentaNYL (DURAGESIC - DOSED MCG/HR) 25 MCG/HR patch Place 1 patch onto the skin every 3 (three) days.   hydrOXYzine (ATARAX) 50 MG tablet TAKE 1 TABLET BY MOUTH 3 TIMES DAILY AS NEEDED (USE AS NEEDED FOR SLEEP)   lansoprazole (PREVACID) 30 MG capsule TAKE ONE CAPSULE BY MOUTH TWICE A DAY BEFORE A MEAL   meloxicam (MOBIC) 15 MG tablet Take 1 tablet (15 mg total) by mouth daily as needed for pain.   metoprolol succinate (TOPROL-XL) 50 MG 24 hr tablet Take with or immediately following a meal.   oxyCODONE-acetaminophen (PERCOCET) 10-325 MG tablet Take 1 tablet by mouth every 4 (four) hours. 3AM, 7AM, 11AM, 3 PM, 7PM AND 11PM   prochlorperazine (COMPAZINE) 10 MG tablet Take  1 tablet (10 mg total) by mouth every 8 (eight) hours as needed.   promethazine (PHENERGAN) 25 MG tablet Take 1 tablet (25 mg total) by mouth every 8 (eight) hours as needed.   sucralfate (CARAFATE) 1 g tablet TAKE 1 TABLET BY MOUTH 4 TIMES A DAY WITH MEALS AND AT BEDTIME   topiramate (TOPAMAX) 50 MG tablet Take 50 mg by mouth 2 (two) times daily.   TRELEGY ELLIPTA 100-62.5-25 MCG/ACT AEPB INHALE 1 PUFF BY MOUTH INTO THE LUNGS DAILY.   buprenorphine (BUTRANS) 5 MCG/HR PTWK 1 patch once a week. (Patient not taking: Reported on 05/28/2022)   hydrochlorothiazide (HYDRODIURIL) 25 MG tablet Take 1  tablet (25 mg total) by mouth daily.   methylPREDNISolone (MEDROL DOSEPAK) 4 MG TBPK tablet Take as directed in the package. (Patient not taking: Reported on 05/28/2022)   morphine (MS CONTIN) 15 MG 12 hr tablet Take 1 tablet by mouth as needed. (Patient not taking: Reported on 05/28/2022)   No facility-administered encounter medications on file as of 05/28/2022.    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   Sleep apnea    Not currently using a C-Pap   Past Surgical History:  Procedure Laterality Date   COLONOSCOPY WITH PROPOFOL N/A 02/11/2015   Procedure: COLONOSCOPY WITH PROPOFOL;  Surgeon: Lucilla Lame, MD;  Location: [facility];  Service: Endoscopy;  Laterality: N/A;  USES C-PAP   ESOPHAGEAL DILATION N/A 10/14/2018   Procedure: ESOPHAGEAL DILATION;  Surgeon: Lucilla Lame, MD;  Location: [place];  Service: Endoscopy;  Laterality: N/A;   ESOPHAGOGASTRODUODENOSCOPY (EGD) WITH PROPOFOL N/A 10/14/2018   Procedure: ESOPHAGOGASTRODUODENOSCOPY (EGD) WITH BIOPSY;  Surgeon: Lucilla Lame, MD;  Location: [place];  Service: Endoscopy;  Laterality: N/A;  sleep apnea   NASAL SINUS SURGERY     due to severe sleep apnea   NECK SURGERY     diffuse disc-and put in possibly screws patient thinks   SHOULDER SURGERY Bilateral    x 2   UMBILICAL HERNIA REPAIR N/A 02/26/2017   Primary repair.  Surgeon: Robert Bellow, MD;  Location: [facility];  Service: General;  Laterality: N/A;   VIDEO  BRONCHOSCOPY WITH ENDOBRONCHIAL NAVIGATION N/A 07/29/2020   Procedure: VIDEO BRONCHOSCOPY WITH ENDOBRONCHIAL NAVIGATION;  Surgeon: Tyler Pita, MD;  Location: [facility];  Service: Pulmonary;  Laterality: N/A;   VIDEO BRONCHOSCOPY WITH ENDOBRONCHIAL ULTRASOUND N/A 07/29/2020   Procedure: VIDEO BRONCHOSCOPY WITH ENDOBRONCHIAL ULTRASOUND;  Surgeon: Tyler Pita, MD;  Location: [facility];  Service: Pulmonary;  Laterality: N/A;   Family History  Problem Relation Age of Onset   Dementia Father    Melanoma Mother    Cancer Paternal Uncle        unknown   Cancer Paternal Uncle        unknown   Social History   Socioeconomic History   Marital status: Divorced    Spouse name: single   Number of  children: 0   Years of education: 12   Highest education level: High school graduate  Occupational History   Occupation: disability  Tobacco Use   Smoking status: Every Day    Packs/day: 1.25    Years: 30.00    Total pack years: 37.50    Types: Cigarettes   Smokeless tobacco: Never   Tobacco comments:    currently about 1/2 PPD--02/20/2022  Vaping Use   Vaping Use: Never used  Substance and Sexual Activity   Alcohol use: No   Drug use: No   Sexual activity: Never  Other Topics Concern   Not on file  Social History Narrative   Not on file   Social Determinants of Health   Financial Resource Strain: Low Risk  (05/28/2022)   Overall Financial Resource Strain (CARDIA)    Difficulty of Paying Living Expenses: Not hard at all  Food Insecurity: No Food Insecurity (05/27/2021)   Hunger Vital Sign    Worried About Running Out of Food in the Last Year: Never true    Ran Out of Food in the Last Year: Never true  Transportation Needs: No Transportation Needs (05/28/2022)   PRAPARE - Hydrologist (Medical): No    Lack of Transportation (Non-Medical): No  Physical Activity: Sufficiently Active (05/28/2022)   Exercise Vital Sign    Days of Exercise per Week: 3  days    Minutes of Exercise per Session: 60 min  Stress: No Stress Concern Present (05/28/2022)   [place]    Feeling of Stress : Only a little  Social Connections: Moderately Integrated (05/28/2022)   Social Connection and Isolation Panel [NHANES]    Frequency of Communication with Friends and Family: Three times a week    Frequency of Social Gatherings with Friends and Family: Once a week    Attends Religious Services: 1 to 4 times per year    Active Member of [employer] or Organizations: Yes    Attends Archivist Meetings: Never    Marital Status: Widowed    Tobacco Counseling Ready to quit: Not Answered Counseling given: Not Answered Tobacco comments: currently about 1/2 PPD--02/20/2022   Clinical Intake:  Pre-visit preparation completed: Yes  Pain : 0-10 Pain Score: 7  Pain Type: Neuropathic pain Pain Location: Back Pain Orientation: Lower Pain Descriptors / Indicators: Burning, Aching, Stabbing, Shooting Pain Onset: More than a month ago Pain Frequency: Constant Pain Relieving Factors: pain medication, heating pads Effect of Pain on Daily Activities: alright as long as pain is control  Pain Relieving Factors: pain medication, heating pads  BMI - recorded: 25.37 Nutritional Status: BMI 25 -29 Overweight Nutritional Risks: Nausea/ vomitting/ diarrhea (due to Fentanyl:has antiemetic medicine) Diabetes: No  Diabetic?NO Information entered by :: B.Janeice Stegall,LPN  Activities of Daily Living    05/28/2022    1:25 PM 02/25/2022    1:57 PM  In your present state of health, do you have any difficulty performing the following activities:  Hearing? 1 1  Vision? 0 1  Difficulty concentrating or making decisions? 0 1  Walking or climbing stairs? 0 0  Dressing or bathing? 0 0  Doing errands, shopping? 0 0  Preparing Food and eating ? N   Using the Toilet? N   In the past six months, have you  accidently leaked urine? N   Do you have problems with loss of bowel control? N   Managing your Medications? N   Managing  your Finances? N   Housekeeping or managing your Housekeeping? N     Patient Care Team: [name], MD as PCP - General (Family Medicine) Jerrol Banana., MD (Family Medicine) Lucilla Lame, MD as Consulting Physician (Gastroenterology) Margaretha Sheffield, MD as Referring Physician (Physical Medicine and Rehabilitation) System, Provider Not In [place], RN as Oncology Nurse Navigator  Indicate any recent Medical Services you may have received from other than [facility] providers in the past year (date may be approximate).     Assessment:   This is a routine wellness examination for [place].  Hearing/Vision screen Hearing Screening - Comments:: Hearing not great per pt: needs referral Vision Screening - Comments:: Wears glasses:adequate : [facility] Dr.[place]  Dietary issues and exercise activities discussed: Current Exercise Habits: Home exercise routine (pt walks 19mle 3 days a week when not in lots of pain..Marland Kitchen, Type of exercise: walking, Time (Minutes): 60, Frequency (Times/Week): 3, Weekly Exercise (Minutes/Week): 180, Intensity: Mild, Exercise limited by: neurologic condition(s)   Goals Addressed             This Visit's Progress    DIET - EAT MORE FRUITS AND VEGETABLES   On track    Quit Smoking   Not on track    Recommend to quit smoking as planned in Spring 2021.        Depression Screen    05/28/2022    1:14 PM 02/25/2022    1:56 PM 05/27/2021    1:09 PM 03/04/2021    2:57 PM 11/28/2020    8:29 AM 03/12/2020    3:10 PM 06/20/2019    2:23 PM  PHQ 2/9 Scores  PHQ - 2 Score 1 0 0 0 0 0 0  PHQ- 9 Score  4  0 3 0 0    Fall Risk    05/28/2022    1:09 PM 05/27/2021    1:12 PM 03/04/2021    2:56 PM 11/28/2020    8:30 AM 05/21/2020    3:36 PM  Fall Risk   Falls in the past year? 0 0 0 0 0  Number falls in past yr: 0 0 0 0  0  Injury with Fall? 0 0 0 0 0  Risk for fall due to : No Fall Risks No Fall Risks Impaired balance/gait No Fall Risks   Follow up  Falls evaluation completed Falls evaluation completed Falls evaluation completed     [place]  Any stairs in or around the home? No  If so, are there any without handrails? No  Home free of loose throw rugs in walkways, pet beds, electrical cords, etc? Yes  Adequate lighting in your home to reduce risk of falls? Yes   ASSISTIVE DEVICES UTILIZED TO PREVENT FALLS:  Life alert? No  Use of a cane, walker or w/c? No  Grab bars in the bathroom? Yes  Shower chair or bench in shower? No  Elevated toilet seat or a handicapped toilet? No   Immunizations Immunization History  Administered Date(s) Administered   Influenza,inj,Quad PF,6+ Mos 02/20/2015, 05/26/2016, 12/23/2016, 03/07/2018, 03/22/2019, 04/25/2020, 03/04/2021, 02/25/2022   PFIZER(Purple Top)SARS-COV-2 Vaccination 08/31/2019, 09/28/2019, 03/12/2020   PNEUMOCOCCAL CONJUGATE-20 02/25/2022   Pneumococcal Polysaccharide-23 05/26/2016   Td 08/17/2018   Tdap 07/31/2008    TDAP status: Up to date  Flu Vaccine status: Up to date  Pneumococcal vaccine status: Up to date  Covid-19 vaccine status: Completed vaccines  Qualifies for Shingles Vaccine? Yes   Zostavax  completed no  Shingrix Completed?: Yes  Screening Tests Health Maintenance  Topic Date Due   Zoster Vaccines- Shingrix (1 of 2) Never done   Lung Cancer Screening  11/12/2021   COVID-19 Vaccine (4 - 2023-24 season) 01/02/2022   Hepatitis C Screening  02/26/2023 (Originally 10/31/1973)   HIV Screening  02/26/2023 (Originally 10/30/1970)   Medicare Annual Wellness (AWV)  05/29/2023   COLONOSCOPY (Pts 45-31yr Insurance coverage will need to be confirmed)  02/10/2025   DTaP/Tdap/Td (3 - Td or Tdap) 08/16/2028   INFLUENZA VACCINE  Completed   HPV VACCINES  Aged Out    Health Maintenance  Health  Maintenance Due  Topic Date Due   Zoster Vaccines- Shingrix (1 of 2) Never done   Lung Cancer Screening  11/12/2021   COVID-19 Vaccine (4 - 2023-24 season) 01/02/2022    Colorectal cancer screening: Type of screening: Colonoscopy. Completed yes. Repeat every 10 years  Lung Cancer Screening: (Low Dose CT Chest recommended if Age 65-80years, 30 pack-year currently smoking OR have quit w/in 15years.) does qualify.   Lung Cancer Screening Referral: yes done 02/20/2022  Additional Screening:  Hepatitis C Screening: does not qualify; Completed no  Vision Screening: Recommended annual ophthalmology exams for early detection of glaucoma and other disorders of the eye. Is the patient up to date with their annual eye exam?  Yes  Who is the provider or what is the name of the office in which the patient attends annual [facility] exams[facility] ChimesIf pt is not established with a provider, would they like to be referred to a provider to establish care? No .   Dental Screening: Recommended annual dental exams for proper oral hygiene:has dentures-no problems  Community Resource Referral / Chronic Care Management: CRR required this visit?  No   CCM required this visit?  No   Plan:     I have personally reviewed and noted the following in the patient's chart:   Medical and social history Use of alcohol, tobacco or illicit drugs  Current medications and supplements including opioid prescriptions. Patient is currently taking opioid prescriptions. Information provided to patient regarding non-opioid alternatives. Patient advised to discuss non-opioid treatment plan with their provider. Functional ability and status Nutritional status Physical activity Advanced directives List of other physicians Hospitalizations, surgeries, and ER visits in previous 12 months Vitals Screenings to include cognitive, depression, and falls Referrals and appointments  In addition, I have reviewed and  discussed with patient certain preventive protocols, quality metrics, and best practice recommendations. A written personalized care plan for preventive services as well as general preventive health recommendations were provided to patient.     BRoger Shelter LPN   19/39/0300  Nurse Notes: Pt states he does not hear well and probable needs hearing aid.States he has not had a hearing test.note to provider for request. Pt had had Lung cancer screening referral placed on 02/20/22. He states he has not been scheduled. [facility] information placed in AVS for pt to call and make appt. Pt manages pain by opioid [id number] and warnings placed in AVS.

## 2022-05-28 NOTE — Patient Instructions (Addendum)
Mr. Joshua Guerrero , Thank you for taking time to come for your Medicare Wellness Visit. I appreciate your ongoing commitment to your health goals. Please review the following plan we discussed and let me know if I can assist you in the future.   These are the goals we discussed:  Goals      DIET - EAT MORE FRUITS AND VEGETABLES     Quit Smoking     Recommend to quit smoking as planned in Spring 2021.         This is a list of the screening recommended for you and due dates:  Health Maintenance  Topic Date Due   Zoster (Shingles) Vaccine (1 of 2) Never done   Screening for Lung Cancer  11/12/2021   COVID-19 Vaccine (4 - 2023-24 season) 01/02/2022   Hepatitis C Screening: USPSTF Recommendation to screen - Ages 18-79 yo.  02/26/2023*   HIV Screening  02/26/2023*   Medicare Annual Wellness Visit  05/29/2023   Colon Cancer Screening  02/10/2025   DTaP/Tdap/Td vaccine (3 - Td or Tdap) 08/16/2028   Flu Shot  Completed   HPV Vaccine  Aged Out  *Topic was postponed. The date shown is not the original due date.    Advanced directives: no  Conditions/risks identified: none  Next appointment: Follow up in one year for your annual wellness visit 06/01/2023 '@2'$ :30pm telephone  Preventive Care 40-64 Years, Male Preventive care refers to lifestyle choices and visits with your health care provider that can promote health and wellness. What does preventive care include? A yearly physical exam. This is also called an annual well check. Dental exams once or twice a year. Routine eye exams. Ask your health care provider how often you should have your eyes checked. Personal lifestyle choices, including: Daily care of your teeth and gums. Regular physical activity. Eating a healthy diet. Avoiding tobacco and drug use. Limiting alcohol use. Practicing safe sex. Taking low-dose aspirin every day starting at age 51. What happens during an annual well check? The services and screenings done by your  health care provider during your annual well check will depend on your age, overall health, lifestyle risk factors, and family history of disease. Counseling  Your health care provider may ask you questions about your: Alcohol use. Tobacco use. Drug use. Emotional well-being. Home and relationship well-being. Sexual activity. Eating habits. Work and work Statistician. Screening  You may have the following tests or measurements: Height, weight, and BMI. Blood pressure. Lipid and cholesterol levels. These may be checked every 5 years, or more frequently if you are over 7 years old. Skin check. Lung cancer screening. You may have this screening every year starting at age 36 if you have a 30-pack-year history of smoking and currently smoke or have quit within the past 15 years. Fecal occult blood test (FOBT) of the stool. You may have this test every year starting at age 42. Flexible sigmoidoscopy or colonoscopy. You may have a sigmoidoscopy every 5 years or a colonoscopy every 10 years starting at age 55. Prostate cancer screening. Recommendations will vary depending on your family history and other risks. Hepatitis C blood test. Hepatitis B blood test. Sexually transmitted disease (STD) testing. Diabetes screening. This is done by checking your blood sugar (glucose) after you have not eaten for a while (fasting). You may have this done every 1-3 years. Discuss your test results, treatment options, and if necessary, the need for more tests with your health care provider. Vaccines  Your  health care provider may recommend certain vaccines, such as: Influenza vaccine. This is recommended every year. Tetanus, diphtheria, and acellular pertussis (Tdap, Td) vaccine. You may need a Td booster every 10 years. Zoster vaccine. You may need this after age 53. Pneumococcal 13-valent conjugate (PCV13) vaccine. You may need this if you have certain conditions and have not been vaccinated. Pneumococcal  polysaccharide (PPSV23) vaccine. You may need one or two doses if you smoke cigarettes or if you have certain conditions. Talk to your health care provider about which screenings and vaccines you need and how often you need them. This information is not intended to replace advice given to you by your health care provider. Make sure you discuss any questions you have with your health care provider. Document Released: 05/17/2015 Document Revised: 01/08/2016 Document Reviewed: 02/19/2015 Elsevier Interactive Patient Education  2017 [place] Prevention in the Home Falls can cause injuries. They can happen to people of all ages. There are many things you can do to make your home safe and to help prevent falls. What can I do on the outside of my home? Regularly fix the edges of walkways and driveways and fix any cracks. Remove anything that might make you trip as you walk through a door, such as a raised step or threshold. Trim any bushes or trees on the path to your home. Use bright outdoor lighting. Clear any walking paths of anything that might make someone trip, such as rocks or tools. Regularly check to see if handrails are loose or broken. Make sure that both sides of any steps have handrails. Any raised decks and porches should have guardrails on the edges. Have any leaves, snow, or ice cleared regularly. Use sand or salt on walking paths during winter. Clean up any spills in your garage right away. This includes oil or grease spills. What can I do in the bathroom? Use night lights. Install grab bars by the toilet and in the tub and shower. Do not use towel bars as grab bars. Use non-skid mats or decals in the tub or shower. If you need to sit down in the shower, use a plastic, non-slip stool. Keep the floor dry. Clean up any water that spills on the floor as soon as it happens. Remove soap buildup in the tub or shower regularly. Attach bath mats securely with double-sided  non-slip rug tape. Do not have throw rugs and other things on the floor that can make you trip. What can I do in the bedroom? Use night lights. Make sure that you have a light by your bed that is easy to reach. Do not use any sheets or blankets that are too big for your bed. They should not hang down onto the floor. Have a firm chair that has side arms. You can use this for support while you get dressed. Do not have throw rugs and other things on the floor that can make you trip. What can I do in the kitchen? Clean up any spills right away. Avoid walking on wet floors. Keep items that you use a lot in easy-to-reach places. If you need to reach something above you, use a strong step stool that has a grab bar. Keep electrical cords out of the way. Do not use floor polish or wax that makes floors slippery. If you must use wax, use non-skid floor wax. Do not have throw rugs and other things on the floor that can make you trip. What can I do  with my stairs? Do not leave any items on the stairs. Make sure that there are handrails on both sides of the stairs and use them. Fix handrails that are broken or loose. Make sure that handrails are as long as the stairways. Check any carpeting to make sure that it is firmly attached to the stairs. Fix any carpet that is loose or worn. Avoid having throw rugs at the top or bottom of the stairs. If you do have throw rugs, attach them to the floor with carpet tape. Make sure that you have a light switch at the top of the stairs and the bottom of the stairs. If you do not have them, ask someone to add them for you. What else can I do to help prevent falls? Wear shoes that: Do not have high heels. Have rubber bottoms. Are comfortable and fit you well. Are closed at the toe. Do not wear sandals. If you use a stepladder: Make sure that it is fully opened. Do not climb a closed stepladder. Make sure that both sides of the stepladder are locked into place. Ask  someone to hold it for you, if possible. Clearly mark and make sure that you can see: Any grab bars or handrails. First and last steps. Where the edge of each step is. Use tools that help you move around (mobility aids) if they are needed. These include: Canes. Walkers. Scooters. Crutches. Turn on the lights when you go into a dark area. Replace any light bulbs as soon as they burn out. Set up your furniture so you have a clear path. Avoid moving your furniture around. If any of your floors are uneven, fix them. If there are any pets around you, be aware of where they are. Review your medicines with your doctor. Some medicines can make you feel dizzy. This can increase your chance of falling. Ask your doctor what other things that you can do to help prevent falls. This information is not intended to replace advice given to you by your health care provider. Make sure you discuss any questions you have with your health care provider. Document Released: 02/14/2009 Document Revised: 09/26/2015 Document Reviewed: 05/25/2014 Elsevier Interactive Patient Education  2017 [place] to call [facility] for appointment (Lung Ca screening) [phone]

## 2022-05-28 NOTE — Progress Notes (Signed)
Referral for ENT to further assess decreased hearing   Joshua Foster, MD  [facility]

## 2022-05-29 ENCOUNTER — Encounter: Payer: Self-pay | Admitting: Family Medicine

## 2022-05-29 ENCOUNTER — Ambulatory Visit (INDEPENDENT_AMBULATORY_CARE_PROVIDER_SITE_OTHER): Payer: Medicare Other | Admitting: Family Medicine

## 2022-05-29 VITALS — BP 145/77 | HR 58 | Resp 16 | Wt 161.0 lb

## 2022-05-29 DIAGNOSIS — F330 Major depressive disorder, recurrent, mild: Secondary | ICD-10-CM

## 2022-05-29 DIAGNOSIS — R110 Nausea: Secondary | ICD-10-CM

## 2022-05-29 DIAGNOSIS — F32A Depression, unspecified: Secondary | ICD-10-CM

## 2022-05-29 MED ORDER — PROCHLORPERAZINE MALEATE 10 MG PO TABS: 10.0000 mg | ORAL_TABLET | Freq: Three times a day (TID) | ORAL | 1 refills | Status: DC | PRN

## 2022-05-29 MED ORDER — DESVENLAFAXINE SUCCINATE ER 100 MG PO TB24: 100.0000 mg | ORAL_TABLET | Freq: Every day | ORAL | 1 refills | Status: DC

## 2022-05-29 MED ORDER — PROMETHAZINE HCL 25 MG PO TABS: 25.0000 mg | ORAL_TABLET | Freq: Three times a day (TID) | ORAL | 1 refills | Status: DC | PRN

## 2022-05-29 NOTE — Progress Notes (Signed)
I,[name],acting as a scribe for [employer], MD.,have documented all relevant documentation on the behalf of [name], MD,as directed by  [name], MD while in the presence of [name], MD.   Established patient visit   Patient: Joshua Guerrero   DOB: Oct 15, 1955   67 y.o. Male  MRN: [id number] Visit Date: 05/29/2022  Today's healthcare provider: [name], MD   Chief Complaint  Patient presents with   Follow-up   Subjective    HPI  Patient here  for medication refills. Reports that he will be transferring to Dr. Rosanna Randy at end of February 2024.  Nausea: On Prochlorperazine and Promethazine. Reports stable on medication. Denies symptoms at this time.   Depression. Stable on Pristiq. Denies symptoms of frequent crying or decreased energy on this medication. Relies on faith to help with mood. Reports feeling well today.      05/29/2022    1:34 PM  Depression screen PHQ 2/9  Decreased Interest 0  Down, Depressed, Hopeless 0  PHQ - 2 Score 0  Altered sleeping 0  Tired, decreased energy 0  Change in appetite 0  Feeling bad or failure about yourself  0  Trouble concentrating 0  Moving slowly or fidgety/restless 0  Suicidal thoughts 0  PHQ-9 Score 0  Difficult doing work/chores Not difficult at all     Medications: Outpatient Medications Prior to Visit  Medication Sig   albuterol (VENTOLIN HFA) 108 (90 Base) MCG/ACT inhaler INHALE 2 PUFFS BY MOUTH INTO THE LUNGS EVERY 6 HOURS AS NEEDED FOR WHEEZING OR SHORTNESS OF BREATH.   baclofen (LIORESAL) 10 MG tablet Take 10 mg by mouth 4 (four) times daily. STARTING AT LUNCHTIME   fentaNYL (DURAGESIC - DOSED MCG/HR) 25 MCG/HR patch Place 1 patch onto the skin every 3 (three) days.   hydrochlorothiazide (HYDRODIURIL) 25 MG tablet Take 1 tablet (25 mg total) by mouth daily.   hydrOXYzine (ATARAX) 50 MG tablet TAKE 1 TABLET BY MOUTH 3 TIMES DAILY AS  NEEDED (USE AS NEEDED FOR SLEEP)   lansoprazole (PREVACID) 30 MG capsule TAKE ONE CAPSULE BY MOUTH TWICE A DAY BEFORE A MEAL   meloxicam (MOBIC) 15 MG tablet Take 1 tablet (15 mg total) by mouth daily as needed for pain.   metoprolol succinate (TOPROL-XL) 50 MG 24 hr tablet Take with or immediately following a meal.   oxyCODONE-acetaminophen (PERCOCET) 10-325 MG tablet Take 1 tablet by mouth every 4 (four) hours. 3AM, 7AM, 11AM, 3 PM, 7PM AND 11PM   sucralfate (CARAFATE) 1 g tablet TAKE 1 TABLET BY MOUTH 4 TIMES A DAY WITH MEALS AND AT BEDTIME   topiramate (TOPAMAX) 50 MG tablet Take 50 mg by mouth 2 (two) times daily.   TRELEGY ELLIPTA 100-62.5-25 MCG/ACT AEPB INHALE 1 PUFF BY MOUTH INTO THE LUNGS DAILY.   [DISCONTINUED] desvenlafaxine (PRISTIQ) 100 MG 24 hr tablet Take 1 tablet (100 mg total) by mouth daily.   [DISCONTINUED] prochlorperazine (COMPAZINE) 10 MG tablet Take 1 tablet (10 mg total) by mouth every 8 (eight) hours as needed.   [DISCONTINUED] promethazine (PHENERGAN) 25 MG tablet Take 1 tablet (25 mg total) by mouth every 8 (eight) hours as needed.   buprenorphine (BUTRANS) 5 MCG/HR PTWK 1 patch once a week. (Patient not taking: Reported on 05/28/2022)   methylPREDNISolone (MEDROL DOSEPAK) 4 MG TBPK tablet Take as directed in the package. (Patient not taking: Reported on 05/28/2022)   morphine (MS CONTIN) 15 MG 12 hr tablet Take 1 tablet  by mouth as needed. (Patient not taking: Reported on 05/28/2022)   No facility-administered medications prior to visit.    Review of Systems     Objective    BP (!) 145/77 (BP Location: Right Arm, Patient Position: Sitting, Cuff Size: Normal)   Pulse (!) 58   Resp 16   Wt 161 lb (73 kg)   BMI 25.22 kg/m    Physical Exam Vitals reviewed.  Constitutional:      General: He is not in acute distress.    Appearance: Normal appearance. He is normal weight. He is not ill-appearing, toxic-appearing or diaphoretic.     Comments: Well groomed, calmly  sitting in exam rooom  Eyes:     Conjunctiva/sclera: Conjunctivae normal.  Cardiovascular:     Rate and Rhythm: Normal rate and regular rhythm.     Pulses: Normal pulses.     Heart sounds: Normal heart sounds. No murmur heard.    No friction rub. No gallop.  Pulmonary:     Effort: Pulmonary effort is normal. No respiratory distress.     Breath sounds: Normal breath sounds. No stridor. No wheezing, rhonchi or rales.  Abdominal:     General: Bowel sounds are normal. There is no distension.     Palpations: Abdomen is soft.     Tenderness: There is no abdominal tenderness.  Musculoskeletal:     Right lower leg: No edema.     Left lower leg: No edema.  Skin:    Findings: No erythema or rash.  Neurological:     Mental Status: He is alert and oriented to person, place, and time.  Psychiatric:        Attention and Perception: Attention and perception normal. He is attentive. He does not perceive auditory or visual hallucinations.        Mood and Affect: Mood and affect normal.        Speech: Speech normal.        Behavior: Behavior normal. Behavior is cooperative.        Thought Content: Thought content normal. Thought content is not paranoid or delusional. Thought content does not include homicidal or suicidal ideation. Thought content does not include homicidal or suicidal plan.        Judgment: Judgment normal.      No results found for any visits on 05/29/22.  Assessment & Plan     Problem List Items Addressed This Visit       Other   Clinical depression   Relevant Medications   desvenlafaxine (PRISTIQ) 100 MG 24 hr tablet   Chronic nausea - Primary    Chronic  Symptoms well controlled on promethazine '25mg'$  q8 PRN and compazine '10mg'$  q 8 hrs PRN  Refills provided        Relevant Medications   promethazine (PHENERGAN) 25 MG tablet   prochlorperazine (COMPAZINE) 10 MG tablet   Mild episode of recurrent major depressive disorder (HCC)    Chronic  Symptoms stable   Continue current treatment plan including pristiq '100mg'$  daily       Relevant Medications   desvenlafaxine (PRISTIQ) 100 MG 24 hr tablet     The entirety of the information documented in the History of Present Illness, Review of Systems and Physical Exam were personally obtained by me. Portions of this information were initially documented by Lyndel Pleasure, CMA And reviewed by me for thoroughness and accuracy.[name], MD    [name], MD  [facility] [phone] (phone) [phone] (fax)  [facility]  

## 2022-05-29 NOTE — Assessment & Plan Note (Signed)
Chronic  Symptoms well controlled on promethazine '25mg'$  q8 PRN and compazine '10mg'$  q 8 hrs PRN  Refills provided

## 2022-05-29 NOTE — Assessment & Plan Note (Signed)
Chronic  Symptoms stable  Continue current treatment plan including pristiq '100mg'$  daily

## 2022-06-11 ENCOUNTER — Other Ambulatory Visit: Payer: Self-pay | Admitting: Physical Medicine and Rehabilitation

## 2022-06-11 DIAGNOSIS — M5416 Radiculopathy, lumbar region: Secondary | ICD-10-CM

## 2022-06-12 ENCOUNTER — Telehealth: Payer: Self-pay | Admitting: Family Medicine

## 2022-06-12 NOTE — Telephone Encounter (Signed)
Patient has appointment coming up at [facility] with Dr. Rosanna Randy.  Please Review

## 2022-06-12 NOTE — Telephone Encounter (Signed)
[place] faxed refill request for the following medications:   meloxicam (MOBIC) 15 MG tablet    Please advise.

## 2022-06-16 ENCOUNTER — Telehealth: Payer: Self-pay | Admitting: Family Medicine

## 2022-06-16 NOTE — Telephone Encounter (Signed)
[place] faxed refill request for the following medications:   meloxicam (MOBIC) 15 MG tablet     Please advise

## 2022-06-16 NOTE — Telephone Encounter (Signed)
Patient's PCP at [facility] clinic in [place], [place]  [facility]

## 2022-06-17 ENCOUNTER — Ambulatory Visit: Payer: Self-pay

## 2022-06-17 NOTE — Telephone Encounter (Signed)
  Chief Complaint: leg swelling Symptoms: bilat leg swelling from feet to calf Frequency: 1 week Pertinent Negatives: Patient denies pain or redness Disposition: '[]'$ ED /'[]'$ Urgent Care (no appt availability in office) / '[]'$ Appointment(In office/virtual)/ '[]'$  [place] [facility]/ '[]'$ Home Care/ '[]'$ Refused Recommended Disposition /'[]'$ [place] [facility]/ '[]'$  Follow-up with PCP Additional Notes: pt states he has decreased taking Topamax for HA because it has been causing weight loss and pt has had leg swelling that has gotten worse since tapering dose. Pt scheduled OV for 06/18/22 at 1320 with PCP.   Summary: swelling of feet   Pt has swelling of both feet since tapering off of topiramate (TOPAMAX) 50 MG tablet / please advise         Reason for Disposition  [1] MODERATE leg swelling (e.g., swelling extends up to knees) AND [2] new-onset or worsening  Answer Assessment - Initial Assessment Questions 1. ONSET: "When did the swelling start?" (e.g., minutes, hours, days)     1 WEEK  2. LOCATION: "What part of the leg is swollen?"  "Are both legs swollen or just one leg?"     Bilat feet up to calf  3. SEVERITY: "How bad is the swelling?" (e.g., localized; mild, moderate, severe)   - Localized: Small area of swelling localized to one leg.   - MILD pedal edema: Swelling limited to foot and ankle, pitting edema < 1/4 inch (6 mm) deep, rest and elevation eliminate most or all swelling.   - MODERATE edema: Swelling of lower leg to knee, pitting edema > 1/4 inch (6 mm) deep, rest and elevation only partially reduce swelling.   - SEVERE edema: Swelling extends above knee, facial or hand swelling present.      Moderate  5. PAIN: "Is the swelling painful to touch?" If Yes, ask: "How painful is it?"   (Scale 1-10; mild, moderate or severe)     no 7. CAUSE: "What do you think is causing the leg swelling?"     Tapering down on topamax  Protocols used: Leg Swelling and Edema-A-AH

## 2022-06-17 NOTE — Progress Notes (Signed)
I,Joshua Guerrero,acting as a scribe for [employer], MD.,have documented all relevant documentation on the behalf of Joshua Foster, MD,as directed by  Joshua Foster, MD while in the presence of Joshua Foster, MD.   Established patient visit   Patient: Joshua Guerrero   DOB: May 01, 1956   67 y.o. Male  MRN: [id number] Visit Date: 06/18/2022  Today's healthcare provider: [name], MD   Chief Complaint  Patient presents with   Edema   Subjective    HPI  Edema: Patient complains of edema. The location of the edema bilateral leg swelling from feet to calf .  The edema has been moderate.  Onset of symptoms was 1 week ago, better today since that time. The edema is present all day. The patient states has had minor episodes before.  The swelling has been aggravated by nothing. Cardiac risk factors include advanced age (older than 14 for men, 70 for women), dyslipidemia, hypertension, male gender, sedentary lifestyle, and smoking/ tobacco exposure. Patient states he has decreased taking Topamax for HA because it has been causing weight loss and patient has had leg swelling that has gotten worse since tapering dose. Patient was started on Prednisone but didn't take it yesterday or today. Reports that overall they look better. He reports having sodium in his diet regularly  HTN: patient noted to have elevated blood pressure, reports regular adherence to HCTZ and metoprolol, denies current HA or blurred vision, states his home blood pressures range into low 0000000 systolic, emphasizes increased amounts of salt in his diet   Wt Readings from Last 3 Encounters:  06/18/22 164 lb (74.4 kg)  05/29/22 161 lb (73 kg)  05/28/22 162 lb (73.5 kg)     Medications: Outpatient Medications Prior to Visit  Medication Sig   baclofen (LIORESAL) 10 MG tablet Take 10 mg by mouth 4 (four) times daily. STARTING AT LUNCHTIME   desvenlafaxine (PRISTIQ)  100 MG 24 hr tablet Take 1 tablet (100 mg total) by mouth daily.   fentaNYL (DURAGESIC - DOSED MCG/HR) 25 MCG/HR patch Place 1 patch onto the skin every 3 (three) days.   hydrochlorothiazide (HYDRODIURIL) 25 MG tablet Take 1 tablet (25 mg total) by mouth daily.   hydrOXYzine (ATARAX) 50 MG tablet TAKE 1 TABLET BY MOUTH 3 TIMES DAILY AS NEEDED (USE AS NEEDED FOR SLEEP)   lansoprazole (PREVACID) 30 MG capsule TAKE ONE CAPSULE BY MOUTH TWICE A DAY BEFORE A MEAL   meloxicam (MOBIC) 15 MG tablet Take 1 tablet (15 mg total) by mouth daily as needed for pain.   methylPREDNISolone (MEDROL DOSEPAK) 4 MG TBPK tablet Take as directed in the package.   metoprolol succinate (TOPROL-XL) 50 MG 24 hr tablet Take with or immediately following a meal.   morphine (MS CONTIN) 15 MG 12 hr tablet Take 1 tablet by mouth as needed.   oxyCODONE-acetaminophen (PERCOCET) 10-325 MG tablet Take 1 tablet by mouth every 4 (four) hours. 3AM, 7AM, 11AM, 3 PM, 7PM AND 11PM   prochlorperazine (COMPAZINE) 10 MG tablet Take 1 tablet (10 mg total) by mouth every 8 (eight) hours as needed.   promethazine (PHENERGAN) 25 MG tablet Take 1 tablet (25 mg total) by mouth every 8 (eight) hours as needed.   sucralfate (CARAFATE) 1 g tablet TAKE 1 TABLET BY MOUTH 4 TIMES A DAY WITH MEALS AND AT BEDTIME   topiramate (TOPAMAX) 50 MG tablet Take 50 mg by mouth 2 (two) times daily.   TRELEGY ELLIPTA 100-62.5-25 MCG/ACT AEPB  INHALE 1 PUFF BY MOUTH INTO THE LUNGS DAILY.   [DISCONTINUED] albuterol (VENTOLIN HFA) 108 (90 Base) MCG/ACT inhaler INHALE 2 PUFFS BY MOUTH INTO THE LUNGS EVERY 6 HOURS AS NEEDED FOR WHEEZING OR SHORTNESS OF BREATH.   [DISCONTINUED] buprenorphine (BUTRANS) 5 MCG/HR PTWK 1 patch once a week.   No facility-administered medications prior to visit.    Review of Systems  Respiratory:  Negative for shortness of breath.   Gastrointestinal:  Negative for abdominal distention.  Genitourinary:  Negative for decreased urine volume  and dysuria.  Hematological:  Bruises/bleeds easily.       Objective    BP (!) 167/83   Pulse 67   Resp 16   Wt 164 lb (74.4 kg)   SpO2 96%   BMI 25.69 kg/m    Physical Exam Vitals reviewed.  Constitutional:      General: He is not in acute distress.    Appearance: Normal appearance. He is not ill-appearing, toxic-appearing or diaphoretic.  Eyes:     Conjunctiva/sclera: Conjunctivae normal.  Pulmonary:     Effort: Pulmonary effort is normal. No respiratory distress.     Breath sounds: Normal breath sounds. No stridor. No wheezing, rhonchi or rales.  Abdominal:     General: Bowel sounds are normal. There is no distension.     Palpations: Abdomen is soft.     Tenderness: There is no abdominal tenderness.  Musculoskeletal:     Right lower leg: 2+ Pitting Edema present.     Left lower leg: 2+ Pitting Edema present.  Skin:    Findings: No erythema or rash.  Neurological:     Mental Status: He is alert and oriented to person, place, and time.       Results for orders placed or performed in visit on 06/18/22  CBC  Result Value Ref Range   WBC 8.1 3.4 - 10.8 x10E3/uL   RBC 5.00 4.14 - 5.80 x10E6/uL   Hemoglobin 15.0 13.0 - 17.7 g/dL   Hematocrit 45.9 37.5 - 51.0 %   MCV 92 79 - 97 fL   MCH 30.0 26.6 - 33.0 pg   MCHC 32.7 31.5 - 35.7 g/dL   RDW 13.7 11.6 - 15.4 %   Platelets 301 150 - 450 x10E3/uL  INR/PT  Result Value Ref Range   INR 1.0 0.9 - 1.2   Prothrombin Time 10.9 9.1 - 12.0 sec  Comprehensive metabolic panel  Result Value Ref Range   Glucose 108 (H) 70 - 99 mg/dL   BUN 11 8 - 27 mg/dL   Creatinine, Ser 0.73 (L) 0.76 - 1.27 mg/dL   eGFR 102 >59 mL/min/1.73   BUN/Creatinine Ratio 15 10 - 24   Sodium 138 134 - 144 mmol/L   Potassium 3.9 3.5 - 5.2 mmol/L   Chloride 100 96 - 106 mmol/L   CO2 24 20 - 29 mmol/L   Calcium 8.9 8.6 - 10.2 mg/dL   Total Protein 5.3 (L) 6.0 - 8.5 g/dL   Albumin 3.9 3.9 - 4.9 g/dL   Globulin, Total 1.4 (L) 1.5 - 4.5 g/dL    Albumin/Globulin Ratio 2.8 (H) 1.2 - 2.2   Bilirubin Total <0.2 0.0 - 1.2 mg/dL   Alkaline Phosphatase 76 44 - 121 IU/L   AST 24 0 - 40 IU/L   ALT 21 0 - 44 IU/L    Assessment & Plan     Problem List Items Addressed This Visit       Cardiovascular and Mediastinum  BP (high blood pressure)    Chronic  Uncontrolled, BP elevated on two measurements  Patient currently on steroid course for back pain  Recommended holding off on changing blood pressure agents until he completes steroids in 4 days  He will record BP 1 hour after medications for the next two weeks and bring to review at follow up appt  Will collect CMP today for leg swelling         Other   Bilateral leg edema - Primary    New problem  Patient is stable  BP is elevated  Discussed dietary recommendations including limiting salt in his diet  Will collect CMP to evaluate for any electrolyte derangements, CBC to evaluate for anemia, INR and PT to assess for report of abnormal bruising that may indicate any hepatic malfunction in addition to CMP  F/u in 2 weeks      Relevant Orders   Comprehensive metabolic panel   CBC (Completed)   INR/PT (Completed)     Return in about 2 weeks (around 07/02/2022) for leg swelling  & HTN .        The entirety of the information documented in the History of Present Illness, Review of Systems and Physical Exam were personally obtained by me. Portions of this information were initially documented by Lyndel Pleasure, CMA and reviewed by me for thoroughness and accuracy.Joshua Foster, MD     Joshua Foster, MD  [facility] [phone] (phone) [phone] (fax)  [place]

## 2022-06-18 ENCOUNTER — Encounter: Payer: Self-pay | Admitting: Family Medicine

## 2022-06-18 ENCOUNTER — Ambulatory Visit (INDEPENDENT_AMBULATORY_CARE_PROVIDER_SITE_OTHER): Payer: Medicare Other | Admitting: Family Medicine

## 2022-06-18 VITALS — BP 167/83 | HR 67 | Resp 16 | Wt 164.0 lb

## 2022-06-18 DIAGNOSIS — R600 Localized edema: Secondary | ICD-10-CM | POA: Diagnosis not present

## 2022-06-18 DIAGNOSIS — I10 Essential (primary) hypertension: Secondary | ICD-10-CM | POA: Diagnosis not present

## 2022-06-18 NOTE — Assessment & Plan Note (Signed)
Chronic  Uncontrolled, BP elevated on two measurements  Patient currently on steroid course for back pain  Recommended holding off on changing blood pressure agents until he completes steroids in 4 days  He will record BP 1 hour after medications for the next two weeks and bring to review at follow up appt  Will collect CMP today for leg swelling

## 2022-06-18 NOTE — Patient Instructions (Signed)
It was a pleasure to see you today!  Thank you for choosing [facility] for your primary care.   Joshua Guerrero was seen for leg swelling.   Our plans for today were: We will test the blood to look for abnormalities with your liver or kidney function that may be causing the swelling.  In the meantime I recommend decreasing the amount of salt in your diet as this can contribute to leg swelling.  Please seek care immediately if you begin to have trouble breathing or notice the swelling is worse.  Please keep your legs elevated above the level of your heart to help reduce the swelling.  We will follow up with results of labs once they are available.     You should return to our clinic in 2 weeks for leg swelling   Best Wishes,   Dr. Quentin Cornwall

## 2022-06-19 LAB — COMPREHENSIVE METABOLIC PANEL
ALT: 21 IU/L (ref 0–44)
AST: 24 IU/L (ref 0–40)
Albumin/Globulin Ratio: 2.8 — ABNORMAL HIGH (ref 1.2–2.2)
Albumin: 3.9 g/dL (ref 3.9–4.9)
Alkaline Phosphatase: 76 IU/L (ref 44–121)
BUN/Creatinine Ratio: 15 (ref 10–24)
BUN: 11 mg/dL (ref 8–27)
Bilirubin Total: <0.2 mg/dL (ref 0.0–1.2)
CO2: 24 mmol/L (ref 20–29)
Calcium: 8.9 mg/dL (ref 8.6–10.2)
Chloride: 100 mmol/L (ref 96–106)
Creatinine, Ser: 0.73 mg/dL — ABNORMAL LOW (ref 0.76–1.27)
Globulin, Total: 1.4 g/dL — ABNORMAL LOW (ref 1.5–4.5)
Glucose: 108 mg/dL — ABNORMAL HIGH (ref 70–99)
Potassium: 3.9 mmol/L (ref 3.5–5.2)
Sodium: 138 mmol/L (ref 134–144)
Total Protein: 5.3 g/dL — ABNORMAL LOW (ref 6.0–8.5)
eGFR: 102 mL/min/{1.73_m2} (ref >59)

## 2022-06-19 LAB — CBC
Hematocrit: 45.9 % (ref 37.5–51.0)
Hemoglobin: 15 g/dL (ref 13.0–17.7)
MCH: 30 pg (ref 26.6–33.0)
MCHC: 32.7 g/dL (ref 31.5–35.7)
MCV: 92 fL (ref 79–97)
Platelets: 301 10*3/uL (ref 150–450)
RBC: 5 x10E6/uL (ref 4.14–5.80)
RDW: 13.7 % (ref 11.6–15.4)
WBC: 8.1 10*3/uL (ref 3.4–10.8)

## 2022-06-19 LAB — PROTIME-INR
INR: 1 (ref 0.9–1.2)
Prothrombin Time: 10.9 s (ref 9.1–12.0)

## 2022-06-22 NOTE — Assessment & Plan Note (Signed)
New problem  Patient is stable  BP is elevated  Discussed dietary recommendations including limiting salt in his diet  Will collect CMP to evaluate for any electrolyte derangements, CBC to evaluate for anemia, INR and PT to assess for report of abnormal bruising that may indicate any hepatic malfunction in addition to CMP  F/u in 2 weeks

## 2022-07-02 ENCOUNTER — Other Ambulatory Visit: Payer: Self-pay | Admitting: Family Medicine

## 2022-07-02 ENCOUNTER — Ambulatory Visit: Payer: Medicare Other | Admitting: Family Medicine

## 2022-07-02 DIAGNOSIS — G4733 Obstructive sleep apnea (adult) (pediatric): Secondary | ICD-10-CM

## 2022-07-02 NOTE — Telephone Encounter (Signed)
Medication Refill - Medication: hydrOXYzine (ATARAX) 50 MG tablet   Has the patient contacted their pharmacy? yes (Agent: If no, request that the patient contact the pharmacy for the refill. If patient does not wish to contact the pharmacy document the reason why and proceed with request.) (Agent: If yes, when and what did the pharmacy advise?)contact pcp  Preferred Pharmacy (with phone number or street name): [facility], [place] Phone: [phone]  Fax: [phone]   Has the patient been seen for an appointment in the last year OR does the patient have an upcoming appointment? yes  Agent: Please be advised that RX refills may take up to 3 business days. We ask that you follow-up with your pharmacy.

## 2022-07-03 MED ORDER — HYDROXYZINE HCL 50 MG PO TABS: ORAL_TABLET | ORAL | 0 refills | Status: DC

## 2022-07-03 NOTE — Telephone Encounter (Signed)
Requested Prescriptions  Pending Prescriptions Disp Refills   hydrOXYzine (ATARAX) 50 MG tablet 270 tablet 0    Sig: TAKE 1 TABLET BY MOUTH 3 TIMES DAILY AS NEEDED (USE AS NEEDED FOR SLEEP)     Ear, Nose, and Throat:  Antihistamines 2 Failed - 07/02/2022  4:16 PM      Failed - Cr in normal range and within 360 days    Creatinine  Date Value Ref Range Status  12/12/2011 1.09 0.60 - 1.30 mg/dL Final   Creatinine, Ser  Date Value Ref Range Status  06/18/2022 0.73 (L) 0.76 - 1.27 mg/dL Final         Passed - Valid encounter within last 12 months    Recent Outpatient Visits           2 weeks ago Bilateral leg edema   [place] Simmons-Robinson, [place], MD   1 month ago Chronic nausea   [place], [place], MD   4 months ago Encounter for annual physical exam   [place], [place], MD   6 months ago Continuous tobacco abuse   [place] Eulas Post, MD   11 months ago Chronic neck and back pain   [place] Eulas Post, MD       Future Appointments             In 6 days Simmons-Robinson, Riki Sheer, MD [facility], [place]

## 2022-07-06 ENCOUNTER — Other Ambulatory Visit: Payer: Self-pay | Admitting: Family Medicine

## 2022-07-06 DIAGNOSIS — G4733 Obstructive sleep apnea (adult) (pediatric): Secondary | ICD-10-CM

## 2022-07-06 MED ORDER — HYDROXYZINE HCL 50 MG PO TABS: ORAL_TABLET | ORAL | 0 refills | Status: DC

## 2022-07-06 NOTE — Telephone Encounter (Signed)
Copied from [place] [id number]. Topic: General - Other >> Jul 03, 2022 11:11 AM Everette C wrote: Reason for CRM: The patient has called to follow up on their previous request for hydrOXYzine (ATARAX) 50 MG tablet [id number]  Please contact the patient further when possible

## 2022-07-06 NOTE — Telephone Encounter (Signed)
Patient has an upcoming appointment with you 03/07

## 2022-07-06 NOTE — Telephone Encounter (Signed)
Refill for hydroxyzine sent to pharmacy.   [name], MD  [facility]

## 2022-07-08 DIAGNOSIS — G894 Chronic pain syndrome: Secondary | ICD-10-CM | POA: Diagnosis not present

## 2022-07-08 DIAGNOSIS — M961 Postlaminectomy syndrome, not elsewhere classified: Secondary | ICD-10-CM | POA: Diagnosis not present

## 2022-07-08 DIAGNOSIS — M47817 Spondylosis without myelopathy or radiculopathy, lumbosacral region: Secondary | ICD-10-CM | POA: Diagnosis not present

## 2022-07-08 DIAGNOSIS — M47812 Spondylosis without myelopathy or radiculopathy, cervical region: Secondary | ICD-10-CM | POA: Diagnosis not present

## 2022-07-09 ENCOUNTER — Ambulatory Visit: Payer: Medicare HMO | Admitting: Family Medicine

## 2022-07-09 ENCOUNTER — Encounter: Payer: Self-pay | Admitting: Family Medicine

## 2022-07-09 VITALS — BP 143/84 | HR 62 | Resp 16 | Wt 158.9 lb

## 2022-07-09 DIAGNOSIS — R600 Localized edema: Secondary | ICD-10-CM

## 2022-07-09 DIAGNOSIS — I10 Essential (primary) hypertension: Secondary | ICD-10-CM

## 2022-07-09 MED ORDER — OLMESARTAN MEDOXOMIL-HCTZ 40-25 MG PO TABS: 1.0000 | ORAL_TABLET | Freq: Every day | ORAL | 1 refills | Status: DC

## 2022-07-09 NOTE — Progress Notes (Signed)
I,[name],acting as a scribe for [employer], MD.,have documented all relevant documentation on the behalf of [name], MD,as directed by  [name], MD while in the presence of [name], MD.   Established patient visit   Patient: Joshua Guerrero   DOB: Sep 13, 1955   67 y.o. Male  MRN: [id number] Visit Date: 07/09/2022  Today's healthcare provider: [name], MD   Chief Complaint  Patient presents with   Follow-up   Subjective    HPI  Follow up for Bilateral leg edema  The patient was last seen for this 2 weeks ago. Changes made at last visit include dietary recommendations including limiting salt. Reports swelling better after finishing the prednisone  -----------------------------------------------------------------------------------------  Hypertension, follow-up  BP Readings from Last 3 Encounters:  07/09/22 (!) 143/84  06/18/22 (!) 167/83  05/29/22 (!) 145/77   Wt Readings from Last 3 Encounters:  07/09/22 158 lb 14.4 oz (72.1 kg)  06/18/22 164 lb (74.4 kg)  05/29/22 161 lb (73 kg)     He was last seen for hypertension 2 weeks ago.  BP at that visit was 167/83. Management since that visit includes none.  States that he smokes less than 1/2 PPD per day now  Outside blood pressures are 156/85's. This morning it was 190/85 Symptoms: No chest pain No chest pressure  No palpitations No syncope  No dyspnea No orthopnea  No paroxysmal nocturnal dyspnea No lower extremity edema   Pertinent labs Lab Results  Component Value Date   CHOL 175 08/25/2017   HDL 26 (L) 08/25/2017   LDLCALC 113 (H) 08/25/2017   TRIG 180 (H) 08/25/2017   Lab Results  Component Value Date   NA 138 06/18/2022   K 3.9 06/18/2022   CREATININE 0.73 (L) 06/18/2022   EGFR 102 06/18/2022   GLUCOSE 108 (H) 06/18/2022   TSH 1.610 12/18/2021     The ASCVD Risk score (Arnett DK, et al., 2019) failed to  calculate for the following reasons:   Cannot find a previous HDL lab   Cannot find a previous total cholesterol lab  ---------------------------------------------------------------------------------------------------  Medications: Outpatient Medications Prior to Visit  Medication Sig   baclofen (LIORESAL) 10 MG tablet Take 10 mg by mouth 4 (four) times daily. STARTING AT LUNCHTIME   desvenlafaxine (PRISTIQ) 100 MG 24 hr tablet Take 1 tablet (100 mg total) by mouth daily.   fentaNYL (DURAGESIC - DOSED MCG/HR) 25 MCG/HR patch Place 1 patch onto the skin every 3 (three) days.   hydrOXYzine (ATARAX) 50 MG tablet TAKE 1 TABLET BY MOUTH 3 TIMES DAILY AS NEEDED (USE AS NEEDED FOR SLEEP)   lansoprazole (PREVACID) 30 MG capsule TAKE ONE CAPSULE BY MOUTH TWICE A DAY BEFORE A MEAL   meloxicam (MOBIC) 15 MG tablet Take 1 tablet (15 mg total) by mouth daily as needed for pain.   methylPREDNISolone (MEDROL DOSEPAK) 4 MG TBPK tablet Take as directed in the package.   metoprolol succinate (TOPROL-XL) 50 MG 24 hr tablet Take with or immediately following a meal.   oxyCODONE-acetaminophen (PERCOCET) 10-325 MG tablet Take 1 tablet by mouth every 4 (four) hours. 3AM, 7AM, 11AM, 3 PM, 7PM AND 11PM   prochlorperazine (COMPAZINE) 10 MG tablet Take 1 tablet (10 mg total) by mouth every 8 (eight) hours as needed.   promethazine (PHENERGAN) 25 MG tablet Take 1 tablet (25 mg total) by mouth every 8 (eight) hours as needed.   sucralfate (CARAFATE) 1 g tablet TAKE 1 TABLET  BY MOUTH 4 TIMES A DAY WITH MEALS AND AT BEDTIME   topiramate (TOPAMAX) 50 MG tablet Take 50 mg by mouth 2 (two) times daily.   TRELEGY ELLIPTA 100-62.5-25 MCG/ACT AEPB INHALE 1 PUFF BY MOUTH INTO THE LUNGS DAILY.   [DISCONTINUED] hydrochlorothiazide (HYDRODIURIL) 25 MG tablet Take 1 tablet (25 mg total) by mouth daily.   morphine (MS CONTIN) 15 MG 12 hr tablet Take 1 tablet by mouth as needed.   No facility-administered medications prior to visit.     Review of Systems     Objective    BP (!) 143/84 (BP Location: Left Arm, Patient Position: Sitting, Cuff Size: Normal)   Pulse 62   Resp 16   Wt 158 lb 14.4 oz (72.1 kg)   BMI 24.89 kg/m    Physical Exam Vitals reviewed.  Constitutional:      General: He is not in acute distress.    Appearance: Normal appearance. He is not ill-appearing, toxic-appearing or diaphoretic.  Eyes:     Conjunctiva/sclera: Conjunctivae normal.  Neck:     Thyroid: No thyroid mass, thyromegaly or thyroid tenderness.     Vascular: No carotid bruit.  Cardiovascular:     Rate and Rhythm: Normal rate and regular rhythm.     Pulses: Normal pulses.     Heart sounds: Normal heart sounds. No murmur heard.    No friction rub. No gallop.  Pulmonary:     Effort: Pulmonary effort is normal. No respiratory distress.     Breath sounds: Normal breath sounds. No stridor. No wheezing, rhonchi or rales.  Abdominal:     General: Bowel sounds are normal. There is no distension.     Palpations: Abdomen is soft.     Tenderness: There is no abdominal tenderness.  Musculoskeletal:     Right lower leg: Edema present.     Left lower leg: Edema present.     Comments: Trace LE edema, bilaterally   Lymphadenopathy:     Cervical: No cervical adenopathy.  Skin:    Findings: No erythema or rash.  Neurological:     Mental Status: He is alert and oriented to person, place, and time.      No results found for any visits on 07/09/22.  Assessment & Plan     Problem List Items Addressed This Visit       Cardiovascular and Mediastinum   BP (high blood pressure) - Primary    Chronic  BP elevated, not yet at goal of less than 140/90  Prescribed benicar 40-'25mg'$  daily  Recommended to check BP one hour after medications  Will follow up in 1-2 weeks       Relevant Medications   olmesartan-hydrochlorothiazide (BENICAR HCT) 40-25 MG tablet     Other   Bilateral leg edema    Improved  Recommended monitoring for  increased swelling  No further medications          Return in about 2 weeks (around 07/23/2022) for HTN .       The entirety of the information documented in the History of Present Illness, Review of Systems and Physical Exam were personally obtained by me. Portions of this information were initially documented by Lyndel Pleasure, CMA . I, [name], MD have reviewed the documentation above for thoroughness and accuracy.      [name], MD  [facility] [phone] (phone) [phone] (fax)  [place]

## 2022-07-11 NOTE — Assessment & Plan Note (Signed)
Chronic  BP elevated, not yet at goal of less than 140/90  Prescribed benicar 40-'25mg'$  daily  Recommended to check BP one hour after medications  Will follow up in 1-2 weeks

## 2022-07-11 NOTE — Assessment & Plan Note (Signed)
Improved  Recommended monitoring for increased swelling  No further medications

## 2022-07-13 ENCOUNTER — Ambulatory Visit: Payer: Self-pay | Admitting: *Deleted

## 2022-07-13 NOTE — Telephone Encounter (Signed)
Attempted to return his call.  Left a voicemail to call back. 

## 2022-07-13 NOTE — Telephone Encounter (Signed)
  Chief Complaint: medication assistance Symptoms: NA Frequency: today Pertinent Negatives: NA Disposition: [] ED /[] Urgent Care (no appt availability in office) / [] Appointment(In office/virtual)/ []  [place] [facility]/ [x] Home Care/ [] Refused Recommended Disposition /[] [place] [facility]/ []  Follow-up with PCP Additional Notes: had OV on 07/09/22 with HTN medication changes. Advised pt that he is to stop the hctz 25mg  and start the olmesartan-hctz 40-25mg . Pt asked if he is suppose to still take the metoprolol as well. I advised him yes to take that one as well. Pt verbalized understanding with no further assistance needed.   Reason for Disposition  Caller has medicine question only, adult not sick, AND triager answers question  Answer Assessment - Initial Assessment Questions 1. NAME of MEDICINE: "What medicine(s) are you calling about?"     HTN meds 2. QUESTION: "What is your question?" (e.g., double dose of medicine, side effect)     Wanting know make sure he knows which ones to take 3. PRESCRIBER: "Who prescribed the medicine?" Reason: if prescribed by specialist, call should be referred to that group.     Dr. Quentin Cornwall 4. SYMPTOMS: "Do you have any symptoms?" If Yes, ask: "What symptoms are you having?"  "How bad are the symptoms (e.g., mild, moderate, severe)     NA  Protocols used: Medication Question Call-A-AH

## 2022-07-13 NOTE — Telephone Encounter (Signed)
Message from Erick Blinks sent at 07/13/2022  2:52 PM EDT  Summary: Blood pressure medication questions   Pt has medication questions, wants to know which blood pressure medicine he is supposed to be taking and which one he is not          Call History   Type Contact Phone/Fax User  07/13/2022 02:51 PM EDT Phone (Incoming) [name] "Selinda Flavin" (Self) [phone] Lemmie Evens) Erick Blinks

## 2022-07-14 ENCOUNTER — Ambulatory Visit: Payer: Self-pay

## 2022-07-14 NOTE — Telephone Encounter (Signed)
  Chief Complaint: Low b/p readings Symptoms: none Frequency: Yesterday today. Pertinent Negatives: Patient denies Dizzy lightheadedness Disposition: [] ED /[] Urgent Care (no appt availability in office) / [] Appointment(In office/virtual)/ []  [place] [facility]/ [] Home Care/ [] Refused Recommended Disposition /[] [place] [facility]/ [x]  Follow-up with PCP Additional Notes: PT reports that yesterday his bp was 102/62. Today he has 2 readings   134/85, and 121/77. Pt called yesterday to discuss medications. He still sounds a bit confused about what he should be taking. Pt states that today he took  Metoprolol and Benicar.  Pt states that he takes Fentanyl and is not always clear on things. PT needs further clarification on BP medications.  PT unavailable for Appt tomorrow and refused early morning appt for Thursday. Pt will bring his BP meds a a log of recent readings with him on Thursday.     Pt states that PCP has changed his blood pressure medication to lower his blood pressure. Pt states his reading last night was 102/62 and he is wanting to know what is too low and where should his readings be. Please advise   Answer Assessment - Initial Assessment Questions 1. BLOOD PRESSURE: "What is the blood pressure?" "Did you take at least two measurements 5 minutes apart?"     Yesterday 10pm - 102/62, 134/85 today, 121/77 today 2. ONSET: "When did you take your blood pressure?"     yesterday 3. HOW: "How did you obtain the blood pressure?" (e.g., visiting nurse, automatic home BP monitor)     Home 4. HISTORY: "Do you have a history of low blood pressure?" "What is your blood pressure normally?"     no 5. MEDICINES: "Are you taking any medications for blood pressure?" If Yes, ask: "Have they been changed recently?"     Yes 6. PULSE RATE: "Do you know what your pulse rate is?"      unknown 7. OTHER SYMPTOMS: "Have you been sick recently?" "Have you had a recent injury?"  Protocols used:  Blood Pressure - Low-A-AH

## 2022-07-15 ENCOUNTER — Other Ambulatory Visit: Payer: Self-pay | Admitting: Family Medicine

## 2022-07-15 DIAGNOSIS — I10 Essential (primary) hypertension: Secondary | ICD-10-CM

## 2022-07-15 MED ORDER — OLMESARTAN MEDOXOMIL-HCTZ 20-12.5 MG PO TABS: 1.0000 | ORAL_TABLET | Freq: Every day | ORAL | 2 refills | Status: DC

## 2022-07-15 NOTE — Telephone Encounter (Signed)
Please inform patient of updated dose of benicar hct.   He should now be taking the 20-12.'5mg'$  dose of olmesartan-HCTZ instead of the initially prescribed 40-25 dose   He should continue to take this with his metoprolol '50mg'$  daily    Blood pressure medication summary:   -Benicar (olmesartan-HCTZ) 20-12.'5mg'$  once daily  daily  -Metoprolol '50mg'$  once daily    [name], MD  [facility]

## 2022-07-15 NOTE — Progress Notes (Deleted)
      Established patient visit   Patient: Joshua Guerrero   DOB: 1955-12-15   67 y.o. Male  MRN: [id number] Visit Date: 07/16/2022  Today's healthcare provider: [name], MD   No chief complaint on file.  Subjective    HPI  Patient comes in with blood pressure concerns. He would like to know where blood pressures reading should be. Patient reports that on 07/13/2022 his bp was 102/62. On 06/15/2022 had 2 readings 134/85, and 121/77.    Wt Readings from Last 3 Encounters:  07/09/22 158 lb 14.4 oz (72.1 kg)  06/18/22 164 lb (74.4 kg)  05/29/22 161 lb (73 kg)    BP Readings from Last 3 Encounters:  07/09/22 (!) 143/84  06/18/22 (!) 167/83  05/29/22 (!) 145/77     Medications: Outpatient Medications Prior to Visit  Medication Sig   baclofen (LIORESAL) 10 MG tablet Take 10 mg by mouth 4 (four) times daily. STARTING AT LUNCHTIME   desvenlafaxine (PRISTIQ) 100 MG 24 hr tablet Take 1 tablet (100 mg total) by mouth daily.   fentaNYL (DURAGESIC - DOSED MCG/HR) 25 MCG/HR patch Place 1 patch onto the skin every 3 (three) days.   hydrOXYzine (ATARAX) 50 MG tablet TAKE 1 TABLET BY MOUTH 3 TIMES DAILY AS NEEDED (USE AS NEEDED FOR SLEEP)   lansoprazole (PREVACID) 30 MG capsule TAKE ONE CAPSULE BY MOUTH TWICE A DAY BEFORE A MEAL   meloxicam (MOBIC) 15 MG tablet Take 1 tablet (15 mg total) by mouth daily as needed for pain.   methylPREDNISolone (MEDROL DOSEPAK) 4 MG TBPK tablet Take as directed in the package.   metoprolol succinate (TOPROL-XL) 50 MG 24 hr tablet Take with or immediately following a meal.   olmesartan-hydrochlorothiazide (BENICAR HCT) 40-25 MG tablet Take 1 tablet by mouth daily.   oxyCODONE-acetaminophen (PERCOCET) 10-325 MG tablet Take 1 tablet by mouth every 4 (four) hours. 3AM, 7AM, 11AM, 3 PM, 7PM AND 11PM   prochlorperazine (COMPAZINE) 10 MG tablet Take 1 tablet (10 mg total) by mouth every 8 (eight) hours as needed.   promethazine (PHENERGAN) 25 MG  tablet Take 1 tablet (25 mg total) by mouth every 8 (eight) hours as needed.   sucralfate (CARAFATE) 1 g tablet TAKE 1 TABLET BY MOUTH 4 TIMES A DAY WITH MEALS AND AT BEDTIME   topiramate (TOPAMAX) 50 MG tablet Take 50 mg by mouth 2 (two) times daily.   TRELEGY ELLIPTA 100-62.5-25 MCG/ACT AEPB INHALE 1 PUFF BY MOUTH INTO THE LUNGS DAILY.   No facility-administered medications prior to visit.    Review of Systems  {Labs  Heme  Chem  Endocrine  Serology  Results Review (optional):23779}   Objective    There were no vitals taken for this visit. {Show previous vital signs (optional):23777}  Physical Exam  ***  No results found for any visits on 07/16/22.  Assessment & Plan     ***  No follow-ups on file.      {provider attestation***:1}   [name], MD  [facility] [phone] (phone) [phone] (fax)  [place]

## 2022-07-16 ENCOUNTER — Ambulatory Visit: Payer: Medicare HMO | Admitting: Family Medicine

## 2022-07-16 NOTE — Telephone Encounter (Signed)
[id number]-Ok for PEC nurse to give patient providers message. 

## 2022-07-16 NOTE — Telephone Encounter (Signed)
Patient notified of provider response. Patient states he understands dosing instructions.

## 2022-07-20 ENCOUNTER — Other Ambulatory Visit: Payer: Self-pay | Admitting: Pulmonary Disease

## 2022-07-24 NOTE — Progress Notes (Unsigned)
I,Joshua Guerrero,acting as a scribe for [employer], MD.,have documented all relevant documentation on the behalf of Joshua Foster, MD,as directed by  Joshua Foster, MD while in the presence of Joshua Foster, MD.   Established patient visit   Patient: Joshua Guerrero   DOB: 08/27/1955   67 y.o. Male  MRN: [id number] Visit Date: 07/27/2022  Today's healthcare provider: [name], MD   Chief Complaint  Patient presents with   follow-Up HTN   Fall   Subjective    HPI  Hypertension, follow-up  BP Readings from Last 3 Encounters:  07/27/22 108/67  07/09/22 (!) 143/84  06/18/22 (!) 167/83   Wt Readings from Last 3 Encounters:  07/27/22 155 lb 12.8 oz (70.7 kg)  07/09/22 158 lb 14.4 oz (72.1 kg)  06/18/22 164 lb (74.4 kg)     He was last seen for hypertension 2 weeks ago.  BP at that visit was 143/84. Management since that visit includes Prescribed benicar 40-25mg  daily and continue Metoprolol  He reports excellent compliance with treatment. He is not having side effects.   Outside blood pressures are 125-135/70-80's.  Symptoms: No chest pain No chest pressure  No palpitations No syncope  No dyspnea No orthopnea  No paroxysmal nocturnal dyspnea Yes lower extremity edema   Pertinent labs Lab Results  Component Value Date   CHOL 175 08/25/2017   HDL 26 (L) 08/25/2017   LDLCALC 113 (H) 08/25/2017   TRIG 180 (H) 08/25/2017   Lab Results  Component Value Date   NA 138 06/18/2022   K 3.9 06/18/2022   CREATININE 0.73 (L) 06/18/2022   EGFR 102 06/18/2022   GLUCOSE 108 (H) 06/18/2022   TSH 1.610 12/18/2021     The ASCVD Risk score (Arnett DK, et al., 2019) failed to calculate for the following reasons:   Cannot find a previous HDL lab   Cannot find a previous total cholesterol lab  ---------------------------------------------------------------------------------------------------  Fall: Patient  reports that he fell about a week in half ago. He thinks he was sleep walking because doesn't remember falling. He has been having trouble sleeping so he went to [place] some sleep med "blue gel capsule" and this happened after he took the medication. He has knot on his left hip and feels he might have a fractured rib. He states that he has been sleep walking since he was a child.      11/28/2020    8:30 AM 03/04/2021    2:56 PM 05/27/2021    1:12 PM 05/28/2022    1:09 PM 07/27/2022    2:47 PM  Fall Risk  Falls in the past year? 0 0 0 0 1  Was there an injury with Fall? 0 0 0 0 1  Fall Risk Category Calculator 0 0 0 0 2  Fall Risk Category (Retired) Low Low Low    (RETIRED) Patient Fall Risk Level Low fall risk Moderate fall risk Low fall risk    Patient at Risk for Falls Due to No Fall Risks Impaired balance/gait No Fall Risks No Fall Risks Other (Comment)  Patient at Risk for Falls Due to - Comments     fell  Fall risk Follow up Falls evaluation completed Falls evaluation completed Falls evaluation completed       Medications: Outpatient Medications Prior to Visit  Medication Sig   baclofen (LIORESAL) 10 MG tablet Take 10 mg by mouth 4 (four) times daily. STARTING AT LUNCHTIME   desvenlafaxine (  PRISTIQ) 100 MG 24 hr tablet Take 1 tablet (100 mg total) by mouth daily.   fentaNYL (DURAGESIC - DOSED MCG/HR) 25 MCG/HR patch Place 1 patch onto the skin every 3 (three) days.   lansoprazole (PREVACID) 30 MG capsule TAKE ONE CAPSULE BY MOUTH TWICE A DAY BEFORE A MEAL   metoprolol succinate (TOPROL-XL) 50 MG 24 hr tablet Take with or immediately following a meal.   olmesartan-hydrochlorothiazide (BENICAR HCT) 20-12.5 MG tablet Take 1 tablet by mouth daily.   oxyCODONE-acetaminophen (PERCOCET) 10-325 MG tablet Take 1 tablet by mouth every 4 (four) hours. 3AM, 7AM, 11AM, 3 PM, 7PM AND 11PM   sucralfate (CARAFATE) 1 g tablet TAKE 1 TABLET BY MOUTH 4 TIMES A DAY WITH MEALS AND AT  BEDTIME   topiramate (TOPAMAX) 50 MG tablet Take 50 mg by mouth 2 (two) times daily.   TRELEGY ELLIPTA 100-62.5-25 MCG/ACT AEPB INHALE 1 PUFF BY MOUTH INTO THE LUNGS DAILY   [DISCONTINUED] hydrOXYzine (ATARAX) 50 MG tablet TAKE 1 TABLET BY MOUTH 3 TIMES DAILY AS NEEDED (USE AS NEEDED FOR SLEEP)   [DISCONTINUED] meloxicam (MOBIC) 15 MG tablet Take 1 tablet (15 mg total) by mouth daily as needed for pain.   [DISCONTINUED] prochlorperazine (COMPAZINE) 10 MG tablet Take 1 tablet (10 mg total) by mouth every 8 (eight) hours as needed.   [DISCONTINUED] promethazine (PHENERGAN) 25 MG tablet Take 1 tablet (25 mg total) by mouth every 8 (eight) hours as needed.   [DISCONTINUED] methylPREDNISolone (MEDROL DOSEPAK) 4 MG TBPK tablet Take as directed in the package.   No facility-administered medications prior to visit.    Review of Systems     Objective    BP 108/67 (BP Location: Left Arm, Patient Position: Sitting, Cuff Size: Normal)   Pulse 70   Resp 12   Wt 155 lb 12.8 oz (70.7 kg)   SpO2 97%   BMI 24.40 kg/m    Physical Exam Vitals reviewed.  Constitutional:      General: He is not in acute distress.    Appearance: Normal appearance. He is not ill-appearing, toxic-appearing or diaphoretic.  Eyes:     Conjunctiva/sclera: Conjunctivae normal.  Neck:     Thyroid: No thyroid mass, thyromegaly or thyroid tenderness.     Vascular: No carotid bruit.  Cardiovascular:     Rate and Rhythm: Normal rate and regular rhythm.     Pulses: Normal pulses.     Heart sounds: Normal heart sounds. No murmur heard.    No friction rub. No gallop.  Pulmonary:     Effort: Pulmonary effort is normal. No respiratory distress.     Breath sounds: Normal breath sounds. No stridor. No wheezing, rhonchi or rales.  Abdominal:     General: Bowel sounds are normal. There is no distension.     Palpations: Abdomen is soft.     Tenderness: There is no abdominal tenderness.  Musculoskeletal:     Right lower leg: No  edema.     Left lower leg: No edema.  Lymphadenopathy:     Cervical: No cervical adenopathy.  Skin:    Findings: No erythema or rash.  Neurological:     Mental Status: He is alert and oriented to person, place, and time.       No results found for any visits on 07/27/22.  Assessment & Plan     Problem List Items Addressed This Visit       Cardiovascular and Mediastinum   BP (high blood pressure)  Controlled BP at goal Continue current medications at current doses, metoprolol 50mg  and Benicar 20-12.5mg  daily  No medications changes today          Other   Chronic neck and back pain    Chronic  Managed with pain management  Refill provided for mobic 15mg  PRN daily       Relevant Medications   traZODone (DESYREL) 50 MG tablet   meloxicam (MOBIC) 15 MG tablet   Chronic nausea    Refills provided for promethazine 25mg  and compazine 10mg   Continue current medications  Symptoms well controlled on this regimen  Nausea 2/2 to fentanyl       Relevant Medications   promethazine (PHENERGAN) 25 MG tablet   prochlorperazine (COMPAZINE) 10 MG tablet   Primary insomnia - Primary    Chronic  Patient reports no improvement with lifestyle adjustments  Will trial 25mg  trazodone at bedtime  Discussed symptoms to be mindful of regarding serotonin syndrome risk, patient voiced understanding Recommended against ambien or xanax due to history of falls and sleep walking also counseled on age close to 80 and recommendation against these meds along with his pain regimen        Hematoma of right hip    Acute  Improving per patient report  Image in physical exam portion of note  Offered xray however patient declines  Patient is ambulating without signs of difficulty today  Emphasized importance of monitoring for dizziness on medication regimen as this can increase risks of falls and complex injuries         Return in about 3 months (around 10/27/2022) for insomnia, htn.        The entirety of the information documented in the History of Present Illness, Review of Systems and Physical Exam were personally obtained by me. Portions of this information were initially documented by Lyndel Pleasure, CMA. I, Joshua Foster, MD have reviewed the documentation above for thoroughness and accuracy.      Joshua Foster, MD  [facility] [phone] (phone) [phone] (fax)  [place]

## 2022-07-27 ENCOUNTER — Encounter: Payer: Self-pay | Admitting: Family Medicine

## 2022-07-27 ENCOUNTER — Ambulatory Visit (INDEPENDENT_AMBULATORY_CARE_PROVIDER_SITE_OTHER): Payer: Medicare HMO | Admitting: Family Medicine

## 2022-07-27 VITALS — BP 108/67 | HR 70 | Resp 12 | Wt 155.8 lb

## 2022-07-27 DIAGNOSIS — G8929 Other chronic pain: Secondary | ICD-10-CM | POA: Diagnosis not present

## 2022-07-27 DIAGNOSIS — F5101 Primary insomnia: Secondary | ICD-10-CM | POA: Diagnosis not present

## 2022-07-27 DIAGNOSIS — R110 Nausea: Secondary | ICD-10-CM

## 2022-07-27 DIAGNOSIS — S7001XA Contusion of right hip, initial encounter: Secondary | ICD-10-CM | POA: Insufficient documentation

## 2022-07-27 DIAGNOSIS — M549 Dorsalgia, unspecified: Secondary | ICD-10-CM | POA: Diagnosis not present

## 2022-07-27 DIAGNOSIS — R69 Illness, unspecified: Secondary | ICD-10-CM | POA: Diagnosis not present

## 2022-07-27 DIAGNOSIS — I10 Essential (primary) hypertension: Secondary | ICD-10-CM | POA: Diagnosis not present

## 2022-07-27 DIAGNOSIS — M542 Cervicalgia: Secondary | ICD-10-CM | POA: Diagnosis not present

## 2022-07-27 MED ORDER — TRAZODONE HCL 50 MG PO TABS: 25.0000 mg | ORAL_TABLET | Freq: Every evening | ORAL | 0 refills | Status: DC | PRN

## 2022-07-27 MED ORDER — PROMETHAZINE HCL 25 MG PO TABS: 25.0000 mg | ORAL_TABLET | Freq: Three times a day (TID) | ORAL | 1 refills | Status: DC | PRN

## 2022-07-27 MED ORDER — PROCHLORPERAZINE MALEATE 10 MG PO TABS: 10.0000 mg | ORAL_TABLET | Freq: Three times a day (TID) | ORAL | 1 refills | Status: DC | PRN

## 2022-07-27 MED ORDER — MELOXICAM 15 MG PO TABS: 15.0000 mg | ORAL_TABLET | Freq: Every day | ORAL | 3 refills | Status: DC | PRN

## 2022-07-27 NOTE — Assessment & Plan Note (Signed)
Acute  Improving per patient report  Image in physical exam portion of note  Offered xray however patient declines  Patient is ambulating without signs of difficulty today  Emphasized importance of monitoring for dizziness on medication regimen as this can increase risks of falls and complex injuries

## 2022-07-27 NOTE — Assessment & Plan Note (Signed)
Chronic  Patient reports no improvement with lifestyle adjustments  Will trial 25mg  trazodone at bedtime  Discussed symptoms to be mindful of regarding serotonin syndrome risk, patient voiced understanding Recommended against ambien or xanax due to history of falls and sleep walking also counseled on age close to 68 and recommendation against these meds along with his pain regimen

## 2022-07-27 NOTE — Patient Instructions (Signed)
It was a pleasure to see you today!  Thank you for choosing [facility] for your primary care.   Joshua Guerrero was seen for blood pressure and insomnia.   Our plans for today were: I have prescribed Trazodone 25mg  (1/2 tablet) one hour before bedtime Please stop taking the hydroxyzine 25mg  at bedtime I have also refilled your medications  Your blood pressure is much improved, please continue the medications as prescribed   You should return to our clinic in 3 months for blood pressure and sleep .   Best Wishes,   Dr. Quentin Cornwall

## 2022-07-27 NOTE — Assessment & Plan Note (Signed)
Controlled BP at goal Continue current medications at current doses, metoprolol 50mg  and Benicar 20-12.5mg  daily  No medications changes today

## 2022-07-27 NOTE — Assessment & Plan Note (Signed)
Chronic  Managed with pain management  Refill provided for mobic 15mg  PRN daily

## 2022-07-27 NOTE — Assessment & Plan Note (Signed)
Refills provided for promethazine 25mg  and compazine 10mg   Continue current medications  Symptoms well controlled on this regimen  Nausea 2/2 to fentanyl

## 2022-07-30 ENCOUNTER — Telehealth: Payer: Self-pay | Admitting: Family Medicine

## 2022-07-30 NOTE — Telephone Encounter (Signed)
Advised 

## 2022-07-30 NOTE — Telephone Encounter (Signed)
Pt says he was prescribed traZODone (DESYREL) 50 MG tablet [id number] to help him sleep. Pt says he has been taking a half of tablet as instructed and it hasn't been helping him sleep. Pt says he has had to take other medications to help him sleep and wants to know how much of the medication should he be taking or is there something else he can be prescribed. Please advise.

## 2022-08-03 ENCOUNTER — Encounter: Payer: Self-pay | Admitting: *Deleted

## 2022-08-03 ENCOUNTER — Ambulatory Visit: Payer: Self-pay | Admitting: *Deleted

## 2022-08-03 NOTE — Progress Notes (Unsigned)
I,Joshua Guerrero,acting as a scribe for [employer], MD.,have documented all relevant documentation on the behalf of Joshua Foster, MD,as directed by  Joshua Foster, MD while in the presence of Joshua Foster, MD.   Established patient visit   Patient: Joshua Guerrero   DOB: 12-02-1955   67 y.o. Male  MRN: [id number] Visit Date: 08/05/2022  Today's healthcare provider: [name], MD   Chief Complaint  Patient presents with   Follow-up   Subjective    HPI   Medication Follow up  HTN  Patient was seen for this problem a week ago. He was prescribed trazodone 50mg  and recommended to take 1/2 tablet before bed time  He called reporting no improvement in his insomnia and was instructed to take 1 whole tablet  Today he states still feels unstable and stop Trazodone since yesterday. He also stopped Metoprolol. Reports that he still took Benicar today Reports BP reading this morning 120/83 then took Benicar and BP was 107/73.   Chronic Neck and Back Pain  Reports that he is in a lot of pain in his neck and back . This is chronic pain  He reports following with pain management regularly   GERD Patient was previously taking Prevacid twice daily He states that his insurance company will cover omeprazole 40 mg once daily He is requesting a prescription for omeprazole 40 mg Reports that symptoms are well-controlled on PPI  BP Readings from Last 3 Encounters:  08/05/22 102/63  07/27/22 108/67  07/09/22 (!) 143/84      Medications: Outpatient Medications Prior to Visit  Medication Sig   baclofen (LIORESAL) 10 MG tablet Take 10 mg by mouth 4 (four) times daily. STARTING AT LUNCHTIME   desvenlafaxine (PRISTIQ) 100 MG 24 hr tablet Take 1 tablet (100 mg total) by mouth daily.   fentaNYL (DURAGESIC - DOSED MCG/HR) 25 MCG/HR patch Place 1 patch onto the skin every 3 (three) days.   meloxicam (MOBIC) 15 MG tablet Take 1  tablet (15 mg total) by mouth daily as needed for pain.   oxyCODONE-acetaminophen (PERCOCET) 10-325 MG tablet Take 1 tablet by mouth every 4 (four) hours. 3AM, 7AM, 11AM, 3 PM, 7PM AND 11PM   prochlorperazine (COMPAZINE) 10 MG tablet Take 1 tablet (10 mg total) by mouth every 8 (eight) hours as needed.   promethazine (PHENERGAN) 25 MG tablet Take 1 tablet (25 mg total) by mouth every 8 (eight) hours as needed.   sucralfate (CARAFATE) 1 g tablet TAKE 1 TABLET BY MOUTH 4 TIMES A DAY WITH MEALS AND AT BEDTIME   traZODone (DESYREL) 50 MG tablet Take 0.5 tablets (25 mg total) by mouth at bedtime as needed for sleep.   TRELEGY ELLIPTA 100-62.5-25 MCG/ACT AEPB INHALE 1 PUFF BY MOUTH INTO THE LUNGS DAILY   [DISCONTINUED] olmesartan-hydrochlorothiazide (BENICAR HCT) 20-12.5 MG tablet Take 1 tablet by mouth daily.   metoprolol succinate (TOPROL-XL) 50 MG 24 hr tablet Take with or immediately following a meal. (Patient not taking: Reported on 08/05/2022)   topiramate (TOPAMAX) 50 MG tablet Take 50 mg by mouth 2 (two) times daily. (Patient not taking: Reported on 08/05/2022)   [DISCONTINUED] lansoprazole (PREVACID) 30 MG capsule TAKE ONE CAPSULE BY MOUTH TWICE A DAY BEFORE A MEAL   No facility-administered medications prior to visit.    Review of Systems     Objective    BP 102/63 (BP Location: Left Arm, Patient Position: Sitting, Cuff Size: Normal)   Pulse 86   Temp  99 F (37.2 C) (Oral)   Resp 16   Wt 147 lb 12.8 oz (67 kg)   BMI 23.15 kg/m    Physical Exam Vitals reviewed.  Constitutional:      General: He is not in acute distress.    Appearance: Normal appearance. He is not ill-appearing.  Pulmonary:     Effort: Pulmonary effort is normal. No respiratory distress.  Musculoskeletal:     Thoracic back: No swelling or deformity.     Lumbar back: Spasms and tenderness present.     Comments: Unable to perform straight leg raise testing as patient states that this will cause extreme  pain Patient is unable to flex or extend his spine secondary to tenderness Patient is unable to rotate his spine due to pain   Neurological:     Mental Status: He is alert.      No results found for any visits on 08/05/22.  Assessment & Plan     Problem List Items Addressed This Visit       Cardiovascular and Mediastinum   BP (high blood pressure)    Chronic Hypotensive today patient Benicar today Recommended that he continue with only metoprolol 50 mg once daily for the next 5 days Discontinue Benicar Will prescribe olmesartan 5 mg daily if he has blood pressures that are consistently elevated above 140/90 after holding additional (continue metoprolol 50mg  daily, no holding) antihypertensives for 5 days Patient to follow-up in 10 days for blood pressure check Counseled patient to proceed to emergency department for urgent evaluation if he develops elevated blood pressures above 180/100 with headache, chest pain or blurry vision or if blood pressures are elevated above [id number] systolic, patient voiced understanding       Relevant Medications   olmesartan (BENICAR) 5 MG tablet     Digestive   Acid reflux    Chronic Symptoms well-controlled on PPI Patient requesting to change from Prevacid to omeprazole 40 mg Prescribed omeprazole 40 mg daily to patient's [facility]      Relevant Medications   omeprazole (PRILOSEC) 40 MG capsule     Other   Chronic neck and back pain - Primary    Chronic Patient follows with pain management and takes fentanyl 25 mcg patches, Percocet 10-325 mg every 4 hours as well as meloxicam 15 mg daily as needed Patient reports that Toradol injection for increased severe neck and back pain Toradol 60 mg ordered, patient tolerated injection well He will continue to follow with pain management as scheduled        Return in about 10 days (around 08/15/2022) for low blood pressure.       The entirety of the information documented in the  History of Present Illness, Review of Systems and Physical Exam were personally obtained by me. Portions of this information were initially documented by Joshua Guerrero, CMA . I, Joshua Foster, MD have reviewed the documentation above for thoroughness and accuracy.      Joshua Foster, MD  [facility] [phone] (phone) [phone] (fax)  [place]

## 2022-08-03 NOTE — Telephone Encounter (Signed)
Please Review

## 2022-08-03 NOTE — Telephone Encounter (Signed)
Summary: Medication makes pt. feel unstable   Patient states medication he is taking makes him feel a little unstable, he would like a call to discuss  metoprolol succinate (TOPROL-XL) 50 MG 24 hr tablet olmesartan-hydrochlorothiazide (BENICAR HCT) 20-12.5 MG tablet  Has not taken today's dose       Chief Complaint: side effects of medication Symptoms: feeling "unstable" after taking medications toprol XL 50 mg and benicar. Reports he has felt this since starting medications but has now realized sx of "feeling unstable" are from toprol and benicar. Can not give specific sx reports fentanyl "makes me feel wide awake" does not feel sx are caused by this medication. Requesting shot of toradol. BP 121/75 per patient  Frequency: na  Pertinent Negatives: Patient denies dizziness no chest pain no difficulty breathing .  Disposition: [] ED /[] Urgent Care (no appt availability in office) / [x] Appointment(In office/virtual)/ []  [place] [facility]/ [] Home Care/ [] Refused Recommended Disposition /[] [place] [facility]/ []  Follow-up with PCP Additional Notes:   Appt scheduled with PCP first available 08/05/22. Patient reports he did take medications today . Please advise if earlier appt available with PCP to discuss medications.     Reason for Disposition  [1] Caller has URGENT medicine question about med that PCP or specialist prescribed AND [2] triager unable to answer question  Answer Assessment - Initial Assessment Questions 1. NAME of MEDICINE: "What medicine(s) are you calling about?"     Torpol XL 50 mg and benicar 2. QUESTION: "What is your question?" (e.g., double dose of medicine, side effect)     Reports feeling "unstable" since taking medications.  3. PRESCRIBER: "Who prescribed the medicine?" Reason: if prescribed by specialist, call should be referred to that group.     PCP 4. SYMPTOMS: "Do you have any symptoms?" If Yes, ask: "What symptoms are you having?"  "How bad are the  symptoms (e.g., mild, moderate, severe)     Did not report specific sx only reports feeling "unstable". Requesting shot of toradol. Reports fentanyl makes him feel "wide awake" 5. PREGNANCY:  "Is there any chance that you are pregnant?" "When was your last menstrual period?"     na  Protocols used: Medication Question Call-A-AH

## 2022-08-03 NOTE — Telephone Encounter (Signed)
Patient may be having some effects of recently started trazodone. I would recommend holding trazodone until appointment. Please continue monitoring BP and take dose of benicar today. Can hold benicar the day of appt if symptoms are not improved while holding trazodone, otherwise continue benicar.   [name], MD  [facility]

## 2022-08-04 DIAGNOSIS — M47812 Spondylosis without myelopathy or radiculopathy, cervical region: Secondary | ICD-10-CM | POA: Diagnosis not present

## 2022-08-04 DIAGNOSIS — M961 Postlaminectomy syndrome, not elsewhere classified: Secondary | ICD-10-CM | POA: Diagnosis not present

## 2022-08-04 DIAGNOSIS — G894 Chronic pain syndrome: Secondary | ICD-10-CM | POA: Diagnosis not present

## 2022-08-04 DIAGNOSIS — M47817 Spondylosis without myelopathy or radiculopathy, lumbosacral region: Secondary | ICD-10-CM | POA: Diagnosis not present

## 2022-08-04 NOTE — Telephone Encounter (Signed)
Patient advised.

## 2022-08-05 ENCOUNTER — Ambulatory Visit (INDEPENDENT_AMBULATORY_CARE_PROVIDER_SITE_OTHER): Payer: Medicare HMO | Admitting: Family Medicine

## 2022-08-05 ENCOUNTER — Encounter: Payer: Self-pay | Admitting: Family Medicine

## 2022-08-05 VITALS — BP 102/63 | HR 86 | Temp 99.0°F | Resp 16 | Wt 147.8 lb

## 2022-08-05 DIAGNOSIS — K219 Gastro-esophageal reflux disease without esophagitis: Secondary | ICD-10-CM

## 2022-08-05 DIAGNOSIS — M542 Cervicalgia: Secondary | ICD-10-CM | POA: Diagnosis not present

## 2022-08-05 DIAGNOSIS — M549 Dorsalgia, unspecified: Secondary | ICD-10-CM | POA: Diagnosis not present

## 2022-08-05 DIAGNOSIS — G8929 Other chronic pain: Secondary | ICD-10-CM

## 2022-08-05 DIAGNOSIS — I10 Essential (primary) hypertension: Secondary | ICD-10-CM

## 2022-08-05 MED ORDER — OMEPRAZOLE 40 MG PO CPDR: 40.0000 mg | DELAYED_RELEASE_CAPSULE | Freq: Every day | ORAL | 3 refills | Status: DC

## 2022-08-05 MED ORDER — OLMESARTAN MEDOXOMIL 5 MG PO TABS: 5.0000 mg | ORAL_TABLET | Freq: Every day | ORAL | 0 refills | Status: DC

## 2022-08-05 MED ORDER — KETOROLAC TROMETHAMINE 60 MG/2ML IM SOLN
60.0000 mg | Freq: Once | INTRAMUSCULAR | Status: AC
  Administered 2022-08-05: 30 mg via INTRAMUSCULAR

## 2022-08-05 NOTE — Patient Instructions (Signed)
It was a pleasure to see you today!  Thank you for choosing [facility] for your primary care.   [name] was seen for blood pressure and back/neck pain.   Our plans for today were: Please stop taking the Benicar for the next 5 days.  Please continue the blood pressure monitoring daily for the next 2 weeks and follow up with me for blood pressure check because your levels were low today  I have prescribed olmesartan 5mg  daily if your blood pressures are consistently elevated above 140/90 next week  Please do not start the new medication if your blood pressure is less than 110/60 and just continue the metoprolol only 50mg  You can restart the trazodone after 2 additional days      You should return to our clinic in 10 days for blood pressure.   Best Wishes,   Dr. Quentin Cornwall

## 2022-08-05 NOTE — Assessment & Plan Note (Addendum)
Chronic Hypotensive today patient Benicar today Recommended that he continue with only metoprolol 50 mg once daily for the next 5 days Discontinue Benicar Will prescribe olmesartan 5 mg daily if he has blood pressures that are consistently elevated above 140/90 after holding additional (continue metoprolol 50mg  daily, no holding) antihypertensives for 5 days Patient to follow-up in 10 days for blood pressure check Counseled patient to proceed to emergency department for urgent evaluation if he develops elevated blood pressures above 180/100 with headache, chest pain or blurry vision or if blood pressures are elevated above [id number] systolic, patient voiced understanding

## 2022-08-05 NOTE — Assessment & Plan Note (Signed)
Chronic Patient follows with pain management and takes fentanyl 25 mcg patches, Percocet 10-325 mg every 4 hours as well as meloxicam 15 mg daily as needed Patient reports that Toradol injection for increased severe neck and back pain Toradol 60 mg ordered, patient tolerated injection well He will continue to follow with pain management as scheduled

## 2022-08-05 NOTE — Assessment & Plan Note (Signed)
Chronic Symptoms well-controlled on PPI Patient requesting to change from Prevacid to omeprazole 40 mg Prescribed omeprazole 40 mg daily to patient's [facility] pharmacy

## 2022-08-07 ENCOUNTER — Other Ambulatory Visit: Payer: Self-pay | Admitting: Family Medicine

## 2022-08-07 DIAGNOSIS — I10 Essential (primary) hypertension: Secondary | ICD-10-CM

## 2022-08-07 MED ORDER — OLMESARTAN MEDOXOMIL 5 MG PO TABS: 5.0000 mg | ORAL_TABLET | Freq: Every day | ORAL | 1 refills | Status: DC

## 2022-08-07 NOTE — Telephone Encounter (Signed)
Medication Refill - Medication: olmesartan (BENICAR) 5 MG tablet    (Can medication be sent to [facility], and not mail order pharmacy)   Has the patient contacted their pharmacy? Yes.    (Preferred Pharmacy (with phone number or street name):  [facility] - [place], [place] - [address]  [address] [place] [address]  Phone: [phone] Fax: [phone]  Hours: Not open 24 hours      Has the patient been seen for an appointment in the last year OR does the patient have an upcoming appointment? Yes.    Agent: Please be advised that RX refills may take up to 3 business days. We ask that you follow-up with your pharmacy.

## 2022-08-07 NOTE — Telephone Encounter (Signed)
Requested Prescriptions  Pending Prescriptions Disp Refills   olmesartan (BENICAR) 5 MG tablet 90 tablet 1    Sig: Take 1 tablet (5 mg total) by mouth daily.     Cardiovascular:  Angiotensin Receptor Blockers Failed - 08/07/2022 12:32 PM      Failed - Cr in normal range and within 180 days    Creatinine  Date Value Ref Range Status  12/12/2011 1.09 0.60 - 1.30 mg/dL Final   Creatinine, Ser  Date Value Ref Range Status  06/18/2022 0.73 (L) 0.76 - 1.27 mg/dL Final         Passed - K in normal range and within 180 days    Potassium  Date Value Ref Range Status  06/18/2022 3.9 3.5 - 5.2 mmol/L Final  12/12/2011 3.0 (L) 3.5 - 5.1 mmol/L Final         Passed - Patient is not pregnant      Passed - Last BP in normal range    BP Readings from Last 1 Encounters:  08/05/22 102/63         Passed - Valid encounter within last 6 months    Recent Outpatient Visits           2 days ago Chronic neck and back pain   [place] [facility] Simmons-Robinson, [place], MD   1 week ago Primary insomnia   [place] [facility] [place], [place], MD   4 weeks ago Primary hypertension   [place] [facility] [place], [place], MD   1 month ago Bilateral leg edema   [place] [facility] [place], [place], MD   2 months ago Chronic nausea   [place] [facility] Simmons-Robinson, [place], MD       Future Appointments             In 1 week Simmons-Robinson, Tawanna Cooler, MD [facility]   In 2 months Simmons-Robinson, [place], MD [facility], [place]

## 2022-08-19 ENCOUNTER — Ambulatory Visit (INDEPENDENT_AMBULATORY_CARE_PROVIDER_SITE_OTHER): Payer: Medicare HMO | Admitting: Family Medicine

## 2022-08-19 ENCOUNTER — Encounter: Payer: Self-pay | Admitting: Family Medicine

## 2022-08-19 VITALS — BP 142/79 | HR 61 | Temp 98.7°F | Resp 16 | Wt 157.5 lb

## 2022-08-19 DIAGNOSIS — R2689 Other abnormalities of gait and mobility: Secondary | ICD-10-CM | POA: Diagnosis not present

## 2022-08-19 DIAGNOSIS — I10 Essential (primary) hypertension: Secondary | ICD-10-CM | POA: Diagnosis not present

## 2022-08-19 DIAGNOSIS — R110 Nausea: Secondary | ICD-10-CM

## 2022-08-19 DIAGNOSIS — F330 Major depressive disorder, recurrent, mild: Secondary | ICD-10-CM | POA: Diagnosis not present

## 2022-08-19 DIAGNOSIS — F32A Depression, unspecified: Secondary | ICD-10-CM

## 2022-08-19 DIAGNOSIS — R69 Illness, unspecified: Secondary | ICD-10-CM | POA: Diagnosis not present

## 2022-08-19 MED ORDER — TRAZODONE HCL 50 MG PO TABS: 25.0000 mg | ORAL_TABLET | Freq: Every evening | ORAL | 0 refills | Status: DC | PRN

## 2022-08-19 MED ORDER — DESVENLAFAXINE SUCCINATE ER 100 MG PO TB24: 100.0000 mg | ORAL_TABLET | Freq: Every day | ORAL | 1 refills | Status: DC

## 2022-08-19 MED ORDER — PROMETHAZINE HCL 25 MG PO TABS: 25.0000 mg | ORAL_TABLET | Freq: Three times a day (TID) | ORAL | 1 refills | Status: DC | PRN

## 2022-08-19 MED ORDER — OLMESARTAN MEDOXOMIL 5 MG PO TABS
10.0000 mg | ORAL_TABLET | Freq: Every day | ORAL | 1 refills | Status: DC
Start: 2022-08-19 — End: 2022-12-21

## 2022-08-19 MED ORDER — PROCHLORPERAZINE MALEATE 10 MG PO TABS: 10.0000 mg | ORAL_TABLET | Freq: Three times a day (TID) | ORAL | 1 refills | Status: DC | PRN

## 2022-08-19 NOTE — Assessment & Plan Note (Signed)
Chronic Stable Refills provided for Pristiq 100 mg daily

## 2022-08-19 NOTE — Assessment & Plan Note (Addendum)
Evidence of some problems with coordination based on patient's exam today and inability to complete tandem walk or rapid finger movement Recommended physical therapy referral for balance concerns, patient was in agreement with plan

## 2022-08-19 NOTE — Progress Notes (Signed)
I,[name],acting as a scribe for [employer], MD.,have documented all relevant documentation on the behalf of [name], MD,as directed by  [name], MD while in the presence of [name], MD.   Established patient visit   Patient: Joshua DOUBLEDAY   DOB: [birth year]   67 y.o. Male  MRN: [id number] Visit Date: 08/19/2022  Today's healthcare provider: [name], MD   Chief Complaint  Patient presents with   Follow-up   Subjective    HPI   Hypertension, follow-up  BP Readings from Last 3 Encounters:  08/19/22 (!) 142/79  08/05/22 102/63  07/27/22 108/67   Wt Readings from Last 3 Encounters:  08/19/22 157 lb 8 oz (71.4 kg)  08/05/22 147 lb 12.8 oz (67 kg)  07/27/22 155 lb 12.8 oz (70.7 kg)     He was last seen for hypertension 10 days ago.  BP at that visit was 102/63. Management since that visit includes benicar  started and continue metoprolol  daily.  He reports excellent compliance with treatment. He is not having side effects.  Patient reports that he has been off balanced lately.    Outside blood pressures are 100-156/69-103. This morning was 144/85. Symptoms: No chest pain No chest pressure  No palpitations No syncope  No dyspnea No orthopnea   Pertinent labs Lab Results  Component Value Date   CHOL 175 08/25/2017   HDL 26 (L) 08/25/2017   LDLCALC 113 (H) 08/25/2017   TRIG 180 (H) 08/25/2017   Lab Results  Component Value Date   NA 138 06/18/2022   K 3.9 06/18/2022   CREATININE 0.73 (L) 06/18/2022   EGFR 102 06/18/2022   GLUCOSE 108 (H) 06/18/2022   TSH 1.610 12/18/2021     The ASCVD Risk score (Arnett DK, et al., 2019) failed to calculate for the following reasons:   Cannot find a previous HDL lab   Cannot find a previous total cholesterol lab  --------------------------------------------------------------------------------------------------- Balance  concerns  Has been bothering him for several weeks Reports that he has felt like he may fall several times due to his balance Denies that he experiences the sensation that the room is spinning  His issues with balance occur whether he is sitting or standing  Medications: Outpatient Medications Prior to Visit  Medication Sig   baclofen (LIORESAL) 10 MG tablet Take 10 mg by mouth 4 (four) times daily. STARTING AT LUNCHTIME   fentaNYL (DURAGESIC - DOSED MCG/HR) 25 MCG/HR patch Place 1 patch onto the skin every 3 (three) days.   meloxicam (MOBIC) 15 MG tablet Take 1 tablet (15 mg total) by mouth daily as needed for pain.   metoprolol succinate (TOPROL-XL) 50 MG 24 hr tablet Take with or immediately following a meal.   omeprazole (PRILOSEC) 40 MG capsule Take 1 capsule (40 mg total) by mouth daily.   oxyCODONE-acetaminophen (PERCOCET) 10-325 MG tablet Take 1 tablet by mouth every 4 (four) hours. 3AM, 7AM, 11AM, 3 PM, 7PM AND 11PM   sucralfate (CARAFATE) 1 g tablet TAKE 1 TABLET BY MOUTH 4 TIMES A DAY WITH MEALS AND AT BEDTIME   TRELEGY ELLIPTA 100-62.5-25 MCG/ACT AEPB INHALE 1 PUFF BY MOUTH INTO THE LUNGS DAILY   [DISCONTINUED] desvenlafaxine (PRISTIQ) 100 MG 24 hr tablet Take 1 tablet (100 mg total) by mouth daily.   [DISCONTINUED] olmesartan (BENICAR) 5 MG tablet Take 1 tablet (5 mg total) by mouth daily.   [DISCONTINUED] prochlorperazine (COMPAZINE) 10 MG tablet Take 1 tablet (10 mg  total) by mouth every 8 (eight) hours as needed.   [DISCONTINUED] promethazine (PHENERGAN) 25 MG tablet Take 1 tablet (25 mg total) by mouth every 8 (eight) hours as needed.   [DISCONTINUED] traZODone (DESYREL) 50 MG tablet Take 0.5 tablets (25 mg total) by mouth at bedtime as needed for sleep.   topiramate (TOPAMAX) 50 MG tablet Take 50 mg by mouth 2 (two) times daily.   No facility-administered medications prior to visit.    Review of Systems     Objective    BP (!) 142/79 (BP Location: Left Arm, Patient  Position: Sitting, Cuff Size: Normal)   Pulse 61   Temp 98.7 F (37.1 C) (Oral)   Resp 16   Wt 157 lb 8 oz (71.4 kg)   BMI 24.67 kg/m    Physical Exam Vitals reviewed.  Constitutional:      General: He is not in acute distress.    Appearance: Normal appearance. He is not ill-appearing, toxic-appearing or diaphoretic.  Eyes:     Conjunctiva/sclera: Conjunctivae normal.  Cardiovascular:     Rate and Rhythm: Normal rate and regular rhythm.     Pulses: Normal pulses.     Heart sounds: Normal heart sounds. No murmur heard.    No friction rub. No gallop.  Pulmonary:     Effort: Pulmonary effort is normal. No respiratory distress.     Breath sounds: Normal breath sounds. No stridor. No wheezing, rhonchi or rales.  Abdominal:     General: Bowel sounds are normal. There is no distension.     Palpations: Abdomen is soft.     Tenderness: There is no abdominal tenderness.  Musculoskeletal:     Right lower leg: No edema.     Left lower leg: No edema.  Skin:    Findings: No erythema or rash.  Neurological:     Mental Status: He is alert and oriented to person, place, and time.     Cranial Nerves: Cranial nerves 2-12 are intact. No cranial nerve deficit or facial asymmetry.     Motor: No abnormal muscle tone or pronator drift.     Coordination: Finger-Nose-Finger Test abnormal. Heel to Shin Test normal.     Gait: Tandem walk abnormal.     Comments: Patient unable to perform rapid finger movement      No results found for any visits on 08/19/22.  Assessment & Plan     Problem List Items Addressed This Visit       Cardiovascular and Mediastinum   HTN, goal below 130/80 - Primary    Chronic  BP improved but still not at goal  Recommended increasing benicar to  daily along with continuing metoprolol   Reviewed home BP logs, see photo attach in media   Patient will report any adverse effects at  dose of benicar and reduce back to  if this occurs, patient voiced  understanding       Relevant Medications   olmesartan (BENICAR) 5 MG tablet     Other   Clinical depression    Chronic Stable Refills provided for Pristiq 100 mg daily       Relevant Medications   desvenlafaxine (PRISTIQ) 100 MG 24 hr tablet   traZODone (DESYREL) 50 MG tablet   Chronic nausea    Refills provided for medications  Continue Phenergan 25 mg, Compazine 10 mg      Relevant Medications   prochlorperazine (COMPAZINE) 10 MG tablet   promethazine (PHENERGAN) 25 MG tablet   Mild episode  of recurrent major depressive disorder   Relevant Medications   desvenlafaxine (PRISTIQ) 100 MG 24 hr tablet   traZODone (DESYREL) 50 MG tablet   Balance problem    Evidence of some problems with coordination based on patient's exam today and inability to complete tandem walk or rapid finger movement Recommended physical therapy referral for balance concerns, patient was in agreement with plan      Relevant Orders   Ambulatory referral to Physical Therapy   Other Visit Diagnoses     Primary hypertension       Relevant Medications   olmesartan (BENICAR) 5 MG tablet        Return in about 1 month (around 09/18/2022) for balance.        The entirety of the information documented in the History of Present Illness, Review of Systems and Physical Exam were personally obtained by me. Portions of this information were initially documented by Hetty Ely, CMA . I, [name], MD have reviewed the documentation above for thoroughness and accuracy.      [name], MD  [facility] [phone] (phone) [phone] (fax)  [facility]

## 2022-08-19 NOTE — Assessment & Plan Note (Signed)
Chronic  BP improved but still not at goal  Recommended increasing benicar to  daily along with continuing metoprolol   Reviewed home BP logs, see photo attach in media   Patient will report any adverse effects at  dose of benicar and reduce back to  if this occurs, patient voiced understanding

## 2022-08-19 NOTE — Assessment & Plan Note (Signed)
Refills provided for medications  Continue Phenergan 25 mg, Compazine 10 mg

## 2022-08-27 ENCOUNTER — Telehealth: Payer: Self-pay | Admitting: Family Medicine

## 2022-08-27 DIAGNOSIS — F5101 Primary insomnia: Secondary | ICD-10-CM

## 2022-08-27 MED ORDER — TRAZODONE HCL 50 MG PO TABS
25.0000 mg | ORAL_TABLET | Freq: Every evening | ORAL | 0 refills | Status: DC | PRN
Start: 2022-08-27 — End: 2022-09-23

## 2022-08-27 NOTE — Telephone Encounter (Signed)
Pt is calling back wanting to see if someone will be able to call  [facility] - [place], [place] - [address] Phone: [phone]  Fax: [phone]      Per pt the pharmacy can explain the situation better then he can.

## 2022-08-27 NOTE — Telephone Encounter (Signed)
Pt called in about going from 1/2 pill to a whole pill with traZODone (DESYREL) 50 MG tablet , but says insurance wont cover it , thinking he has a 60 day supply but system show 30 supply. Please call back to discuss.

## 2022-08-27 NOTE — Telephone Encounter (Signed)
Contacted pharmacy. Sent in updated prescription for patient to take one half tablet to one whole tablet nightly at bedtime as needed.  #30 tabs, no refills.   [name], MD  [facility]

## 2022-08-27 NOTE — Telephone Encounter (Signed)
Please Review and advise 

## 2022-08-29 ENCOUNTER — Encounter: Payer: Self-pay | Admitting: Pulmonary Disease

## 2022-09-01 DIAGNOSIS — M961 Postlaminectomy syndrome, not elsewhere classified: Secondary | ICD-10-CM | POA: Diagnosis not present

## 2022-09-01 DIAGNOSIS — G894 Chronic pain syndrome: Secondary | ICD-10-CM | POA: Diagnosis not present

## 2022-09-01 DIAGNOSIS — M47817 Spondylosis without myelopathy or radiculopathy, lumbosacral region: Secondary | ICD-10-CM | POA: Diagnosis not present

## 2022-09-01 DIAGNOSIS — M47812 Spondylosis without myelopathy or radiculopathy, cervical region: Secondary | ICD-10-CM | POA: Diagnosis not present

## 2022-09-18 ENCOUNTER — Ambulatory Visit (INDEPENDENT_AMBULATORY_CARE_PROVIDER_SITE_OTHER): Payer: Medicare HMO | Admitting: Family Medicine

## 2022-09-18 ENCOUNTER — Encounter: Payer: Self-pay | Admitting: Family Medicine

## 2022-09-18 VITALS — BP 114/67 | HR 58 | Temp 99.1°F | Resp 13 | Ht 69.0 in | Wt 162.8 lb

## 2022-09-18 DIAGNOSIS — G8929 Other chronic pain: Secondary | ICD-10-CM

## 2022-09-18 DIAGNOSIS — M5412 Radiculopathy, cervical region: Secondary | ICD-10-CM | POA: Diagnosis not present

## 2022-09-18 DIAGNOSIS — R2689 Other abnormalities of gait and mobility: Secondary | ICD-10-CM

## 2022-09-18 DIAGNOSIS — M549 Dorsalgia, unspecified: Secondary | ICD-10-CM | POA: Diagnosis not present

## 2022-09-18 MED ORDER — KETOROLAC TROMETHAMINE 30 MG/ML IJ SOLN
30.0000 mg | Freq: Once | INTRAMUSCULAR | Status: AC
Start: 2022-09-18 — End: 2022-09-18
  Administered 2022-09-18: 30 mg via INTRAMUSCULAR

## 2022-09-18 NOTE — Assessment & Plan Note (Signed)
Chronic pain, recently flared  Toradol injection given today, 30mg  IM, patient tolerated well  Continues to follow with pain management

## 2022-09-18 NOTE — Assessment & Plan Note (Addendum)
This is ongoing issue  Will collect vitamin B12 and folate along with CBC and metabolic panel  Patient will contact PT to reschedule to begin therapeutic sessions to help with balance  Patient will continue his MV  If no improvement with therapy, patient may benefit from neurology referral

## 2022-09-18 NOTE — Progress Notes (Signed)
I,Joshua  Guerrero,acting as a Neurosurgeon for [employer], MD.,have documented all relevant documentation on the behalf of Joshua Ramp, MD,as directed by  Joshua Ramp, MD while in the presence of Joshua Ramp, MD.    Established patient visit   Patient: Joshua Guerrero   DOB: [birth year]   67 y.o. Male  MRN: [id number] Visit Date: 09/18/2022  Today's healthcare provider: [name], MD   Chief Complaint  Patient presents with   Follow-up   Subjective    HPI  Follow up for Abnormal Balance  Patient presents for 1 month follow up for abnormal balance He has been observed to be off balance with tandem gait testing and have some difficulties with rapid finger movement  Patient was referred for PT one month ago  Today he states balance has not changed it is the same.  He has been contacted to set up the appointment for PT but has not been able to schedule yet due to increased joint pain during the rainy weather  Denies vertigo symptoms with dizziness  Reports that he just feels unsteady on his feet most of the time as if he needs to hold onto something to walk safely because by the time her realizes he is not steady and may fall, it's too late  He denies feeling lightheaded currently   Medications: Outpatient Medications Prior to Visit  Medication Sig   baclofen (LIORESAL) 10 MG tablet Take 10 mg by mouth 4 (four) times daily. STARTING AT LUNCHTIME   desvenlafaxine (PRISTIQ) 100 MG 24 hr tablet Take 1 tablet (100 mg total) by mouth daily.   fentaNYL (DURAGESIC - DOSED MCG/HR) 25 MCG/HR patch Place 1 patch onto the skin every 3 (three) days.   meloxicam (MOBIC) 15 MG tablet Take 1 tablet (15 mg total) by mouth daily as needed for pain.   metoprolol succinate (TOPROL-XL) 50 MG 24 hr tablet Take with or immediately following a meal.   olmesartan (BENICAR) 5 MG tablet Take 2 tablets (10 mg total) by mouth daily.   omeprazole  (PRILOSEC) 40 MG capsule Take 1 capsule (40 mg total) by mouth daily.   oxyCODONE-acetaminophen (PERCOCET) 10-325 MG tablet Take 1 tablet by mouth every 4 (four) hours. 3AM, 7AM, 11AM, 3 PM, 7PM AND 11PM   prochlorperazine (COMPAZINE) 10 MG tablet Take 1 tablet (10 mg total) by mouth every 8 (eight) hours as needed.   promethazine (PHENERGAN) 25 MG tablet Take 1 tablet (25 mg total) by mouth every 8 (eight) hours as needed.   sucralfate (CARAFATE) 1 g tablet TAKE 1 TABLET BY MOUTH 4 TIMES A DAY WITH MEALS AND AT BEDTIME   traZODone (DESYREL) 50 MG tablet Take 0.5-1 tablets (25-50 mg total) by mouth at bedtime as needed for sleep.   TRELEGY ELLIPTA 100-62.5-25 MCG/ACT AEPB INHALE 1 PUFF BY MOUTH INTO THE LUNGS DAILY   No facility-administered medications prior to visit.    Review of Systems  All other systems reviewed and are negative.      Objective    BP 114/67 (BP Location: Left Arm, Patient Position: Sitting, Cuff Size: Normal)   Pulse (!) 58   Temp 99.1 F (37.3 C) (Oral)   Resp 13   Ht 5\' 9"  (1.753 m)   Wt 162 lb 12.8 oz (73.8 kg)   SpO2 97%   BMI 24.04 kg/m    Physical Exam Vitals reviewed.  Constitutional:      General: He is not in acute distress.  Appearance: Normal appearance. He is not ill-appearing, toxic-appearing or diaphoretic.  Eyes:     Conjunctiva/sclera: Conjunctivae normal.  Cardiovascular:     Rate and Rhythm: Normal rate and regular rhythm.     Pulses: Normal pulses.     Heart sounds: Normal heart sounds. No murmur heard.    No friction rub. No gallop.  Pulmonary:     Effort: Pulmonary effort is normal. No respiratory distress.     Breath sounds: Normal breath sounds. No stridor. No wheezing, rhonchi or rales.  Abdominal:     General: Bowel sounds are normal. There is no distension.     Palpations: Abdomen is soft.     Tenderness: There is no abdominal tenderness.  Skin:    Findings: No erythema or rash.  Neurological:     Mental Status: He  is alert and oriented to person, place, and time.     Cranial Nerves: Cranial nerves 2-12 are intact. No cranial nerve deficit, dysarthria or facial asymmetry.     Motor: Motor function is intact. No weakness, tremor, atrophy, abnormal muscle tone or pronator drift.     Gait: Gait abnormal and tandem walk abnormal.     Comments: Dizziness with turning around  Patient has no muscle rigidity         No results found for any visits on 09/18/22.  Assessment & Plan     Problem List Items Addressed This Visit       Nervous and Auditory   Cervical nerve root disorder    Chronic pain, recently flared  Toradol injection given today, 30mg  IM, patient tolerated well  Continues to follow with pain management         Other   Back pain, chronic   Balance problem - Primary    This is ongoing issue  Will collect vitamin B12 and folate along with CBC and metabolic panel  Patient will contact PT to reschedule to begin therapeutic sessions to help with balance  Patient will continue his MV  If no improvement with therapy, patient may benefit from neurology referral        Relevant Orders   Basic Metabolic Panel (BMET)   CBC   W29 and Folate Panel     Return in about 4 months (around 01/19/2023) for HTN, DEPRESSION.        The entirety of the information documented in the History of Present Illness, Review of Systems and Physical Exam were personally obtained by me. Portions of this information were initially documented by Lubertha Basque, CMA  . I, Joshua Ramp, MD have reviewed the documentation above for thoroughness and accuracy.   Joshua Ramp, MD  [facility] [phone] (phone) [phone] (fax)  [facility]

## 2022-09-19 LAB — BASIC METABOLIC PANEL
BUN/Creatinine Ratio: 21 (ref 10–24)
BUN: 17 mg/dL (ref 8–27)
CO2: 26 mmol/L (ref 20–29)
Calcium: 9 mg/dL (ref 8.6–10.2)
Chloride: 104 mmol/L (ref 96–106)
Creatinine, Ser: 0.81 mg/dL (ref 0.76–1.27)
Glucose: 91 mg/dL (ref 70–99)
Potassium: 4.8 mmol/L (ref 3.5–5.2)
Sodium: 142 mmol/L (ref 134–144)
eGFR: 98 mL/min/{1.73_m2} (ref >59)

## 2022-09-19 LAB — CBC
Hematocrit: 41.9 % (ref 37.5–51.0)
Hemoglobin: 14 g/dL (ref 13.0–17.7)
MCH: 31.4 pg (ref 26.6–33.0)
MCHC: 33.4 g/dL (ref 31.5–35.7)
MCV: 94 fL (ref 79–97)
Platelets: 324 10*3/uL (ref 150–450)
RBC: 4.46 x10E6/uL (ref 4.14–5.80)
RDW: 14 % (ref 11.6–15.4)
WBC: 11.3 10*3/uL — ABNORMAL HIGH (ref 3.4–10.8)

## 2022-09-19 LAB — B12 AND FOLATE PANEL
Folate: 4.3 ng/mL (ref >3.0)
Vitamin B-12: 1672 pg/mL — ABNORMAL HIGH (ref 232–1245)

## 2022-09-23 ENCOUNTER — Other Ambulatory Visit: Payer: Self-pay | Admitting: Pulmonary Disease

## 2022-09-23 ENCOUNTER — Other Ambulatory Visit: Payer: Self-pay | Admitting: Family Medicine

## 2022-09-23 DIAGNOSIS — F5101 Primary insomnia: Secondary | ICD-10-CM

## 2022-09-23 NOTE — Telephone Encounter (Signed)
Requested Prescriptions  Pending Prescriptions Disp Refills   traZODone (DESYREL) 50 MG tablet [Pharmacy Med Name: TRAZODONE HCL 50 MG TAB] 30 tablet 0    Sig: TAKE 1/2 TO 1 TABLET BY MOUTH AT [facility] NEEDED FOR SLEEP     Psychiatry: Antidepressants - Serotonin Modulator Passed - 09/23/2022  9:53 AM      Passed - Completed PHQ-2 or PHQ-9 in the last 360 days      Passed - Valid encounter within last 6 months    Recent Outpatient Visits           5 days ago Balance problem   [place] [facility] Simmons-Robinson, [place], MD   1 month ago HTN, goal below 130/80   [place] [facility] Simmons-Robinson, [place], MD   1 month ago Chronic neck and back pain   [place] [facility] Simmons-Robinson, [place], MD   1 month ago Primary insomnia   [place] [facility] Simmons-Robinson, [place], MD   2 months ago Primary hypertension    [facility] Simmons-Robinson, [place], MD       Future Appointments             In 3 months Simmons-Robinson, Tawanna Cooler, MD [facility]

## 2022-09-30 DIAGNOSIS — M961 Postlaminectomy syndrome, not elsewhere classified: Secondary | ICD-10-CM | POA: Diagnosis not present

## 2022-09-30 DIAGNOSIS — M47817 Spondylosis without myelopathy or radiculopathy, lumbosacral region: Secondary | ICD-10-CM | POA: Diagnosis not present

## 2022-09-30 DIAGNOSIS — G894 Chronic pain syndrome: Secondary | ICD-10-CM | POA: Diagnosis not present

## 2022-09-30 DIAGNOSIS — M47812 Spondylosis without myelopathy or radiculopathy, cervical region: Secondary | ICD-10-CM | POA: Diagnosis not present

## 2022-10-07 ENCOUNTER — Encounter: Payer: Self-pay | Admitting: Physical Medicine and Rehabilitation

## 2022-10-15 ENCOUNTER — Telehealth: Payer: Self-pay | Admitting: Family Medicine

## 2022-10-15 NOTE — Telephone Encounter (Signed)
-----   Message from Unknown Foley sent at 10/13/2022  3:28 PM EDT ----- Please review incoming fax

## 2022-10-15 NOTE — Telephone Encounter (Signed)
PA started today through covermymeds.  The patient currently has access to the requested medication and a Prior Authorization is not needed for the patient/medication.

## 2022-10-15 NOTE — Telephone Encounter (Signed)
Covermymeds requesting prior authorization' [id number] Name: Blue Winther Succinate ER 100MG  ER Tablets

## 2022-10-15 NOTE — Telephone Encounter (Signed)
-----   Message from Nicole G Curtis sent at 10/13/2022  3:28 PM EDT ----- Please review incoming fax  

## 2022-10-19 NOTE — Telephone Encounter (Signed)
Pharmacy notified.

## 2022-10-27 ENCOUNTER — Ambulatory Visit: Payer: Medicare HMO | Admitting: Family Medicine

## 2022-10-29 DIAGNOSIS — M47817 Spondylosis without myelopathy or radiculopathy, lumbosacral region: Secondary | ICD-10-CM | POA: Diagnosis not present

## 2022-10-29 DIAGNOSIS — M961 Postlaminectomy syndrome, not elsewhere classified: Secondary | ICD-10-CM | POA: Diagnosis not present

## 2022-10-29 DIAGNOSIS — G894 Chronic pain syndrome: Secondary | ICD-10-CM | POA: Diagnosis not present

## 2022-10-29 DIAGNOSIS — M47812 Spondylosis without myelopathy or radiculopathy, cervical region: Secondary | ICD-10-CM | POA: Diagnosis not present

## 2022-11-03 NOTE — Progress Notes (Unsigned)
      Established patient visit   Patient: Joshua Guerrero   DOB: [birth year]   67 y.o. Male  MRN: [id number] Visit Date: 11/04/2022  Today's healthcare provider: [name], MD   No chief complaint on file.  Subjective      Congestion  ***   Medications: Outpatient Medications Prior to Visit  Medication Sig   baclofen (LIORESAL) 10 MG tablet Take 10 mg by mouth 4 (four) times daily. STARTING AT LUNCHTIME   desvenlafaxine (PRISTIQ) 100 MG 24 hr tablet Take 1 tablet (100 mg total) by mouth daily.   fentaNYL (DURAGESIC - DOSED MCG/HR) 25 MCG/HR patch Place 1 patch onto the skin every 3 (three) days.   meloxicam (MOBIC) 15 MG tablet Take 1 tablet (15 mg total) by mouth daily as needed for pain.   metoprolol succinate (TOPROL-XL) 50 MG 24 hr tablet Take with or immediately following a meal.   olmesartan (BENICAR) 5 MG tablet Take 2 tablets (10 mg total) by mouth daily.   omeprazole (PRILOSEC) 40 MG capsule Take 1 capsule (40 mg total) by mouth daily.   oxyCODONE-acetaminophen (PERCOCET) 10-325 MG tablet Take 1 tablet by mouth every 4 (four) hours. 3AM, 7AM, 11AM, 3 PM, 7PM AND 11PM   prochlorperazine (COMPAZINE) 10 MG tablet Take 1 tablet (10 mg total) by mouth every 8 (eight) hours as needed.   promethazine (PHENERGAN) 25 MG tablet Take 1 tablet (25 mg total) by mouth every 8 (eight) hours as needed.   sucralfate (CARAFATE) 1 g tablet TAKE 1 TABLET BY MOUTH 4 TIMES A DAY WITH MEALS AND AT BEDTIME   traZODone (DESYREL) 50 MG tablet TAKE 1/2 TO 1 TABLET BY MOUTH AT BEDTIMEAS NEEDED FOR SLEEP   TRELEGY ELLIPTA 100-62.5-25 MCG/ACT AEPB INHALE 1 PUFF BY MOUTH INTO THE LUNGS DAILY   No facility-administered medications prior to visit.    Review of Systems  {Labs  Heme  Chem  Endocrine  Serology  Results Review (optional):23779}   Objective    There were no vitals taken for this visit. {Show previous vital signs (optional):23777}  Physical Exam  ***  No results  found for any visits on 11/04/22.  Assessment & Plan     Problem List Items Addressed This Visit   None    No follow-ups on file.         [name], MD  [facility] [phone] (phone) [phone] (fax)  [facility]

## 2022-11-04 ENCOUNTER — Ambulatory Visit
Admission: RE | Admit: 2022-11-04 | Discharge: 2022-11-04 | Disposition: A | Discharge status: Home / Self Care | Payer: Medicare HMO | Source: Ambulatory Visit | Attending: Family Medicine | Admitting: Family Medicine

## 2022-11-04 ENCOUNTER — Ambulatory Visit (INDEPENDENT_AMBULATORY_CARE_PROVIDER_SITE_OTHER): Payer: Medicare HMO | Admitting: Family Medicine

## 2022-11-04 ENCOUNTER — Ambulatory Visit
Admission: RE | Admit: 2022-11-04 | Discharge: 2022-11-04 | Disposition: A | Discharge status: Home / Self Care | Payer: Medicare HMO | Attending: Family Medicine | Admitting: Family Medicine

## 2022-11-04 ENCOUNTER — Encounter: Payer: Self-pay | Admitting: Family Medicine

## 2022-11-04 VITALS — BP 92/57 | HR 56 | Temp 97.7°F | Resp 16 | Ht 69.0 in | Wt 154.4 lb

## 2022-11-04 DIAGNOSIS — F5101 Primary insomnia: Secondary | ICD-10-CM

## 2022-11-04 DIAGNOSIS — R0989 Other specified symptoms and signs involving the circulatory and respiratory systems: Secondary | ICD-10-CM

## 2022-11-04 DIAGNOSIS — M549 Dorsalgia, unspecified: Secondary | ICD-10-CM

## 2022-11-04 DIAGNOSIS — Z981 Arthrodesis status: Secondary | ICD-10-CM | POA: Diagnosis not present

## 2022-11-04 DIAGNOSIS — G8929 Other chronic pain: Secondary | ICD-10-CM | POA: Diagnosis not present

## 2022-11-04 DIAGNOSIS — R059 Cough, unspecified: Secondary | ICD-10-CM | POA: Diagnosis not present

## 2022-11-04 MED ORDER — KETOROLAC TROMETHAMINE 60 MG/2ML IM SOLN
30.0000 mg | Freq: Once | INTRAMUSCULAR | Status: AC
Start: 2022-11-04 — End: 2022-11-04
  Administered 2022-11-04: 30 mg via INTRAMUSCULAR

## 2022-11-04 MED ORDER — TRAZODONE HCL 100 MG PO TABS: 100.0000 mg | ORAL_TABLET | Freq: Every day | ORAL | 0 refills | Status: DC

## 2022-11-04 NOTE — Assessment & Plan Note (Signed)
Chronic Patient continues to report difficulty with sleeping Recommended against using benzodiazepines given his complex pain management routine Will increase trazodone to 100 mg as needed at bedtime

## 2022-11-04 NOTE — Patient Instructions (Addendum)
Please report to [facility] located at:  [address]  [place], [place] [address]  You do not need an appointment to have xrays completed.   Our office will follow up with  results once available.      

## 2022-11-04 NOTE — Assessment & Plan Note (Signed)
Chronic Stable Patient will continue current controlled substance medications as managed by pain management Toradol injection, 30 mg given today Patient tolerated well

## 2022-11-04 NOTE — Assessment & Plan Note (Signed)
Chronic problem States that symptoms are stable and not getting worse nor improving Reports productive cough without fever Recommended patient use Trelegy inhaler as prescribed on a regular daily basis, patient voiced understanding Will order chest x-ray to evaluate for any pneumonia

## 2022-11-11 ENCOUNTER — Ambulatory Visit: Payer: Self-pay | Admitting: *Deleted

## 2022-11-11 NOTE — Telephone Encounter (Signed)
  Chief Complaint: low BP readings Symptoms: low BP reading- no other symptoms- patient states he did not fell well this morning- he has been working in the heat- but states he is hydrated and not dizzy or weak.  Frequency: today Pertinent Negatives: Patient denies illness, injury, other symptoms Disposition: [] ED /[] Urgent Care (no appt availability in office) / [x] Appointment(In office/virtual)/ []  [facility]/ [] Home Care/ [] Refused Recommended Disposition /[] [place] [facility]/ []  Follow-up with PCP Additional Notes: Appointment has been scheduled for BP chack, patient advised if he gets worse- UC/ED- he states he will not go there- but will call office back.

## 2022-11-11 NOTE — Telephone Encounter (Signed)
Reason for Disposition  [1] Systolic BP 90-110 AND [2] taking blood pressure medications AND [3] NOT dizzy, lightheaded or weak  Answer Assessment - Initial Assessment Questions 1. BLOOD PRESSURE: "What is the blood pressure?" "Did you take at least two measurements 5 minutes apart?"     93/60, 90/60 P 80 2. ONSET: "When did you take your blood pressure?"     Today- 2:15 3. HOW: "How did you obtain the blood pressure?" (e.g., visiting nurse, automatic home BP monitor)     Automatic cuff- wrist 4. HISTORY: "Do you have a history of low blood pressure?" "What is your blood pressure normally?"     No- normally- high, 120/70-80 5. MEDICINES: "Are you taking any medications for blood pressure?" If Yes, ask: "Have they been changed recently?"     Yes- metoprolol and  olmesartan- no changes recently 6. PULSE RATE: "Do you know what your pulse rate is?"      80 7. OTHER SYMPTOMS: "Have you been sick recently?" "Have you had a recent injury?"     no  Protocols used: Blood Pressure - Low-A-AH

## 2022-11-12 ENCOUNTER — Encounter: Payer: Self-pay | Admitting: Physician Assistant

## 2022-11-12 ENCOUNTER — Ambulatory Visit (INDEPENDENT_AMBULATORY_CARE_PROVIDER_SITE_OTHER): Payer: Medicare HMO | Admitting: Physician Assistant

## 2022-11-12 VITALS — BP 105/75 | HR 66 | Temp 97.4°F | Ht 69.0 in | Wt 151.0 lb

## 2022-11-12 DIAGNOSIS — I10 Essential (primary) hypertension: Secondary | ICD-10-CM

## 2022-11-12 NOTE — Progress Notes (Signed)
Established patient visit  Patient: Joshua Guerrero   DOB: 10/07/1955   67 y.o. Male  MRN: [id number] Visit Date: 11/12/2022  Today's healthcare provider: Debera Lat, PA-C   Chief Complaint  Patient presents with   Hypotension       Subjective     HPI     Hypotension    Additional comments: Patient has been having low blood pressure for a few weeks.  He is still taking his Benicar and Toprol.  He denies at any time to be symptomatic.  He also does not think this is associated with dehydration as he reports drinking 8-10 bottles of water daily.      Last edited by Adline Peals, CMA on 11/12/2022 10:53 AM.     Pt reports that his BP today when he wore up was 150 and decreased to 119 after he took his BP medications. Pt takes Benicar BID and measures his BP at home three times a day.  He drinks enough water but eats only BID.  She is a current day smoker and smokes up to 1/2 pack a day.       09/18/2022    1:51 PM 05/29/2022    1:34 PM 05/28/2022    1:14 PM  Depression screen PHQ 2/9  Decreased Interest 0 0 0  Down, Depressed, Hopeless 0 0 1  PHQ - 2 Score 0 0 1  Altered sleeping 0 0   Tired, decreased energy 0 0   Change in appetite 0 0   Feeling bad or failure about yourself  0 0   Trouble concentrating 0 0   Moving slowly or fidgety/restless 0 0   Suicidal thoughts 0 0   PHQ-9 Score 0 0   Difficult doing work/chores Not difficult at all Not difficult at all     Medications: Outpatient Medications Prior to Visit  Medication Sig   albuterol (VENTOLIN HFA) 108 (90 Base) MCG/ACT inhaler Inhale 2 puffs into the lungs every 4 (four) hours as needed for wheezing or shortness of breath.   baclofen (LIORESAL) 10 MG tablet Take 10 mg by mouth 4 (four) times daily. STARTING AT LUNCHTIME   desvenlafaxine (PRISTIQ) 100 MG 24 hr tablet Take 1 tablet (100 mg total) by mouth daily.   fentaNYL (DURAGESIC - DOSED MCG/HR) 25 MCG/HR patch Place 1 patch onto the skin every 3  (three) days.   meloxicam (MOBIC) 15 MG tablet Take 1 tablet (15 mg total) by mouth daily as needed for pain.   metoprolol succinate (TOPROL-XL) 50 MG 24 hr tablet Take with or immediately following a meal.   NARCAN 4 MG/0.1ML LIQD nasal spray kit Place 1 spray into the nose once.   olmesartan (BENICAR) 5 MG tablet Take 2 tablets (10 mg total) by mouth daily.   omeprazole (PRILOSEC) 40 MG capsule Take 1 capsule (40 mg total) by mouth daily.   oxyCODONE-acetaminophen (PERCOCET) 10-325 MG tablet Take 1 tablet by mouth every 4 (four) hours. 3AM, 7AM, 11AM, 3 PM, 7PM AND 11PM   prochlorperazine (COMPAZINE) 10 MG tablet Take 1 tablet (10 mg total) by mouth every 8 (eight) hours as needed.   promethazine (PHENERGAN) 25 MG tablet Take 1 tablet (25 mg total) by mouth every 8 (eight) hours as needed.   sucralfate (CARAFATE) 1 g tablet TAKE 1 TABLET BY MOUTH 4 TIMES A DAY WITH MEALS AND AT BEDTIME   traZODone (DESYREL) 100 MG tablet Take 1 tablet (100 mg total) by mouth at bedtime.  TRELEGY ELLIPTA 100-62.5-25 MCG/ACT AEPB INHALE 1 PUFF BY MOUTH INTO THE LUNGS DAILY   No facility-administered medications prior to visit.    Review of Systems  All other systems reviewed and are negative.  Except see HPI      Objective    BP 105/75 (BP Location: Left Arm, Patient Position: Sitting, Cuff Size: Normal)   Pulse 66   Temp (!) 97.4 F (36.3 C) (Oral)   Ht 5\' 9"  (1.753 m)   Wt 151 lb (68.5 kg)   SpO2 100%   BMI 22.30 kg/m    Physical Exam Vitals reviewed.  Constitutional:      General: He is not in acute distress.    Appearance: Normal appearance. He is not diaphoretic.  HENT:     Head: Normocephalic and atraumatic.  Eyes:     General: No scleral icterus.    Conjunctiva/sclera: Conjunctivae normal.  Cardiovascular:     Rate and Rhythm: Normal rate and regular rhythm.     Pulses: Normal pulses.     Heart sounds: Normal heart sounds. No murmur heard. Pulmonary:     Effort: Pulmonary  effort is normal. No respiratory distress.     Breath sounds: Normal breath sounds. No wheezing or rhonchi.  Musculoskeletal:     Cervical back: Neck supple.     Right lower leg: No edema.     Left lower leg: No edema.  Lymphadenopathy:     Cervical: No cervical adenopathy.  Skin:    General: Skin is warm and dry.     Findings: No rash.  Neurological:     Mental Status: He is alert and oriented to person, place, and time. Mental status is at baseline.  Psychiatric:        Behavior: Behavior normal.        Thought Content: Thought content normal.        Judgment: Judgment normal.      No results found for any visits on 11/12/22.  Assessment & Plan    1. Primary hypertension Chronic and previously stable Bp today WNL but per pat, it has been in low numbers for the past two-three readings: 90/60 Pt was reassured but advised to see [place] for reassessment in 2 weeks. Most recent labs were on 09/18/22 WNL, per PCP assessment  Advised to measure his BP at home, record all his BP levels and bring his device and BP log with him to the next appt. Advised to continue a proper hydration, to attempt to eat as smaller portion but more frequently.  Return in about 2 weeks (around 11/26/2022) for BP f/u.     The patient was advised to call back or seek an in-person evaluation if the symptoms worsen or if the condition fails to improve as anticipated.  I discussed the assessment and treatment plan with the patient. The patient was provided an opportunity to ask questions and all were answered. The patient agreed with the plan and demonstrated an understanding of the instructions.  I, Debera Lat, PA-C have reviewed all documentation for this visit. The documentation on  11/12/22 for the exam, diagnosis, procedures, and orders are all accurate and complete.  Debera Lat, [facility], MMS [facility] [phone] (phone) [phone] (fax)  [facility]

## 2022-11-24 DIAGNOSIS — M47812 Spondylosis without myelopathy or radiculopathy, cervical region: Secondary | ICD-10-CM | POA: Diagnosis not present

## 2022-11-24 DIAGNOSIS — M47817 Spondylosis without myelopathy or radiculopathy, lumbosacral region: Secondary | ICD-10-CM | POA: Diagnosis not present

## 2022-11-24 DIAGNOSIS — M961 Postlaminectomy syndrome, not elsewhere classified: Secondary | ICD-10-CM | POA: Diagnosis not present

## 2022-11-24 DIAGNOSIS — G894 Chronic pain syndrome: Secondary | ICD-10-CM | POA: Diagnosis not present

## 2022-11-28 ENCOUNTER — Other Ambulatory Visit: Payer: Self-pay | Admitting: Family Medicine

## 2022-11-28 DIAGNOSIS — K219 Gastro-esophageal reflux disease without esophagitis: Secondary | ICD-10-CM

## 2022-11-30 NOTE — Telephone Encounter (Signed)
Requested Prescriptions  Pending Prescriptions Disp Refills   omeprazole (PRILOSEC) 40 MG capsule [Pharmacy Med Name: OMEPRAZOLE CAP 40MG ] 30 capsule 3    Sig: TAKE 1 CAPSULE DAILY     Gastroenterology: Proton Pump Inhibitors Passed - 11/28/2022  2:20 AM      Passed - Valid encounter within last 12 months    Recent Outpatient Visits           2 weeks ago Primary hypertension   [place] [facility] [place], [place], PA-C   3 weeks ago Chest congestion   [place] [facility] [place], [place], MD   2 months ago Balance problem   [place] [facility] [place], [place], MD   3 months ago HTN, goal below 130/80   [place] [place] [facility] Simmons-Robinson, [place], MD   3 months ago Chronic neck and back pain   [place] [facility] Simmons-Robinson, Tawanna Cooler, MD       Future Appointments             In 3 days Simmons-Robinson, Tawanna Cooler, MD [facility]   In 1 month Simmons-Robinson, [place], MD [facility], [place]

## 2022-12-02 NOTE — Progress Notes (Deleted)
      Established patient visit   Patient: Joshua Guerrero   DOB: Aug 03, 1955   67 y.o. Male  MRN: [id number] Visit Date: 12/03/2022  Today's healthcare provider: [name], MD   No chief complaint on file.  Subjective      ***  Medications: Outpatient Medications Prior to Visit  Medication Sig   albuterol (VENTOLIN HFA) 108 (90 Base) MCG/ACT inhaler Inhale 2 puffs into the lungs every 4 (four) hours as needed for wheezing or shortness of breath.   baclofen (LIORESAL) 10 MG tablet Take 10 mg by mouth 4 (four) times daily. STARTING AT LUNCHTIME   desvenlafaxine (PRISTIQ) 100 MG 24 hr tablet Take 1 tablet (100 mg total) by mouth daily.   fentaNYL (DURAGESIC - DOSED MCG/HR) 25 MCG/HR patch Place 1 patch onto the skin every 3 (three) days.   meloxicam (MOBIC) 15 MG tablet Take 1 tablet (15 mg total) by mouth daily as needed for pain.   metoprolol succinate (TOPROL-XL) 50 MG 24 hr tablet Take with or immediately following a meal.   NARCAN 4 MG/0.1ML LIQD nasal spray kit Place 1 spray into the nose once.   olmesartan (BENICAR) 5 MG tablet Take 2 tablets (10 mg total) by mouth daily.   omeprazole (PRILOSEC) 40 MG capsule TAKE 1 CAPSULE DAILY   oxyCODONE-acetaminophen (PERCOCET) 10-325 MG tablet Take 1 tablet by mouth every 4 (four) hours. 3AM, 7AM, 11AM, 3 PM, 7PM AND 11PM   prochlorperazine (COMPAZINE) 10 MG tablet Take 1 tablet (10 mg total) by mouth every 8 (eight) hours as needed.   promethazine (PHENERGAN) 25 MG tablet Take 1 tablet (25 mg total) by mouth every 8 (eight) hours as needed.   sucralfate (CARAFATE) 1 g tablet TAKE 1 TABLET BY MOUTH 4 TIMES A DAY WITH MEALS AND AT BEDTIME   traZODone (DESYREL) 100 MG tablet Take 1 tablet (100 mg total) by mouth at bedtime.   TRELEGY ELLIPTA 100-62.5-25 MCG/ACT AEPB INHALE 1 PUFF BY MOUTH INTO THE LUNGS DAILY   No facility-administered medications prior to visit.    Review of Systems  {Insert previous labs  (optional):23779}  {See past labs  Heme  Chem  Endocrine  Serology  Results Review (optional):1}   Objective    There were no vitals taken for this visit. {Insert last BP/Wt (optional):23777}  {See vitals history (optional):1}  Physical Exam  ***  No results found for any visits on 12/03/22.  Assessment & Plan     Problem List Items Addressed This Visit   None    No follow-ups on file.         [name], MD  [place] [phone] (phone) [phone] (fax)  [facility]

## 2022-12-03 ENCOUNTER — Other Ambulatory Visit: Payer: Self-pay | Admitting: Family Medicine

## 2022-12-03 ENCOUNTER — Ambulatory Visit: Payer: Medicare HMO | Admitting: Family Medicine

## 2022-12-03 DIAGNOSIS — F5101 Primary insomnia: Secondary | ICD-10-CM

## 2022-12-14 ENCOUNTER — Other Ambulatory Visit: Payer: Self-pay | Admitting: Family Medicine

## 2022-12-14 DIAGNOSIS — R110 Nausea: Secondary | ICD-10-CM

## 2022-12-16 ENCOUNTER — Ambulatory Visit: Payer: Self-pay | Admitting: *Deleted

## 2022-12-16 DIAGNOSIS — F5101 Primary insomnia: Secondary | ICD-10-CM

## 2022-12-16 MED ORDER — TRAZODONE HCL 300 MG PO TABS
300.0000 mg | ORAL_TABLET | Freq: Every day | ORAL | 1 refills | Status: DC
Start: 2022-12-16 — End: 2023-02-23

## 2022-12-16 NOTE — Telephone Encounter (Signed)
Prescription for trazodone updated to 300mg  dose at bedtime. Please notify patient   [name], MD  [facility]

## 2022-12-16 NOTE — Telephone Encounter (Signed)
Summary: Requesting a higher dose of medication   Patient called and stated that Dr Roxan Hockey prescribed him some sleeping medication (traZODone (DESYREL) 100 MG tablet) but it is not strong enough, requested the dosage to be higher. Patient stated he has to take 3 tablets before it does anything.   Patients callback # [phone]       Chief Complaint: requesting medication dose increase or another medication prescribed. Taking double amount of medication prescribed Symptoms: trazodone 100 mg x 3 tablets at hs with OTC sleep aid diphenyl dramamine 50 mg tablets x 9 tablets at hs  and ineffective. Patient reports he only sleeps 4-6 hours per night. Has been taking since 11/04/22 last OV with PCP.  Frequency: 11/04/22 Pertinent Negatives: Patient denies side effects next day no drowsiness no other sx reported after taking medications  Disposition: [] ED /[] Urgent Care (no appt availability in office) / [] Appointment(In office/virtual)/ []  [place] [facility]/ [] Home Care/ [] Refused Recommended Disposition /[] [place] [facility]/ [x]  Follow-up with PCP Additional Notes:   Recommended to f/u with PCP for another appt. And patient would like to see what PCP recommended. Patient taking double amount of medication and ineffective. Requesting to take xanax "only thing that has helped in the past to sleep". Reports he did not abuse medication but nothing else if helping to sleep. Please advise.      Reason for Disposition  [1] DOUBLE DOSE (an extra dose or lesser amount) of prescription drug AND [2] NO symptoms  (Exception: A double dose of antibiotics.)  Answer Assessment - Initial Assessment Questions 1. NAME of MEDICINE: "What medicine(s) are you calling about?"     Trazadone 100 mg  2. QUESTION: "What is your question?" (e.g., double dose of medicine, side effect)     Can dose be increased or another medication be ordered for sleep? 3. PRESCRIBER: "Who prescribed the medicine?" Reason:  if prescribed by specialist, call should be referred to that group.     PCP 4. SYMPTOMS: "Do you have any symptoms?" If Yes, ask: "What symptoms are you having?"  "How bad are the symptoms (e.g., mild, moderate, severe)     Trazodone  100 mg - taking 3 tablets at night with OTC sleep aid -Diphenyl dramamine 50 mg tablets taking 9 tablets at night and reports both medications ineffective only sleeps approx 4-6/ hours a night  5. PREGNANCY:  "Is there any chance that you are pregnant?" "When was your last menstrual period?"     na  Protocols used: Medication Question Call-A-AH

## 2022-12-17 NOTE — Telephone Encounter (Signed)
Detailed message left on cell phone per DPR. CRM created. Ok for [facility] to advise if patient returns call

## 2022-12-18 NOTE — Telephone Encounter (Signed)
Patient advised.

## 2022-12-21 ENCOUNTER — Other Ambulatory Visit: Payer: Self-pay | Admitting: Family Medicine

## 2022-12-21 DIAGNOSIS — I10 Essential (primary) hypertension: Secondary | ICD-10-CM

## 2022-12-22 DIAGNOSIS — Z79891 Long term (current) use of opiate analgesic: Secondary | ICD-10-CM | POA: Diagnosis not present

## 2022-12-22 DIAGNOSIS — G894 Chronic pain syndrome: Secondary | ICD-10-CM | POA: Diagnosis not present

## 2022-12-22 DIAGNOSIS — M47812 Spondylosis without myelopathy or radiculopathy, cervical region: Secondary | ICD-10-CM | POA: Diagnosis not present

## 2022-12-22 DIAGNOSIS — M47817 Spondylosis without myelopathy or radiculopathy, lumbosacral region: Secondary | ICD-10-CM | POA: Diagnosis not present

## 2022-12-22 DIAGNOSIS — M961 Postlaminectomy syndrome, not elsewhere classified: Secondary | ICD-10-CM | POA: Diagnosis not present

## 2022-12-29 ENCOUNTER — Telehealth: Payer: Self-pay | Admitting: Family Medicine

## 2022-12-29 ENCOUNTER — Other Ambulatory Visit: Payer: Self-pay | Admitting: Family Medicine

## 2022-12-29 DIAGNOSIS — R110 Nausea: Secondary | ICD-10-CM

## 2022-12-29 DIAGNOSIS — M542 Cervicalgia: Secondary | ICD-10-CM

## 2022-12-29 NOTE — Telephone Encounter (Signed)
Patient called and stated he was scheduled to have an MRI for tomorrow, 12/30/2022 but stated they did not have authorization for this so they cancelled his MRI. Please advise. Please update patient on this.   Patients callback # : [phone]

## 2022-12-29 NOTE — Telephone Encounter (Signed)
The patient has made additional contact to thank the practice for their assistance in the coordination of their imaging   The patient has been made aware that their PCP was not the original ordering doctor  Please contact further when possible

## 2022-12-29 NOTE — Telephone Encounter (Signed)
Medication Refill - Medication:  meloxicam (MOBIC) 15 MG tablet  prochlorperazine (COMPAZINE) 10 MG tablet  promethazine (PHENERGAN) 25 MG tablet  Completely out of all.   Has the patient contacted their pharmacy? Yes.   Stated the pharmacy was supposed to be faxing a request over.  Preferred Pharmacy (with phone number or street name):  [facility] - [place], [place] - [address]  Phone: [phone] Fax: [phone]   Has the patient been seen for an appointment in the last year OR does the patient have an upcoming appointment? Yes, F/U on 01/19/2023

## 2022-12-30 ENCOUNTER — Encounter: Payer: Self-pay | Admitting: Physical Medicine and Rehabilitation

## 2022-12-30 ENCOUNTER — Other Ambulatory Visit: Payer: Medicare HMO

## 2022-12-30 MED ORDER — PROMETHAZINE HCL 25 MG PO TABS
25.0000 mg | ORAL_TABLET | Freq: Three times a day (TID) | ORAL | 1 refills | Status: DC | PRN
Start: 2022-12-30 — End: 2023-04-21

## 2022-12-30 MED ORDER — PROCHLORPERAZINE MALEATE 10 MG PO TABS
10.0000 mg | ORAL_TABLET | Freq: Three times a day (TID) | ORAL | 1 refills | Status: DC | PRN
Start: 2022-12-30 — End: 2023-04-21

## 2022-12-30 MED ORDER — MELOXICAM 15 MG PO TABS
15.0000 mg | ORAL_TABLET | Freq: Every day | ORAL | 3 refills | Status: DC | PRN
Start: 2022-12-30 — End: 2023-11-26

## 2022-12-30 NOTE — Telephone Encounter (Signed)
Please forward to ordering provider.   Joshua Guerrero

## 2022-12-30 NOTE — Telephone Encounter (Signed)
Requested medication (s) are due for refill today: yes  Requested medication (s) are on the active medication list: yes  Last refill:  multiple dates  Future visit scheduled: yes  Notes to clinic:  Unable to refill per protocol, cannot delegate.      Requested Prescriptions  Pending Prescriptions Disp Refills   meloxicam (MOBIC) 15 MG tablet 30 tablet 3    Sig: Take 1 tablet (15 mg total) by mouth daily as needed for pain.     Analgesics:  COX2 Inhibitors Failed - 12/29/2022  9:55 AM      Failed - Manual Review: Labs are only required if the patient has taken medication for more than 8 weeks.      Passed - HGB in normal range and within 360 days    Hemoglobin  Date Value Ref Range Status  09/18/2022 14.0 13.0 - 17.7 g/dL Final         Passed - Cr in normal range and within 360 days    Creatinine  Date Value Ref Range Status  12/12/2011 1.09 0.60 - 1.30 mg/dL Final   Creatinine, Ser  Date Value Ref Range Status  09/18/2022 0.81 0.76 - 1.27 mg/dL Final         Passed - HCT in normal range and within 360 days    Hematocrit  Date Value Ref Range Status  09/18/2022 41.9 37.5 - 51.0 % Final         Passed - AST in normal range and within 360 days    AST  Date Value Ref Range Status  06/18/2022 24 0 - 40 IU/L Final   SGOT(AST)  Date Value Ref Range Status  12/12/2011 83 (H) 15 - 37 Unit/L Final         Passed - ALT in normal range and within 360 days    ALT  Date Value Ref Range Status  06/18/2022 21 0 - 44 IU/L Final   SGPT (ALT)  Date Value Ref Range Status  12/12/2011 73 12 - 78 U/L Final         Passed - eGFR is 30 or above and within 360 days    EGFR (African American)  Date Value Ref Range Status  12/12/2011 >60  Final   GFR calc Af Amer  Date Value Ref Range Status  05/16/2018 112 >59 mL/min/1.73 Final   EGFR (Non-African Amer.)  Date Value Ref Range Status  12/12/2011 >60  Final    Comment:    eGFR values <71mL/min/1.73 m2 may be an  indication of chronic kidney disease (CKD). Calculated eGFR is useful in patients with stable renal function. The eGFR calculation will not be reliable in acutely ill patients when serum creatinine is changing rapidly. It is not useful in  patients on dialysis. The eGFR calculation may not be applicable to patients at the low and high extremes of body sizes, pregnant women, and vegetarians.    GFR, Estimated  Date Value Ref Range Status  07/25/2020 >60 >60 mL/min Final    Comment:    (NOTE) Calculated using the CKD-EPI Creatinine Equation (2021)    eGFR  Date Value Ref Range Status  09/18/2022 98 >59 mL/min/1.73 Final         Passed - Patient is not pregnant      Passed - Valid encounter within last 12 months    Recent Outpatient Visits           1 month ago Primary hypertension   [place]  [facility] [place], [place], PA-C   1 month ago Chest congestion   [place] [facility] [place], [place], MD   3 months ago Balance problem   [place] [facility] [place], [place], MD   4 months ago HTN, goal below 130/80   [place] [facility] Simmons-Robinson, [place], MD   4 months ago Chronic neck and back pain   [place] [facility] P[place], [place], MD       Future Appointments             In 2 weeks Simmons-Robinson, Tawanna Cooler, MD [facility]             promethazine (PHENERGAN) 25 MG tablet 45 tablet 1    Sig: Take 1 tablet (25 mg total) by mouth every 8 (eight) hours as needed.     Not Delegated - Gastroenterology: Antiemetics Failed - 12/29/2022  9:55 AM      Failed - This refill cannot be delegated      Passed - Valid encounter within last 6 months    Recent Outpatient Visits           1 month ago Primary hypertension   [place] [facility] [place], [place], PA-C   1 month ago Chest congestion    [place] [facility] [place], [place], MD   3 months ago Balance problem   [place] [facility] [place], [place], MD   4 months ago HTN, goal below 130/80   [place] [facility] Simmons-Robinson, [place], MD   4 months ago Chronic neck and back pain   [place] [facility] [place], [place], MD       Future Appointments             In 2 weeks Simmons-Robinson, Tawanna Cooler, MD [facility], [facility]             prochlorperazine (COMPAZINE) 10 MG tablet 90 tablet 1    Sig: Take 1 tablet (10 mg total) by mouth every 8 (eight) hours as needed.     Not Delegated - Gastroenterology: Antiemetics Failed - 12/29/2022  9:55 AM      Failed - This refill cannot be delegated      Passed - Valid encounter within last 6 months    Recent Outpatient Visits           1 month ago Primary hypertension   [place] [facility] [place], [place], PA-C   1 month ago Chest congestion   [place] [facility] Mus[place], [place], MD   3 months ago Balance problem   [place] [facility] [place], [place], MD   4 months ago HTN, goal below 130/80   [facility] Simmons-Robinson, [employer], MD   4 months ago Chronic neck and back pain   [place] [facility] Simmons-Robinson, Tawanna Cooler, MD       Future Appointments             In 2 weeks Simmons-Robinson, Tawanna Cooler, MD [facility], [place]

## 2022-12-31 ENCOUNTER — Encounter: Payer: Self-pay | Admitting: Physical Medicine and Rehabilitation

## 2023-01-01 ENCOUNTER — Ambulatory Visit
Admission: RE | Admit: 2023-01-01 | Discharge: 2023-01-01 | Disposition: A | Discharge status: Home / Self Care | Payer: Medicare HMO | Source: Ambulatory Visit | Attending: Physical Medicine and Rehabilitation | Admitting: Physical Medicine and Rehabilitation

## 2023-01-01 DIAGNOSIS — M5416 Radiculopathy, lumbar region: Secondary | ICD-10-CM

## 2023-01-01 DIAGNOSIS — S3992XA Unspecified injury of lower back, initial encounter: Secondary | ICD-10-CM | POA: Diagnosis not present

## 2023-01-06 IMAGING — CT NM PET TUM IMG INITIAL (PI) SKULL BASE T - THIGH
1 of 9 series · 1 of 25 positions shown · non-contrast
Comparison: Chest CT 06/14/2020.  Abdominal MRI 06/27/2020.

CLINICAL DATA: Initial treatment strategy for right-sided lung
mass/consolidation. Indeterminate slowly enlarging splenic lesion.

EXAM:
NUCLEAR MEDICINE PET SKULL BASE TO THIGH
TECHNIQUE: 9.2 mCi F-18 FDG was injected intravenously. Full-ring PET imaging
was performed from the skull base to thigh after the radiotracer. CT
data was obtained and used for attenuation correction and anatomic
localization.
Fasting blood glucose: 107 mg/dl

[Series 3: ct wb 5.0 b30f · axial · 5.0mm · 0.98mm/px · 1 of 329 slices shown]
[im 329/329  brain]
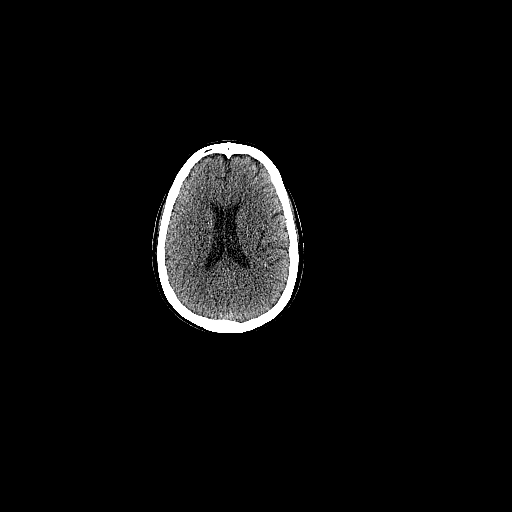

[1 of 25 positions shown; findings below may reference images not displayed]

FINDINGS: Mediastinal blood pool activity: SUV max

Liver activity: SUV max NA

NECK: No areas of abnormal hypermetabolism.

Incidental CT findings: No cervical adenopathy. Bilateral carotid
atherosclerosis.

CHEST: The right upper lobe process is progressive and
hypermetabolic. For example, soft tissue density cephalad to the
06/14/2020 process measures 3.7 x 2.5 cm on 88/3.

At the level measured on the prior CT, the consolidation with
minimal air bronchograms measures 5.8 x 3.1 cm and a S.U.V. max of
9.9 on 98/3. Compare 5.2 x 3.0 cm at the same level on the prior
exam.

Low-level hypermetabolism corresponding to previously described
right hilar and suprahilar adenopathy. Example at a S.U.V. max of
3.8 on 96/3.

Adjacent right paratracheal node measures 8 mm and a S.U.V. max of
2.8 on 93/3.

Incidental CT findings: Aortic and coronary artery atherosclerosis.
Mild centrilobular emphysema.

ABDOMEN/PELVIS: Low-level, mildly heterogeneous activity involves
the exophytic splenic mass. Example 5.7 x 6.0 cm and a S.U.V. max of
3.0 on 151/3.

No abdominopelvic nodal hypermetabolism.

Incidental CT findings: Upper pole right renal 1.5 cm cyst.
Abdominal aortic atherosclerosis. Retroaortic left renal vein.
Normal adrenal glands.

SKELETON: No abnormal marrow activity.

Incidental CT findings: Cervical spine fixation.
IMPRESSION: 1. Progression of right upper lobe masslike consolidation with
concurrent hypermetabolism. Given the extent of progression compared
to the prior CT of less than 1 month ago, infection is favored. An
aggressive malignancy could look similar.
2. Hypermetabolic right hilar and right mediastinal nodes, likely of
the same etiology as the right upper lobe lung process. Potential
clinical strategies include sampling versus CT follow-up at 4-6
weeks after antibiotic therapy.
3. Low-level heterogeneous activity involving the splenic mass,
again favoring a benign etiology such as a hemangioma or
lymphangioma.
4. Aortic atherosclerosis (H7Z1W-E2A.A), coronary artery
atherosclerosis and emphysema ([id number].C).

## 2023-01-14 DIAGNOSIS — M47812 Spondylosis without myelopathy or radiculopathy, cervical region: Secondary | ICD-10-CM | POA: Diagnosis not present

## 2023-01-14 DIAGNOSIS — G894 Chronic pain syndrome: Secondary | ICD-10-CM | POA: Diagnosis not present

## 2023-01-14 DIAGNOSIS — M961 Postlaminectomy syndrome, not elsewhere classified: Secondary | ICD-10-CM | POA: Diagnosis not present

## 2023-01-14 DIAGNOSIS — M47817 Spondylosis without myelopathy or radiculopathy, lumbosacral region: Secondary | ICD-10-CM | POA: Diagnosis not present

## 2023-01-19 ENCOUNTER — Ambulatory Visit: Payer: Medicare HMO | Admitting: Family Medicine

## 2023-01-19 ENCOUNTER — Ambulatory Visit: Payer: Medicare HMO | Admitting: Physician Assistant

## 2023-01-20 ENCOUNTER — Ambulatory Visit (INDEPENDENT_AMBULATORY_CARE_PROVIDER_SITE_OTHER): Payer: Medicare HMO | Admitting: Physician Assistant

## 2023-01-20 ENCOUNTER — Encounter: Payer: Self-pay | Admitting: Physician Assistant

## 2023-01-20 VITALS — BP 110/63 | HR 69 | Ht 69.0 in | Wt 151.7 lb

## 2023-01-20 DIAGNOSIS — I10 Essential (primary) hypertension: Secondary | ICD-10-CM | POA: Diagnosis not present

## 2023-01-20 DIAGNOSIS — R110 Nausea: Secondary | ICD-10-CM

## 2023-01-20 DIAGNOSIS — F5101 Primary insomnia: Secondary | ICD-10-CM

## 2023-01-20 DIAGNOSIS — R208 Other disturbances of skin sensation: Secondary | ICD-10-CM

## 2023-01-20 DIAGNOSIS — G8929 Other chronic pain: Secondary | ICD-10-CM

## 2023-01-20 DIAGNOSIS — Z23 Encounter for immunization: Secondary | ICD-10-CM | POA: Diagnosis not present

## 2023-01-20 DIAGNOSIS — M549 Dorsalgia, unspecified: Secondary | ICD-10-CM

## 2023-01-20 MED ORDER — TRAZODONE HCL 150 MG PO TABS: 150.0000 mg | ORAL_TABLET | Freq: Every day | ORAL | 0 refills | Status: DC

## 2023-01-20 NOTE — Patient Instructions (Signed)
Please, take a glass of kefir before the designated time for BM

## 2023-01-21 ENCOUNTER — Ambulatory Visit: Payer: Medicare HMO

## 2023-01-21 DIAGNOSIS — R208 Other disturbances of skin sensation: Secondary | ICD-10-CM | POA: Insufficient documentation

## 2023-01-21 DIAGNOSIS — R110 Nausea: Secondary | ICD-10-CM | POA: Insufficient documentation

## 2023-01-21 DIAGNOSIS — I10 Essential (primary) hypertension: Secondary | ICD-10-CM | POA: Insufficient documentation

## 2023-01-21 LAB — CBC WITH DIFFERENTIAL/PLATELET
Basophils Absolute: 0.1 10*3/uL (ref 0.0–0.2)
Basos: 1 %
EOS (ABSOLUTE): 0.2 10*3/uL (ref 0.0–0.4)
Eos: 2 %
Hematocrit: 44.6 % (ref 37.5–51.0)
Hemoglobin: 14.8 g/dL (ref 13.0–17.7)
Immature Grans (Abs): 0 10*3/uL (ref 0.0–0.1)
Immature Granulocytes: 0 %
Lymphocytes Absolute: 1.5 10*3/uL (ref 0.7–3.1)
Lymphs: 15 %
MCH: 31.1 pg (ref 26.6–33.0)
MCHC: 33.2 g/dL (ref 31.5–35.7)
MCV: 94 fL (ref 79–97)
Monocytes Absolute: 0.7 10*3/uL (ref 0.1–0.9)
Monocytes: 7 %
Neutrophils Absolute: 7.3 10*3/uL — ABNORMAL HIGH (ref 1.4–7.0)
Neutrophils: 75 %
Platelets: 263 10*3/uL (ref 150–450)
RBC: 4.76 x10E6/uL (ref 4.14–5.80)
RDW: 13.4 % (ref 11.6–15.4)
WBC: 9.7 10*3/uL (ref 3.4–10.8)

## 2023-01-21 LAB — BASIC METABOLIC PANEL
BUN/Creatinine Ratio: 13 (ref 10–24)
BUN: 9 mg/dL (ref 8–27)
CO2: 27 mmol/L (ref 20–29)
Calcium: 8.3 mg/dL — ABNORMAL LOW (ref 8.6–10.2)
Chloride: 103 mmol/L (ref 96–106)
Creatinine, Ser: 0.67 mg/dL — ABNORMAL LOW (ref 0.76–1.27)
Glucose: 80 mg/dL (ref 70–99)
Potassium: 4.1 mmol/L (ref 3.5–5.2)
Sodium: 143 mmol/L (ref 134–144)
eGFR: 104 mL/min/{1.73_m2} (ref >59)

## 2023-01-21 NOTE — Progress Notes (Signed)
Established patient visit  Patient: Joshua Guerrero   DOB: 15-Nov-1955   67 y.o. Male  MRN: [id number] Visit Date: 01/20/2023  Today's healthcare provider: Debera Lat, PA-C   Chief Complaint  Patient presents with   Medical Management of Chronic Issues    Patient would like to discuss medication refill, sleep medication being ineffective, burning of the "crotch" area Has had burning sensation in physical activity only not when urinating, has been a problem for couple months, no treatment, has had hernia surgery     Subjective     Discussed the use of AI scribe software for clinical note transcription with the patient, who gave verbal consent to proceed.  History of Present Illness   The patient, with a history of hernia surgery, presents with a burning sensation in the groin area. The discomfort is not associated with urination but is described as a feeling of something tearing. The patient suspects a possible recurrence of hernia, although there is no mention of a visible bulge or mass in the area.  In addition to the groin discomfort, the patient is also seeking medication refills. He is currently on multiple medications including Metoprolol and Benicar for hypertension, Meloxicam for inflammation, compazine for nausea caused by Fentanyl, and Venlafaxine (Pristiq) taken daily. The patient reports needing to take nine Trazodone pills and over-the-counter sleep aids to achieve sleep and requests an increase in the Trazodone dosage. The patient has been taking these medications since 2003 and reports no issues with constipation, a common side effect of opioids.           09/18/2022    1:51 PM 05/29/2022    1:34 PM 05/28/2022    1:14 PM  Depression screen PHQ 2/9  Decreased Interest 0 0 0  Down, Depressed, Hopeless 0 0 1  PHQ - 2 Score 0 0 1  Altered sleeping 0 0   Tired, decreased energy 0 0   Change in appetite 0 0   Feeling bad or failure about yourself  0 0   Trouble  concentrating 0 0   Moving slowly or fidgety/restless 0 0   Suicidal thoughts 0 0   PHQ-9 Score 0 0   Difficult doing work/chores Not difficult at all Not difficult at all        No data to display          Medications: Outpatient Medications Prior to Visit  Medication Sig   albuterol (VENTOLIN HFA) 108 (90 Base) MCG/ACT inhaler Inhale 2 puffs into the lungs every 4 (four) hours as needed for wheezing or shortness of breath.   baclofen (LIORESAL) 10 MG tablet Take 10 mg by mouth 4 (four) times daily. STARTING AT LUNCHTIME   desvenlafaxine (PRISTIQ) 100 MG 24 hr tablet Take 1 tablet (100 mg total) by mouth daily.   fentaNYL (DURAGESIC - DOSED MCG/HR) 25 MCG/HR patch Place 1 patch onto the skin every 3 (three) days.   meloxicam (MOBIC) 15 MG tablet Take 1 tablet (15 mg total) by mouth daily as needed for pain.   metoprolol succinate (TOPROL-XL) 50 MG 24 hr tablet Take with or immediately following a meal.   NARCAN 4 MG/0.1ML LIQD nasal spray kit Place 1 spray into the nose once.   olmesartan (BENICAR) 5 MG tablet TAKE 2 TABLETS BY MOUTH DAILY   omeprazole (PRILOSEC) 40 MG capsule TAKE 1 CAPSULE DAILY   oxyCODONE-acetaminophen (PERCOCET) 10-325 MG tablet Take 1 tablet by mouth every 4 (four) hours. 3AM, 7AM, 11AM, 3 PM,  7PM AND 11PM   prochlorperazine (COMPAZINE) 10 MG tablet Take 1 tablet (10 mg total) by mouth every 8 (eight) hours as needed.   promethazine (PHENERGAN) 25 MG tablet Take 1 tablet (25 mg total) by mouth every 8 (eight) hours as needed.   sucralfate (CARAFATE) 1 g tablet TAKE 1 TABLET BY MOUTH 4 TIMES A DAY WITH MEALS AND AT BEDTIME   trazodone (DESYREL) 300 MG tablet Take 1 tablet (300 mg total) by mouth at bedtime.   TRELEGY ELLIPTA 100-62.5-25 MCG/ACT AEPB INHALE 1 PUFF BY MOUTH INTO THE LUNGS DAILY   No facility-administered medications prior to visit.    Review of Systems  All other systems reviewed and are negative.  Except see HPI       Objective    BP  110/63 (BP Location: Right Arm, Patient Position: Sitting, Cuff Size: Normal)   Pulse 69   Ht 5\' 9"  (1.753 m)   Wt 151 lb 11.2 oz (68.8 kg)   SpO2 97%   BMI 22.40 kg/m     Physical Exam Vitals reviewed.  Constitutional:      General: He is not in acute distress.    Appearance: Normal appearance. He is not diaphoretic.  HENT:     Head: Normocephalic and atraumatic.  Eyes:     General: No scleral icterus.    Conjunctiva/sclera: Conjunctivae normal.  Cardiovascular:     Rate and Rhythm: Normal rate and regular rhythm.     Pulses: Normal pulses.     Heart sounds: Normal heart sounds. No murmur heard. Pulmonary:     Effort: Pulmonary effort is normal. No respiratory distress.     Breath sounds: Normal breath sounds. No wheezing or rhonchi.  Musculoskeletal:     Cervical back: Neck supple.     Right lower leg: No edema.     Left lower leg: No edema.  Lymphadenopathy:     Cervical: No cervical adenopathy.  Skin:    General: Skin is warm and dry.     Findings: No rash.  Neurological:     Mental Status: He is alert and oriented to person, place, and time. Mental status is at baseline.  Psychiatric:        Mood and Affect: Mood normal.        Behavior: Behavior normal.      No results found for any visits on 01/20/23.  Assessment & Plan    Primary insomnia Chronic Could be due to anxiety Patient reports difficulty sleeping, currently taking Trazodone , but still requiring multiple over-the-counter sleep aids. -Consider re-evaluation of sleep aid regimen due to potential risk of side effects when combined with Fentanyl. - traZODone (DESYREL) 150 MG tablet; Take 1 tablet (150 mg total) by mouth at bedtime.  Dispense: 90 tablet; Refill: 0 Continue taking pristiq 100mg  daily. Monitoring BP and electrolytes esp sodium ? and creatinine/kidney function. - CBC w/Diff/Platelet - Basic Metabolic Panel (BMET)  Burning sensation Groin Pain/right Patient reports intermittent  burning sensation in the right groin area, not associated with urination. History of hernia surgery. No palpable mass or bulge. Suspected inguinal hernia? Same symptoms with inguinal hernia/left in the past -Collect urine sample for analysis and culture to rule out infection.could not void -Consider further imaging if pain persists or worsens. - POCT urinalysis dipstick - Urine Culture Will reassess after lab results  Flu vaccine need - Flu Vaccine Trivalent High Dose (Fluad)    Hypertension Chronic and stable Patient reports blood pressure readings within normal  range. Currently on Metoprolol 50mg  and Olmesartan 5mg . -Continue current medications. -Encourage regular blood pressure monitoring at home. Continue low salt diet Will FU  Chronic Pain Patient reports widespread inflammation and pain, particularly in lower back and neck. Currently on Meloxicam and Fentanyl. -Continue Meloxicam 15mg  with meals. -Consider re-evaluation of pain management strategy due to potential risks associated with long-term opioid use.  Nausea Chronic Patient reports nausea associated with Fentanyl use. Currently on Prochlorperazine for symptom management. -Continue Prochlorperazine 10mg  as needed for nausea.  Follow-up in three months.     No follow-ups on file.     The patient was advised to call back or seek an in-person evaluation if the symptoms worsen or if the condition fails to improve as anticipated.  I discussed the assessment and treatment plan with the patient. The patient was provided an opportunity to ask questions and all were answered. The patient agreed with the plan and demonstrated an understanding of the instructions.  I, Debera Lat, PA-C have reviewed all documentation for this visit. The documentation on  01/20/23  for the exam, diagnosis, procedures, and orders are all accurate and complete.  Debera Lat, [facility], MMS [facility] [phone]  (phone) [phone] (fax)  [facility]

## 2023-01-25 ENCOUNTER — Ambulatory Visit: Payer: Self-pay

## 2023-01-25 NOTE — Telephone Encounter (Signed)
Chief Complaint: Right groin pain Symptoms: Burning pain 8/10 becoming constant, swelling this morning but not at this time Frequency: onset a couple months off and on pain Pertinent Negatives: Patient denies other symptoms Disposition: [] ED /[] Urgent Care (no appt availability in office) / [x] Appointment(In office/virtual)/ []  [facility]/ [] Home Care/ [] Refused Recommended Disposition /[] [facility]/ []  Follow-up with PCP Additional Notes: Advised OV, he says he would like another provider other than Janna because he's HOH and can't understand her and can't read her lips. Advised Wednesday with Robynn Pane is the earliest, he agrees with that visit.   Reason for Disposition  [1] Brief pain in scrotum or testicle AND [2] present < 1 hour AND [3] recurrent  (NO swelling)  Answer Assessment - Initial Assessment Questions 1. LOCATION and RADIATION: "Where is the pain located?"      Right groin 2. QUALITY: "What does the pain feel like?"  (e.g., sharp, dull, aching, burning)     Burning and stabbing 3. SEVERITY: "How bad is the pain?"  (Scale 1-10; or mild, moderate, severe)   - MILD (1-3): doesn't interfere with normal activities    - MODERATE (4-7): interferes with normal activities (e.g., work or school) or awakens from sleep   - SEVERE (8-10): excruciating pain, unable to do any normal activities, difficulty walking     8  4. ONSET: "When did the pain start?"     A couple months 5. PATTERN: "Does it come and go, or has it been constant since it started?"     Constant 6. SCROTAL APPEARANCE: "What does the scrotum look like?" "Is there any swelling or redness?"      Swelling at times, not at present 7. OTHER SYMPTOMS: "Do you have any other symptoms?" (e.g., abdomen pain, difficulty passing urine, fever, vomiting)     No  Protocols used: Scrotum Pain-A-AH

## 2023-01-26 ENCOUNTER — Telehealth: Payer: Self-pay

## 2023-01-26 NOTE — Telephone Encounter (Signed)
Copied from CRM [id number]. Topic: General - Other >> Jan 25, 2023  2:36 PM Dondra Prader E wrote: Reason for CRM: Pt called reporting that his current prescription for trazodone is not strong enough

## 2023-01-27 ENCOUNTER — Ambulatory Visit (INDEPENDENT_AMBULATORY_CARE_PROVIDER_SITE_OTHER): Payer: Medicare HMO | Admitting: Family Medicine

## 2023-01-27 ENCOUNTER — Encounter: Payer: Self-pay | Admitting: Family Medicine

## 2023-01-27 VITALS — BP 112/64 | HR 74 | Ht 69.0 in | Wt 150.1 lb

## 2023-01-27 DIAGNOSIS — R1031 Right lower quadrant pain: Secondary | ICD-10-CM | POA: Diagnosis not present

## 2023-01-27 DIAGNOSIS — K4021 Bilateral inguinal hernia, without obstruction or gangrene, recurrent: Secondary | ICD-10-CM | POA: Diagnosis not present

## 2023-01-27 DIAGNOSIS — G8929 Other chronic pain: Secondary | ICD-10-CM | POA: Insufficient documentation

## 2023-01-27 NOTE — Progress Notes (Signed)
Established patient visit   Patient: Joshua Guerrero   DOB: 28-Oct-1955   67 y.o. Male  MRN: [id number] Visit Date: 01/27/2023  Today's healthcare provider: Jacky Kindle, FNP  Introduced to nurse practitioner role and practice setting.  All questions answered.  Discussed provider/patient relationship and expectations.  Subjective    HPI HPI     Medical Management of Chronic Issues    Additional comments: Right groin pain, possibly from hernia pt stated, no other concerns, has been a problem for 2 months but has become worse within the last 2 weeks, in the way of physical activity      Last edited by Rolly Salter, CMA on 01/27/2023  2:39 PM.      Medications: Outpatient Medications Prior to Visit  Medication Sig   albuterol (VENTOLIN HFA) 108 (90 Base) MCG/ACT inhaler Inhale 2 puffs into the lungs every 4 (four) hours as needed for wheezing or shortness of breath.   baclofen (LIORESAL) 10 MG tablet Take 10 mg by mouth 4 (four) times daily. STARTING AT LUNCHTIME   desvenlafaxine (PRISTIQ) 100 MG 24 hr tablet Take 1 tablet (100 mg total) by mouth daily.   fentaNYL (DURAGESIC - DOSED MCG/HR) 25 MCG/HR patch Place 1 patch onto the skin every 3 (three) days.   meloxicam (MOBIC) 15 MG tablet Take 1 tablet (15 mg total) by mouth daily as needed for pain.   metoprolol succinate (TOPROL-XL) 50 MG 24 hr tablet Take with or immediately following a meal.   NARCAN 4 MG/0.1ML LIQD nasal spray kit Place 1 spray into the nose once.   olmesartan (BENICAR) 5 MG tablet TAKE 2 TABLETS BY MOUTH DAILY   omeprazole (PRILOSEC) 40 MG capsule TAKE 1 CAPSULE DAILY   oxyCODONE-acetaminophen (PERCOCET) 10-325 MG tablet Take 1 tablet by mouth every 4 (four) hours. 3AM, 7AM, 11AM, 3 PM, 7PM AND 11PM   prochlorperazine (COMPAZINE) 10 MG tablet Take 1 tablet (10 mg total) by mouth every 8 (eight) hours as needed.   promethazine (PHENERGAN) 25 MG tablet Take 1 tablet (25 mg total) by mouth every 8 (eight)  hours as needed.   sucralfate (CARAFATE) 1 g tablet TAKE 1 TABLET BY MOUTH 4 TIMES A DAY WITH MEALS AND AT BEDTIME   traZODone (DESYREL) 150 MG tablet Take 1 tablet (150 mg total) by mouth at bedtime.   trazodone (DESYREL) 300 MG tablet Take 1 tablet (300 mg total) by mouth at bedtime.   TRELEGY ELLIPTA 100-62.5-25 MCG/ACT AEPB INHALE 1 PUFF BY MOUTH INTO THE LUNGS DAILY   No facility-administered medications prior to visit.     Objective    BP 112/64 (BP Location: Left Arm, Patient Position: Sitting, Cuff Size: Normal)   Pulse 74   Ht 5\' 9"  (1.753 m)   Wt 150 lb 1.6 oz (68.1 kg)   BMI 22.17 kg/m   Physical Exam Vitals and nursing note reviewed.  Constitutional:      Appearance: Normal appearance. He is normal weight. He is ill-appearing.  HENT:     Head: Normocephalic and atraumatic.  Cardiovascular:     Rate and Rhythm: Normal rate and regular rhythm.     Pulses: Normal pulses.     Heart sounds: Normal heart sounds.  Pulmonary:     Effort: Pulmonary effort is normal.     Breath sounds: Normal breath sounds.  Abdominal:     General: Bowel sounds are normal.     Palpations: Abdomen is soft.  Hernia: A hernia is present.  Genitourinary:   Musculoskeletal:        General: Normal range of motion.     Cervical back: Normal range of motion.  Skin:    General: Skin is warm and dry.     Capillary Refill: Capillary refill takes less than 2 seconds.  Neurological:     General: No focal deficit present.     Mental Status: He is alert and oriented to person, place, and time. Mental status is at baseline.    No results found for any visits on 01/27/23.  Assessment & Plan     Problem List Items Addressed This Visit       Other   Bilateral recurrent inguinal hernia without obstruction or gangrene - Primary    Acute on chronic, palpable mass into R groin; able to reduce However, burning pain present Pt denies changes in bowel, bladder, or concern for STI. Denies pain  into testicular region or acute swelling of testicles Recommend imaging and consult with surgery Seek emergent care if worsening pain  Defer treatment changes to insomnia plan until after pain control is established       Relevant Orders   Ambulatory referral to General Surgery   Chronic groin pain, right   Relevant Orders   CT ABDOMEN PELVIS WO CONTRAST   Ambulatory referral to General Surgery   Return if symptoms worsen or fail to improve seek emergent care.     Leilani Merl, FNP, have reviewed all documentation for this visit. The documentation on 01/27/23 for the exam, diagnosis, procedures, and orders are all accurate and complete.  Jacky Kindle, FNP  [facility] [phone] (phone) [phone] (fax)  [facility]

## 2023-01-27 NOTE — Assessment & Plan Note (Signed)
Acute on chronic, palpable mass into R groin; able to reduce However, burning pain present Pt denies changes in bowel, bladder, or concern for STI. Denies pain into testicular region or acute swelling of testicles Recommend imaging and consult with surgery Seek emergent care if worsening pain  Defer treatment changes to insomnia plan until after pain control is established

## 2023-02-02 ENCOUNTER — Other Ambulatory Visit: Payer: Self-pay | Admitting: Family Medicine

## 2023-02-02 DIAGNOSIS — R12 Heartburn: Secondary | ICD-10-CM

## 2023-02-02 MED ORDER — SUCRALFATE 1 G PO TABS
ORAL_TABLET | ORAL | 11 refills | Status: DC
Start: 2023-02-02 — End: 2024-02-17

## 2023-02-03 ENCOUNTER — Ambulatory Visit
Admission: RE | Admit: 2023-02-03 | Discharge: 2023-02-03 | Disposition: A | Discharge status: Home / Self Care | Payer: Medicare HMO | Source: Ambulatory Visit | Attending: Family Medicine | Admitting: Family Medicine

## 2023-02-03 DIAGNOSIS — K4021 Bilateral inguinal hernia, without obstruction or gangrene, recurrent: Secondary | ICD-10-CM | POA: Insufficient documentation

## 2023-02-03 DIAGNOSIS — G8929 Other chronic pain: Secondary | ICD-10-CM | POA: Insufficient documentation

## 2023-02-03 DIAGNOSIS — R1031 Right lower quadrant pain: Secondary | ICD-10-CM | POA: Insufficient documentation

## 2023-02-03 DIAGNOSIS — R161 Splenomegaly, not elsewhere classified: Secondary | ICD-10-CM | POA: Diagnosis not present

## 2023-02-04 ENCOUNTER — Ambulatory Visit: Payer: Medicare HMO | Admitting: Surgery
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
## 2023-02-11 DIAGNOSIS — G894 Chronic pain syndrome: Secondary | ICD-10-CM | POA: Diagnosis not present

## 2023-02-11 DIAGNOSIS — M961 Postlaminectomy syndrome, not elsewhere classified: Secondary | ICD-10-CM | POA: Diagnosis not present

## 2023-02-11 DIAGNOSIS — M47812 Spondylosis without myelopathy or radiculopathy, cervical region: Secondary | ICD-10-CM | POA: Diagnosis not present

## 2023-02-11 DIAGNOSIS — M47817 Spondylosis without myelopathy or radiculopathy, lumbosacral region: Secondary | ICD-10-CM | POA: Diagnosis not present

## 2023-02-16 ENCOUNTER — Ambulatory Visit: Payer: Medicare HMO | Admitting: Surgery

## 2023-02-16 ENCOUNTER — Encounter: Payer: Self-pay | Admitting: Surgery

## 2023-02-16 ENCOUNTER — Telehealth: Payer: Self-pay | Admitting: Surgery

## 2023-02-16 ENCOUNTER — Ambulatory Visit: Payer: Self-pay | Admitting: Surgery

## 2023-02-16 VITALS — BP 193/87 | HR 61 | Temp 98.3°F | Ht 69.0 in | Wt 150.6 lb

## 2023-02-16 DIAGNOSIS — K4090 Unilateral inguinal hernia, without obstruction or gangrene, not specified as recurrent: Secondary | ICD-10-CM | POA: Diagnosis not present

## 2023-02-16 NOTE — Progress Notes (Signed)
Patient ID: Joshua Guerrero, male   DOB: [birth year], 67 y.o.   MRN: [id number]  Chief Complaint: Right groin pain  History of Present Illness Joshua Guerrero is a 67 y.o. male with history of right groin pain with associated bulge getting worse over the last 2 months.  Here with reports he cannot push it in.  Denies nausea, vomiting, fevers or chills.  Reports a persistent soreness/swelling with a burning sensation.  Reports he can merely go walking his day dog to provoke and exacerbate the pain.  He has a history of chronic neck and back pain following an accident, and is currently receiving the fentanyl patch prescribed from a  pain specialist.  Denies any constipation or difficulty voiding.  Previous abdominal surgery or inguinal hernia surgery.  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   Sleep apnea    Not currently using a C-Pap      Past Surgical History:  Procedure Laterality Date   COLONOSCOPY WITH PROPOFOL N/A 02/11/2015   Procedure: COLONOSCOPY WITH PROPOFOL;  Surgeon: Midge Minium, MD;  Location: [facility];  Service: Endoscopy;  Laterality: N/A;  USES C-PAP   ESOPHAGEAL DILATION N/A 10/14/2018   Procedure: ESOPHAGEAL DILATION;  Surgeon: Midge Minium, MD;  Location: [facility];  Service: Endoscopy;  Laterality: N/A;   ESOPHAGOGASTRODUODENOSCOPY (EGD) WITH PROPOFOL N/A 10/14/2018   Procedure: ESOPHAGOGASTRODUODENOSCOPY (EGD) WITH BIOPSY;  Surgeon: Midge Minium, MD;  Location: [facility];  Service: Endoscopy;  Laterality: N/A;  sleep  apnea   NASAL SINUS SURGERY     due to severe sleep apnea   NECK SURGERY     diffuse disc-and put in possibly screws patient thinks   SHOULDER SURGERY Bilateral    x 2   UMBILICAL HERNIA REPAIR N/A 02/26/2017   Primary repair.  Surgeon: Earline Mayotte, MD;  Location: [facility];  Service: General;  Laterality: N/A;   VIDEO BRONCHOSCOPY WITH ENDOBRONCHIAL NAVIGATION N/A 07/29/2020   Procedure: VIDEO BRONCHOSCOPY WITH ENDOBRONCHIAL NAVIGATION;  Surgeon: Salena Saner, MD;  Location: [facility];  Service: Pulmonary;  Laterality: N/A;   VIDEO BRONCHOSCOPY WITH ENDOBRONCHIAL ULTRASOUND N/A 07/29/2020   Procedure: VIDEO BRONCHOSCOPY WITH ENDOBRONCHIAL ULTRASOUND;  Surgeon: Salena Saner, MD;  Location: [facility];  Service: Pulmonary;  Laterality: N/A;    Allergies  Allergen Reactions   Acetaminophen Shortness Of Breath   Codeine Itching and Nausea And Vomiting   Opana [Oxymorphone] Swelling    Current Outpatient Medications  Medication Sig Dispense Refill   albuterol (VENTOLIN HFA) 108 (90 Base) MCG/ACT inhaler Inhale 2 puffs into the lungs every 4 (four) hours as needed for wheezing or shortness of breath.     baclofen (LIORESAL) 10 MG tablet Take 10 mg by mouth 4 (four) times daily. STARTING AT LUNCHTIME     desvenlafaxine (PRISTIQ) 100 MG 24 hr tablet Take 1 tablet (100 mg total) by mouth daily. 90 tablet 1   fentaNYL (DURAGESIC - DOSED MCG/HR) 25 MCG/HR  Patient ID: Joshua Guerrero, male   DOB: [birth year], 67 y.o.   MRN: [id number]  Chief Complaint: Right groin pain  History of Present Illness Joshua Guerrero is a 67 y.o. male with history of right groin pain with associated bulge getting worse over the last 2 months.  Here with reports he cannot push it in.  Denies nausea, vomiting, fevers or chills.  Reports a persistent soreness/swelling with a burning sensation.  Reports he can merely go walking his day dog to provoke and exacerbate the pain.  He has a history of chronic neck and back pain following an accident, and is currently receiving the fentanyl patch prescribed from a  pain specialist.  Denies any constipation or difficulty voiding.  Previous abdominal surgery or inguinal hernia surgery.  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   Sleep apnea    Not currently using a C-Pap      Past Surgical History:  Procedure Laterality Date   COLONOSCOPY WITH PROPOFOL N/A 02/11/2015   Procedure: COLONOSCOPY WITH PROPOFOL;  Surgeon: Midge Minium, MD;  Location: [facility];  Service: Endoscopy;  Laterality: N/A;  USES C-PAP   ESOPHAGEAL DILATION N/A 10/14/2018   Procedure: ESOPHAGEAL DILATION;  Surgeon: Midge Minium, MD;  Location: [facility];  Service: Endoscopy;  Laterality: N/A;   ESOPHAGOGASTRODUODENOSCOPY (EGD) WITH PROPOFOL N/A 10/14/2018   Procedure: ESOPHAGOGASTRODUODENOSCOPY (EGD) WITH BIOPSY;  Surgeon: Midge Minium, MD;  Location: [facility];  Service: Endoscopy;  Laterality: N/A;  sleep  apnea   NASAL SINUS SURGERY     due to severe sleep apnea   NECK SURGERY     diffuse disc-and put in possibly screws patient thinks   SHOULDER SURGERY Bilateral    x 2   UMBILICAL HERNIA REPAIR N/A 02/26/2017   Primary repair.  Surgeon: Earline Mayotte, MD;  Location: [facility];  Service: General;  Laterality: N/A;   VIDEO BRONCHOSCOPY WITH ENDOBRONCHIAL NAVIGATION N/A 07/29/2020   Procedure: VIDEO BRONCHOSCOPY WITH ENDOBRONCHIAL NAVIGATION;  Surgeon: Salena Saner, MD;  Location: [facility];  Service: Pulmonary;  Laterality: N/A;   VIDEO BRONCHOSCOPY WITH ENDOBRONCHIAL ULTRASOUND N/A 07/29/2020   Procedure: VIDEO BRONCHOSCOPY WITH ENDOBRONCHIAL ULTRASOUND;  Surgeon: Salena Saner, MD;  Location: [facility];  Service: Pulmonary;  Laterality: N/A;    Allergies  Allergen Reactions   Acetaminophen Shortness Of Breath   Codeine Itching and Nausea And Vomiting   Opana [Oxymorphone] Swelling    Current Outpatient Medications  Medication Sig Dispense Refill   albuterol (VENTOLIN HFA) 108 (90 Base) MCG/ACT inhaler Inhale 2 puffs into the lungs every 4 (four) hours as needed for wheezing or shortness of breath.     baclofen (LIORESAL) 10 MG tablet Take 10 mg by mouth 4 (four) times daily. STARTING AT LUNCHTIME     desvenlafaxine (PRISTIQ) 100 MG 24 hr tablet Take 1 tablet (100 mg total) by mouth daily. 90 tablet 1   fentaNYL (DURAGESIC - DOSED MCG/HR) 25 MCG/HR  Patient ID: Joshua Guerrero, male   DOB: [birth year], 67 y.o.   MRN: [id number]  Chief Complaint: Right groin pain  History of Present Illness Joshua Guerrero is a 67 y.o. male with history of right groin pain with associated bulge getting worse over the last 2 months.  Here with reports he cannot push it in.  Denies nausea, vomiting, fevers or chills.  Reports a persistent soreness/swelling with a burning sensation.  Reports he can merely go walking his day dog to provoke and exacerbate the pain.  He has a history of chronic neck and back pain following an accident, and is currently receiving the fentanyl patch prescribed from a  pain specialist.  Denies any constipation or difficulty voiding.  Previous abdominal surgery or inguinal hernia surgery.  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   Sleep apnea    Not currently using a C-Pap      Past Surgical History:  Procedure Laterality Date   COLONOSCOPY WITH PROPOFOL N/A 02/11/2015   Procedure: COLONOSCOPY WITH PROPOFOL;  Surgeon: Midge Minium, MD;  Location: [facility];  Service: Endoscopy;  Laterality: N/A;  USES C-PAP   ESOPHAGEAL DILATION N/A 10/14/2018   Procedure: ESOPHAGEAL DILATION;  Surgeon: Midge Minium, MD;  Location: [facility];  Service: Endoscopy;  Laterality: N/A;   ESOPHAGOGASTRODUODENOSCOPY (EGD) WITH PROPOFOL N/A 10/14/2018   Procedure: ESOPHAGOGASTRODUODENOSCOPY (EGD) WITH BIOPSY;  Surgeon: Midge Minium, MD;  Location: [facility];  Service: Endoscopy;  Laterality: N/A;  sleep  apnea   NASAL SINUS SURGERY     due to severe sleep apnea   NECK SURGERY     diffuse disc-and put in possibly screws patient thinks   SHOULDER SURGERY Bilateral    x 2   UMBILICAL HERNIA REPAIR N/A 02/26/2017   Primary repair.  Surgeon: Earline Mayotte, MD;  Location: [facility];  Service: General;  Laterality: N/A;   VIDEO BRONCHOSCOPY WITH ENDOBRONCHIAL NAVIGATION N/A 07/29/2020   Procedure: VIDEO BRONCHOSCOPY WITH ENDOBRONCHIAL NAVIGATION;  Surgeon: Salena Saner, MD;  Location: [facility];  Service: Pulmonary;  Laterality: N/A;   VIDEO BRONCHOSCOPY WITH ENDOBRONCHIAL ULTRASOUND N/A 07/29/2020   Procedure: VIDEO BRONCHOSCOPY WITH ENDOBRONCHIAL ULTRASOUND;  Surgeon: Salena Saner, MD;  Location: [facility];  Service: Pulmonary;  Laterality: N/A;    Allergies  Allergen Reactions   Acetaminophen Shortness Of Breath   Codeine Itching and Nausea And Vomiting   Opana [Oxymorphone] Swelling    Current Outpatient Medications  Medication Sig Dispense Refill   albuterol (VENTOLIN HFA) 108 (90 Base) MCG/ACT inhaler Inhale 2 puffs into the lungs every 4 (four) hours as needed for wheezing or shortness of breath.     baclofen (LIORESAL) 10 MG tablet Take 10 mg by mouth 4 (four) times daily. STARTING AT LUNCHTIME     desvenlafaxine (PRISTIQ) 100 MG 24 hr tablet Take 1 tablet (100 mg total) by mouth daily. 90 tablet 1   fentaNYL (DURAGESIC - DOSED MCG/HR) 25 MCG/HR  Patient ID: Joshua Guerrero, male   DOB: [birth year], 67 y.o.   MRN: [id number]  Chief Complaint: Right groin pain  History of Present Illness Joshua Guerrero is a 67 y.o. male with history of right groin pain with associated bulge getting worse over the last 2 months.  Here with reports he cannot push it in.  Denies nausea, vomiting, fevers or chills.  Reports a persistent soreness/swelling with a burning sensation.  Reports he can merely go walking his day dog to provoke and exacerbate the pain.  He has a history of chronic neck and back pain following an accident, and is currently receiving the fentanyl patch prescribed from a  pain specialist.  Denies any constipation or difficulty voiding.  Previous abdominal surgery or inguinal hernia surgery.  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   Sleep apnea    Not currently using a C-Pap      Past Surgical History:  Procedure Laterality Date   COLONOSCOPY WITH PROPOFOL N/A 02/11/2015   Procedure: COLONOSCOPY WITH PROPOFOL;  Surgeon: Midge Minium, MD;  Location: [facility];  Service: Endoscopy;  Laterality: N/A;  USES C-PAP   ESOPHAGEAL DILATION N/A 10/14/2018   Procedure: ESOPHAGEAL DILATION;  Surgeon: Midge Minium, MD;  Location: [facility];  Service: Endoscopy;  Laterality: N/A;   ESOPHAGOGASTRODUODENOSCOPY (EGD) WITH PROPOFOL N/A 10/14/2018   Procedure: ESOPHAGOGASTRODUODENOSCOPY (EGD) WITH BIOPSY;  Surgeon: Midge Minium, MD;  Location: [facility];  Service: Endoscopy;  Laterality: N/A;  sleep  apnea   NASAL SINUS SURGERY     due to severe sleep apnea   NECK SURGERY     diffuse disc-and put in possibly screws patient thinks   SHOULDER SURGERY Bilateral    x 2   UMBILICAL HERNIA REPAIR N/A 02/26/2017   Primary repair.  Surgeon: Earline Mayotte, MD;  Location: [facility];  Service: General;  Laterality: N/A;   VIDEO BRONCHOSCOPY WITH ENDOBRONCHIAL NAVIGATION N/A 07/29/2020   Procedure: VIDEO BRONCHOSCOPY WITH ENDOBRONCHIAL NAVIGATION;  Surgeon: Salena Saner, MD;  Location: [facility];  Service: Pulmonary;  Laterality: N/A;   VIDEO BRONCHOSCOPY WITH ENDOBRONCHIAL ULTRASOUND N/A 07/29/2020   Procedure: VIDEO BRONCHOSCOPY WITH ENDOBRONCHIAL ULTRASOUND;  Surgeon: Salena Saner, MD;  Location: [facility];  Service: Pulmonary;  Laterality: N/A;    Allergies  Allergen Reactions   Acetaminophen Shortness Of Breath   Codeine Itching and Nausea And Vomiting   Opana [Oxymorphone] Swelling    Current Outpatient Medications  Medication Sig Dispense Refill   albuterol (VENTOLIN HFA) 108 (90 Base) MCG/ACT inhaler Inhale 2 puffs into the lungs every 4 (four) hours as needed for wheezing or shortness of breath.     baclofen (LIORESAL) 10 MG tablet Take 10 mg by mouth 4 (four) times daily. STARTING AT LUNCHTIME     desvenlafaxine (PRISTIQ) 100 MG 24 hr tablet Take 1 tablet (100 mg total) by mouth daily. 90 tablet 1   fentaNYL (DURAGESIC - DOSED MCG/HR) 25 MCG/HR

## 2023-02-16 NOTE — Telephone Encounter (Signed)
Mailbox full, unable to leave message.  If patient calls, please inform him of the following regarding scheduled surgery with Dr. Claudine Mouton.   Pre-Admission date/time, and Surgery date at [facility].  Surgery Date: 02/22/23 Preadmission Testing Date: 02/18/23 (phone 1p-4p)  Also patient will need to call at [phone], between 1-3:00pm the day before surgery, to find out what time to arrive for surgery.

## 2023-02-16 NOTE — Patient Instructions (Signed)
You have chose to have your hernia repaired. This will be done by Dr. Claudine Mouton at [facility].  Please see your (blue) Pre-care information that you have been given today. Our surgery scheduler will call you to verify surgery date and to go over information.   You will need to arrange to be out of work for approximately 1-2 weeks and then you may return with a lifting restriction for 4 more weeks. If you have FMLA or Disability paperwork that needs to be filled out, please have your company fax your paperwork to [phone] or you may drop this by either office. This paperwork will be filled out within 3 days after your surgery has been completed.  You may have a bruise in your groin and also swelling and brusing in your testicle area. You may use ice 4-5 times daily for 15-20 minutes each time. Make sure that you place a barrier between you and the ice pack. To decrease the swelling, you may roll up a bath towel and place it vertically in between your thighs with your testicles resting on the towel. You will want to keep this area elevated as much as possible for several days following surgery.     Inguinal Hernia, Adult Muscles help keep everything in the body in its proper place. But if a weak spot in the muscles develops, something can poke through. That is called a hernia. When this happens in the lower part of the belly (abdomen), it is called an inguinal hernia. (It takes its name from a part of the body in this region called the inguinal canal.) A weak spot in the wall of muscles lets some fat or part of the small intestine bulge through. An inguinal hernia can develop at any age. Men get them more often than women. CAUSES  In adults, an inguinal hernia develops over time. It can be triggered by: Suddenly straining the muscles of the lower abdomen. Lifting heavy objects. Straining to have a bowel movement. Difficult bowel movements (constipation) can lead to this. Constant coughing. This may  be caused by smoking or lung disease. Being overweight. Being pregnant. Working at a job that requires long periods of standing or heavy lifting. Having had an inguinal hernia before. One type can be an emergency situation. It is called a strangulated inguinal hernia. It develops if part of the small intestine slips through the weak spot and cannot get back into the abdomen. The blood supply can be cut off. If that happens, part of the intestine may die. This situation requires emergency surgery. SYMPTOMS  Often, a small inguinal hernia has no symptoms. It is found when a healthcare provider does a physical exam. Larger hernias usually have symptoms.  In adults, symptoms may include: A lump in the groin. This is easier to see when the person is standing. It might disappear when lying down. In men, a lump in the scrotum. Pain or burning in the groin. This occurs especially when lifting, straining or coughing. A dull ache or feeling of pressure in the groin. Signs of a strangulated hernia can include: A bulge in the groin that becomes very painful and tender to the touch. A bulge that turns red or purple. Fever, nausea and vomiting. Inability to have a bowel movement or to pass gas. DIAGNOSIS  To decide if you have an inguinal hernia, a healthcare provider will probably do a physical examination. This will include asking questions about any symptoms you have noticed. The healthcare provider might  feel the groin area and ask you to cough. If an inguinal hernia is felt, the healthcare provider may try to slide it back into the abdomen. Usually no other tests are needed. TREATMENT  Treatments can vary. The size of the hernia makes a difference. Options include: Watchful waiting. This is often suggested if the hernia is small and you have had no symptoms. No medical procedure will be done unless symptoms develop. You will need to watch closely for symptoms. If any occur, contact your healthcare  provider right away. Surgery. This is used if the hernia is larger or you have symptoms. Open surgery. This is usually an outpatient procedure (you will not stay overnight in a hospital). An cut (incision) is made through the skin in the groin. The hernia is put back inside the abdomen. The weak area in the muscles is then repaired by herniorrhaphy or hernioplasty. Herniorrhaphy: in this type of surgery, the weak muscles are sewn back together. Hernioplasty: a patch or mesh is used to close the weak area in the abdominal wall. Laparoscopy. In this procedure, a surgeon makes small incisions. A thin tube with a tiny video camera (called a laparoscope) is put into the abdomen. The surgeon repairs the hernia with mesh by looking with the video camera and using two long instruments. HOME CARE INSTRUCTIONS  After surgery to repair an inguinal hernia: You will need to take pain medicine prescribed by your healthcare provider. Follow all directions carefully. You will need to take care of the wound from the incision. Your activity will be restricted for awhile. This will probably include no heavy lifting for several weeks. You also should not do anything too active for a few weeks. When you can return to work will depend on the type of job that you have. During "watchful waiting" periods, you should: Maintain a healthy weight. Eat a diet high in fiber (fruits, vegetables and whole grains). Drink plenty of fluids to avoid constipation. This means drinking enough water and other liquids to keep your urine clear or pale yellow. Do not lift heavy objects. Do not stand for long periods of time. Quit smoking. This should keep you from developing a frequent cough. SEEK MEDICAL CARE IF:  A bulge develops in your groin area. You feel pain, a burning sensation or pressure in the groin. This might be worse if you are lifting or straining. You develop a fever of more than 100.5 F (38.1 C). SEEK IMMEDIATE MEDICAL  CARE IF:  Pain in the groin increases suddenly. A bulge in the groin gets bigger suddenly and does not go down. For men, there is sudden pain in the scrotum. Or, the size of the scrotum increases. A bulge in the groin area becomes red or purple and is painful to touch. You have nausea or vomiting that does not go away. You feel your heart beating much faster than normal. You cannot have a bowel movement or pass gas. You develop a fever of more than 102.0 F (38.9 C).   This information is not intended to replace advice given to you by your health care provider. Make sure you discuss any questions you have with your health care provider.   Document Released: 09/06/2008 Document Revised: 07/13/2011 Document Reviewed: 10/22/2014 Else[employer] Interactive Patient Education [employer]

## 2023-02-16 NOTE — H&P (View-Only) (Signed)
Patient ID: Joshua Guerrero, male   DOB: [birth year], 67 y.o.   MRN: [id number]  Chief Complaint: Right groin pain  History of Present Illness Joshua Guerrero is a 67 y.o. male with history of right groin pain with associated bulge getting worse over the last 2 months.  Here with reports he cannot push it in.  Denies nausea, vomiting, fevers or chills.  Reports a persistent soreness/swelling with a burning sensation.  Reports he can merely go walking his day dog to provoke and exacerbate the pain.  He has a history of chronic neck and back pain following an accident, and is currently receiving the fentanyl patch prescribed from a  pain specialist.  Denies any constipation or difficulty voiding.  Previous abdominal surgery or inguinal hernia surgery.  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   Sleep apnea    Not currently using a C-Pap      Past Surgical History:  Procedure Laterality Date   COLONOSCOPY WITH PROPOFOL N/A 02/11/2015   Procedure: COLONOSCOPY WITH PROPOFOL;  Surgeon: Midge Minium, MD;  Location: [facility];  Service: Endoscopy;  Laterality: N/A;  USES C-PAP   ESOPHAGEAL DILATION N/A 10/14/2018   Procedure: ESOPHAGEAL DILATION;  Surgeon: Midge Minium, MD;  Location: [facility];  Service: Endoscopy;  Laterality: N/A;   ESOPHAGOGASTRODUODENOSCOPY (EGD) WITH PROPOFOL N/A 10/14/2018   Procedure: ESOPHAGOGASTRODUODENOSCOPY (EGD) WITH BIOPSY;  Surgeon: Midge Minium, MD;  Location: [facility];  Service: Endoscopy;  Laterality: N/A;  sleep  apnea   NASAL SINUS SURGERY     due to severe sleep apnea   NECK SURGERY     diffuse disc-and put in possibly screws patient thinks   SHOULDER SURGERY Bilateral    x 2   UMBILICAL HERNIA REPAIR N/A 02/26/2017   Primary repair.  Surgeon: Earline Mayotte, MD;  Location: [facility];  Service: General;  Laterality: N/A;   VIDEO BRONCHOSCOPY WITH ENDOBRONCHIAL NAVIGATION N/A 07/29/2020   Procedure: VIDEO BRONCHOSCOPY WITH ENDOBRONCHIAL NAVIGATION;  Surgeon: Salena Saner, MD;  Location: [facility];  Service: Pulmonary;  Laterality: N/A;   VIDEO BRONCHOSCOPY WITH ENDOBRONCHIAL ULTRASOUND N/A 07/29/2020   Procedure: VIDEO BRONCHOSCOPY WITH ENDOBRONCHIAL ULTRASOUND;  Surgeon: Salena Saner, MD;  Location: [facility];  Service: Pulmonary;  Laterality: N/A;    Allergies  Allergen Reactions   Acetaminophen Shortness Of Breath   Codeine Itching and Nausea And Vomiting   Opana [Oxymorphone] Swelling    Current Outpatient Medications  Medication Sig Dispense Refill   albuterol (VENTOLIN HFA) 108 (90 Base) MCG/ACT inhaler Inhale 2 puffs into the lungs every 4 (four) hours as needed for wheezing or shortness of breath.     baclofen (LIORESAL) 10 MG tablet Take 10 mg by mouth 4 (four) times daily. STARTING AT LUNCHTIME     desvenlafaxine (PRISTIQ) 100 MG 24 hr tablet Take 1 tablet (100 mg total) by mouth daily. 90 tablet 1   fentaNYL (DURAGESIC - DOSED MCG/HR) 25 MCG/HR  Patient ID: Joshua Guerrero, male   DOB: [birth year], 67 y.o.   MRN: [id number]  Chief Complaint: Right groin pain  History of Present Illness Joshua Guerrero is a 67 y.o. male with history of right groin pain with associated bulge getting worse over the last 2 months.  Here with reports he cannot push it in.  Denies nausea, vomiting, fevers or chills.  Reports a persistent soreness/swelling with a burning sensation.  Reports he can merely go walking his day dog to provoke and exacerbate the pain.  He has a history of chronic neck and back pain following an accident, and is currently receiving the fentanyl patch prescribed from a  pain specialist.  Denies any constipation or difficulty voiding.  Previous abdominal surgery or inguinal hernia surgery.  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   Sleep apnea    Not currently using a C-Pap      Past Surgical History:  Procedure Laterality Date   COLONOSCOPY WITH PROPOFOL N/A 02/11/2015   Procedure: COLONOSCOPY WITH PROPOFOL;  Surgeon: Midge Minium, MD;  Location: [facility];  Service: Endoscopy;  Laterality: N/A;  USES C-PAP   ESOPHAGEAL DILATION N/A 10/14/2018   Procedure: ESOPHAGEAL DILATION;  Surgeon: Midge Minium, MD;  Location: [facility];  Service: Endoscopy;  Laterality: N/A;   ESOPHAGOGASTRODUODENOSCOPY (EGD) WITH PROPOFOL N/A 10/14/2018   Procedure: ESOPHAGOGASTRODUODENOSCOPY (EGD) WITH BIOPSY;  Surgeon: Midge Minium, MD;  Location: [facility];  Service: Endoscopy;  Laterality: N/A;  sleep  apnea   NASAL SINUS SURGERY     due to severe sleep apnea   NECK SURGERY     diffuse disc-and put in possibly screws patient thinks   SHOULDER SURGERY Bilateral    x 2   UMBILICAL HERNIA REPAIR N/A 02/26/2017   Primary repair.  Surgeon: Earline Mayotte, MD;  Location: [facility];  Service: General;  Laterality: N/A;   VIDEO BRONCHOSCOPY WITH ENDOBRONCHIAL NAVIGATION N/A 07/29/2020   Procedure: VIDEO BRONCHOSCOPY WITH ENDOBRONCHIAL NAVIGATION;  Surgeon: Salena Saner, MD;  Location: [facility];  Service: Pulmonary;  Laterality: N/A;   VIDEO BRONCHOSCOPY WITH ENDOBRONCHIAL ULTRASOUND N/A 07/29/2020   Procedure: VIDEO BRONCHOSCOPY WITH ENDOBRONCHIAL ULTRASOUND;  Surgeon: Salena Saner, MD;  Location: [facility];  Service: Pulmonary;  Laterality: N/A;    Allergies  Allergen Reactions   Acetaminophen Shortness Of Breath   Codeine Itching and Nausea And Vomiting   Opana [Oxymorphone] Swelling    Current Outpatient Medications  Medication Sig Dispense Refill   albuterol (VENTOLIN HFA) 108 (90 Base) MCG/ACT inhaler Inhale 2 puffs into the lungs every 4 (four) hours as needed for wheezing or shortness of breath.     baclofen (LIORESAL) 10 MG tablet Take 10 mg by mouth 4 (four) times daily. STARTING AT LUNCHTIME     desvenlafaxine (PRISTIQ) 100 MG 24 hr tablet Take 1 tablet (100 mg total) by mouth daily. 90 tablet 1   fentaNYL (DURAGESIC - DOSED MCG/HR) 25 MCG/HR  Patient ID: Joshua Guerrero, male   DOB: [birth year], 67 y.o.   MRN: [id number]  Chief Complaint: Right groin pain  History of Present Illness Joshua Guerrero is a 67 y.o. male with history of right groin pain with associated bulge getting worse over the last 2 months.  Here with reports he cannot push it in.  Denies nausea, vomiting, fevers or chills.  Reports a persistent soreness/swelling with a burning sensation.  Reports he can merely go walking his day dog to provoke and exacerbate the pain.  He has a history of chronic neck and back pain following an accident, and is currently receiving the fentanyl patch prescribed from a  pain specialist.  Denies any constipation or difficulty voiding.  Previous abdominal surgery or inguinal hernia surgery.  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   Sleep apnea    Not currently using a C-Pap      Past Surgical History:  Procedure Laterality Date   COLONOSCOPY WITH PROPOFOL N/A 02/11/2015   Procedure: COLONOSCOPY WITH PROPOFOL;  Surgeon: Midge Minium, MD;  Location: [facility];  Service: Endoscopy;  Laterality: N/A;  USES C-PAP   ESOPHAGEAL DILATION N/A 10/14/2018   Procedure: ESOPHAGEAL DILATION;  Surgeon: Midge Minium, MD;  Location: [facility];  Service: Endoscopy;  Laterality: N/A;   ESOPHAGOGASTRODUODENOSCOPY (EGD) WITH PROPOFOL N/A 10/14/2018   Procedure: ESOPHAGOGASTRODUODENOSCOPY (EGD) WITH BIOPSY;  Surgeon: Midge Minium, MD;  Location: [facility];  Service: Endoscopy;  Laterality: N/A;  sleep  apnea   NASAL SINUS SURGERY     due to severe sleep apnea   NECK SURGERY     diffuse disc-and put in possibly screws patient thinks   SHOULDER SURGERY Bilateral    x 2   UMBILICAL HERNIA REPAIR N/A 02/26/2017   Primary repair.  Surgeon: Earline Mayotte, MD;  Location: [facility];  Service: General;  Laterality: N/A;   VIDEO BRONCHOSCOPY WITH ENDOBRONCHIAL NAVIGATION N/A 07/29/2020   Procedure: VIDEO BRONCHOSCOPY WITH ENDOBRONCHIAL NAVIGATION;  Surgeon: Salena Saner, MD;  Location: [facility];  Service: Pulmonary;  Laterality: N/A;   VIDEO BRONCHOSCOPY WITH ENDOBRONCHIAL ULTRASOUND N/A 07/29/2020   Procedure: VIDEO BRONCHOSCOPY WITH ENDOBRONCHIAL ULTRASOUND;  Surgeon: Salena Saner, MD;  Location: [facility];  Service: Pulmonary;  Laterality: N/A;    Allergies  Allergen Reactions   Acetaminophen Shortness Of Breath   Codeine Itching and Nausea And Vomiting   Opana [Oxymorphone] Swelling    Current Outpatient Medications  Medication Sig Dispense Refill   albuterol (VENTOLIN HFA) 108 (90 Base) MCG/ACT inhaler Inhale 2 puffs into the lungs every 4 (four) hours as needed for wheezing or shortness of breath.     baclofen (LIORESAL) 10 MG tablet Take 10 mg by mouth 4 (four) times daily. STARTING AT LUNCHTIME     desvenlafaxine (PRISTIQ) 100 MG 24 hr tablet Take 1 tablet (100 mg total) by mouth daily. 90 tablet 1   fentaNYL (DURAGESIC - DOSED MCG/HR) 25 MCG/HR  Patient ID: Joshua Guerrero, male   DOB: [birth year], 67 y.o.   MRN: [id number]  Chief Complaint: Right groin pain  History of Present Illness Joshua Guerrero is a 67 y.o. male with history of right groin pain with associated bulge getting worse over the last 2 months.  Here with reports he cannot push it in.  Denies nausea, vomiting, fevers or chills.  Reports a persistent soreness/swelling with a burning sensation.  Reports he can merely go walking his day dog to provoke and exacerbate the pain.  He has a history of chronic neck and back pain following an accident, and is currently receiving the fentanyl patch prescribed from a  pain specialist.  Denies any constipation or difficulty voiding.  Previous abdominal surgery or inguinal hernia surgery.  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   Sleep apnea    Not currently using a C-Pap      Past Surgical History:  Procedure Laterality Date   COLONOSCOPY WITH PROPOFOL N/A 02/11/2015   Procedure: COLONOSCOPY WITH PROPOFOL;  Surgeon: Midge Minium, MD;  Location: [facility];  Service: Endoscopy;  Laterality: N/A;  USES C-PAP   ESOPHAGEAL DILATION N/A 10/14/2018   Procedure: ESOPHAGEAL DILATION;  Surgeon: Midge Minium, MD;  Location: [facility];  Service: Endoscopy;  Laterality: N/A;   ESOPHAGOGASTRODUODENOSCOPY (EGD) WITH PROPOFOL N/A 10/14/2018   Procedure: ESOPHAGOGASTRODUODENOSCOPY (EGD) WITH BIOPSY;  Surgeon: Midge Minium, MD;  Location: [facility];  Service: Endoscopy;  Laterality: N/A;  sleep  apnea   NASAL SINUS SURGERY     due to severe sleep apnea   NECK SURGERY     diffuse disc-and put in possibly screws patient thinks   SHOULDER SURGERY Bilateral    x 2   UMBILICAL HERNIA REPAIR N/A 02/26/2017   Primary repair.  Surgeon: Earline Mayotte, MD;  Location: [facility];  Service: General;  Laterality: N/A;   VIDEO BRONCHOSCOPY WITH ENDOBRONCHIAL NAVIGATION N/A 07/29/2020   Procedure: VIDEO BRONCHOSCOPY WITH ENDOBRONCHIAL NAVIGATION;  Surgeon: Salena Saner, MD;  Location: [facility];  Service: Pulmonary;  Laterality: N/A;   VIDEO BRONCHOSCOPY WITH ENDOBRONCHIAL ULTRASOUND N/A 07/29/2020   Procedure: VIDEO BRONCHOSCOPY WITH ENDOBRONCHIAL ULTRASOUND;  Surgeon: Salena Saner, MD;  Location: [facility];  Service: Pulmonary;  Laterality: N/A;    Allergies  Allergen Reactions   Acetaminophen Shortness Of Breath   Codeine Itching and Nausea And Vomiting   Opana [Oxymorphone] Swelling    Current Outpatient Medications  Medication Sig Dispense Refill   albuterol (VENTOLIN HFA) 108 (90 Base) MCG/ACT inhaler Inhale 2 puffs into the lungs every 4 (four) hours as needed for wheezing or shortness of breath.     baclofen (LIORESAL) 10 MG tablet Take 10 mg by mouth 4 (four) times daily. STARTING AT LUNCHTIME     desvenlafaxine (PRISTIQ) 100 MG 24 hr tablet Take 1 tablet (100 mg total) by mouth daily. 90 tablet 1   fentaNYL (DURAGESIC - DOSED MCG/HR) 25 MCG/HR

## 2023-02-17 NOTE — Telephone Encounter (Signed)
Left message with daughter, Philippa Chester. She will try and get in touch with her dad and have him to call us.  His voice mailbox is still full and we can't get in touch with him regarding his surgery.

## 2023-02-18 ENCOUNTER — Encounter
Admission: RE | Admit: 2023-02-18 | Discharge: 2023-02-18 | Disposition: A | Discharge status: Home / Self Care | Payer: Medicare HMO | Source: Ambulatory Visit | Attending: Surgery | Admitting: Surgery

## 2023-02-18 ENCOUNTER — Other Ambulatory Visit: Payer: Self-pay

## 2023-02-18 DIAGNOSIS — I10 Essential (primary) hypertension: Secondary | ICD-10-CM

## 2023-02-18 HISTORY — DX: Pneumonia, unspecified organism: J18.9

## 2023-02-18 HISTORY — DX: Chronic obstructive pulmonary disease, unspecified: J44.9

## 2023-02-18 HISTORY — DX: Dyspnea, unspecified: R06.00

## 2023-02-18 HISTORY — DX: Unspecified asthma, uncomplicated: J45.909

## 2023-02-18 HISTORY — DX: Personal history of urinary calculi: Z87.442

## 2023-02-18 NOTE — Patient Instructions (Addendum)
Your procedure is scheduled on: 02/22/23 - Monday Report to the Registration Desk on the 1st floor of the [facility]. To find out your arrival time, please call [phone] between 1PM - 3PM on: 02/19/23 - Friday If your arrival time is 6:00 am, do not arrive before that time as the [facility] entrance doors do not open until 6:00 am.  REMEMBER: Instructions that are not followed completely may result in serious medical risk, up to and including death; or upon the discretion of your surgeon and anesthesiologist your surgery may need to be rescheduled.  Do not eat food or drink any liquids after midnight the night before surgery.  No gum chewing or hard candies.   One week prior to surgery: Stop Anti-inflammatories (NSAIDS) such as Advil, Aleve, Ibuprofen, Motrin, Naproxen, Naprosyn and Aspirin based products such as Excedrin, Goody's Powder, BC Powder. You may take Tylenol if needed for pain up until the day of surgery.  Stop ANY OVER THE COUNTER supplements until after surgery.   ON THE DAY OF SURGERY ONLY TAKE THESE MEDICATIONS WITH SIPS OF WATER:  desvenlafaxine (PRISTIQ)  metoprolol succinate (TOPROL ) omeprazole (PRILOSEC)  oxyCODONE-acetaminophen (PERCOCET)    Use albuterol (VENTOLIN HFA)  on the day of surgery and bring to the hospital.  No Alcohol for 24 hours before or after surgery.  No Smoking including e-cigarettes for 24 hours before surgery.  No chewable tobacco products for at least 6 hours before surgery.  No nicotine patches on the day of surgery.  Do not use any "recreational" drugs for at least a week (preferably 2 weeks) before your surgery.  Please be advised that the combination of cocaine and anesthesia may have negative outcomes, up to and including death. If you test positive for cocaine, your surgery will be cancelled.  On the morning of surgery brush your teeth with toothpaste and water, you may rinse your mouth with mouthwash if you wish. Do  not swallow any toothpaste or mouthwash.  Use CHG Soap or wipes as directed on instruction sheet.  Do not wear jewelry, make-up, hairpins, clips or nail polish.  For welded (permanent) jewelry: bracelets, anklets, waist bands, etc.  Please have this removed prior to surgery.  If it is not removed, there is a chance that hospital personnel will need to cut it off on the day of surgery.  Do not wear lotions, powders, or perfumes.   Do not shave body hair from the neck down 48 hours before surgery.  Contact lenses, hearing aids and dentures may not be worn into surgery.  Do not bring valuables to the hospital. [facility] is not responsible for any missing/lost belongings or valuables.   Notify your doctor if there is any change in your medical condition (cold, fever, infection).  Wear comfortable clothing (specific to your surgery type) to the hospital.  After surgery, you can help prevent lung complications by doing breathing exercises.  Take deep breaths and cough every 1-2 hours. Your doctor may order a device called an Incentive Spirometer to help you take deep breaths. When coughing or sneezing, hold a pillow firmly against your incision with both hands. This is called "splinting." Doing this helps protect your incision. It also decreases belly discomfort.  If you are being admitted to the hospital overnight, leave your suitcase in the car. After surgery it may be brought to your room.  In case of increased patient census, it may be necessary for you, the patient, to continue your postoperative  care in the Same Day Surgery department.  If you are being discharged the day of surgery, you will not be allowed to drive home. You will need a responsible individual to drive you home and stay with you for 24 hours after surgery.   If you are taking public transportation, you will need to have a responsible individual with you.  Please call the Pre-admissions Testing Dept. at [phone] if you have any questions about these instructions.  Surgery Visitation Policy:  Patients having surgery or a procedure may have two visitors.  Children under the age of 63 must have an adult with them who is not the patient.  Inpatient Visitation:    Visiting hours are 7 a.m. to 8 p.m. Up to four visitors are allowed at one time in a patient room. The visitors may rotate out with other people during the day.  One visitor age 57 or older may stay with the patient overnight and must be in the room by 8 p.m.     Preparing for Surgery with CHLORHEXIDINE GLUCONATE (CHG) Soap  Chlorhexidine Gluconate (CHG) Soap  o An antiseptic cleaner that kills germs and bonds with the skin to continue killing germs even after washing  o Used for showering the night before surgery and morning of surgery  Before surgery, you can play an important role by reducing the number of germs on your skin.  CHG (Chlorhexidine gluconate) soap is an antiseptic cleanser which kills germs and bonds with the skin to continue killing germs even after washing.  Please do not use if you have an allergy to CHG or antibacterial soaps. If your skin becomes reddened/irritated stop using the CHG.  1. Shower the NIGHT BEFORE SURGERY and the MORNING OF SURGERY with CHG soap.  2. If you choose to wash your hair, wash your hair first as usual with your normal shampoo.  3. After shampooing, rinse your hair and body thoroughly to remove the shampoo.  4. Use CHG as you would any other liquid soap. You can apply CHG directly to the skin and wash gently with a scrungie or a clean washcloth.  5. Apply the CHG soap to your body only from the neck down. Do not use on open wounds or open sores. Avoid contact with your eyes, ears, mouth, and genitals (private parts). Wash face and genitals (private parts) with your normal soap.  6. Wash thoroughly, paying special attention to the area where your surgery will be  performed.  7. Thoroughly rinse your body with warm water.  8. Do not shower/wash with your normal soap after using and rinsing off the CHG soap.  9. Pat yourself dry with a clean towel.  10. Wear clean pajamas to bed the night before surgery.  12. Place clean sheets on your bed the night of your first shower and do not sleep with pets.  13. Shower again with the CHG soap on the day of surgery prior to arriving at the hospital.  14. Do not apply any deodorants/lotions/powders.  15. Please wear clean clothes to the hospital.

## 2023-02-18 NOTE — Telephone Encounter (Signed)
Patient is called again, he is now informed of all dates regarding his surgery.

## 2023-02-19 ENCOUNTER — Telehealth: Payer: Self-pay | Admitting: Surgery

## 2023-02-19 ENCOUNTER — Inpatient Hospital Stay: Admission: RE | Admit: 2023-02-19 | Payer: Medicare HMO | Source: Ambulatory Visit

## 2023-02-19 NOTE — Plan of Care (Signed)
CHL Tonsillectomy/Adenoidectomy, Postoperative PEDS care plan entered in error.

## 2023-02-19 NOTE — Telephone Encounter (Signed)
Updated information regarding rescheduled surgery due to patient being sick.  Patient is rescheduled to the following:  Patient has been advised of Pre-Admission date/time, and Surgery date at [facility].  Surgery Date: 03/05/23  Preadmission Testing Date: In person visit 03/01/23 arriving @ 7:45 am for 8:00 am appointment at the [facility].    Patient has been made aware to call [phone], between 1-3:00pm the day before surgery, to find out what time to arrive for surgery.

## 2023-02-24 ENCOUNTER — Ambulatory Visit (INDEPENDENT_AMBULATORY_CARE_PROVIDER_SITE_OTHER): Payer: Medicare HMO | Admitting: Physician Assistant

## 2023-02-24 ENCOUNTER — Encounter: Payer: Self-pay | Admitting: Physician Assistant

## 2023-02-24 ENCOUNTER — Telehealth: Payer: Self-pay | Admitting: *Deleted

## 2023-02-24 VITALS — BP 141/78 | HR 76 | Ht 69.0 in | Wt 146.0 lb

## 2023-02-24 DIAGNOSIS — K1379 Other lesions of oral mucosa: Secondary | ICD-10-CM | POA: Diagnosis not present

## 2023-02-24 MED ORDER — NYSTATIN 100000 UNIT/ML MT SUSP
5.0000 mL | Freq: Four times a day (QID) | ORAL | 0 refills | Status: DC
Start: 2023-02-24 — End: 2023-03-01

## 2023-02-24 NOTE — Telephone Encounter (Signed)
Radiology calling to verify report is available:  IMPRESSION: 1. Mild soft tissue thickening involving the right inguinal canal. This is nonspecific but could represent a small inguinal hernia. No evidence for bowel involvement. 2. Enlarging exophytic splenic mass in the left upper abdomen. This splenic mass now measures up to 9.4 cm and previously measured 7.3 cm in 2022. Limited evaluated of the splenic mass on this study without intravascular contrast. No fluid or blood surrounding this mass lesion. 3. Interstitial thickening in the right middle lobe is incompletely visualized and some of this could represent chronic changes but indeterminate. 4. Aortic Atherosclerosis (ICD10-I70.0).

## 2023-02-24 NOTE — Telephone Encounter (Signed)
Results reviewed and notable for mild tissue thickening in area of pain, possibly related to inguinal hernia also enlargement of splenic mass. Recommend pt schedule with general surgery. Referral was placed during recent visit. Per chart review, pt was planning to call back to schedule appt with surgeon for eval.   [name], MD  [facility]

## 2023-02-25 NOTE — Progress Notes (Signed)
Established patient visit  Patient: Joshua Guerrero   DOB: [birth year]   67 y.o. Male  MRN: [id number] Visit Date: 02/24/2023  Today's healthcare provider: Debera Lat, PA-C   Chief Complaint  Patient presents with   Mouth Lesions   Subjective     HPI   Pt stated-knot inside the tongue--painful, burning, worse when eating--2 weeks. Tried salt water, Rx ointment prescribe by dental. Last edited by Shelly Bombard, CMA on 02/24/2023  4:00 PM.      Discussed the use of AI scribe software for clinical note transcription with the patient, who gave verbal consent to proceed.  History of Present Illness   The patient, with a past medical history of oral cancer, presents with a two-week history of painful oral ulcers on the tongue. The condition has progressively worsened, affecting the patient's ability to eat. The patient denies any changes in oral hygiene products or dietary habits, but mentions starting to drink beer. The patient has tried using an ointment previously prescribed for oral yeast infections, but reports no improvement. 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           09/18/2022    1:51 PM 05/29/2022    1:34 PM 05/28/2022    1:14 PM  Depression screen PHQ 2/9  Decreased Interest 0 0 0  Down, Depressed, Hopeless 0 0 1  PHQ - 2 Score 0 0 1  Altered sleeping 0 0   Tired, decreased energy 0 0   Change in appetite 0 0   Feeling bad or failure about yourself  0 0   Trouble concentrating 0 0   Moving slowly or fidgety/restless 0 0   Suicidal thoughts 0 0   PHQ-9 Score 0 0   Difficult doing work/chores Not difficult at all Not difficult at all        No data to display          Medications: Outpatient Medications Prior to Visit  Medication Sig   albuterol (VENTOLIN HFA) 108 (90 Base) MCG/ACT inhaler Inhale 2 puffs into the lungs every 4 (four) hours as  needed for wheezing or shortness of breath.   Ascorbic Acid (VITAMIN C PO) Take 2 tablets by mouth daily after breakfast.   B Complex-C (B-COMPLEX WITH VITAMIN C) tablet Take 2 tablets by mouth daily after breakfast.   baclofen (LIORESAL) 10 MG tablet Take 10 mg by mouth in the morning, at noon, and at bedtime. STARTING AT LUNCHTIME   Cyanocobalamin (B-12 PO) Take 2 tablets by mouth daily after breakfast.   desvenlafaxine (PRISTIQ) 100 MG 24 hr tablet Take 1 tablet (100 mg total) by mouth daily.   fentaNYL (DURAGESIC - DOSED MCG/HR) 25 MCG/HR patch Place 1 patch onto the skin every 3 (three) days.   ferrous sulfate 324 MG TBEC Take 324 mg by mouth daily after breakfast.   meloxicam (MOBIC) 15 MG tablet Take 1 tablet (15 mg total) by mouth daily as needed for pain. (Patient taking differently: Take 15 mg by mouth in the morning.)   metoprolol succinate (TOPROL-XL) 50 MG 24 hr tablet Take with or immediately following a meal.   Multiple Vitamin (MULTIVITAMIN) tablet Take 1 tablet by mouth daily. Equate Senior   olmesartan (BENICAR) 5 MG tablet TAKE 2 TABLETS BY MOUTH DAILY   omeprazole (PRILOSEC) 40 MG capsule TAKE 1 CAPSULE DAILY   oxyCODONE-acetaminophen (PERCOCET) 10-325  MG tablet Take 1 tablet by mouth every 4 (four) hours. 3AM, 7AM, 11AM, 3 PM, 7PM AND 11PM   Potassium 99 MG TABS Take 4 tablets by mouth daily after breakfast.   prochlorperazine (COMPAZINE) 10 MG tablet Take 1 tablet (10 mg total) by mouth every 8 (eight) hours as needed.   promethazine (PHENERGAN) 25 MG tablet Take 1 tablet (25 mg total) by mouth every 8 (eight) hours as needed.   sucralfate (CARAFATE) 1 g tablet TAKE 1 TABLET BY MOUTH 4 TIMES A DAY WITH MEALS AND AT BEDTIME.   traZODone (DESYREL) 150 MG tablet Take 1 tablet (150 mg total) by mouth at bedtime. (Patient taking differently: Take 300 mg by mouth at bedtime.)   No facility-administered medications prior to visit.    Review of Systems  All other systems  reviewed and are negative.  Except see HPI       Objective    BP (!) 141/78 (BP Location: Left Arm, Patient Position: Sitting, Cuff Size: Normal)   Pulse 76   Ht 5\' 9"  (1.753 m)   Wt 146 lb (66.2 kg)   SpO2 98%   BMI 21.56 kg/m     Physical Exam Constitutional:      General: He is not in acute distress.    Appearance: Normal appearance. He is not diaphoretic.  HENT:     Head: Normocephalic.  Eyes:     Conjunctiva/sclera: Conjunctivae normal.  Pulmonary:     Effort: Pulmonary effort is normal. No respiratory distress.  Skin:    Findings: Lesion (sores on the lateral aspect of the tongue) present.  Neurological:     Mental Status: He is alert and oriented to person, place, and time. Mental status is at baseline.      No results found for any visits on 02/24/23.  Assessment & Plan        Oral Ulcers Painful oral ulcers present on the tongue for the past two weeks, affecting eating. No recent changes in oral hygiene products or diet. No recent antibiotic use. History of oral cancer and radiation therapy in his thirties. -English as a second language teacher with five ingredients to decrease pain, inflammation, and potential candida development. -Advise to avoid salty and spicy foods, and to maintain a liquid diet. -Check in one week to assess response to treatment. -If symptoms persist, consider postponing upcoming hernia surgery depending pain control  Upcoming Hernia Surgery Patient scheduled for hernia surgery next week. -Advise to monitor oral ulcer symptoms and consider postponing surgery if symptoms persist. -Pre-surgery follow-up to be scheduled.     No follow-ups on file.    The patient was advised to call back or seek an in-person evaluation if the symptoms worsen or if the condition fails to improve as anticipated.  I discussed the assessment and treatment plan with the patient. The patient was provided an opportunity to ask questions and all were answered. The  patient agreed with the plan and demonstrated an understanding of the instructions.  I, Debera Lat, PA-C have reviewed all documentation for this visit. The documentation on 02/24/23 for the exam, diagnosis, procedures, and orders are all accurate and complete.  Debera Lat, [facility], MMS [facility] [phone] (phone) [phone] (fax)   [facility]

## 2023-02-26 ENCOUNTER — Other Ambulatory Visit: Payer: Self-pay | Admitting: Family Medicine

## 2023-02-26 DIAGNOSIS — K219 Gastro-esophageal reflux disease without esophagitis: Secondary | ICD-10-CM

## 2023-02-26 NOTE — Telephone Encounter (Signed)
Requested Prescriptions  Pending Prescriptions Disp Refills   omeprazole (PRILOSEC) 40 MG capsule [Pharmacy Med Name: OMEPRAZOLE CAP 40MG ] 90 capsule 0    Sig: TAKE 1 CAPSULE DAILY     Gastroenterology: Proton Pump Inhibitors Passed - 02/26/2023  8:03 AM      Passed - Valid encounter within last 12 months    Recent Outpatient Visits           2 days ago Mouth sores   [facility] [place], [place], PA-C   1 month ago Bilateral recurrent inguinal hernia without obstruction or gangrene   [facility] Merita Norton T, FNP   1 month ago Flu vaccine need   [facility] [place], [place], PA-C   3 months ago Primary hypertension   [facility] [place], [place], PA-C   3 months ago Chest congestion   [place] [facility] [place], Tawanna Cooler, MD

## 2023-03-01 ENCOUNTER — Encounter
Admission: RE | Admit: 2023-03-01 | Discharge: 2023-03-01 | Disposition: A | Discharge status: Home / Self Care | Payer: Medicare HMO | Source: Ambulatory Visit | Attending: Surgery | Admitting: Surgery

## 2023-03-01 ENCOUNTER — Encounter: Payer: Self-pay | Admitting: Physician Assistant

## 2023-03-01 ENCOUNTER — Ambulatory Visit: Payer: Self-pay | Admitting: *Deleted

## 2023-03-01 ENCOUNTER — Ambulatory Visit (INDEPENDENT_AMBULATORY_CARE_PROVIDER_SITE_OTHER): Payer: Medicare HMO | Admitting: Physician Assistant

## 2023-03-01 ENCOUNTER — Telehealth: Payer: Self-pay | Admitting: Family Medicine

## 2023-03-01 VITALS — BP 173/88 | HR 64 | Temp 97.3°F | Resp 20 | Ht 69.0 in | Wt 150.7 lb

## 2023-03-01 DIAGNOSIS — R9431 Abnormal electrocardiogram [ECG] [EKG]: Secondary | ICD-10-CM | POA: Diagnosis not present

## 2023-03-01 DIAGNOSIS — Z01818 Encounter for other preprocedural examination: Secondary | ICD-10-CM | POA: Diagnosis not present

## 2023-03-01 DIAGNOSIS — I10 Essential (primary) hypertension: Secondary | ICD-10-CM | POA: Insufficient documentation

## 2023-03-01 DIAGNOSIS — K1379 Other lesions of oral mucosa: Secondary | ICD-10-CM | POA: Diagnosis not present

## 2023-03-01 DIAGNOSIS — Z01810 Encounter for preprocedural cardiovascular examination: Secondary | ICD-10-CM | POA: Diagnosis not present

## 2023-03-01 MED ORDER — NYSTATIN 100000 UNIT/ML MT SUSP: 5.0000 mL | Freq: Four times a day (QID) | OROMUCOSAL | 1 refills | Status: AC

## 2023-03-01 NOTE — Telephone Encounter (Signed)
  Chief Complaint: Mouth pain, ulcers are not resolving Symptoms: above Frequency: Ongoing Pertinent Negatives: Patient denies  Disposition: [] ED /[] Urgent Care (no appt availability in office) / [x] Appointment(In office/virtual)/ []  [facility]/ [] Home Care/ [] Refused Recommended Disposition /[] [facility]/ []  Follow-up with PCP Additional Notes: Pt called back. Pt states that magic mouthwash is reducing pain, but pain returns quickly. Pt states the ulcers are not resolving, and he is already out of the mouthwash.   Summary: Mouth Sores Advice   Pt was seen on 02/24/23 prescribed magic mouthwash. Reporting that it is not working. Please advise.     Reason for Disposition  Mouth ulcer lasts > 2 weeks  Answer Assessment - Initial Assessment Questions 1. LOCATION: "Where is the ulcer located?"      Mouth, tongue 2. NUMBER: "How many ulcers are there?"      Unsure  Protocols used: Mouth Ulcers-A-AH

## 2023-03-01 NOTE — Progress Notes (Signed)
Established patient visit  Patient: Joshua Guerrero   DOB: [birth year]   67 y.o. Male  MRN: [id number] Visit Date: 03/01/2023  Today's healthcare provider: Debera Lat, PA-C   Chief Complaint  Patient presents with   Pain    Mouth pain  for about 3 weeks pain in the tongue & herrea surgery this Friday Magic mouthwash helped but still there    Subjective     Discussed the use of AI scribe software for clinical note transcription with the patient, who gave verbal consent to proceed.  History of Present Illness   The patient presents with persistent throat discomfort despite treatment. She reports that the medication has provided some relief, but the discomfort is still present. She has not noticed any changes in the size of the discomfort area. The patient also reports severe hernia pain on the day of the visit. She has been scheduled for hernia surgery and feels prepared to undergo the procedure. The patient also mentions that she is out of her current medication.           09/18/2022    1:51 PM 05/29/2022    1:34 PM 05/28/2022    1:14 PM  Depression screen PHQ 2/9  Decreased Interest 0 0 0  Down, Depressed, Hopeless 0 0 1  PHQ - 2 Score 0 0 1  Altered sleeping 0 0   Tired, decreased energy 0 0   Change in appetite 0 0   Feeling bad or failure about yourself  0 0   Trouble concentrating 0 0   Moving slowly or fidgety/restless 0 0   Suicidal thoughts 0 0   PHQ-9 Score 0 0   Difficult doing work/chores Not difficult at all Not difficult at all        No data to display          Medications: Outpatient Medications Prior to Visit  Medication Sig   albuterol (VENTOLIN HFA) 108 (90 Base) MCG/ACT inhaler Inhale 2 puffs into the lungs every 4 (four) hours as needed for wheezing or shortness of breath.   Ascorbic Acid (VITAMIN C PO) Take 2 tablets by mouth daily after breakfast.   B Complex-C (B-COMPLEX WITH VITAMIN C) tablet Take 2 tablets by mouth daily after breakfast.    baclofen (LIORESAL) 10 MG tablet Take 10 mg by mouth in the morning, at noon, and at bedtime. STARTING AT LUNCHTIME   Cyanocobalamin (B-12 PO) Take 2 tablets by mouth daily after breakfast.   desvenlafaxine (PRISTIQ) 100 MG 24 hr tablet Take 1 tablet (100 mg total) by mouth daily.   fentaNYL (DURAGESIC - DOSED MCG/HR) 25 MCG/HR patch Place 1 patch onto the skin every 3 (three) days.   ferrous sulfate 324 MG TBEC Take 324 mg by mouth daily after breakfast.   meloxicam (MOBIC) 15 MG tablet Take 1 tablet (15 mg total) by mouth daily as needed for pain. (Patient taking differently: Take 15 mg by mouth in the morning.)   metoprolol succinate (TOPROL-XL) 50 MG 24 hr tablet Take with or immediately following a meal.   Multiple Vitamin (MULTIVITAMIN) tablet Take 1 tablet by mouth daily. Equate Senior   olmesartan (BENICAR) 5 MG tablet TAKE 2 TABLETS BY MOUTH DAILY   omeprazole (PRILOSEC) 40 MG capsule TAKE 1 CAPSULE DAILY   oxyCODONE-acetaminophen (PERCOCET) 10-325 MG tablet Take 1 tablet by mouth every 4 (four) hours. 3AM, 7AM, 11AM, 3 PM, 7PM AND 11PM   Potassium 99 MG TABS Take 4 tablets by mouth  daily after breakfast.   prochlorperazine (COMPAZINE) 10 MG tablet Take 1 tablet (10 mg total) by mouth every 8 (eight) hours as needed.   promethazine (PHENERGAN) 25 MG tablet Take 1 tablet (25 mg total) by mouth every 8 (eight) hours as needed.   sucralfate (CARAFATE) 1 g tablet TAKE 1 TABLET BY MOUTH 4 TIMES A DAY WITH MEALS AND AT BEDTIME.   traZODone (DESYREL) 150 MG tablet Take 1 tablet (150 mg total) by mouth at bedtime. (Patient taking differently: Take 300 mg by mouth at bedtime.)   [DISCONTINUED] magic mouthwash (nystatin, lidocaine, diphenhydrAMINE, alum & mag hydroxide) suspension Swish and spit 5 mLs 4 (four) times daily. Please, mix all ingredients in equal ratios   No facility-administered medications prior to visit.    Review of Systems  All other systems reviewed and are  negative.  Except see HPI       Objective    BP (!) 173/88 (BP Location: Left Arm, Patient Position: Sitting, Cuff Size: Normal)   Pulse 64   Temp (!) 97.3 F (36.3 C)   Resp 20   Ht 5\' 9"  (1.753 m)   Wt 150 lb 11.2 oz (68.4 kg)   SpO2 99%   BMI 22.25 kg/m     Physical Exam Constitutional:      General: He is not in acute distress.    Appearance: Normal appearance. He is not diaphoretic.  HENT:     Head: Normocephalic.     Mouth/Throat:     Comments: Mouth ulcers x2 on the right lateral aspect of the tongue Eyes:     Extraocular Movements: Extraocular movements intact.     Conjunctiva/sclera: Conjunctivae normal.     Pupils: Pupils are equal, round, and reactive to light.  Pulmonary:     Effort: Pulmonary effort is normal. No respiratory distress.  Neurological:     Mental Status: He is alert and oriented to person, place, and time. Mental status is at baseline.      No results found for any visits on 03/01/23.  Assessment & Plan        Oral Lesions Persistent despite medication. No history of cold sores or herpes infection. No trauma reported. Concern for possible malignancy. -Referral to ENT for further evaluation and possible biopsy. -Continue current medication and provide refill.  Hernia Patient reports significant pain today. Surgery scheduled for the near future. BP elevated because of pain. Advised an adherence to BP regimen -Continue current pain management regimen. -Proceed with scheduled surgery.  General Health Maintenance -Ensure patient informs surgical team of current health status prior to surgery.      No follow-ups on file.     The patient was advised to call back or seek an in-person evaluation if the symptoms worsen or if the condition fails to improve as anticipated.  I discussed the assessment and treatment plan with the patient. The patient was provided an opportunity to ask questions and all were answered. The patient agreed with  the plan and demonstrated an understanding of the instructions.  I, Debera Lat, PA-C have reviewed all documentation for this visit. The documentation on  03/01/23 for the exam, diagnosis, procedures, and orders are all accurate and complete.  Debera Lat, [facility], MMS [facility] [phone] (phone) [phone] (fax)  [facility]

## 2023-03-01 NOTE — Telephone Encounter (Signed)
Received a fax from covermymeds for promethazine HCI 25mg   Key: [facility]

## 2023-03-01 NOTE — Telephone Encounter (Signed)
Summary: Mouth Sores Advice   Pt was seen on 02/24/23 prescribed magic mouthwash. Reporting that it is not working. Please advise.     Attempted to contact patient- no answer- mailbox is full- unable to leave call back message

## 2023-03-05 ENCOUNTER — Telehealth: Payer: Self-pay | Admitting: Family Medicine

## 2023-03-05 ENCOUNTER — Encounter: Payer: Self-pay | Admitting: Surgery

## 2023-03-05 ENCOUNTER — Ambulatory Visit: Payer: Medicare HMO | Admitting: Urgent Care

## 2023-03-05 ENCOUNTER — Other Ambulatory Visit: Payer: Self-pay

## 2023-03-05 ENCOUNTER — Ambulatory Visit
Admission: RE | Admit: 2023-03-05 | Discharge: 2023-03-05 | Disposition: A | Discharge status: Home / Self Care | Payer: Medicare HMO | Attending: Surgery | Admitting: Surgery

## 2023-03-05 ENCOUNTER — Encounter
Admission: RE | Disposition: A | Discharge status: Home / Self Care | Payer: Self-pay | Source: Home / Self Care | Attending: Surgery

## 2023-03-05 DIAGNOSIS — J4489 Other specified chronic obstructive pulmonary disease: Secondary | ICD-10-CM | POA: Diagnosis not present

## 2023-03-05 DIAGNOSIS — G4730 Sleep apnea, unspecified: Secondary | ICD-10-CM | POA: Insufficient documentation

## 2023-03-05 DIAGNOSIS — K4090 Unilateral inguinal hernia, without obstruction or gangrene, not specified as recurrent: Secondary | ICD-10-CM | POA: Diagnosis not present

## 2023-03-05 DIAGNOSIS — M542 Cervicalgia: Secondary | ICD-10-CM | POA: Insufficient documentation

## 2023-03-05 DIAGNOSIS — K219 Gastro-esophageal reflux disease without esophagitis: Secondary | ICD-10-CM | POA: Insufficient documentation

## 2023-03-05 DIAGNOSIS — M549 Dorsalgia, unspecified: Secondary | ICD-10-CM | POA: Diagnosis not present

## 2023-03-05 DIAGNOSIS — F17210 Nicotine dependence, cigarettes, uncomplicated: Secondary | ICD-10-CM | POA: Insufficient documentation

## 2023-03-05 DIAGNOSIS — G8929 Other chronic pain: Secondary | ICD-10-CM | POA: Diagnosis not present

## 2023-03-05 DIAGNOSIS — I10 Essential (primary) hypertension: Secondary | ICD-10-CM | POA: Insufficient documentation

## 2023-03-05 HISTORY — PX: INSERTION OF MESH: SHX5868

## 2023-03-05 SURGERY — HERNIORRHAPHY, INGUINAL, ROBOT-ASSISTED, LAPAROSCOPIC
Anesthesia: General | Site: Inguinal | Laterality: Right

## 2023-03-05 MED ORDER — MIDAZOLAM HCL 2 MG/2ML IJ SOLN
INTRAMUSCULAR | Status: DC | PRN
  Administered 2023-03-05: 2 mg via INTRAVENOUS

## 2023-03-05 MED ORDER — CEFAZOLIN SODIUM-DEXTROSE 2-4 GM/100ML-% IV SOLN
INTRAVENOUS | Status: AC
  Filled 2023-03-05: qty 100

## 2023-03-05 MED ORDER — CHLORHEXIDINE GLUCONATE CLOTH 2 % EX PADS
6.0000 | MEDICATED_PAD | Freq: Once | CUTANEOUS | Status: AC
  Administered 2023-03-05: 6 via TOPICAL

## 2023-03-05 MED ORDER — DEXAMETHASONE SODIUM PHOSPHATE 10 MG/ML IJ SOLN
INTRAMUSCULAR | Status: AC
  Filled 2023-03-05: qty 1

## 2023-03-05 MED ORDER — OXYCODONE HCL 5 MG/5ML PO SOLN: 5.0000 mg | Freq: Once | ORAL | Status: AC | PRN

## 2023-03-05 MED ORDER — ORAL CARE MOUTH RINSE: 15.0000 mL | Freq: Once | OROMUCOSAL | Status: AC

## 2023-03-05 MED ORDER — PROPOFOL 10 MG/ML IV BOLUS
INTRAVENOUS | Status: AC
  Filled 2023-03-05: qty 20

## 2023-03-05 MED ORDER — LACTATED RINGERS IV SOLN: INTRAVENOUS | Status: DC

## 2023-03-05 MED ORDER — MIDAZOLAM HCL 2 MG/2ML IJ SOLN
INTRAMUSCULAR | Status: AC
  Filled 2023-03-05: qty 2

## 2023-03-05 MED ORDER — OXYCODONE HCL 5 MG PO TABS: 5.0000 mg | ORAL_TABLET | Freq: Four times a day (QID) | ORAL | 0 refills | Status: AC | PRN

## 2023-03-05 MED ORDER — FENTANYL CITRATE (PF) 100 MCG/2ML IJ SOLN
INTRAMUSCULAR | Status: AC
  Filled 2023-03-05: qty 2

## 2023-03-05 MED ORDER — ONDANSETRON HCL 4 MG/2ML IJ SOLN: 4.0000 mg | Freq: Once | INTRAMUSCULAR | Status: DC | PRN

## 2023-03-05 MED ORDER — CHLORHEXIDINE GLUCONATE 0.12 % MT SOLN
OROMUCOSAL | Status: AC
  Filled 2023-03-05: qty 15

## 2023-03-05 MED ORDER — ROCURONIUM BROMIDE 100 MG/10ML IV SOLN
INTRAVENOUS | Status: DC | PRN
  Administered 2023-03-05 (×3): 10 mg via INTRAVENOUS
  Administered 2023-03-05: 50 mg via INTRAVENOUS
  Administered 2023-03-05: 10 mg via INTRAVENOUS

## 2023-03-05 MED ORDER — SUGAMMADEX SODIUM 200 MG/2ML IV SOLN
INTRAVENOUS | Status: DC | PRN
  Administered 2023-03-05: 150 mg via INTRAVENOUS

## 2023-03-05 MED ORDER — OXYCODONE HCL 5 MG PO TABS
5.0000 mg | ORAL_TABLET | Freq: Once | ORAL | Status: AC | PRN
  Administered 2023-03-05: 5 mg via ORAL

## 2023-03-05 MED ORDER — CELECOXIB 200 MG PO CAPS
ORAL_CAPSULE | ORAL | Status: AC
  Filled 2023-03-05: qty 1

## 2023-03-05 MED ORDER — PROPOFOL 10 MG/ML IV BOLUS
INTRAVENOUS | Status: DC | PRN
  Administered 2023-03-05: 130 mg via INTRAVENOUS
  Administered 2023-03-05: 70 mg via INTRAVENOUS

## 2023-03-05 MED ORDER — BUPIVACAINE-EPINEPHRINE (PF) 0.25% -1:200000 IJ SOLN
INTRAMUSCULAR | Status: DC | PRN
  Administered 2023-03-05: 30 mL

## 2023-03-05 MED ORDER — FENTANYL CITRATE (PF) 100 MCG/2ML IJ SOLN: 25.0000 ug | INTRAMUSCULAR | Status: DC | PRN

## 2023-03-05 MED ORDER — LIDOCAINE HCL (CARDIAC) PF 100 MG/5ML IV SOSY
PREFILLED_SYRINGE | INTRAVENOUS | Status: DC | PRN
  Administered 2023-03-05: 100 mg via INTRAVENOUS

## 2023-03-05 MED ORDER — BUPIVACAINE LIPOSOME 1.3 % IJ SUSP: 20.0000 mL | Freq: Once | INTRAMUSCULAR | Status: DC

## 2023-03-05 MED ORDER — GABAPENTIN 300 MG PO CAPS
300.0000 mg | ORAL_CAPSULE | ORAL | Status: AC
  Administered 2023-03-05: 300 mg via ORAL

## 2023-03-05 MED ORDER — DEXAMETHASONE SODIUM PHOSPHATE 10 MG/ML IJ SOLN
INTRAMUSCULAR | Status: DC | PRN
  Administered 2023-03-05: 10 mg via INTRAVENOUS

## 2023-03-05 MED ORDER — BUPIVACAINE-EPINEPHRINE (PF) 0.25% -1:200000 IJ SOLN
INTRAMUSCULAR | Status: AC
  Filled 2023-03-05: qty 30

## 2023-03-05 MED ORDER — CELECOXIB 200 MG PO CAPS
200.0000 mg | ORAL_CAPSULE | ORAL | Status: AC
  Administered 2023-03-05: 200 mg via ORAL

## 2023-03-05 MED ORDER — GABAPENTIN 300 MG PO CAPS
ORAL_CAPSULE | ORAL | Status: AC
  Filled 2023-03-05: qty 1

## 2023-03-05 MED ORDER — ROCURONIUM BROMIDE 10 MG/ML (PF) SYRINGE
PREFILLED_SYRINGE | INTRAVENOUS | Status: AC
  Filled 2023-03-05: qty 10

## 2023-03-05 MED ORDER — LIDOCAINE HCL (PF) 2 % IJ SOLN
INTRAMUSCULAR | Status: AC
  Filled 2023-03-05: qty 5

## 2023-03-05 MED ORDER — CEFAZOLIN SODIUM-DEXTROSE 2-4 GM/100ML-% IV SOLN
2.0000 g | INTRAVENOUS | Status: AC
  Administered 2023-03-05: 2 g via INTRAVENOUS

## 2023-03-05 MED ORDER — ONDANSETRON HCL 4 MG/2ML IJ SOLN
INTRAMUSCULAR | Status: AC
  Filled 2023-03-05: qty 2

## 2023-03-05 MED ORDER — ONDANSETRON HCL 4 MG/2ML IJ SOLN
INTRAMUSCULAR | Status: DC | PRN
  Administered 2023-03-05: 4 mg via INTRAVENOUS

## 2023-03-05 MED ORDER — CHLORHEXIDINE GLUCONATE 0.12 % MT SOLN
15.0000 mL | Freq: Once | OROMUCOSAL | Status: AC
  Administered 2023-03-05: 15 mL via OROMUCOSAL

## 2023-03-05 MED ORDER — FENTANYL CITRATE (PF) 100 MCG/2ML IJ SOLN
INTRAMUSCULAR | Status: DC | PRN
  Administered 2023-03-05 (×2): 50 ug via INTRAVENOUS

## 2023-03-05 MED ORDER — OXYCODONE HCL 5 MG PO TABS
ORAL_TABLET | ORAL | Status: AC
  Filled 2023-03-05: qty 1

## 2023-03-05 SURGICAL SUPPLY — 52 items
ADH SKN CLS APL DERMABOND .7 (GAUZE/BANDAGES/DRESSINGS) ×2
BLADE CLIPPER SURG (BLADE) ×2 IMPLANT
COVER TIP SHEARS 8 DVNC (MISCELLANEOUS) ×2 IMPLANT
COVER WAND RF STERILE (DRAPES) ×2 IMPLANT
DERMABOND ADVANCED .7 DNX12 (GAUZE/BANDAGES/DRESSINGS) ×2 IMPLANT
DRAPE ARM DVNC X/XI (DISPOSABLE) ×6 IMPLANT
DRAPE COLUMN DVNC XI (DISPOSABLE) ×2 IMPLANT
DRAPE UTILITY 15X26 TOWEL STRL (DRAPES) ×2 IMPLANT
ELECT CAUTERY BLADE 6.4 (BLADE) IMPLANT
ELECT REM PT RETURN 9FT ADLT (ELECTROSURGICAL) ×2
ELECTRODE REM PT RTRN 9FT ADLT (ELECTROSURGICAL) ×2 IMPLANT
FORCEPS BPLR R/ABLATION 8 DVNC (INSTRUMENTS) ×2 IMPLANT
GLOVE ORTHO TXT STRL SZ7.5 (GLOVE) ×6 IMPLANT
GOWN STRL REUS W/ TWL LRG LVL3 (GOWN DISPOSABLE) ×2 IMPLANT
GOWN STRL REUS W/ TWL XL LVL3 (GOWN DISPOSABLE) ×4 IMPLANT
GOWN STRL REUS W/TWL LRG LVL3 (GOWN DISPOSABLE) ×6
GOWN STRL REUS W/TWL XL LVL3 (GOWN DISPOSABLE) ×6
GRASPER SUT TROCAR 14GX15 (MISCELLANEOUS) IMPLANT
IRRIGATION STRYKERFLOW (MISCELLANEOUS) IMPLANT
IRRIGATOR STRYKERFLOW (MISCELLANEOUS)
IV NS 1000ML (IV SOLUTION)
IV NS 1000ML BAXH (IV SOLUTION) IMPLANT
KIT PINK PAD W/HEAD ARE REST (MISCELLANEOUS) ×2
KIT PINK PAD W/HEAD ARM REST (MISCELLANEOUS) ×2 IMPLANT
LABEL OR SOLS (LABEL) ×2 IMPLANT
MANIFOLD NEPTUNE II (INSTRUMENTS) ×2 IMPLANT
MESH 3DMAX LIGHT 4.8X6.7 RT XL (Mesh General) IMPLANT
NDL DRIVE SUT CUT DVNC (INSTRUMENTS) ×2 IMPLANT
NDL HYPO 22X1.5 SAFETY MO (MISCELLANEOUS) ×2 IMPLANT
NDL INSUFFLATION 14GA 120MM (NEEDLE) IMPLANT
NEEDLE DRIVE SUT CUT DVNC (INSTRUMENTS) ×2 IMPLANT
NEEDLE HYPO 22X1.5 SAFETY MO (MISCELLANEOUS) ×2 IMPLANT
NEEDLE INSUFFLATION 14GA 120MM (NEEDLE) ×2 IMPLANT
PACK LAP CHOLECYSTECTOMY (MISCELLANEOUS) ×2 IMPLANT
PENCIL SMOKE EVACUATOR (MISCELLANEOUS) IMPLANT
SCISSORS MNPLR CVD DVNC XI (INSTRUMENTS) ×2 IMPLANT
SEAL UNIV 5-12 XI (MISCELLANEOUS) ×6 IMPLANT
SET TUBE SMOKE EVAC HIGH FLOW (TUBING) ×2 IMPLANT
SOL ELECTROSURG ANTI STICK (MISCELLANEOUS) ×2
SOLUTION ELECTROSURG ANTI STCK (MISCELLANEOUS) ×2 IMPLANT
SUT MNCRL 4-0 (SUTURE) ×2
SUT MNCRL 4-0 27XMFL (SUTURE) ×2
SUT V-LOC 90 ABS 3-0 VLT V-20 (SUTURE) IMPLANT
SUT VIC AB 2-0 SH 27 (SUTURE) ×2
SUT VIC AB 2-0 SH 27XBRD (SUTURE) ×2 IMPLANT
SUT VIC AB 3-0 SH 27 (SUTURE)
SUT VIC AB 3-0 SH 27X BRD (SUTURE) IMPLANT
SUT VICRYL 0 UR6 27IN ABS (SUTURE) IMPLANT
SUT VLOC 90 S/L VL9 GS22 (SUTURE) IMPLANT
SUTURE MNCRL 4-0 27XMF (SUTURE) ×2 IMPLANT
TRAP FLUID SMOKE EVACUATOR (MISCELLANEOUS) ×2 IMPLANT
WATER STERILE IRR 500ML POUR (IV SOLUTION) ×2 IMPLANT

## 2023-03-05 NOTE — Anesthesia Procedure Notes (Signed)
Procedure Name: Intubation Date/Time: 03/05/2023 7:42 AM  Performed by: Haruye Lainez, [place], CRNAPre-anesthesia Checklist: Patient identified, Patient being monitored, Timeout performed, Emergency Drugs available and Suction available Patient Re-evaluated:Patient Re-evaluated prior to induction Oxygen Delivery Method: Circle system utilized Preoxygenation: Pre-oxygenation with 100% oxygen Induction Type: IV induction Ventilation: Mask ventilation without difficulty Laryngoscope Size: 4 and McGraph Grade View: Grade I Tube type: Oral Tube size: 7.5 mm Number of attempts: 1 Airway Equipment and Method: Stylet Placement Confirmation: ETT inserted through vocal cords under direct vision, positive ETCO2 and breath sounds checked- equal and bilateral Secured at: 23 cm Tube secured with: Tape Dental Injury: Teeth and Oropharynx as per pre-operative assessment

## 2023-03-05 NOTE — Interval H&P Note (Signed)
History and Physical Interval Note:  03/05/2023 7:26 AM  Joshua Guerrero  has presented today for surgery, with the diagnosis of inguinal hernia.  The various methods of treatment have been discussed with the patient and family. After consideration of risks, benefits and other options for treatment, the patient has consented to  Procedure(s): XI ROBOTIC ASSISTED INGUINAL HERNIA (Right) as a surgical intervention.  The patient's history has been reviewed, patient examined, no change in status, stable for surgery.  I have reviewed the patient's chart and labs.  Questions were answered to the patient's satisfaction.   The right side is marked.  Campbell Lerner

## 2023-03-05 NOTE — Transfer of Care (Signed)
Immediate Anesthesia Transfer of Care Note  Patient: Joshua Guerrero  Procedure(s) Performed: XI ROBOTIC ASSISTED INGUINAL HERNIA (Right) INSERTION OF MESH (Right: Inguinal)  Patient Location: PACU  Anesthesia Type:General  Level of Consciousness: drowsy  Airway & Oxygen Therapy: Patient Spontanous Breathing and Patient connected to face mask oxygen  Post-op Assessment: Report given to RN and Post -op Vital signs reviewed and stable  Post vital signs: Reviewed and stable  Last Vitals:  Vitals Value Taken Time  BP 164/73 03/05/23 0930  Temp 37.1 C 03/05/23 0928  Pulse 63 03/05/23 0931  Resp 7 03/05/23 0931  SpO2 100 % 03/05/23 0931  Vitals shown include unfiled device data.  Last Pain:  Vitals:   03/05/23 0930  TempSrc:   PainSc: Asleep         Complications: No notable events documented.

## 2023-03-05 NOTE — Anesthesia Preprocedure Evaluation (Signed)
Anesthesia Evaluation  Patient identified by MRN, date of birth, ID band Patient awake    Reviewed: Allergy & Precautions, NPO status , Patient's Chart, lab work & pertinent test results  Airway Mallampati: II  TM Distance: >3 FB Neck ROM: full    Dental  (+) Edentulous Upper, Edentulous Lower   Pulmonary neg pulmonary ROS, sleep apnea , COPD,  COPD inhaler, Current Smoker and Patient abstained from smoking.   Pulmonary exam normal  + decreased breath sounds      Cardiovascular Exercise Tolerance: Good hypertension, Pt. on medications negative cardio ROS Normal cardiovascular exam Rhythm:Regular Rate:Normal     Neuro/Psych  Headaches   Depression    negative neurological ROS  negative psych ROS   GI/Hepatic negative GI ROS, Neg liver ROS, PUD,GERD  Medicated,,  Endo/Other  negative endocrine ROS    Renal/GU negative Renal ROS     Musculoskeletal   Abdominal Normal abdominal exam  (+)   Peds negative pediatric ROS (+)  Hematology negative hematology ROS (+)   Anesthesia Other Findings Patient on a Fentanyl patch  Reproductive/Obstetrics negative OB ROS                             Anesthesia Physical Anesthesia Plan  ASA: 3  Anesthesia Plan: General   Post-op Pain Management:    Induction: Intravenous  PONV Risk Score and Plan: Ondansetron, Dexamethasone, Midazolam and Treatment may vary due to age or medical condition  Airway Management Planned: Oral ETT  Additional Equipment:   Intra-op Plan:   Post-operative Plan: Extubation in OR  Informed Consent: I have reviewed the patients History and Physical, chart, labs and discussed the procedure including the risks, benefits and alternatives for the proposed anesthesia with the patient or authorized representative who has indicated his/her understanding and acceptance.     Dental Advisory Given  Plan Discussed with: CRNA and  Surgeon  Anesthesia Plan Comments:        Anesthesia Quick Evaluation

## 2023-03-05 NOTE — Anesthesia Postprocedure Evaluation (Signed)
Anesthesia Post Note  Patient: Joshua Guerrero  Procedure(s) Performed: XI ROBOTIC ASSISTED INGUINAL HERNIA (Right) INSERTION OF MESH (Right: Inguinal)  Patient location during evaluation: PACU Anesthesia Type: General Level of consciousness: awake Pain management: pain level controlled Vital Signs Assessment: post-procedure vital signs reviewed and stable Respiratory status: spontaneous breathing Cardiovascular status: stable Anesthetic complications: no   No notable events documented.   Last Vitals:  Vitals:   03/05/23 1015 03/05/23 1025  BP:  (!) 158/87  Pulse: 61 62  Resp: 18 18  Temp: 36.6 C (!) 36 C  SpO2: 92% 94%    Last Pain:  Vitals:   03/05/23 1025  TempSrc: Temporal  PainSc: 5                  VAN STAVEREN,Dailan Pfalzgraf

## 2023-03-05 NOTE — Telephone Encounter (Signed)
Opened in error

## 2023-03-05 NOTE — Op Note (Signed)
Robotic assisted Laparoscopic Transabdominal Right Inguinal Hernia Repair with Mesh       Pre-operative Diagnosis:  Right Inguinal Hernia   Post-operative Diagnosis: Same   Procedure: Robotic assisted Laparoscopic  repair of right inguinal hernia(s)   Surgeon: Campbell Lerner, M.D., FACS   Anesthesia: GETA   Findings: Right  inguinal hernia, no  evidence of left sided hernia.         Procedure Details  The patient was seen again in the Holding Room. The benefits, complications, treatment options, and expected outcomes were discussed with the patient. The risks of bleeding, infection, recurrence of symptoms, failure to resolve symptoms, recurrence of hernia, ischemic orchitis, chronic pain syndrome or neuroma, were reviewed again. The likelihood of improving the patient's symptoms with return to their baseline status is good.  The patient and/or family concurred with the proposed plan, giving informed consent.  The patient was taken to Operating Room, identified  and the procedure verified as Laparoscopic Inguinal Hernia Repair. Laterality confirmed.  A Time Out was held and the above information confirmed.   Prior to the induction of general anesthesia, antibiotic prophylaxis was administered. VTE prophylaxis was in place. General endotracheal anesthesia was then administered and tolerated well. After the induction, the abdomen was prepped with Chloraprep and draped in the sterile fashion. The patient was positioned in the supine position.   After local infiltration of quarter percent Marcaine with epinephrine, stab incision was made left upper quadrant.  On the left at Palmer's point, the Veress needle is passed with sensation of the layers to penetrate the abdominal wall and into the peritoneum.  Saline drop test is confirmed peritoneal placement.  Insufflation is initiated with carbon dioxide to pressures of 15 mmHg. An 8.5 mm port is placed to the left off of the midline, with blunt tipped  trocar.  Pneumoperitoneum maintained w/o HD changes to pressures of 15 mm Hg with CO2. No evidence of bowel injuries.  Two 8.5 mm ports placed under direct vision in each upper quadrant. The laparoscopy revealed the lone right indirect defect(s).   The robot was brought ot the table and docked in the standard fashion, no collision between arms was observed. Instruments were kept under direct view at all times. For right inguinal hernia repair,  I developed a peritoneal flap. The sac(s) were reduced and dissected free from adjacent structures. We preserved the vas and the vessels, and visualized them to their convergence and beyond in the retroperitoneum. Once dissection was completed an extra-large sided BARD 3D Light mesh was placed and secured at three points with interrupted 2-0 Vicryl to the pubic tubercle and anteriorly. There was good coverage of the direct, indirect and femoral spaces.  Second look revealed no complications or injuries. The flap was then closed with 3-0 V-lock suture.  Peritoneal closure completed without defects.  Once assuring that hemostasis was adequate, all needles/sponges removed, and the robot was undocked.  The ports were removed, after the abdomen was completely desulflated.  4-0 subcuticular Monocryl was used at all skin edges. Dermabond was placed.  Patient tolerated the procedure well. There were no complications. He was taken to the recovery room in stable condition.           Campbell Lerner, M.D., FACS 03/05/2023, 9:35 AM

## 2023-03-09 ENCOUNTER — Ambulatory Visit (INDEPENDENT_AMBULATORY_CARE_PROVIDER_SITE_OTHER): Payer: Medicare HMO | Admitting: Family Medicine

## 2023-03-09 ENCOUNTER — Encounter: Payer: Self-pay | Admitting: Family Medicine

## 2023-03-09 VITALS — BP 168/91 | HR 72 | Temp 99.1°F | Resp 14 | Ht 69.0 in | Wt 149.8 lb

## 2023-03-09 DIAGNOSIS — G8929 Other chronic pain: Secondary | ICD-10-CM

## 2023-03-09 DIAGNOSIS — F1020 Alcohol dependence, uncomplicated: Secondary | ICD-10-CM | POA: Insufficient documentation

## 2023-03-09 DIAGNOSIS — M542 Cervicalgia: Secondary | ICD-10-CM | POA: Diagnosis not present

## 2023-03-09 DIAGNOSIS — M549 Dorsalgia, unspecified: Secondary | ICD-10-CM

## 2023-03-09 MED ORDER — NALTREXONE HCL 50 MG PO TABS: 50.0000 mg | ORAL_TABLET | Freq: Every day | ORAL | 1 refills | Status: DC
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
## 2023-03-09 NOTE — Patient Instructions (Signed)
VISIT SUMMARY:  Mr. Kohan, during your visit today, we discussed your increased alcohol intake and the associated withdrawal symptoms you are experiencing. You expressed a strong desire to quit drinking safely. We also reviewed your chronic pain management and other ongoing health conditions.  YOUR PLAN:  -ALCOHOL USE DISORDER: Alcohol Use Disorder is a condition where an individual has difficulty controlling their alcohol consumption despite negative consequences. To help you reduce cravings, we are starting you on Naltrexone 50mg  daily. We will also refer you urgently to addiction medicine for further management and schedule a close follow-up within a week. Additionally, we will order liver function tests to check for any potential alcohol-related liver damage.   -GENERAL HEALTH MAINTENANCE: We briefly mentioned your other chronic conditions, including insomnia, hypertension, recurrent depression, hyperlipidemia, and chronic neck and back pain. There were no changes or concerns discussed for these conditions today, so you should continue with your current management plan.  INSTRUCTIONS:  Please start taking Naltrexone 50mg  daily as prescribed. We have made an urgent referral to addiction medicine for further management, and you should expect to hear from them soon. Additionally, we will schedule a follow-up appointment within a week to monitor your progress. Please also complete the liver function tests as ordered.

## 2023-03-09 NOTE — Telephone Encounter (Signed)
Forms given to provider to fill out.

## 2023-03-09 NOTE — Assessment & Plan Note (Signed)
Chronic Pain Patient has a history of chronic pain and is currently on oxycodone and fentanyl. No changes or concerns discussed in this visit. -Continue current pain management regimen.

## 2023-03-09 NOTE — Assessment & Plan Note (Addendum)
Alcohol Use Disorder CIWA score 3  Increasing alcohol intake over the past year with withdrawal symptoms (tremors, anxiety) upon cessation. Patient is motivated to quit. Discussed options including naltrexone for cravings and a gabapentin taper for withdrawal symptoms, but patient reports poor response to gabapentin in the past. Also discussed referral to a day program, but patient has physical limitations that may prevent daily attendance. -Start Naltrexone 50mg  daily to help reduce cravings. -Urgent referral to addiction medicine for further management. -Schedule close follow-up within a week. -Order liver function tests to assess potential alcohol-related liver damage.

## 2023-03-10 ENCOUNTER — Ambulatory Visit: Payer: Self-pay

## 2023-03-10 ENCOUNTER — Other Ambulatory Visit: Payer: Self-pay | Admitting: Family Medicine

## 2023-03-10 LAB — COMPREHENSIVE METABOLIC PANEL
ALT: 16 [IU]/L (ref 0–44)
AST: 20 [IU]/L (ref 0–40)
Albumin: 4 g/dL (ref 3.9–4.9)
Alkaline Phosphatase: 86 [IU]/L (ref 44–121)
BUN/Creatinine Ratio: 24 (ref 10–24)
BUN: 14 mg/dL (ref 8–27)
Bilirubin Total: <0.2 mg/dL (ref 0.0–1.2)
CO2: 21 mmol/L (ref 20–29)
Calcium: 9.7 mg/dL (ref 8.6–10.2)
Chloride: 101 mmol/L (ref 96–106)
Creatinine, Ser: 0.59 mg/dL — ABNORMAL LOW (ref 0.76–1.27)
Globulin, Total: 2.3 g/dL (ref 1.5–4.5)
Glucose: 101 mg/dL — ABNORMAL HIGH (ref 70–99)
Potassium: 4.6 mmol/L (ref 3.5–5.2)
Sodium: 141 mmol/L (ref 134–144)
Total Protein: 6.3 g/dL (ref 6.0–8.5)
eGFR: 108 mL/min/{1.73_m2} (ref >59)

## 2023-03-10 NOTE — Telephone Encounter (Signed)
Recommend stopping naltrexone due to SE, will remove this medication from patient's list.   I do recommend patient go to [facility] (Call [phone] for medication assistance for alcohol use symptoms  [name], MD

## 2023-03-10 NOTE — Telephone Encounter (Signed)
Chief Complaint: Medication Question  Symptoms: Headache, nervous, anxiety, nausea  Frequency: constant Pertinent Negatives: Patient denies vomiting, SOB, chest pain  Disposition: [] ED /[] Urgent Care (no appt availability in office) / [] Appointment(In office/virtual)/ []  [place] [facility]/ [] Home Care/ [] Refused Recommended Disposition /[] [place] [facility]/ [x]  Follow-up with PCP Additional Notes: Patient states he started taking the prescribed Naltrexone today and he said it is making him feel bad. Patient reports headache, alcohol craving, nervousness, anxiety, and feeling terrible. Patient states he can not take this medicine if the side effects continues. Per [employer] these are common side effects for patients with alcoholism. Patient states I can not take it. Care advice given and advised patient not to take anymore doses of the medication unitl he receives further recommendations from PCP. Advised if symptoms get worse call 911 for transport to ED. Patient verbalized understanding.   Reason for Disposition  [1] Caller has URGENT medicine question about med that PCP or specialist prescribed AND [2] triager unable to answer question  Answer Assessment - Initial Assessment Questions 1. NAME of MEDICINE: "What medicine(s) are you calling about?"     naltrexone (DEPADE) 50 MG tablet 2. QUESTION: "What is your question?" (e.g., double dose of medicine, side effect)     I think I am an adverse reaction to the medication. Should I continue taking it.  3. PRESCRIBER: "Who prescribed the medicine?" Reason: if prescribed by specialist, call should be referred to that group.     Dr. Neita Garnet  4. SYMPTOMS: "Do you have any symptoms?" If Yes, ask: "What symptoms are you having?"  "How bad are the symptoms (e.g., mild, moderate, severe)     I feel panicked, alcohol cravings,  Protocols used: Medication Question Call-A-[facility]

## 2023-03-11 DIAGNOSIS — G894 Chronic pain syndrome: Secondary | ICD-10-CM | POA: Diagnosis not present

## 2023-03-11 DIAGNOSIS — M47812 Spondylosis without myelopathy or radiculopathy, cervical region: Secondary | ICD-10-CM | POA: Diagnosis not present

## 2023-03-11 DIAGNOSIS — M47817 Spondylosis without myelopathy or radiculopathy, lumbosacral region: Secondary | ICD-10-CM | POA: Diagnosis not present

## 2023-03-11 DIAGNOSIS — M961 Postlaminectomy syndrome, not elsewhere classified: Secondary | ICD-10-CM | POA: Diagnosis not present

## 2023-03-12 NOTE — Telephone Encounter (Signed)
Patient advised verbalized understanding and phone number provided

## 2023-03-16 ENCOUNTER — Ambulatory Visit: Payer: Medicare HMO | Admitting: Family Medicine

## 2023-03-17 ENCOUNTER — Encounter: Payer: Medicare HMO | Admitting: Physician Assistant

## 2023-03-18 NOTE — Telephone Encounter (Signed)
Opened in error

## 2023-03-22 ENCOUNTER — Telehealth: Payer: Self-pay | Admitting: Family Medicine

## 2023-03-22 NOTE — Telephone Encounter (Signed)
Received fax from [facility]  need prior auth for Promethazine 25 mg.   Physiological scientist  Third party ID:  [id number] BIN:  [id number]  [id number]  GROUP:  [id number] Telephone # [phone]

## 2023-03-24 ENCOUNTER — Encounter: Payer: Medicare HMO | Admitting: Physician Assistant

## 2023-04-07 ENCOUNTER — Encounter: Payer: Self-pay | Admitting: Physician Assistant

## 2023-04-07 ENCOUNTER — Ambulatory Visit (INDEPENDENT_AMBULATORY_CARE_PROVIDER_SITE_OTHER): Payer: Medicare HMO | Admitting: Physician Assistant

## 2023-04-07 VITALS — BP 116/72 | HR 65 | Temp 98.4°F | Ht 69.0 in | Wt 145.6 lb

## 2023-04-07 DIAGNOSIS — Z09 Encounter for follow-up examination after completed treatment for conditions other than malignant neoplasm: Secondary | ICD-10-CM

## 2023-04-07 DIAGNOSIS — K4090 Unilateral inguinal hernia, without obstruction or gangrene, not specified as recurrent: Secondary | ICD-10-CM

## 2023-04-07 NOTE — Telephone Encounter (Signed)
Information regarding your request [facility] was not able to process the request because the previous Prior Authorization Request was Denied, please contact the plan at [phone] or fax in request to [phone].  I think is from the one that was filled out and faxed that was denied?

## 2023-04-07 NOTE — Patient Instructions (Signed)

## 2023-04-07 NOTE — Progress Notes (Signed)
[facility] POST-OP OFFICE VISIT  04/07/2023  HPI: Joshua Guerrero is a 67 y.o. male ~1 month s/p robotic assisted laparoscopic right inguinal hernia repair with Dr Claudine Mouton  He is doing well Still with right groin soreness; improving Utilizing his typical regimen for pain No fever, chills No scrotal swelling or bruising Ambulating at baseline No other complaints   Vital signs: BP 116/72   Pulse 65   Temp 98.4 F (36.9 C) (Oral)   Ht 5\' 9"  (1.753 m)   Wt 145 lb 9.6 oz (66 kg)   SpO2 96%   BMI 21.50 kg/m    Physical Exam: Constitutional: Well appearing male, NAD Abdomen: Soft, still with expected mild induration in right groin which is mildly tender, no recurrence, non-distended, no rebound/guarding Skin: Laparoscopic incisions are healing well, no erythema or drainage   Assessment/Plan: This is a 67 y.o. male ~1 month s/p robotic assisted laparoscopic right inguinal hernia repair with Dr Claudine Mouton   - Pain control prn; He can continue to follow with pain clinic for this. I will not feel comfortable prescribing additional narcotics at this time  - Reviewed wound care recommendation  - Reviewed lifting restrictions; 6 weeks total (complete on 12/13)  - He can follow up with [place] on as needed basis; He understands to call with questions/concerns  -- Lynden Oxford, PA-C [facility] 04/07/2023, 3:03 PM M-F: 7am - 4pm

## 2023-04-09 DIAGNOSIS — M47817 Spondylosis without myelopathy or radiculopathy, lumbosacral region: Secondary | ICD-10-CM | POA: Diagnosis not present

## 2023-04-09 DIAGNOSIS — M961 Postlaminectomy syndrome, not elsewhere classified: Secondary | ICD-10-CM | POA: Diagnosis not present

## 2023-04-09 DIAGNOSIS — M47812 Spondylosis without myelopathy or radiculopathy, cervical region: Secondary | ICD-10-CM | POA: Diagnosis not present

## 2023-04-09 DIAGNOSIS — G894 Chronic pain syndrome: Secondary | ICD-10-CM | POA: Diagnosis not present

## 2023-04-12 DIAGNOSIS — I10 Essential (primary) hypertension: Secondary | ICD-10-CM | POA: Diagnosis not present

## 2023-04-12 DIAGNOSIS — K219 Gastro-esophageal reflux disease without esophagitis: Secondary | ICD-10-CM | POA: Diagnosis not present

## 2023-04-12 DIAGNOSIS — I739 Peripheral vascular disease, unspecified: Secondary | ICD-10-CM | POA: Diagnosis not present

## 2023-04-12 DIAGNOSIS — Z823 Family history of stroke: Secondary | ICD-10-CM | POA: Diagnosis not present

## 2023-04-12 DIAGNOSIS — G4700 Insomnia, unspecified: Secondary | ICD-10-CM | POA: Diagnosis not present

## 2023-04-12 DIAGNOSIS — G4730 Sleep apnea, unspecified: Secondary | ICD-10-CM | POA: Diagnosis not present

## 2023-04-12 DIAGNOSIS — Z79891 Long term (current) use of opiate analgesic: Secondary | ICD-10-CM | POA: Diagnosis not present

## 2023-04-12 DIAGNOSIS — Z008 Encounter for other general examination: Secondary | ICD-10-CM | POA: Diagnosis not present

## 2023-04-12 DIAGNOSIS — J449 Chronic obstructive pulmonary disease, unspecified: Secondary | ICD-10-CM | POA: Diagnosis not present

## 2023-04-12 DIAGNOSIS — F419 Anxiety disorder, unspecified: Secondary | ICD-10-CM | POA: Diagnosis not present

## 2023-04-12 DIAGNOSIS — R269 Unspecified abnormalities of gait and mobility: Secondary | ICD-10-CM | POA: Diagnosis not present

## 2023-04-12 DIAGNOSIS — M1990 Unspecified osteoarthritis, unspecified site: Secondary | ICD-10-CM | POA: Diagnosis not present

## 2023-04-12 DIAGNOSIS — F32A Depression, unspecified: Secondary | ICD-10-CM | POA: Diagnosis not present

## 2023-04-12 NOTE — Telephone Encounter (Signed)
Denial for this agent was received on 03/12/23

## 2023-04-21 ENCOUNTER — Ambulatory Visit: Payer: Medicare HMO | Admitting: Family Medicine

## 2023-04-21 ENCOUNTER — Encounter: Payer: Self-pay | Admitting: Family Medicine

## 2023-04-21 VITALS — BP 141/76 | HR 66 | Resp 18 | Ht 69.0 in | Wt 148.0 lb

## 2023-04-21 DIAGNOSIS — M5412 Radiculopathy, cervical region: Secondary | ICD-10-CM

## 2023-04-21 DIAGNOSIS — M549 Dorsalgia, unspecified: Secondary | ICD-10-CM

## 2023-04-21 DIAGNOSIS — R110 Nausea: Secondary | ICD-10-CM

## 2023-04-21 DIAGNOSIS — I10 Essential (primary) hypertension: Secondary | ICD-10-CM | POA: Diagnosis not present

## 2023-04-21 DIAGNOSIS — F331 Major depressive disorder, recurrent, moderate: Secondary | ICD-10-CM | POA: Diagnosis not present

## 2023-04-21 DIAGNOSIS — F5101 Primary insomnia: Secondary | ICD-10-CM | POA: Diagnosis not present

## 2023-04-21 DIAGNOSIS — G8929 Other chronic pain: Secondary | ICD-10-CM

## 2023-04-21 DIAGNOSIS — F32A Depression, unspecified: Secondary | ICD-10-CM

## 2023-04-21 DIAGNOSIS — F330 Major depressive disorder, recurrent, mild: Secondary | ICD-10-CM

## 2023-04-21 MED ORDER — OLMESARTAN MEDOXOMIL 5 MG PO TABS: 10.0000 mg | ORAL_TABLET | Freq: Every day | ORAL | 2 refills | Status: DC

## 2023-04-21 MED ORDER — REXULTI 0.5 MG PO TABS: 0.5000 mg | ORAL_TABLET | Freq: Every day | ORAL | 1 refills | Status: DC

## 2023-04-21 MED ORDER — DESVENLAFAXINE SUCCINATE ER 100 MG PO TB24: 100.0000 mg | ORAL_TABLET | Freq: Every day | ORAL | 1 refills | Status: DC

## 2023-04-21 MED ORDER — KETOROLAC TROMETHAMINE 30 MG/ML IJ SOLN
30.0000 mg | Freq: Once | INTRAMUSCULAR | Status: AC
  Administered 2023-04-21: 30 mg via INTRAMUSCULAR

## 2023-04-21 MED ORDER — PROMETHAZINE HCL 25 MG PO TABS: 25.0000 mg | ORAL_TABLET | Freq: Three times a day (TID) | ORAL | 3 refills | Status: DC | PRN

## 2023-04-21 MED ORDER — METOPROLOL SUCCINATE ER 50 MG PO TB24: ORAL_TABLET | ORAL | 3 refills | Status: DC

## 2023-04-21 MED ORDER — TRAZODONE HCL 300 MG PO TABS: 300.0000 mg | ORAL_TABLET | Freq: Every day | ORAL | 0 refills | Status: DC

## 2023-04-21 MED ORDER — PROCHLORPERAZINE MALEATE 10 MG PO TABS: 10.0000 mg | ORAL_TABLET | Freq: Three times a day (TID) | ORAL | 1 refills | Status: DC | PRN

## 2023-04-21 NOTE — Progress Notes (Signed)
Established patient visit   Patient: Joshua Guerrero   DOB: February 02, 1956   67 y.o. Male  MRN: [id number] Visit Date: 04/21/2023  Today's healthcare provider: [name], MD   Chief Complaint  Patient presents with  . Depression   Subjective       Discussed the use of AI scribe software for clinical note transcription with the patient, who gave verbal consent to proceed.  History of Present Illness   The patient, a 67 year old male with a history of chronic back and neck pain managed by pain management, presents with increased lethargy and decreased energy levels following a recent surgery. The patient reports that the surgery was challenging and recovery has been slow, with persistent feelings of lethargy and a lack of motivation to engage in usual activities such as housework and cooking.  The patient has been taking Xanax, not prescribed but given by a friend, to aid with sleep. The patient reports taking a quarter of a 2mg  Xanax bar, which he believes may be contributing to his lethargy. The patient also reports that his current dose of Trazodone (150mg  at bedtime) is not sufficient for sleep, and requests an increase in dosage.  The patient also has a history of depression, currently managed with Pristiq 100mg  daily. He reports that if he misses a dose of Pristiq, he notices a significant difference in his mood within three days. The patient denies any thoughts of self-harm but reports feeling "down" and having no energy.  The patient also reports a history of alcohol use, which he used as a sleep aid. However, he reports that he has not been drinking recently, with a single beer remaining untouched in his refrigerator for two months.  The patient is also on a regimen of Fentanyl patches and Oxycodone for pain management, and Promethazine for nausea. He requests an increase in the quantity of Promethazine due to running out halfway through the month. The patient also  reports taking BC powder for pain, which he is concerned may be irritating his stomach.  The patient denies any improvement in his depressive symptoms with the use of Xanax. He reports that his stomach "wants to jump up and down a little bit," but denies any other new symptoms.        Flowsheet Row Office Visit from 09/18/2022 in [facility]  PHQ-9 Total Score 0       Past Medical History:  Diagnosis Date  . Arthritis    lower back  . Asthma   . Brain bleed (HCC) 10/2016   AT [facility]-TREATED MEDICALLY NO SURGERY  . Confusion caused by a drug    fentanyl and oxycodone  . COPD (chronic obstructive pulmonary disease) (HCC)   . Dyspnea   . GERD (gastroesophageal reflux disease)   . Headache    past hx, secondary to nerve damage from accident  . History of kidney stones   . Hypertension    CONTROLLED ON MEDS  . Neck pain, chronic   . 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 Pneumonia   . Sleep apnea    Not currently using a C-Pap    Medications: Outpatient Medications Prior to Visit  Medication Sig  . albuterol (VENTOLIN HFA) 108 (90 Base) MCG/ACT inhaler Inhale 2 puffs into the lungs every 4 (four) hours as needed for wheezing or shortness of breath.  . Ascorbic Acid (VITAMIN C PO) Take 2  tablets by mouth daily after breakfast.  . B Complex-C (B-COMPLEX WITH VITAMIN C) tablet Take 2 tablets by mouth daily after breakfast.  . baclofen (LIORESAL) 10 MG tablet Take 10 mg by mouth in the morning, at noon, and at bedtime. STARTING AT LUNCHTIME  . Cyanocobalamin (B-12 PO) Take 2 tablets by mouth daily after breakfast.  . fentaNYL (DURAGESIC - DOSED MCG/HR) 25 MCG/HR patch Place 1 patch onto the skin every 3 (three) days.  . ferrous sulfate 324 MG TBEC Take 324 mg by mouth daily after breakfast.  . magic mouthwash (nystatin, lidocaine, diphenhydrAMINE, alum & mag hydroxide) suspension Swish and spit 5 mLs 4 (four)  times daily. Please, mix all ingredients in equal ratios  . meloxicam (MOBIC) 15 MG tablet Take 1 tablet (15 mg total) by mouth daily as needed for pain. (Patient taking differently: Take 15 mg by mouth in the morning.)  . Multiple Vitamin (MULTIVITAMIN) tablet Take 1 tablet by mouth daily. Equate Senior  . omeprazole (PRILOSEC) 40 MG capsule TAKE 1 CAPSULE DAILY  . oxyCODONE (OXY IR/ROXICODONE) 5 MG immediate release tablet Take 1 tablet (5 mg total) by mouth every 6 (six) hours as needed for severe pain (pain score 7-10).  Marland Kitchen oxyCODONE-acetaminophen (PERCOCET) 10-325 MG tablet Take 1 tablet by mouth every 4 (four) hours. 3AM, 7AM, 11AM, 3 PM, 7PM AND 11PM  . Potassium 99 MG TABS Take 4 tablets by mouth daily after breakfast.  . sucralfate (CARAFATE) 1 g tablet TAKE 1 TABLET BY MOUTH 4 TIMES A DAY WITH MEALS AND AT BEDTIME.  . [DISCONTINUED] desvenlafaxine (PRISTIQ) 100 MG 24 hr tablet Take 1 tablet (100 mg total) by mouth daily.  . [DISCONTINUED] metoprolol succinate (TOPROL-XL) 50 MG 24 hr tablet Take with or immediately following a meal.  . [DISCONTINUED] olmesartan (BENICAR) 5 MG tablet TAKE 2 TABLETS BY MOUTH DAILY  . [DISCONTINUED] prochlorperazine (COMPAZINE) 10 MG tablet Take 1 tablet (10 mg total) by mouth every 8 (eight) hours as needed.  . [DISCONTINUED] promethazine (PHENERGAN) 25 MG tablet Take 1 tablet (25 mg total) by mouth every 8 (eight) hours as needed.  . [DISCONTINUED] traZODone (DESYREL) 150 MG tablet Take 1 tablet (150 mg total) by mouth at bedtime. (Patient taking differently: Take 300 mg by mouth at bedtime.)   No facility-administered medications prior to visit.    Review of Systems  Last CBC Lab Results  Component Value Date   WBC 9.7 01/20/2023   HGB 14.8 01/20/2023   HCT 44.6 01/20/2023   MCV 94 01/20/2023   MCH 31.1 01/20/2023   RDW 13.4 01/20/2023   PLT 263 01/20/2023   Last metabolic panel Lab Results  Component Value Date   GLUCOSE 101 (H) 03/09/2023    NA 141 03/09/2023   K 4.6 03/09/2023   CL 101 03/09/2023   CO2 21 03/09/2023   BUN 14 03/09/2023   CREATININE 0.59 (L) 03/09/2023   EGFR 108 03/09/2023   CALCIUM 9.7 03/09/2023   PHOS 2.2 (L) 05/26/2016   PROT 6.3 03/09/2023   ALBUMIN 4.0 03/09/2023   LABGLOB 2.3 03/09/2023   AGRATIO 2.8 (H) 06/18/2022   BILITOT <0.2 03/09/2023   ALKPHOS 86 03/09/2023   AST 20 03/09/2023   ALT 16 03/09/2023   ANIONGAP 12 07/25/2020   Last lipids Lab Results  Component Value Date   CHOL 175 08/25/2017   HDL 26 (L) 08/25/2017   LDLCALC 113 (H) 08/25/2017   TRIG 180 (H) 08/25/2017   Last hemoglobin A1c Lab Results  Component Value Date   HGBA1C 5.4 11/11/2015   Last thyroid functions Lab Results  Component Value Date   TSH 1.610 12/18/2021        Objective    BP (!) 141/76   Pulse 66   Resp 18   Ht 5\' 9"  (1.753 m)   Wt 148 lb (67.1 kg)   SpO2 99%   BMI 21.86 kg/m   BP Readings from Last 3 Encounters:  04/21/23 (!) 141/76  04/07/23 116/72  03/09/23 (!) 168/91   Wt Readings from Last 3 Encounters:  04/21/23 148 lb (67.1 kg)  04/07/23 145 lb 9.6 oz (66 kg)  03/09/23 149 lb 12.8 oz (67.9 kg)        Physical Exam  General: Alert, no acute distress Cardio: Normal S1 and S2, RRR, no r/m/g Pulm: CTAB, normal work of breathing Psych: alert, normal volume and rate of speech, normal attention, denies SI, normal mood and affect (pt is not anxious appearing)    No results found for any visits on 04/21/23.  Assessment & Plan     Problem List Items Addressed This Visit       Cardiovascular and Mediastinum   HTN, goal below 130/80   Relevant Medications   olmesartan (BENICAR) 5 MG tablet   metoprolol succinate (TOPROL-XL) 50 MG 24 hr tablet     Nervous and Auditory   Cervical nerve root disorder   Chronic  Managed by pain management,DR. Bodea Continue prescriptions for oxycodone-acetaminophen 10-325mg  every 4 hours & fentanyl patches daily Continue to  follow up with pain management as scheduled  Pt requested Toradol injection today in office, 30mg  injection administered       Relevant Medications   trazodone (DESYREL) 300 MG tablet   Brexpiprazole (REXULTI) 0.5 MG TABS   prochlorperazine (COMPAZINE) 10 MG tablet   desvenlafaxine (PRISTIQ) 100 MG 24 hr tablet     Other   Primary insomnia   Difficulty sleeping, currently managed with trazodone. Using non-prescribed Xanax from a friend, contributing to lethargy and other side effects. Discussed risks of benzodiazepines, including confusion, falls, and memory issues, especially in older adults.  - Increase trazodone to 300 mg at bedtime - Discontinue use of non-prescribed Xanax - Monitor sleep quality and adjust treatment as needed - I do not recommend a prescription for Xanax (or other benzodiazepines given his report of alcohol consumption history and pain regimen including oxycodone and fentanyl patches) for this patient, discussed this with him today, pt voiced understanding       Relevant Medications   trazodone (DESYREL) 300 MG tablet   Mild episode of recurrent major depressive disorder (HCC)   Relevant Medications   trazodone (DESYREL) 300 MG tablet   desvenlafaxine (PRISTIQ) 100 MG 24 hr tablet   Clinical depression - Primary   Chronic depression with recent exacerbation post-surgery. Reports lethargy, lack of energy, and difficulty performing daily activities. Current medication Pristiq 100 mg daily is effective. No suicidal ideation. Discussed potential benefits and risks of Rexulti for treatment-resistant depression, including weight gain and metabolic changes. - Increase trazodone to 300 mg at bedtime - Prescribe Rexulti 0.5mg  daily and increase to 1mg  for 1 week each, samples given today  - Referred to psychiatry for further management previously      Relevant Medications   trazodone (DESYREL) 300 MG tablet   Brexpiprazole (REXULTI) 0.5 MG TABS   desvenlafaxine  (PRISTIQ) 100 MG 24 hr tablet   Chronic nausea   Relevant Medications   promethazine (  PHENERGAN) 25 MG tablet   prochlorperazine (COMPAZINE) 10 MG tablet   Back pain, chronic   Chronic  Managed by pain management,DR. Bodea Continue prescriptions for oxycodone-acetaminophen 10-325mg  every 4 hours & fentanyl patches daily Continue to follow up with pain management as scheduled  Pt requested Toradol injection today in office, 30mg  injection administered  Advised to limit use of BC powders to prevent potential ulcers.      Relevant Medications   trazodone (DESYREL) 300 MG tablet   desvenlafaxine (PRISTIQ) 100 MG 24 hr tablet   Other Visit Diagnoses       Essential hypertension       Relevant Medications   olmesartan (BENICAR) 5 MG tablet   metoprolol succinate (TOPROL-XL) 50 MG 24 hr tablet        General Health Maintenance Reports cessation of alcohol use, which is a positive development. - Encourage continued abstinence from alcohol  Follow-up - Follow up with psychiatry for depression management - Check with insurance regarding promethazine coverage - Monitor response to increased trazodone and new prescription of Rexulti.       Return in about 3 weeks (around 05/12/2023).         [name], MD  [facility] [phone] (phone) [phone] (fax)  [facility]

## 2023-04-22 ENCOUNTER — Telehealth: Payer: Self-pay | Admitting: Family Medicine

## 2023-04-22 ENCOUNTER — Ambulatory Visit: Payer: Self-pay

## 2023-04-22 NOTE — Telephone Encounter (Signed)
Received fax from Covermymeds for prior auth. On Promethazine HCI 25 mg.   Key:  [id number]

## 2023-04-22 NOTE — Telephone Encounter (Signed)
  Chief Complaint: medication problem Symptoms: difficulty sleeping  Frequency: long time  Pertinent Negatives: NA Disposition: [] ED /[] Urgent Care (no appt availability in office) / [] Appointment(In office/virtual)/ []  [facility]/ [] Home Care/ [] Refused Recommended Disposition /[] [facility]/ [x]  Follow-up with PCP Additional Notes: pt had OV yesterday with PCP. Trazodone was increased to 300 MG but pt states he has been taking 450 MG most nights to help with sleep. They dont' sometimes work so he takes diphenhydramine 50mg  (9 tabs) too and that is only thing that helps him sleep. I advised him that taking that much of diphenhydramine is dangerous and not to do that anymore. Also that I would send message to Dr. Roxan Hockey and see if she can advise on different medication to help with sleep or change dose of trazodone. Pt states he was taking Xanax for 8 years and that helped but he got taken off because of opioids and not prescribing.   Pt states he asked the dr to increase his trazodone (DESYREL) .  Pt states he takes 450 mg /night.  Pt states this is not working. She sent in a new Rx for 300 mg, but pt cannot pick up b/c it is too early.  Pt has a Rx for 150 mg, and supposed to take 3 /night, but has been taking more to get to sleep.   Reason for Disposition  [1] Caller has URGENT medicine question about med that PCP or specialist prescribed AND [2] triager unable to answer question  Answer Assessment - Initial Assessment Questions 1. NAME of MEDICINE: "What medicine(s) are you calling about?"     Trazodone  2. QUESTION: "What is your question?" (e.g., double dose of medicine, side effect)     Have been taking 3 tabs 150MG  most nights  3. PRESCRIBER: "Who prescribed the medicine?" Reason: if prescribed by specialist, call should be referred to that group.     Dr. Roxan Hockey  4. SYMPTOMS: "Do you have any symptoms?" If Yes, ask: "What symptoms are you having?"  "How bad are  the symptoms (e.g., mild, moderate, severe)     Difficulty sleeping with 2 tabs  Protocols used: Medication Question Call-A-AH

## 2023-04-22 NOTE — Telephone Encounter (Signed)
I do not recommend patient taking 450mg  of Trazodone for sleep. I am not planning to increase the dose beyond the prescribed 300mg  at bedtime.   We discussed my recommendation against starting Xanax given his pain regimen, age and other sedating medications (I.e. nausea meds)   Previously referred patient to psychiatry for evaluation, I would recommend scheduling an appointment to discuss sleep recommendations

## 2023-04-23 NOTE — Telephone Encounter (Signed)
[facility] was not able to process the request because the previous Prior Authorization Request was Denied

## 2023-04-23 NOTE — Assessment & Plan Note (Signed)
Chronic depression with recent exacerbation post-surgery. Reports lethargy, lack of energy, and difficulty performing daily activities. Current medication Pristiq 100 mg daily is effective. No suicidal ideation. Discussed potential benefits and risks of Rexulti for treatment-resistant depression, including weight gain and metabolic changes. - Increase trazodone to 300 mg at bedtime - Prescribe Rexulti 0.5mg  daily and increase to 1mg  for 1 week each, samples given today  - Referred to psychiatry for further management previously

## 2023-04-23 NOTE — Assessment & Plan Note (Addendum)
Difficulty sleeping, currently managed with trazodone. Using non-prescribed Xanax from a friend, contributing to lethargy and other side effects. Discussed risks of benzodiazepines, including confusion, falls, and memory issues, especially in older adults.  - Increase trazodone to 300 mg at bedtime - Discontinue use of non-prescribed Xanax - Monitor sleep quality and adjust treatment as needed - I do not recommend a prescription for Xanax (or other benzodiazepines given his report of alcohol consumption history and pain regimen including oxycodone and fentanyl patches) for this patient, discussed this with him today, pt voiced understanding

## 2023-04-23 NOTE — Assessment & Plan Note (Addendum)
Chronic  Managed by pain management,DR. Bodea Continue prescriptions for oxycodone-acetaminophen 10-325mg  every 4 hours & fentanyl patches daily Continue to follow up with pain management as scheduled  Pt requested Toradol injection today in office, 30mg  injection administered  Advised to limit use of BC powders to prevent potential ulcers.

## 2023-04-23 NOTE — Assessment & Plan Note (Signed)
Chronic  Managed by pain management,DR. Bodea Continue prescriptions for oxycodone-acetaminophen 10-325mg  every 4 hours & fentanyl patches daily Continue to follow up with pain management as scheduled  Pt requested Toradol injection today in office, 30mg  injection administered

## 2023-04-26 NOTE — Telephone Encounter (Signed)
Patient verbalized understanding  

## 2023-05-10 DIAGNOSIS — M47812 Spondylosis without myelopathy or radiculopathy, cervical region: Secondary | ICD-10-CM | POA: Diagnosis not present

## 2023-05-10 DIAGNOSIS — M961 Postlaminectomy syndrome, not elsewhere classified: Secondary | ICD-10-CM | POA: Diagnosis not present

## 2023-05-10 DIAGNOSIS — M47817 Spondylosis without myelopathy or radiculopathy, lumbosacral region: Secondary | ICD-10-CM | POA: Diagnosis not present

## 2023-05-10 DIAGNOSIS — G894 Chronic pain syndrome: Secondary | ICD-10-CM | POA: Diagnosis not present

## 2023-05-12 ENCOUNTER — Ambulatory Visit (INDEPENDENT_AMBULATORY_CARE_PROVIDER_SITE_OTHER): Payer: Medicare HMO | Admitting: Family Medicine

## 2023-05-12 ENCOUNTER — Encounter: Payer: Self-pay | Admitting: Family Medicine

## 2023-05-12 VITALS — BP 112/62 | HR 62 | Ht 69.0 in | Wt 155.0 lb

## 2023-05-12 DIAGNOSIS — G8929 Other chronic pain: Secondary | ICD-10-CM | POA: Diagnosis not present

## 2023-05-12 DIAGNOSIS — M542 Cervicalgia: Secondary | ICD-10-CM

## 2023-05-12 DIAGNOSIS — E7849 Other hyperlipidemia: Secondary | ICD-10-CM | POA: Diagnosis not present

## 2023-05-12 DIAGNOSIS — L989 Disorder of the skin and subcutaneous tissue, unspecified: Secondary | ICD-10-CM

## 2023-05-12 DIAGNOSIS — F330 Major depressive disorder, recurrent, mild: Secondary | ICD-10-CM

## 2023-05-12 DIAGNOSIS — F5101 Primary insomnia: Secondary | ICD-10-CM

## 2023-05-12 DIAGNOSIS — I10 Essential (primary) hypertension: Secondary | ICD-10-CM

## 2023-05-12 DIAGNOSIS — M549 Dorsalgia, unspecified: Secondary | ICD-10-CM

## 2023-05-12 DIAGNOSIS — R110 Nausea: Secondary | ICD-10-CM | POA: Diagnosis not present

## 2023-05-12 DIAGNOSIS — F1020 Alcohol dependence, uncomplicated: Secondary | ICD-10-CM

## 2023-05-12 DIAGNOSIS — F17210 Nicotine dependence, cigarettes, uncomplicated: Secondary | ICD-10-CM | POA: Diagnosis not present

## 2023-05-12 MED ORDER — KETOROLAC TROMETHAMINE 60 MG/2ML IM SOLN: 30.0000 mg | Freq: Once | INTRAMUSCULAR | Status: DC

## 2023-05-12 MED ORDER — KETOROLAC TROMETHAMINE 60 MG/2ML IM SOLN
60.0000 mg | Freq: Once | INTRAMUSCULAR | Status: AC
  Administered 2023-05-12: 30 mg via INTRAMUSCULAR
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
## 2023-05-13 DIAGNOSIS — L989 Disorder of the skin and subcutaneous tissue, unspecified: Secondary | ICD-10-CM | POA: Insufficient documentation

## 2023-05-13 NOTE — Assessment & Plan Note (Signed)
 Dry, itchy, and sore skin on the scalp for six months. Lesion approximately two inches long, linear, and fluctuating in size. Differential diagnosis includes seborrheic dermatitis; further evaluation needed to rule out malignancy. Patient agreeable to dermatology referral. - Refer to dermatology for further evaluation and possible biopsy - Advise against excessive topical treatments until dermatology evaluation

## 2023-05-13 NOTE — Assessment & Plan Note (Signed)
 Chronic Reports decreased alcohol consumption, 2 drinks 3 times per week

## 2023-05-13 NOTE — Assessment & Plan Note (Signed)
 Hypertension Well-controlled hypertension with metoprolol 50 mg daily and olmesartan 10 mg daily. Blood pressure today is 112/62 mmHg. Chronic Well controlled today - Continue current antihypertensive regimen

## 2023-05-13 NOTE — Assessment & Plan Note (Signed)
 Chronic neck and back pain managed by pain management. Currently undergoing medication reduction to improve clarity. Concern about potential increase in pain. - Administer Toradol  injection 30 mg - Continue current pain management plan with adjustments as needed

## 2023-05-18 ENCOUNTER — Telehealth: Payer: Self-pay

## 2023-05-18 NOTE — Telephone Encounter (Signed)
 Copied from CRM [id number]. Topic: Medicare AWV >> May 18, 2023 10:04 AM Everette C wrote: Reason for CRM: The patient would like to be contacted by a member of staff when possible to reschedule their AWV  Please contact the patient further when available

## 2023-06-02 ENCOUNTER — Ambulatory Visit (INDEPENDENT_AMBULATORY_CARE_PROVIDER_SITE_OTHER): Payer: Medicare HMO | Admitting: Emergency Medicine

## 2023-06-02 VITALS — Ht 69.0 in | Wt 150.0 lb

## 2023-06-02 DIAGNOSIS — Z0000 Encounter for general adult medical examination without abnormal findings: Secondary | ICD-10-CM | POA: Diagnosis not present

## 2023-06-02 NOTE — Progress Notes (Signed)
Subjective:   Joshua Guerrero is a 68 y.o. male who presents for Medicare Annual/Subsequent preventive examination.  Interactive audio and video telecommunications were attempted between this provider and patient, however failed, due to patient having technical difficulties OR patient did not have access to video capability.  We continued and completed visit with audio only.   Visit Complete: Virtual I connected with  Joshua Guerrero on 06/02/23 by a audio enabled telemedicine application and verified that I am speaking with the correct person using two identifiers.  Patient Location: Home  Provider Location: Home Office  I discussed the limitations of evaluation and management by telemedicine. The patient expressed understanding and agreed to proceed.  Vital Signs: Because this visit was a virtual/telehealth visit, some criteria may be missing or patient reported. Any vitals not documented were not able to be obtained and vitals that have been documented are patient reported.   Cardiac Risk Factors include: advanced age (>21men, >70 women);male gender;hypertension;dyslipidemia;smoking/ tobacco exposure;Other (see comment), Risk factor comments: OSA (no cpap)     Objective:    Today's Vitals   06/02/23 1430  Weight: 150 lb (68 kg)  Height: 5\' 9"  (1.753 m)  PainSc: 8    Body mass index is 22.15 kg/m.     06/02/2023    2:47 PM 03/05/2023    6:31 AM 02/18/2023    2:15 PM 05/28/2022    1:25 PM 05/27/2021    1:11 PM 08/19/2020    2:00 PM 07/29/2020   11:53 AM  Advanced Directives  Does Patient Have a Medical Advance Directive? Yes No No No Yes Yes Yes  Type of Estate agent of [place];Living will    Healthcare Power of Attorney Living will Living will  Does patient want to make changes to medical advance directive? No - Patient declined    Yes (Inpatient - patient defers changing a medical advance directive and declines information at this time)  No - Patient  declined  Copy of Healthcare Power of Attorney in Chart? Yes - validated most recent copy scanned in chart (See row information)    Yes - validated most recent copy scanned in chart (See row information)    Would patient like information on creating a medical advance directive?  No - Patient declined  No - Patient declined       Current Medications (verified) Outpatient Encounter Medications as of 06/02/2023  Medication Sig   albuterol (VENTOLIN HFA) 108 (90 Base) MCG/ACT inhaler Inhale 2 puffs into the lungs every 4 (four) hours as needed for wheezing or shortness of breath.   Ascorbic Acid (VITAMIN C PO) Take 2 tablets by mouth daily after breakfast.   B Complex-C (B-COMPLEX WITH VITAMIN C) tablet Take 2 tablets by mouth daily after breakfast.   baclofen (LIORESAL) 10 MG tablet Take 10 mg by mouth in the morning, at noon, and at bedtime. STARTING AT LUNCHTIME   Cyanocobalamin (B-12 PO) Take 2 tablets by mouth daily after breakfast.   desvenlafaxine (PRISTIQ) 100 MG 24 hr tablet Take 1 tablet (100 mg total) by mouth daily.   fentaNYL (DURAGESIC - DOSED MCG/HR) 25 MCG/HR patch Place 1 patch onto the skin every 3 (three) days.   ferrous sulfate 324 MG TBEC Take 324 mg by mouth daily after breakfast.   magic mouthwash (nystatin, lidocaine, diphenhydrAMINE, alum & mag hydroxide) suspension Swish and spit 5 mLs 4 (four) times daily. Please, mix all ingredients in equal ratios   meloxicam (MOBIC) 15 MG  tablet Take 1 tablet (15 mg total) by mouth daily as needed for pain. (Patient taking differently: Take 15 mg by mouth in the morning.)   metoprolol succinate (TOPROL-XL) 50 MG 24 hr tablet Take with or immediately following a meal.   Multiple Vitamin (MULTIVITAMIN) tablet Take 1 tablet by mouth daily. Equate Senior   naloxone [facility]) nasal spray 4 mg/0.1 mL Place 0.4 mg into the nose once.   olmesartan (BENICAR) 5 MG tablet Take 2 tablets (10 mg total) by mouth daily.   omeprazole (PRILOSEC) 40 MG  capsule TAKE 1 CAPSULE DAILY   oxyCODONE-acetaminophen (PERCOCET) 10-325 MG tablet Take 1 tablet by mouth every 4 (four) hours. 3AM, 7AM, 11AM, 3 PM, 7PM AND 11PM   Potassium 99 MG TABS Take 4 tablets by mouth daily after breakfast.   prochlorperazine (COMPAZINE) 10 MG tablet Take 1 tablet (10 mg total) by mouth every 8 (eight) hours as needed.   promethazine (PHENERGAN) 25 MG tablet Take 1 tablet (25 mg total) by mouth every 8 (eight) hours as needed.   sucralfate (CARAFATE) 1 g tablet TAKE 1 TABLET BY MOUTH 4 TIMES A DAY WITH MEALS AND AT BEDTIME.   trazodone (DESYREL) 300 MG tablet Take 1 tablet (300 mg total) by mouth at bedtime.   Brexpiprazole (REXULTI) 0.5 MG TABS Take 1 tablet (0.5 mg total) by mouth daily. (Patient not taking: Reported on 06/02/2023)   fentaNYL (DURAGESIC) 12 MCG/HR Place 1 patch onto the skin every 3 (three) days. (Patient not taking: Reported on 06/02/2023)   oxyCODONE (OXY IR/ROXICODONE) 5 MG immediate release tablet Take 1 tablet (5 mg total) by mouth every 6 (six) hours as needed for severe pain (pain score 7-10). (Patient not taking: Reported on 06/02/2023)   No facility-administered encounter medications on file as of 06/02/2023.    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   Pneumonia    Sleep apnea    Not currently using a C-Pap   Past Surgical History:  Procedure Laterality Date    COLONOSCOPY WITH PROPOFOL N/A 02/11/2015   Procedure: COLONOSCOPY WITH PROPOFOL;  Surgeon: Midge Minium, MD;  Location: [facility];  Service: Endoscopy;  Laterality: N/A;  USES C-PAP   ESOPHAGEAL DILATION N/A 10/14/2018   Procedure: ESOPHAGEAL DILATION;  Surgeon: Midge Minium, MD;  Location: [facility];  Service: Endoscopy;  Laterality: N/A;   ESOPHAGOGASTRODUODENOSCOPY (EGD) WITH PROPOFOL N/A 10/14/2018   Procedure: ESOPHAGOGASTRODUODENOSCOPY (EGD) WITH BIOPSY;  Surgeon: Midge Minium, MD;  Location: [facility];  Service: Endoscopy;  Laterality: N/A;  sleep apnea   INSERTION OF MESH Right 03/05/2023   Procedure: INSERTION OF MESH;  Surgeon: Campbell Lerner, MD;  Location: [facility];  Service: General;  Laterality: Right;   NASAL SINUS SURGERY     due to severe sleep apnea  NECK SURGERY     diffuse disc-and put in possibly screws patient thinks   SHOULDER SURGERY Bilateral    x 2   UMBILICAL HERNIA REPAIR N/A 02/26/2017   Primary repair.  Surgeon: Earline Mayotte, MD;  Location: [facility];  Service: General;  Laterality: N/A;   VIDEO BRONCHOSCOPY WITH ENDOBRONCHIAL NAVIGATION N/A 07/29/2020   Procedure: VIDEO BRONCHOSCOPY WITH ENDOBRONCHIAL NAVIGATION;  Surgeon: Salena Saner, MD;  Location: [facility];  Service: Pulmonary;  Laterality: N/A;   VIDEO BRONCHOSCOPY WITH ENDOBRONCHIAL ULTRASOUND N/A 07/29/2020   Procedure: VIDEO BRONCHOSCOPY WITH ENDOBRONCHIAL ULTRASOUND;  Surgeon: Salena Saner, MD;  Location: [facility];  Service: Pulmonary;  Laterality: N/A;   Family History  Problem Relation Age of Onset   Dementia Father    Melanoma Mother    Cancer Paternal Uncle        unknown   Cancer Paternal Uncle        unknown   Social History   Socioeconomic History   Marital status: Divorced    Spouse name: Not on file   Number of children: 0   Years of education: 12   Highest education level: High school graduate  Occupational History   Occupation:  disability  Tobacco Use   Smoking status: Every Day    Current packs/day: 0.50    Average packs/day: 0.9 packs/day for 40.1 years (35.8 ttl pk-yrs)    Types: Cigarettes    Start date: 63    Last attempt to quit: 1998   Smokeless tobacco: Never   Tobacco comments:    currently about 1/2 PPD--02/20/2022  Vaping Use   Vaping status: Never Used  Substance and Sexual Activity   Alcohol use: No   Drug use: No   Sexual activity: Never  Other Topics Concern   Not on file  Social History Narrative   Lives alone   Social Drivers of Health   Financial Resource Strain: Low Risk  (06/02/2023)   Overall Financial Resource Strain (CARDIA)    Difficulty of Paying Living Expenses: Not hard at all  Food Insecurity: No Food Insecurity (06/02/2023)   Hunger Vital Sign    Worried About Running Out of Food in the Last Year: Never true    Ran Out of Food in the Last Year: Never true  Transportation Needs: No Transportation Needs (06/02/2023)   PRAPARE - Administrator, Civil Service (Medical): No    Lack of Transportation (Non-Medical): No  Physical Activity: Insufficiently Active (06/02/2023)   Exercise Vital Sign    Days of Exercise per Week: 4 days    Minutes of Exercise per Session: 20 min  Stress: No Stress Concern Present (06/02/2023)   [employer] of Occupational Health - Occupational Stress Questionnaire    Feeling of Stress : Only a little  Social Connections: Moderately Isolated (06/02/2023)   Social Connection and Isolation Panel [NHANES]    Frequency of Communication with Friends and Family: More than three times a week    Frequency of Social Gatherings with Friends and Family: More than three times a week    Attends Religious Services: More than 4 times per year    Active Member of [employer] or Organizations: No    Attends Banker Meetings: Never    Marital Status: Divorced    Tobacco Counseling Ready to quit: Not Answered Counseling given: Not  Answered Tobacco comments: currently about 1/2 PPD--02/20/2022   Clinical Intake:  Pre-visit preparation completed: Yes  Pain : 0-10  Pain Score: 8  Pain Type: Chronic pain Pain Location: Back Pain Descriptors / Indicators: Aching     BMI - recorded: 22.15 Nutritional Status: BMI of 19-24  Normal Nutritional Risks: Nausea/ vomitting/ diarrhea Diabetes: No  How often do you need to have someone help you when you read instructions, pamphlets, or other written materials from your doctor or pharmacy?: 1 - Never  Interpreter Needed?: No  Information entered by :: Tora Kindred, CMA   Activities of Daily Living    06/02/2023    2:32 PM 02/18/2023    2:16 PM  In your present state of health, do you have any difficulty performing the following activities:  Hearing? 1   Comment declined referral to ENT for evaluation   Vision? 0   Difficulty concentrating or making decisions? 0   Walking or climbing stairs? 1   Comment trouble with stairs   Dressing or bathing? 0   Doing errands, shopping? 0 0  Preparing Food and eating ? N   Using the Toilet? N   In the past six months, have you accidently leaked urine? N   Do you have problems with loss of bowel control? N   Managing your Medications? N   Managing your Finances? N   Housekeeping or managing your Housekeeping? N     Patient Care Team: [name], MD as PCP - General (Family Medicine) Midge Minium, MD as Consulting Physician (Gastroenterology) Verdon Cummins, MD as Referring Physician (Physical Medicine and Rehabilitation) System, Provider Not In [place], RN as Oncology Nurse Navigator  Indicate any recent Medical Services you may have received from other than [facility] providers in the past year (date may be approximate).     Assessment:   This is a routine wellness examination for Joshua Guerrero.  Hearing/Vision screen Hearing Screening - Comments:: Has hearing loss, but declined referral to ENT for  evaluation Vision Screening - Comments:: Gets eye exams, Dr Leta Jungling [place]   Goals Addressed             This Visit's Progress    Patient Stated       Gain some weight      Depression Screen    06/02/2023    2:44 PM 05/12/2023    2:39 PM 04/21/2023    3:56 PM 03/09/2023    2:25 PM 09/18/2022    1:51 PM 05/29/2022    1:34 PM 05/28/2022    1:14 PM  PHQ 2/9 Scores  PHQ - 2 Score 0 1 2  0 0 1  PHQ- 9 Score 0 3   0 0   Exception Documentation    Patient refusal       Fall Risk    06/02/2023    2:50 PM 09/18/2022    1:51 PM 07/27/2022    2:47 PM 05/28/2022    1:09 PM 05/27/2021    1:12 PM  Fall Risk   Falls in the past year? 0 1 1 0 0  Number falls in past yr: 0 0 0 0 0  Injury with Fall? 0 0 1 0 0  Risk for fall due to : No Fall Risks History of fall(s) Other (Comment) No Fall Risks No Fall Risks  Risk for fall due to: Comment   fell    Follow up Falls prevention discussed Falls evaluation completed   Falls evaluation completed    MEDICARE RISK AT HOME: Medicare Risk at Home Any stairs in or around the home?: No If so, are there  any without handrails?: No Home free of loose throw rugs in walkways, pet beds, electrical cords, etc?: Yes Adequate lighting in your home to reduce risk of falls?: Yes Life alert?: No Use of a cane, walker or w/c?: No Grab bars in the bathroom?: Yes Shower chair or bench in shower?: No Elevated toilet seat or a handicapped toilet?: Yes  TIMED UP AND GO:  Was the test performed?  No    Cognitive Function:        06/02/2023    2:51 PM 05/28/2022    1:21 PM  6CIT Screen  What Year? 0 points 0 points  What month? 0 points 0 points  What time? 0 points 0 points  Count back from 20 0 points 0 points  Months in reverse 2 points 0 points  Repeat phrase 0 points 2 points  Total Score 2 points 2 points    Immunizations Immunization History  Administered Date(s) Administered   Fluad Trivalent(High Dose 65+) 01/20/2023    Influenza,inj,Quad PF,6+ Mos 02/20/2015, 05/26/2016, 12/23/2016, 03/07/2018, 03/22/2019, 04/25/2020, 03/04/2021, 02/25/2022   PFIZER(Purple Top)SARS-COV-2 Vaccination 08/31/2019, 09/28/2019, 03/12/2020   PNEUMOCOCCAL CONJUGATE-20 02/25/2022   Pneumococcal Polysaccharide-23 05/26/2016   Td 08/17/2018   Tdap 07/31/2008    TDAP status: Up to date  Flu Vaccine status: Up to date  Pneumococcal vaccine status: Up to date  Covid-19 vaccine status: Information provided on how to obtain vaccines.   Qualifies for Shingles Vaccine? Yes   Zostavax completed No   Shingrix Completed?: No.    Education has been provided regarding the importance of this vaccine. Patient has been advised to call insurance company to determine out of pocket expense if they have not yet received this vaccine. Advised may also receive vaccine at local pharmacy or Health Dept. Verbalized acceptance and understanding.  Screening Tests Health Maintenance  Topic Date Due   HIV Screening  Never done   Hepatitis C Screening  Never done   Zoster Vaccines- Shingrix (1 of 2) Never done   Lung Cancer Screening  11/12/2021   COVID-19 Vaccine (4 - 2024-25 season) 01/03/2023   Medicare Annual Wellness (AWV)  06/01/2024   Colonoscopy  02/10/2025   DTaP/Tdap/Td (3 - Td or Tdap) 08/16/2028   Pneumonia Vaccine 20+ Years old  Completed   INFLUENZA VACCINE  Completed   HPV VACCINES  Aged Out    Health Maintenance  Health Maintenance Due  Topic Date Due   HIV Screening  Never done   Hepatitis C Screening  Never done   Zoster Vaccines- Shingrix (1 of 2) Never done   Lung Cancer Screening  11/12/2021   COVID-19 Vaccine (4 - 2024-25 season) 01/03/2023    Colorectal cancer screening: Type of screening: Colonoscopy. Completed 02/11/15. Repeat every 10 years  Lung Cancer Screening: (Low Dose CT Chest recommended if Age 52-80 years, 20 pack-year currently smoking OR have quit w/in 15years.) does qualify.   Lung Cancer Screening  Referral: 05/12/23  Additional Screening:  Hepatitis C Screening: does qualify; Completed patient declined  Vision Screening: Recommended annual ophthalmology exams for early detection of glaucoma and other disorders of the eye. Dental Screening: Recommended annual dental exams for proper oral hygiene    Community Resource Referral / Chronic Care Management: CRR required this visit?  No   CCM required this visit?  No     Plan:     I have personally reviewed and noted the following in the patient's chart:   Medical and social history Use of alcohol, tobacco  or illicit drugs  Current medications and supplements including opioid prescriptions. Patient is currently taking opioid prescriptions. Information provided to patient regarding non-opioid alternatives. Patient advised to discuss non-opioid treatment plan with their provider. Functional ability and status Nutritional status Physical activity Advanced directives List of other physicians Hospitalizations, surgeries, and ER visits in previous 12 months Vitals Screenings to include cognitive, depression, and falls Referrals and appointments  In addition, I have reviewed and discussed with patient certain preventive protocols, quality metrics, and best practice recommendations. A written personalized care plan for preventive services as well as general preventive health recommendations were provided to patient.     Tora Kindred, CMA   06/02/2023   After Visit Summary: (MyChart) Due to this being a telephonic visit, the after visit summary with patients personalized plan was offered to patient via MyChart   Nurse Notes:  6 CIT Score - 2 Reminded patient to schedule LDCT and gave phone # Patient has hearing loss, but declined referral to ENT for evaluation Declined shingles vaccine Declined Hep C screening

## 2023-06-02 NOTE — Patient Instructions (Addendum)
Mr. Hunzeker , Thank you for taking time to come for your Medicare Wellness Visit. I appreciate your ongoing commitment to your health goals. Please review the following plan we discussed and let me know if I can assist you in the future.   Referrals/Orders/Follow-Ups/Clinician Recommendations: Schedule the low dose lung CT Scan to screen for lung cancer. Call [facility] at [phone] to schedule at your earliest convenience.  This is a list of the screening recommended for you and due dates:  Health Maintenance  Topic Date Due   HIV Screening  Never done   Hepatitis C Screening  Never done   Zoster (Shingles) Vaccine (1 of 2) Never done   Screening for Lung Cancer  11/12/2021   COVID-19 Vaccine (4 - 2024-25 season) 01/03/2023   Medicare Annual Wellness Visit  06/01/2024   Colon Cancer Screening  02/10/2025   DTaP/Tdap/Td vaccine (4 - Td or Tdap) 08/16/2028   Pneumonia Vaccine  Completed   Flu Shot  Completed   HPV Vaccine  Aged Out    Advanced directives: (In Chart) A copy of your advanced directives are scanned into your chart should your provider ever need it.  Next Medicare Annual Wellness Visit scheduled for next year: Yes, 24/26 @ 1:50am (phone visit)  Managing Pain Without Opioids Opioids are strong medicines used to treat moderate to severe pain. For some people, especially those who have long-term (chronic) pain, opioids may not be the best choice for pain management due to: Side effects like nausea, constipation, and sleepiness. The risk of addiction (opioid use disorder). The longer you take opioids, the greater your risk of addiction. Pain that lasts for more than 3 months is called chronic pain. Managing chronic pain usually requires more than one approach and is often provided by a team of health care providers working together (multidisciplinary approach). Pain management may be done at a pain management center or pain clinic. How to manage pain without the use of  opioids Use non-opioid medicines Non-opioid medicines for pain may include: Over-the-counter or prescription non-steroidal anti-inflammatory drugs (NSAIDs). These may be the first medicines used for pain. They work well for muscle and bone pain, and they reduce swelling. Acetaminophen. This over-the-counter medicine may work well for milder pain but not swelling. Antidepressants. These may be used to treat chronic pain. A certain type of antidepressant (tricyclics) is often used. These medicines are given in lower doses for pain than when used for depression. Anticonvulsants. These are usually used to treat seizures but may also reduce nerve (neuropathic) pain. Muscle relaxants. These relieve pain caused by sudden muscle tightening (spasms). You may also use a pain medicine that is applied to the skin as a patch, cream, or gel (topical analgesic), such as a numbing medicine. These may cause fewer side effects than medicines taken by mouth. Do certain therapies as directed Some therapies can help with pain management. They include: Physical therapy. You will do exercises to gain strength and flexibility. A physical therapist may teach you exercises to move and stretch parts of your body that are weak, stiff, or painful. You can learn these exercises at physical therapy visits and practice them at home. Physical therapy may also involve: Massage. Heat wraps or applying heat or cold to affected areas. Electrical signals that interrupt pain signals (transcutaneous electrical nerve stimulation, TENS). Weak lasers that reduce pain and swelling (low-level laser therapy). Signals from your body that help you learn to regulate pain (biofeedback). Occupational therapy. This helps you to learn  ways to function at home and work with less pain. Recreational therapy. This involves trying new activities or hobbies, such as a physical activity or drawing. Mental health therapy, including: Cognitive behavioral  therapy (CBT). This helps you learn coping skills for dealing with pain. Acceptance and commitment therapy (ACT) to change the way you think and react to pain. Relaxation therapies, including muscle relaxation exercises and mindfulness-based stress reduction. Pain management counseling. This may be individual, family, or group counseling.  Receive medical treatments Medical treatments for pain management include: Nerve block injections. These may include a pain blocker and anti-inflammatory medicines. You may have injections: Near the spine to relieve chronic back or neck pain. Into joints to relieve back or joint pain. Into nerve areas that supply a painful area to relieve body pain. Into muscles (trigger point injections) to relieve some painful muscle conditions. A medical device placed near your spine to help block pain signals and relieve nerve pain or chronic back pain (spinal cord stimulation device). Acupuncture. Follow these instructions at home Medicines Take over-the-counter and prescription medicines only as told by your health care provider. If you are taking pain medicine, ask your health care providers about possible side effects to watch out for. Do not drive or use heavy machinery while taking prescription opioid pain medicine. Lifestyle  Do not use drugs or alcohol to reduce pain. If you drink alcohol, limit how much you have to: 0-1 drink a day for women who are not pregnant. 0-2 drinks a day for men. Know how much alcohol is in a drink. In the [place] one drink equals one 12 oz bottle of beer (355 mL), one 5 oz glass of wine (148 mL), or one 1 oz glass of hard liquor (44 mL). Do not use any products that contain nicotine or tobacco. These products include cigarettes, chewing tobacco, and vaping devices, such as e-cigarettes. If you need help quitting, ask your health care provider. Eat a healthy diet and maintain a healthy weight. Poor diet and excess weight may make pain  worse. Eat foods that are high in fiber. These include fresh fruits and vegetables, whole grains, and beans. Limit foods that are high in fat and processed sugars, such as fried and sweet foods. Exercise regularly. Exercise lowers stress and may help relieve pain. Ask your health care provider what activities and exercises are safe for you. If your health care provider approves, join an exercise class that combines movement and stress reduction. Examples include yoga and tai chi. Get enough sleep. Lack of sleep may make pain worse. Lower stress as much as possible. Practice stress reduction techniques as told by your therapist. General instructions Work with all your pain management providers to find the treatments that work best for you. You are an important member of your pain management team. There are many things you can do to reduce pain on your own. Consider joining an online or in-person support group for people who have chronic pain. Keep all follow-up visits. This is important. Where to find more information You can find more information about managing pain without opioids from: American Academy of Pain Medicine: painmed.org Institute for Chronic Pain: instituteforchronicpain.org American Chronic Pain Association: theacpa.org Contact a health care provider if: You have side effects from pain medicine. Your pain gets worse or does not get better with treatments or home therapy. You are struggling with anxiety or depression. Summary Many types of pain can be managed without opioids. Chronic pain may respond better to pain  management without opioids. Pain is best managed when you and a team of health care providers work together. Pain management without opioids may include non-opioid medicines, medical treatments, physical therapy, mental health therapy, and lifestyle changes. Tell your health care providers if your pain gets worse or is not being managed well enough. This information  is not intended to replace advice given to you by your health care provider. Make sure you discuss any questions you have with your health care provider. Document Revised: 07/31/2020 Document Reviewed: 07/31/2020 Elsevier Patient Education  2024 [employer].

## 2023-06-08 DIAGNOSIS — M961 Postlaminectomy syndrome, not elsewhere classified: Secondary | ICD-10-CM | POA: Diagnosis not present

## 2023-06-08 DIAGNOSIS — M47817 Spondylosis without myelopathy or radiculopathy, lumbosacral region: Secondary | ICD-10-CM | POA: Diagnosis not present

## 2023-06-08 DIAGNOSIS — M47812 Spondylosis without myelopathy or radiculopathy, cervical region: Secondary | ICD-10-CM | POA: Diagnosis not present

## 2023-06-08 DIAGNOSIS — G894 Chronic pain syndrome: Secondary | ICD-10-CM | POA: Diagnosis not present

## 2023-06-15 ENCOUNTER — Ambulatory Visit: Payer: Self-pay

## 2023-06-15 ENCOUNTER — Ambulatory Visit: Payer: Medicare HMO | Admitting: Family Medicine

## 2023-06-15 ENCOUNTER — Encounter: Payer: Self-pay | Admitting: Family Medicine

## 2023-06-15 VITALS — BP 112/63 | HR 68 | Temp 99.4°F | Resp 18 | Ht 69.0 in | Wt 151.7 lb

## 2023-06-15 DIAGNOSIS — F99 Mental disorder, not otherwise specified: Secondary | ICD-10-CM | POA: Diagnosis not present

## 2023-06-15 DIAGNOSIS — F5105 Insomnia due to other mental disorder: Secondary | ICD-10-CM | POA: Diagnosis not present

## 2023-06-15 DIAGNOSIS — F331 Major depressive disorder, recurrent, moderate: Secondary | ICD-10-CM | POA: Diagnosis not present

## 2023-06-15 MED ORDER — MIRTAZAPINE 7.5 MG PO TABS: 7.5000 mg | ORAL_TABLET | Freq: Every day | ORAL | 1 refills | Status: DC

## 2023-06-15 MED ORDER — DOXEPIN HCL 6 MG PO TABS: 6.0000 mg | ORAL_TABLET | Freq: Every evening | ORAL | 1 refills | Status: DC | PRN

## 2023-06-15 NOTE — Patient Instructions (Signed)
Marland Kitchen  Please review the attached list of medications and notify my office if there are any errors.   . Please bring all of your medications to every appointment so we can make sure that our medication list is the same as yours.

## 2023-06-15 NOTE — Telephone Encounter (Signed)
  Chief Complaint: Depression - 70 lb weight loss Symptoms: above Frequency: years - just realized what the issue is Pertinent Negatives: Patient denies self harm Disposition: [] ED /[] Urgent Care (no appt availability in office) / [x] Appointment(In office/virtual)/ []  [facility]/ [] Home Care/ [] Refused Recommended Disposition /[] [facility]/ []  Follow-up with PCP Additional Notes: Pt states that he just realized that he has depression and has had it for years. Mental health issues run in his family. His sister committed suicide. Pt has lost 70 lbs. Appt for this afternoon. Also gave phone number for help line and [facility].    Reason for Disposition  [1] Depression AND [2] worsening (e.g., sleeping poorly, less able to do activities of daily living)  Answer Assessment - Initial Assessment Questions 1. CONCERN: "What happened that made you call today?"     No  - mental illness runs in his family 2. DEPRESSION SYMPTOM SCREENING: "How are you feeling overall?" (e.g., decreased energy, increased sleeping or difficulty sleeping, difficulty concentrating, feelings of sadness, guilt, hopelessness, or worthlessness)     Depression 3. RISK OF HARM - SUICIDAL IDEATION:  "Do you ever have thoughts of hurting or killing yourself?"  (e.g., yes, no, no but preoccupation with thoughts about death)   - INTENT:  "Do you have thoughts of hurting or killing yourself right NOW?" (e.g., yes, no, N/A)   - PLAN: "Do you have a specific plan for how you would do this?" (e.g., gun, knife, overdose, no plan, N/A)     Not seriously 4. RISK OF HARM - HOMICIDAL IDEATION:  "Do you ever have thoughts of hurting or killing someone else?"  (e.g., yes, no, no but preoccupation with thoughts about death)   - INTENT:  "Do you have thoughts of hurting or killing someone right NOW?" (e.g., yes, no, N/A)   5. FUNCTIONAL IMPAIRMENT: "How have things been going for you overall? Have you had more difficulty than  usual doing your normal daily activities?"  (e.g., better, same, worse; self-care, school, work, interactions)     Not eating 6. SUPPORT: "Who is with you now?" "Who do you live with?" "Do you have family or friends who you can talk to?"      Roommate - not too supportive 7. THERAPIST: "Do you have a counselor or therapist? Name?"     no 8. STRESSORS: "Has there been any new stress or recent changes in your life?"     NO work - disabled 9. ALCOHOL USE OR SUBSTANCE USE (DRUG USE): "Do you drink alcohol or use any illegal drugs?"     On pain medication - Marijuana occasionally  10. OTHER: "Do you have any other physical symptoms right now?" (e.g., fever)       Lost 70 bs  Protocols used: Depression-A-AH

## 2023-06-15 NOTE — Progress Notes (Signed)
Established patient visit   Patient: Joshua Guerrero   DOB: 1956/02/12   68 y.o. Male  MRN: [id number] Visit Date: 06/15/2023  Today's healthcare provider: Mila Merry, MD   Chief Complaint  Patient presents with   Depression   Weight Loss    Patient has lost 70 lbs.   Subjective    Discussed the use of AI scribe software for clinical note transcription with the patient, who gave verbal consent to proceed.  History of Present Illness   Joshua Guerrero "Joshua Guerrero" is a 68 year old male with depression who presents with weight loss and decreased appetite.  He has been experiencing symptoms of depression for over three years, describing it as 'severely depressed' and feeling like he is just 'going through the paces.' Pristiq, which he has been taking for many years, is no longer effective. He used to take Xanax, which was effective, but had to stop due to opioid use restrictions. He was prescribed Rexulti in December but is unsure of its effectiveness and has encountered insurance coverage issues. He recalls taking Effexor a long time ago but does not remember the outcome.  He reports a significant weight loss of 70 pounds over the past four years, attributing it to a lack of appetite. He states that he 'wasn't eating that much' despite preparing large meals and believes his decreased appetite is related to his depression and the ineffectiveness of his current medication regimen.  He has difficulty sleeping and has been taking trazodone without success. He is interested in medications that could help with sleep, noting that Xanax worked well for him in the past. No current use of Xanax due to opioid restrictions.       Medications: Outpatient Medications Prior to Visit  Medication Sig   albuterol (VENTOLIN HFA) 108 (90 Base) MCG/ACT inhaler Inhale 2 puffs into the lungs every 4 (four) hours as needed for wheezing or shortness of breath.   Ascorbic Acid (VITAMIN C PO) Take 2 tablets  by mouth daily after breakfast.   B Complex-C (B-COMPLEX WITH VITAMIN C) tablet Take 2 tablets by mouth daily after breakfast.   baclofen (LIORESAL) 10 MG tablet Take 10 mg by mouth in the morning, at noon, and at bedtime. STARTING AT LUNCHTIME   desvenlafaxine (PRISTIQ) 100 MG 24 hr tablet Take 1 tablet (100 mg total) by mouth daily.   fentaNYL (DURAGESIC - DOSED MCG/HR) 25 MCG/HR patch Place 1 patch onto the skin every 3 (three) days.   fentaNYL (DURAGESIC) 12 MCG/HR Place 1 patch onto the skin every 3 (three) days.   ferrous sulfate 324 MG TBEC Take 324 mg by mouth daily after breakfast.   meloxicam (MOBIC) 15 MG tablet Take 1 tablet (15 mg total) by mouth daily as needed for pain. (Patient taking differently: Take 15 mg by mouth in the morning.)   metoprolol succinate (TOPROL-XL) 50 MG 24 hr tablet Take with or immediately following a meal.   Multiple Vitamin (MULTIVITAMIN) tablet Take 1 tablet by mouth daily. Equate Senior   olmesartan (BENICAR) 5 MG tablet Take 2 tablets (10 mg total) by mouth daily.   omeprazole (PRILOSEC) 40 MG capsule TAKE 1 CAPSULE DAILY   oxyCODONE (OXY IR/ROXICODONE) 5 MG immediate release tablet Take 1 tablet (5 mg total) by mouth every 6 (six) hours as needed for severe pain (pain score 7-10).   oxyCODONE-acetaminophen (PERCOCET) 10-325 MG tablet Take 1 tablet by mouth every 4 (four) hours. 3AM, 7AM, 11AM, 3  PM, 7PM AND 11PM   Potassium 99 MG TABS Take 4 tablets by mouth daily after breakfast.   prochlorperazine (COMPAZINE) 10 MG tablet Take 1 tablet (10 mg total) by mouth every 8 (eight) hours as needed.   promethazine (PHENERGAN) 25 MG tablet Take 1 tablet (25 mg total) by mouth every 8 (eight) hours as needed.   sucralfate (CARAFATE) 1 g tablet TAKE 1 TABLET BY MOUTH 4 TIMES A DAY WITH MEALS AND AT BEDTIME.   trazodone (DESYREL) 300 MG tablet Take 1 tablet (300 mg total) by mouth at bedtime.   Brexpiprazole (REXULTI) 0.5 MG TABS Take 1 tablet (0.5 mg total) by  mouth daily. (Patient not taking: Reported on 05/12/2023)   Cyanocobalamin (B-12 PO) Take 2 tablets by mouth daily after breakfast.   magic mouthwash (nystatin, lidocaine, diphenhydrAMINE, alum & mag hydroxide) suspension Swish and spit 5 mLs 4 (four) times daily. Please, mix all ingredients in equal ratios (Patient not taking: Reported on 06/15/2023)   naloxone (NARCAN) nasal spray 4 mg/0.1 mL Place 0.4 mg into the nose once. (Patient not taking: Reported on 06/15/2023)   No facility-administered medications prior to visit.   Review of Systems     Objective    BP 112/63 (BP Location: Left Arm, Patient Position: Sitting, Cuff Size: Normal)   Pulse 68   Temp 99.4 F (37.4 C) (Oral)   Resp 18   Ht 5\' 9"  (1.753 m)   Wt 151 lb 11.2 oz (68.8 kg)   SpO2 98%   BMI 22.40 kg/m   Physical Exam   General: Appearance:    Well developed, well nourished male in no acute distress  Eyes:    PERRL, conjunctiva/corneas clear, EOM's intact       Lungs:     Clear to auscultation bilaterally, respirations unlabored  Heart:    Normal heart rate. Normal rhythm. No murmurs, rubs, or gallops.    MS:   All extremities are intact.    Neurologic:   Awake, alert, oriented x 3. No apparent focal neurological defect.         Assessment & Plan       Depression Patient reports Pristiq is no longer effective, with symptoms of severe depression for over three years. Also reports significant weight loss and loss of appetite, which he attributes to the depression. -Add Mirtazapine to current regimen to address depression, appetite, and sleep issues. -Continue Pristiq for now, with potential to reassess after effects of Mirtazapine are evaluated.  Insomnia Patient reports difficulty sleeping, previously managed with Xanax but discontinued due to concurrent opioid use. -Add Doxepin as needed for sleep. -Consider potential to titrate off Trazodone if Mirtazapine proves effective for sleep.  Follow-up Schedule  follow-up appointment with Dr. Roxan Hockey in one month to assess effectiveness of new regimen.    Return in about 4 years (around 06/15/2027) for depression with Dr. Roxan Hockey.      Mila Merry, MD  [facility] [phone] (phone) [phone] (fax)  [facility]

## 2023-06-18 ENCOUNTER — Ambulatory Visit: Payer: Self-pay

## 2023-06-18 ENCOUNTER — Ambulatory Visit: Payer: Self-pay | Admitting: *Deleted

## 2023-06-18 NOTE — Telephone Encounter (Signed)
Reason for Disposition  [1] MILD-MODERATE pain AND [2] comes and goes (cramps)    He just saw Dr. Sherrie Mustache on Monday for this and other issues and given medication.  Answer Assessment - Initial Assessment Questions 1. LOCATION: "Where does it hurt?"    I had a bad stomach ache all night.   It's been bothering me for the last year and gotten worse.  I vomited all night.   I ate a hotdog and I've been vomiting since.   My friend ate a hotdog too and he is fine.  Every so often this happens.  I'll eat something and then I vomit.   I think it's my stomach.    Last weekend I had a really bad depression.   I saw Dr. Sherrie Mustache for it on Monday.    I can't sleep.   I take a lot of OTC medications.   I'm on oxycodone and Fentanyl for 20 yrs now.   He gave me two medicines for depression and appetite that constipate me.Dr. Sherrie Mustache told me what to take for that.      I drink a lot of coffee.   Can it irritate my stomach?    I take 2 BCs a day and drink coffee all day.   I let him know both of these can irritate your stomach.   The caffeine in the coffee could be what's keeping him from sleeping.   That's a lot of caffeine and it's a stimulant.   He didn't know this.   I cautioned him about stopping caffeine suddenly because he would probably get a bad headache.   To gradually decrease his caffeine intake.     I'm not vomiting this morning.   I drank a nutritional drink and cheese toast.   I'm keeping it down.    I'm losing weight too.   Dr. Sherrie Mustache is aware of all this when I saw him Monday.   The medicine is to help me gain weight.    The lack of sleep is "killing me".    The depression has gotten really bad last weekend.   I'm feeling a little less depression today.  They say you can buy marijuana and it will help you sleep.   I was just wondering.  Is that something I could try?    I let him know NOT to use marijuana at all for any of his issues.   "I'm not going to  go buy any but you hear all kinds of things".    "I'm not going to buy any".   "I just wondered". 2. RADIATION: "Does the pain shoot anywhere else?" (e.g., chest, back)     Just in my stomach.   It hurt all night and I vomited.   I'm not vomiting now and kept down what I ate this morning. 3. ONSET: "When did the pain begin?" (Minutes, hours or days ago)      Last night after eating a hotdog. 4. SUDDEN: "Gradual or sudden onset?"     suddenly 5. PATTERN "Does the pain come and go, or is it constant?"    - If it comes and goes: "How long does it last?" "Do you have pain now?"     (Note: Comes and goes means the pain is intermittent. It goes away completely between bouts.)    - If constant: "Is it getting better, staying the same, or getting worse?"      (Note: Constant means the pain  never goes away completely; most serious pain is constant and gets worse.)      I vomited all night 6. SEVERITY: "How bad is the pain?"  (e.g., Scale 1-10; mild, moderate, or severe)    - MILD (1-3): Doesn't interfere with normal activities, abdomen soft and not tender to touch.     - MODERATE (4-7): Interferes with normal activities or awakens from sleep, abdomen tender to touch.     - SEVERE (8-10): Excruciating pain, doubled over, unable to do any normal activities.       This morning I'm fine. 7. RECURRENT SYMPTOM: "Have you ever had this type of stomach pain before?" If Yes, ask: "When was the last time?" and "What happened that time?"      Yes for a long time, a year at least. 8. CAUSE: "What do you think is causing the stomach pain?"     I don't know 9. RELIEVING/AGGRAVATING FACTORS: "What makes it better or worse?" (e.g., antacids, bending or twisting motion, bowel movement)     I drink coffee all day every day and take 2 BC powders a day. 10. OTHER SYMPTOMS: "Do you have any other symptoms?" (e.g., back pain, diarrhea, fever, urination pain, vomiting)       See above  Protocols used: Abdominal Pain - Male-A-AH

## 2023-06-18 NOTE — Telephone Encounter (Signed)
  Chief Complaint: Had a stomach all night after eating a hotdog. Symptoms: vomited all night but has been able to keep his breakfast down and no longer feels nauseas.   "This has been happening for at least a year now".   "I let Dr. Sherrie Mustache know all about it when I saw him Monday".   He gave me some medications. Frequency: Just last night Pertinent Negatives: Patient denies vomiting this morning. Disposition: [] ED /[] Urgent Care (no appt availability in office) / [] Appointment(In office/virtual)/ []  [facility]/ [x] Home Care/ [] Refused Recommended Disposition /[] [facility]/ []  Follow-up with PCP Additional Notes: FYI sent to Dr. Sherrie Mustache.   Pt agreeable to continuing his medications and treatment plan Dr. Sherrie Mustache gave him Monday.

## 2023-06-18 NOTE — Telephone Encounter (Signed)
  Chief Complaint: Dietary questions Symptoms:  Frequency:  Pertinent Negatives: Patient denies  Disposition: [] ED /[] Urgent Care (no appt availability in office) / [] Appointment(In office/virtual)/ []  [facility]/ [] Home Care/ [] Refused Recommended Disposition /[] [facility]/ [x]  Follow-up with PCP Additional Notes: Pt is asking if Buttermilk and pickles are bad for your stomach. Are they good things for him to eat. Please advise.    Summary: medicla ?   Pt called in to ask, if butter or milk is bad for your stomach. He had pain last night but not today.     Reason for Disposition  [1] Follow-up call from patient regarding patient's clinical status AND [2] information NON-URGENT  Answer Assessment - Initial Assessment Questions 1. REASON FOR CALL or QUESTION: "What is your reason for calling today?" or "How can I best help you?" or "What question do you have that I can help answer?"     Pt is asking if Buttermilk or pickles are good or bad for you to eat. 2. CALLER: Document the source of call. (e.g., laboratory, patient).     Pt.  Protocols used: PCP Call - No Triage-A-AH

## 2023-06-18 NOTE — Telephone Encounter (Signed)
I would advise against combining pickles and buttermilk for a meal/snack

## 2023-06-21 ENCOUNTER — Encounter: Payer: Self-pay | Admitting: Pulmonary Disease

## 2023-06-28 ENCOUNTER — Other Ambulatory Visit: Payer: Self-pay | Admitting: Family Medicine

## 2023-06-28 DIAGNOSIS — R110 Nausea: Secondary | ICD-10-CM

## 2023-07-07 DIAGNOSIS — M47817 Spondylosis without myelopathy or radiculopathy, lumbosacral region: Secondary | ICD-10-CM | POA: Diagnosis not present

## 2023-07-07 DIAGNOSIS — M961 Postlaminectomy syndrome, not elsewhere classified: Secondary | ICD-10-CM | POA: Diagnosis not present

## 2023-07-07 DIAGNOSIS — G894 Chronic pain syndrome: Secondary | ICD-10-CM | POA: Diagnosis not present

## 2023-07-07 DIAGNOSIS — M47812 Spondylosis without myelopathy or radiculopathy, cervical region: Secondary | ICD-10-CM | POA: Diagnosis not present

## 2023-07-14 ENCOUNTER — Encounter: Payer: Self-pay | Admitting: Family Medicine

## 2023-07-14 ENCOUNTER — Ambulatory Visit: Payer: Medicare HMO | Admitting: Family Medicine

## 2023-07-14 VITALS — BP 85/58 | HR 69 | Ht 69.0 in | Wt 150.0 lb

## 2023-07-14 DIAGNOSIS — F330 Major depressive disorder, recurrent, mild: Secondary | ICD-10-CM | POA: Diagnosis not present

## 2023-07-14 DIAGNOSIS — I10 Essential (primary) hypertension: Secondary | ICD-10-CM | POA: Diagnosis not present

## 2023-07-14 DIAGNOSIS — F339 Major depressive disorder, recurrent, unspecified: Secondary | ICD-10-CM | POA: Diagnosis not present

## 2023-07-14 DIAGNOSIS — R600 Localized edema: Secondary | ICD-10-CM | POA: Diagnosis not present

## 2023-07-14 DIAGNOSIS — J439 Emphysema, unspecified: Secondary | ICD-10-CM | POA: Diagnosis not present

## 2023-07-14 DIAGNOSIS — F419 Anxiety disorder, unspecified: Secondary | ICD-10-CM | POA: Diagnosis not present

## 2023-07-14 DIAGNOSIS — F5101 Primary insomnia: Secondary | ICD-10-CM | POA: Diagnosis not present

## 2023-07-14 MED ORDER — TRELEGY ELLIPTA 100-62.5-25 MCG/ACT IN AEPB: 1.0000 | INHALATION_SPRAY | Freq: Every day | RESPIRATORY_TRACT | Status: AC

## 2023-07-14 MED ORDER — ARIPIPRAZOLE 2 MG PO TABS: 2.0000 mg | ORAL_TABLET | Freq: Every day | ORAL | 3 refills | Status: DC

## 2023-07-14 NOTE — Progress Notes (Signed)
 Established patient visit   Patient: Joshua Guerrero   DOB: Nov 15, 1955   68 y.o. Male  MRN: [id number] Visit Date: 07/14/2023  Today's healthcare provider: [name], MD   Chief Complaint  Patient presents with   Depression    Discuss changing meds its not helping    Joint Swelling    Ankle swelling, (both) this has been going on for about 5 months he forgot to mention it during his last visit its not painful its just uncomfortable    Subjective     HPI     Depression    Additional comments: Discuss changing meds its not helping         Joint Swelling    Additional comments: Ankle swelling, (both) this has been going on for about 5 months he forgot to mention it during his last visit its not painful its just uncomfortable       Last edited by Thedora Hinders, CMA on 07/14/2023  1:30 PM.       Discussed the use of AI scribe software for clinical note transcription with the patient, who gave verbal consent to proceed.  History of Present Illness   The patient is a 68 year old with depression who presents for management of his condition.  He experiences ongoing depression despite being on multiple medications. He is currently taking Pristiq 100 mg daily, which he has been on for most of his life. Recently, he started mirtazapine and doxepin, which have provided some relief, but he still feels 'very depressed' and struggles with daily activities. He describes his mood as 'not horrible like it was' but still wishes to 'get it off my head'. No suicidal thoughts, but he mentions feeling 'hurt' emotionally. He has a history of resistant depression and previously tried Rexulti 0.5 mg, which was not continued due to insurance issues. 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  He is on multiple medications for pain  management, including oxycodone 10 mg with acetaminophen 325 mg, Compazine, promethazine for chronic nausea related to narcotics, and fentanyl patches every 12 hours as needed. He reports using the 12-hour fentanyl patch.  He experiences low blood pressure, with a reading of 85/58 noted. He monitors his blood pressure at home.  He feels wheezy but denies difficulty breathing. He smokes cigarettes, which may contribute to his respiratory symptoms.  He is currently using trazodone 300 mg, which may be contributing to swelling in his legs or ankles. He has reduced his use of over-the-counter sleep aids since starting doxepin, which helps him sleep. Mirtazapine has also helped improve his appetite.        Flowsheet Row Office Visit from 07/14/2023 in [facility]  PHQ-9 Total Score 8        Past Medical History:  Diagnosis Date   Arthritis    lower back   Asthma    Brain bleed (HCC) 10/2016   AT [facility]-TREATED MEDICALLY NO SURGERY   Confusion caused by a drug    fentanyl and oxycodone   COPD (chronic obstructive pulmonary disease) (HCC)    Dyspnea    GERD (gastroesophageal reflux disease)    Headache    past hx, secondary to nerve damage from accident   History of kidney stones    Hypertension    CONTROLLED ON MEDS  Neck pain, chronic    Neuromuscular disorder (HCC)    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   Ascorbic Acid (VITAMIN C PO) Take 2 tablets by mouth daily after breakfast.   B Complex-C (B-COMPLEX WITH VITAMIN C) tablet Take 2 tablets by mouth daily after breakfast.   baclofen (LIORESAL) 10 MG tablet Take 10 mg by mouth in the morning, at noon, and at  bedtime. STARTING AT LUNCHTIME   Cyanocobalamin (B-12 PO) Take 2 tablets by mouth daily after breakfast.   desvenlafaxine (PRISTIQ) 100 MG 24 hr tablet Take 1 tablet (100 mg total) by mouth daily.   Doxepin HCl 6 MG TABS Take 1 tablet (6 mg total) by mouth at bedtime as needed.   fentaNYL (DURAGESIC - DOSED MCG/HR) 25 MCG/HR patch Place 1 patch onto the skin every 3 (three) days.   fentaNYL (DURAGESIC) 12 MCG/HR Place 1 patch onto the skin every 3 (three) days.   ferrous sulfate 324 MG TBEC Take 324 mg by mouth daily after breakfast.   magic mouthwash (nystatin, lidocaine, diphenhydrAMINE, alum & mag hydroxide) suspension Swish and spit 5 mLs 4 (four) times daily. Please, mix all ingredients in equal ratios   meloxicam (MOBIC) 15 MG tablet Take 1 tablet (15 mg total) by mouth daily as needed for pain. (Patient taking differently: Take 15 mg by mouth in the morning.)   metoprolol succinate (TOPROL-XL) 50 MG 24 hr tablet Take with or immediately following a meal.   mirtazapine (REMERON) 7.5 MG tablet Take 1 tablet (7.5 mg total) by mouth at bedtime.   Multiple Vitamin (MULTIVITAMIN) tablet Take 1 tablet by mouth daily. Equate Senior   naloxone [facility]) nasal spray 4 mg/0.1 mL Place 0.4 mg into the nose once.   olmesartan (BENICAR) 5 MG tablet Take 2 tablets (10 mg total) by mouth daily.   omeprazole (PRILOSEC) 40 MG capsule TAKE 1 CAPSULE DAILY   oxyCODONE-acetaminophen (PERCOCET) 10-325 MG tablet Take 1 tablet by mouth every 4 (four) hours. 3AM, 7AM, 11AM, 3 PM, 7PM AND 11PM   Potassium 99 MG TABS Take 4 tablets by mouth daily after breakfast.   prochlorperazine (COMPAZINE) 10 MG tablet TAKE 1 TABLET BY MOUTH EVERY 8 HOURS AS NEEDED   promethazine (PHENERGAN) 25 MG tablet Take 1 tablet (25 mg total) by mouth every 8 (eight) hours as needed.   sucralfate (CARAFATE) 1 g tablet TAKE 1 TABLET BY MOUTH 4 TIMES A DAY WITH MEALS AND AT BEDTIME.   trazodone (DESYREL) 300 MG tablet Take 1 tablet (300 mg  total) by mouth at bedtime.   oxyCODONE (OXY IR/ROXICODONE) 5 MG immediate release tablet Take 1 tablet (5 mg total) by mouth every 6 (six) hours as needed for severe pain (pain score 7-10). (Patient not taking: Reported on 07/14/2023)   [DISCONTINUED] Brexpiprazole (REXULTI) 0.5 MG TABS Take 1 tablet (0.5 mg total) by mouth daily. (Patient not taking: Reported on 07/14/2023)   No facility-administered medications prior to visit.    Review of Systems  Last CBC Lab Results  Component Value Date   WBC 9.7 01/20/2023   HGB 14.8 01/20/2023   HCT 44.6 01/20/2023   MCV 94  01/20/2023   MCH 31.1 01/20/2023   RDW 13.4 01/20/2023   PLT 263 01/20/2023   Last metabolic panel Lab Results  Component Value Date   GLUCOSE 101 (H) 03/09/2023   NA 141 03/09/2023   K 4.6 03/09/2023   CL 101 03/09/2023   CO2 21 03/09/2023   BUN 14 03/09/2023   CREATININE 0.59 (L) 03/09/2023   EGFR 108 03/09/2023   CALCIUM 9.7 03/09/2023   PHOS 2.2 (L) 05/26/2016   PROT 6.3 03/09/2023   ALBUMIN 4.0 03/09/2023   LABGLOB 2.3 03/09/2023   AGRATIO 2.8 (H) 06/18/2022   BILITOT <0.2 03/09/2023   ALKPHOS 86 03/09/2023   AST 20 03/09/2023   ALT 16 03/09/2023   ANIONGAP 12 07/25/2020   Last lipids Lab Results  Component Value Date   CHOL 175 08/25/2017   HDL 26 (L) 08/25/2017   LDLCALC 113 (H) 08/25/2017   TRIG 180 (H) 08/25/2017   Last hemoglobin A1c Lab Results  Component Value Date   HGBA1C 5.4 11/11/2015   Last thyroid functions Lab Results  Component Value Date   TSH 1.610 12/18/2021   Last vitamin D No results found for: "25OHVITD2", "25OHVITD3", "VD25OH" Last vitamin B12 and Folate Lab Results  Component Value Date   VITAMINB12 1,672 (H) 09/18/2022   FOLATE 4.3 09/18/2022        Objective    BP (!) 85/58   Pulse 69   Ht 5\' 9"  (1.753 m)   Wt 150 lb (68 kg)   SpO2 97%   BMI 22.15 kg/m  BP Readings from Last 3 Encounters:  07/14/23 (!) 85/58  06/15/23 112/63  05/12/23 112/62    Wt Readings from Last 3 Encounters:  07/14/23 150 lb (68 kg)  06/15/23 151 lb 11.2 oz (68.8 kg)  06/02/23 150 lb (68 kg)        Physical Exam  Physical Exam   VITALS: BP- 85/58 CHEST: Lungs wheezing with restricted sounds.  CARD: RRR PSYCH: normal eye contact and mood       No results found for any visits on 07/14/23.  Assessment & Plan     Problem List Items Addressed This Visit       Cardiovascular and Mediastinum   HTN, goal below 130/80     Other   Primary insomnia   Clinical depression   Bilateral leg edema   Anxiety   Other Visit Diagnoses       Depression, recurrent (HCC)    -  Primary   Relevant Medications   ARIPiprazole (ABILIFY) 2 MG tablet   Other Relevant Orders   TSH+T4F+T3Free     Pulmonary emphysema, unspecified emphysema type (HCC)       Relevant Medications   Fluticasone-Umeclidin-Vilant (TRELEGY ELLIPTA) 100-62.5-25 MCG/ACT AEPB          Depression Chronic depression with partial response to doxepin and mirtazapine. Significant depressive symptoms persist. Previous trial of Rexulti was not covered by insurance. Pristiq 100 mg daily is at maximum dose. Abilify considered as an adjunctive treatment for treatment-resistant depression. Not currently engaged with psychiatry due to logistical challenges. Discussed potential benefits of Abilify and the importance of psychiatric follow-up for comprehensive management. - Prescribe Abilify 2 mg once daily - Encourage follow-up with psychiatry for further management of treatment-resistant depression  Hypotension Blood pressure at 85/58 mmHg, significantly low. Potential contributors include olmesartan and pain medications. Discussed adjusting antihypertensive therapy based on home blood pressure monitoring to prevent further hypotension. - Adjust olmesartan dose to 5 mg -  Instruct to take olmesartan only if home blood pressure readings are above 140/90 mmHg  Chronic Pain Chronic pain managed  with oxycodone-acetaminophen and fentanyl patches. Pain management may contribute to hypotension. Discussed the need to monitor blood pressure closely due to potential hypotensive effects of pain medications. - Continue current pain management regimen - Monitor blood pressure closely due to potential hypotensive effects of pain medications  Wheezing Wheezing and restricted lung sounds present. Smoking history noted. Consideration of inhaler therapy to address respiratory symptoms. Discussed the use of Trelegy inhaler to improve respiratory function. - Provide Trelegy inhaler sample (100/62.5/25 mcg) for once daily use  Thyroid Function Evaluation Potential thyroid dysfunction contributing to mood symptoms. Agreed to check thyroid function to rule out any underlying issues affecting mood. - Order TSH, T4, T3 tests to evaluate thyroid function  Follow-up Follow-up appointment to reassess management plan and response to new interventions. - Schedule follow-up appointment in 3-4 weeks         Return in about 3 weeks (around 08/04/2023) for depression .         [name], MD  [facility] [phone] (phone) [phone] (fax)  [facility]

## 2023-07-14 NOTE — Patient Instructions (Signed)
 VISIT SUMMARY:  Today, we discussed your ongoing depression and the challenges you face despite being on multiple medications. We also addressed your low blood pressure, chronic pain, wheezing, and the need to evaluate your thyroid function. A new medication was prescribed to help with your depression, and adjustments were made to your current treatment plan to better manage your symptoms.  YOUR PLAN:  -DEPRESSION: Depression is a mental health condition characterized by persistent feelings of sadness and loss of interest. We have prescribed Abilify 2 mg once daily to help manage your treatment-resistant depression. It is important to follow up with psychiatry for comprehensive management.  -HYPOTENSION: Hypotension is low blood pressure, which can cause dizziness and fainting. Your blood pressure was noted to be low at 85/58 mmHg. We have adjusted your olmesartan dose to 5 mg and instructed you to take it only if your home blood pressure readings are above 140/90 mmHg.  -CHRONIC PAIN: Chronic pain is long-lasting pain that can be managed with medications. You are currently on a regimen that includes oxycodone-acetaminophen and fentanyl patches. Please continue this regimen and monitor your blood pressure closely due to the potential hypotensive effects of these medications.  -WHEEZING: Wheezing is a high-pitched whistling sound made while breathing, often due to respiratory issues. We have provided you with a sample of the Trelegy inhaler (100/62.5/25 mcg) to use once daily to help improve your respiratory function.  -THYROID FUNCTION EVALUATION: Thyroid function can affect your mood and overall health. We have ordered tests to check your thyroid function, including TSH, T4, and T3 levels, to rule out any underlying issues.  INSTRUCTIONS:  Please schedule a follow-up appointment in 3-4 weeks to reassess your management plan and response to the new interventions.

## 2023-07-15 ENCOUNTER — Encounter: Payer: Self-pay | Admitting: Family Medicine

## 2023-07-15 LAB — TSH+T4F+T3FREE
Free T4: 0.63 ng/dL — ABNORMAL LOW (ref 0.82–1.77)
T3, Free: 2.2 pg/mL (ref 2.0–4.4)
TSH: 1.76 u[IU]/mL (ref 0.450–4.500)

## 2023-07-22 ENCOUNTER — Ambulatory Visit: Admitting: Surgery

## 2023-07-26 ENCOUNTER — Other Ambulatory Visit: Payer: Self-pay | Admitting: Family Medicine

## 2023-07-26 DIAGNOSIS — F5101 Primary insomnia: Secondary | ICD-10-CM

## 2023-07-30 ENCOUNTER — Other Ambulatory Visit: Payer: Self-pay | Admitting: Family Medicine

## 2023-08-04 DIAGNOSIS — M47812 Spondylosis without myelopathy or radiculopathy, cervical region: Secondary | ICD-10-CM | POA: Diagnosis not present

## 2023-08-04 DIAGNOSIS — M47817 Spondylosis without myelopathy or radiculopathy, lumbosacral region: Secondary | ICD-10-CM | POA: Diagnosis not present

## 2023-08-04 DIAGNOSIS — G894 Chronic pain syndrome: Secondary | ICD-10-CM | POA: Diagnosis not present

## 2023-08-04 DIAGNOSIS — M961 Postlaminectomy syndrome, not elsewhere classified: Secondary | ICD-10-CM | POA: Diagnosis not present

## 2023-08-18 ENCOUNTER — Ambulatory Visit: Admitting: Family Medicine

## 2023-08-25 ENCOUNTER — Other Ambulatory Visit: Payer: Self-pay | Admitting: Family Medicine

## 2023-08-25 DIAGNOSIS — R110 Nausea: Secondary | ICD-10-CM

## 2023-08-25 MED ORDER — PROCHLORPERAZINE MALEATE 10 MG PO TABS: 10.0000 mg | ORAL_TABLET | Freq: Three times a day (TID) | ORAL | 1 refills | Status: DC | PRN

## 2023-08-25 NOTE — Telephone Encounter (Signed)
 [facility] faxed refill request for the following medications:   prochlorperazine  (COMPAZINE ) 10 MG tablet    Please advise.

## 2023-08-26 NOTE — Telephone Encounter (Signed)
 Requested medication (s) are due for refill today: yes  Requested medication (s) are on the active medication list: yes  Last refill:  04/21/23  Future visit scheduled: yes  Notes to clinic:  Unable to refill per protocol, cannot delegate.      Requested Prescriptions  Pending Prescriptions Disp Refills   promethazine  (PHENERGAN ) 25 MG tablet [Pharmacy Med Name: PROMETHAZINE  HCL 25 MG TAB] 60 tablet 3    Sig: TAKE 1 TABLET BY MOUTH EVERY 8 HOURS AS NEEDED     Not Delegated - Gastroenterology: Antiemetics Failed - 08/26/2023  9:48 AM      Failed - This refill cannot be delegated      Passed - Valid encounter within last 6 months    Recent Outpatient Visits           1 month ago Depression, recurrent (HCC)   [facility] Simmons-Robinson, [place], MD   2 months ago Moderate episode of recurrent major depressive disorder (HCC)   [facility] Fisher, Erlinda Haws, MD       Future Appointments             In 3 weeks Simmons-Robinson, Judyann Number, MD [facility]

## 2023-08-31 ENCOUNTER — Other Ambulatory Visit: Payer: Self-pay | Admitting: Family Medicine

## 2023-09-01 DIAGNOSIS — G894 Chronic pain syndrome: Secondary | ICD-10-CM | POA: Diagnosis not present

## 2023-09-01 DIAGNOSIS — M961 Postlaminectomy syndrome, not elsewhere classified: Secondary | ICD-10-CM | POA: Diagnosis not present

## 2023-09-01 DIAGNOSIS — M47812 Spondylosis without myelopathy or radiculopathy, cervical region: Secondary | ICD-10-CM | POA: Diagnosis not present

## 2023-09-01 DIAGNOSIS — M47817 Spondylosis without myelopathy or radiculopathy, lumbosacral region: Secondary | ICD-10-CM | POA: Diagnosis not present

## 2023-09-20 ENCOUNTER — Ambulatory Visit (INDEPENDENT_AMBULATORY_CARE_PROVIDER_SITE_OTHER): Admitting: Family Medicine

## 2023-09-20 ENCOUNTER — Encounter: Payer: Self-pay | Admitting: Family Medicine

## 2023-09-20 VITALS — BP 116/68 | HR 76 | Ht 69.0 in | Wt 153.0 lb

## 2023-09-20 DIAGNOSIS — K219 Gastro-esophageal reflux disease without esophagitis: Secondary | ICD-10-CM | POA: Diagnosis not present

## 2023-09-20 DIAGNOSIS — F419 Anxiety disorder, unspecified: Secondary | ICD-10-CM | POA: Diagnosis not present

## 2023-09-20 DIAGNOSIS — F330 Major depressive disorder, recurrent, mild: Secondary | ICD-10-CM

## 2023-09-20 DIAGNOSIS — M542 Cervicalgia: Secondary | ICD-10-CM | POA: Diagnosis not present

## 2023-09-20 DIAGNOSIS — G8929 Other chronic pain: Secondary | ICD-10-CM

## 2023-09-20 DIAGNOSIS — E611 Iron deficiency: Secondary | ICD-10-CM | POA: Diagnosis not present

## 2023-09-20 DIAGNOSIS — M549 Dorsalgia, unspecified: Secondary | ICD-10-CM

## 2023-09-20 MED ORDER — MIRTAZAPINE 15 MG PO TBDP: 15.0000 mg | ORAL_TABLET | Freq: Every day | ORAL | 1 refills | Status: DC

## 2023-09-20 NOTE — Assessment & Plan Note (Signed)
 Assessment & Plan Depression, Chronic  Improved  Depression is clinically resistant. Currently managed with Abilify  2 mg daily and mirtazapine  7.5 mg at bedtime. Reports improvement in mood but still experiences tough periods of depression. Appetite has increased, and weight is up by 3 pounds since the last visit. PHQ-9 scores will be reviewed to assess progress. Has not established care with a therapist or psychiatrist due to scheduling conflicts but acknowledges the need for therapy. Has quit drinking for a couple of months, which may positively impact mood. - Increase mirtazapine  to 15 mg at bedtime. - Continue Abilify  2 mg daily. - Encourage establishing care with a therapist or psychiatrist when schedule allows.

## 2023-09-20 NOTE — Assessment & Plan Note (Signed)
 Chronic pain management Reports difficulty managing pain due to reduction in fentanyl  dosage. Pain management follow-up is scheduled for Oct 01, 2023. 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 Current pain management includes fentanyl , oxycodone , acetaminophen , and meloxicam . Discussed the possibility of increasing fentanyl  dosage for better pain control. - Continue current pain management regimen. - Follow up with pain management on Oct 01, 2023.

## 2023-09-20 NOTE — Progress Notes (Signed)
 Established patient visit   Patient: Joshua Guerrero   DOB: 1955/12/17   68 y.o. Male  MRN: [id number] Visit Date: 09/20/2023  Today's healthcare provider: Mimi Alt, MD   Chief Complaint  Patient presents with   Depression   Medication     Discuss medication "the medication that helps him eat" possibly increase    Subjective     HPI     Medication     Additional comments: Discuss medication "the medication that helps him eat" possibly increase       Last edited by Bart Lieu, CMA on 09/20/2023  1:51 PM.       Discussed the use of AI scribe software for clinical note transcription with the patient, who gave verbal consent to proceed.  History of Present Illness WEBB WEED "[name]" is a 68 year old male with depression and hypertension who presents for follow-up on mood and blood pressure management.  He is currently taking Abilify  2 mg daily, doxepin  6 mg at bedtime as needed, mirtazapine  7.5 mg at bedtime, and trazodone  300 mg at bedtime. He describes his depression as 'pretty tough' at times but notes improvement, stating he is not dwelling on it as much and feels better than before. Regular church attendance provides him with peace. He is interested in increasing his mirtazapine  dosage to 15 mg at bedtime.  His appetite has increased, resulting in a weight gain of three pounds since the last visit, with a current weight of 153 pounds and a BMI of 22. He questions whether his depression could be affecting his appetite, acknowledging its impact on eating habits.  He has quit drinking alcohol for a couple of months, which he believes is beneficial. He has not yet established care with a therapist or psychiatrist due to scheduling conflicts, although he acknowledges the need for someone to talk to.  Regarding chronic pain management, his fentanyl  dosage was reduced, impacting his ability to perform tasks on his property. 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 He is scheduled to follow up with pain management on Oct 01, 2023.   Patient reports that his other issues are chronic and stable. He reports taking his medications regularly.   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   ARIPiprazole  (ABILIFY ) 2 MG tablet Take 1 tablet (2 mg total) by mouth daily.   Ascorbic Acid (VITAMIN C PO) Take 2 tablets by mouth  daily after breakfast.   B Complex-C (B-COMPLEX WITH VITAMIN C) tablet Take 2 tablets by mouth daily after breakfast.   baclofen  (LIORESAL ) 10 MG tablet Take 10 mg by mouth in the morning, at noon, and at bedtime. STARTING AT LUNCHTIME   Cyanocobalamin  (B-12 PO) Take 2 tablets by mouth daily after breakfast.   desvenlafaxine  (PRISTIQ ) 100 MG 24 hr tablet Take 1 tablet (100 mg total) by mouth daily.   Doxepin  HCl 6 MG TABS TAKE 1 TABLET BY MOUTH AT BEDTIME AS NEEDED   fentaNYL  (DURAGESIC  - DOSED MCG/HR) 25 MCG/HR patch Place 1 patch onto the skin every 3 (three) days.   fentaNYL  (DURAGESIC ) 12 MCG/HR Place 1 patch onto the skin every 3 (three) days.   ferrous sulfate 324  MG TBEC Take 324 mg by mouth daily after breakfast.   Fluticasone-Umeclidin-Vilant (TRELEGY ELLIPTA ) 100-62.5-25 MCG/ACT AEPB Inhale 1 puff into the lungs daily.   magic mouthwash (nystatin , lidocaine , diphenhydrAMINE, alum & mag hydroxide) suspension Swish and spit 5 mLs 4 (four) times daily. Please, mix all ingredients in equal ratios   meloxicam  (MOBIC ) 15 MG tablet Take 1 tablet (15 mg total) by mouth daily as needed for pain. (Patient taking differently: Take 15 mg by mouth in the morning.)   metoprolol  succinate (TOPROL -XL) 50 MG 24 hr tablet Take with or immediately following a meal.   Multiple Vitamin (MULTIVITAMIN) tablet Take 1 tablet by mouth daily. Equate Senior   naloxone  (NARCAN ) nasal spray 4 mg/0.1 mL Place 0.4 mg into the nose once.   olmesartan  (BENICAR ) 5 MG tablet Take 2 tablets (10 mg total) by mouth daily.   omeprazole  (PRILOSEC) 40 MG capsule TAKE 1 CAPSULE DAILY   oxyCODONE  (OXY IR/ROXICODONE ) 5 MG immediate release tablet Take 1 tablet (5 mg total) by mouth every 6 (six) hours as needed for severe pain (pain score 7-10). (Patient not taking: Reported on 07/14/2023)   oxyCODONE -acetaminophen  (PERCOCET) 10-325 MG tablet Take 1 tablet by mouth every 4 (four) hours. 3AM, 7AM, 11AM, 3 PM, 7PM AND 11PM   Potassium 99 MG TABS Take 4 tablets by mouth daily after breakfast.   prochlorperazine  (COMPAZINE ) 10 MG tablet Take 1 tablet (10 mg total) by mouth every 8 (eight) hours as needed.   promethazine  (PHENERGAN ) 25 MG tablet TAKE 1 TABLET BY MOUTH EVERY 8 HOURS AS NEEDED   sucralfate  (CARAFATE ) 1 g tablet TAKE 1 TABLET BY MOUTH 4 TIMES A DAY WITH MEALS AND AT BEDTIME.   traZODone  (DESYREL ) 150 MG tablet TAKE 2 TABLETS (300 MG TOTAL) BY MOUTH AT BEDTIME   [DISCONTINUED] mirtazapine  (REMERON ) 7.5 MG tablet TAKE 1 TABLET BY MOUTH AT BEDTIME   No facility-administered medications prior to visit.   Flowsheet Row Office Visit from 07/14/2023 in [facility]   PHQ-9 Total Score 8        Review of Systems  Last metabolic panel Lab Results  Component Value Date   GLUCOSE 101 (H) 03/09/2023   NA 141 03/09/2023   K 4.6 03/09/2023   CL 101 03/09/2023   CO2 21 03/09/2023   BUN 14 03/09/2023   CREATININE 0.59 (L) 03/09/2023   EGFR 108 03/09/2023   CALCIUM 9.7 03/09/2023   PHOS 2.2 (L) 05/26/2016   PROT 6.3 03/09/2023   ALBUMIN 4.0 03/09/2023   LABGLOB 2.3 03/09/2023   AGRATIO 2.8 (H) 06/18/2022   BILITOT <0.2 03/09/2023   ALKPHOS 86 03/09/2023   AST 20 03/09/2023   ALT 16 03/09/2023   ANIONGAP  12 07/25/2020   Last lipids Lab Results  Component Value Date   CHOL 175 08/25/2017   HDL 26 (L) 08/25/2017   LDLCALC 113 (H) 08/25/2017   TRIG 180 (H) 08/25/2017   Last hemoglobin A1c Lab Results  Component Value Date   HGBA1C 5.4 11/11/2015   Last thyroid  functions Lab Results  Component Value Date   TSH 1.760 07/14/2023   Last vitamin D No results found for: "25OHVITD2", "25OHVITD3", "VD25OH" Last vitamin B12 and Folate Lab Results  Component Value Date   VITAMINB12 1,672 (H) 09/18/2022   FOLATE 4.3 09/18/2022        Objective    BP 116/68   Pulse 76   Ht 5\' 9"  (1.753 m)   Wt 153 lb (69.4 kg)   SpO2 97%   BMI 22.59 kg/m  BP Readings from Last 3 Encounters:  09/20/23 116/68  07/14/23 (!) 85/58  06/15/23 112/63   Wt Readings from Last 3 Encounters:  09/20/23 153 lb (69.4 kg)  07/14/23 150 lb (68 kg)  06/15/23 151 lb 11.2 oz (68.8 kg)        Physical Exam  General: Alert, no acute distress Cardio: Normal S1 and S2, RRR, no r/m/g Pulm: CTAB, normal work of breathing   No results found for any visits on 09/20/23.   Assessment & Plan     Problem List Items Addressed This Visit       Digestive   Acid reflux     Other   Iron deficiency   Clinical depression   Assessment & Plan Depression, Chronic  Improved  Depression is clinically resistant. Currently managed with Abilify  2 mg daily and  mirtazapine  7.5 mg at bedtime. Reports improvement in mood but still experiences tough periods of depression. Appetite has increased, and weight is up by 3 pounds since the last visit. PHQ-9 scores will be reviewed to assess progress. Has not established care with a therapist or psychiatrist due to scheduling conflicts but acknowledges the need for therapy. Has quit drinking for a couple of months, which may positively impact mood. - Increase mirtazapine  to 15 mg at bedtime. - Continue Abilify  2 mg daily. - Encourage establishing care with a therapist or psychiatrist when schedule allows.      Relevant Medications   mirtazapine  (REMERON  SOL-TAB) 15 MG disintegrating tablet   Chronic neck and back pain   Chronic pain management Reports difficulty managing pain due to reduction in fentanyl  dosage. Pain management follow-up is scheduled for Oct 01, 2023. 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 Current pain management includes fentanyl , oxycodone , acetaminophen , and meloxicam . Discussed the possibility of increasing fentanyl  dosage for better pain control. - Continue current pain management regimen. - Follow up with pain management on Oct 01, 2023.      Relevant Medications   mirtazapine  (REMERON  SOL-TAB) 15 MG disintegrating tablet   Anxiety - Primary   Relevant Medications   mirtazapine  (REMERON  SOL-TAB) 15 MG disintegrating tablet   Assessment & Plan   Insomnia Insomnia is managed with trazodone  300 mg at bedtime. - Continue trazodone  300 mg at bedtime.  Hypertension Hypertension is well controlled.     Return in about 10 weeks (around 11/29/2023) for Depression.         Mimi Alt, MD  [facility] [phone] (phone) [phone] (fax)  [facility]

## 2023-09-29 DIAGNOSIS — M47812 Spondylosis without myelopathy or radiculopathy, cervical region: Secondary | ICD-10-CM | POA: Diagnosis not present

## 2023-09-29 DIAGNOSIS — M961 Postlaminectomy syndrome, not elsewhere classified: Secondary | ICD-10-CM | POA: Diagnosis not present

## 2023-09-29 DIAGNOSIS — G894 Chronic pain syndrome: Secondary | ICD-10-CM | POA: Diagnosis not present

## 2023-09-29 DIAGNOSIS — M47817 Spondylosis without myelopathy or radiculopathy, lumbosacral region: Secondary | ICD-10-CM | POA: Diagnosis not present

## 2023-10-04 ENCOUNTER — Ambulatory Visit: Admitting: Family Medicine

## 2023-10-07 DIAGNOSIS — M47812 Spondylosis without myelopathy or radiculopathy, cervical region: Secondary | ICD-10-CM | POA: Diagnosis not present

## 2023-10-07 DIAGNOSIS — M1712 Unilateral primary osteoarthritis, left knee: Secondary | ICD-10-CM | POA: Diagnosis not present

## 2023-10-07 DIAGNOSIS — M961 Postlaminectomy syndrome, not elsewhere classified: Secondary | ICD-10-CM | POA: Diagnosis not present

## 2023-10-07 DIAGNOSIS — M62830 Muscle spasm of back: Secondary | ICD-10-CM | POA: Diagnosis not present

## 2023-10-17 DIAGNOSIS — M1712 Unilateral primary osteoarthritis, left knee: Secondary | ICD-10-CM | POA: Diagnosis not present

## 2023-10-17 DIAGNOSIS — M62830 Muscle spasm of back: Secondary | ICD-10-CM | POA: Diagnosis not present

## 2023-10-17 DIAGNOSIS — M961 Postlaminectomy syndrome, not elsewhere classified: Secondary | ICD-10-CM | POA: Diagnosis not present

## 2023-10-17 DIAGNOSIS — M47812 Spondylosis without myelopathy or radiculopathy, cervical region: Secondary | ICD-10-CM | POA: Diagnosis not present

## 2023-10-27 ENCOUNTER — Other Ambulatory Visit: Payer: Self-pay | Admitting: Family Medicine

## 2023-10-27 DIAGNOSIS — F32A Depression, unspecified: Secondary | ICD-10-CM

## 2023-10-27 DIAGNOSIS — M47812 Spondylosis without myelopathy or radiculopathy, cervical region: Secondary | ICD-10-CM | POA: Diagnosis not present

## 2023-10-27 DIAGNOSIS — F330 Major depressive disorder, recurrent, mild: Secondary | ICD-10-CM

## 2023-10-27 DIAGNOSIS — M47817 Spondylosis without myelopathy or radiculopathy, lumbosacral region: Secondary | ICD-10-CM | POA: Diagnosis not present

## 2023-10-27 DIAGNOSIS — G894 Chronic pain syndrome: Secondary | ICD-10-CM | POA: Diagnosis not present

## 2023-10-27 DIAGNOSIS — M961 Postlaminectomy syndrome, not elsewhere classified: Secondary | ICD-10-CM | POA: Diagnosis not present

## 2023-11-10 ENCOUNTER — Other Ambulatory Visit: Payer: Self-pay | Admitting: Family Medicine

## 2023-11-10 NOTE — Telephone Encounter (Signed)
 [facility] pharmacy faxed refill request for the following medications:     Doxepin  HCl 6 MG TABS    Please advise

## 2023-11-12 ENCOUNTER — Other Ambulatory Visit: Payer: Self-pay | Admitting: Family Medicine

## 2023-11-12 DIAGNOSIS — R110 Nausea: Secondary | ICD-10-CM

## 2023-11-12 MED ORDER — DOXEPIN HCL 6 MG PO TABS: 1.0000 | ORAL_TABLET | Freq: Every evening | ORAL | 1 refills | Status: DC | PRN

## 2023-11-16 DIAGNOSIS — M47812 Spondylosis without myelopathy or radiculopathy, cervical region: Secondary | ICD-10-CM | POA: Diagnosis not present

## 2023-11-16 DIAGNOSIS — M62830 Muscle spasm of back: Secondary | ICD-10-CM | POA: Diagnosis not present

## 2023-11-16 DIAGNOSIS — M1712 Unilateral primary osteoarthritis, left knee: Secondary | ICD-10-CM | POA: Diagnosis not present

## 2023-11-16 DIAGNOSIS — M961 Postlaminectomy syndrome, not elsewhere classified: Secondary | ICD-10-CM | POA: Diagnosis not present

## 2023-11-19 ENCOUNTER — Other Ambulatory Visit: Payer: Self-pay | Admitting: Family Medicine

## 2023-11-19 DIAGNOSIS — K219 Gastro-esophageal reflux disease without esophagitis: Secondary | ICD-10-CM

## 2023-11-22 ENCOUNTER — Other Ambulatory Visit: Payer: Self-pay | Admitting: Physician Assistant

## 2023-11-22 DIAGNOSIS — F5101 Primary insomnia: Secondary | ICD-10-CM

## 2023-11-24 DIAGNOSIS — M47812 Spondylosis without myelopathy or radiculopathy, cervical region: Secondary | ICD-10-CM | POA: Diagnosis not present

## 2023-11-24 DIAGNOSIS — M961 Postlaminectomy syndrome, not elsewhere classified: Secondary | ICD-10-CM | POA: Diagnosis not present

## 2023-11-24 DIAGNOSIS — M47817 Spondylosis without myelopathy or radiculopathy, lumbosacral region: Secondary | ICD-10-CM | POA: Diagnosis not present

## 2023-11-24 DIAGNOSIS — G894 Chronic pain syndrome: Secondary | ICD-10-CM | POA: Diagnosis not present

## 2023-11-26 ENCOUNTER — Ambulatory Visit: Admitting: Family Medicine

## 2023-11-26 ENCOUNTER — Encounter: Payer: Self-pay | Admitting: Family Medicine

## 2023-11-26 VITALS — BP 117/69 | HR 60 | Ht 69.0 in | Wt 160.0 lb

## 2023-11-26 DIAGNOSIS — M542 Cervicalgia: Secondary | ICD-10-CM | POA: Diagnosis not present

## 2023-11-26 DIAGNOSIS — G8929 Other chronic pain: Secondary | ICD-10-CM | POA: Diagnosis not present

## 2023-11-26 DIAGNOSIS — R110 Nausea: Secondary | ICD-10-CM | POA: Diagnosis not present

## 2023-11-26 DIAGNOSIS — F5101 Primary insomnia: Secondary | ICD-10-CM | POA: Diagnosis not present

## 2023-11-26 DIAGNOSIS — M549 Dorsalgia, unspecified: Secondary | ICD-10-CM | POA: Diagnosis not present

## 2023-11-26 DIAGNOSIS — F330 Major depressive disorder, recurrent, mild: Secondary | ICD-10-CM

## 2023-11-26 DIAGNOSIS — I10 Essential (primary) hypertension: Secondary | ICD-10-CM

## 2023-11-26 DIAGNOSIS — J438 Other emphysema: Secondary | ICD-10-CM | POA: Diagnosis not present

## 2023-11-26 DIAGNOSIS — T402X5S Adverse effect of other opioids, sequela: Secondary | ICD-10-CM

## 2023-11-26 MED ORDER — KETOROLAC TROMETHAMINE 60 MG/2ML IM SOLN
60.0000 mg | Freq: Once | INTRAMUSCULAR | Status: AC
  Administered 2023-11-26: 30 mg via INTRAMUSCULAR

## 2023-11-26 MED ORDER — MELOXICAM 15 MG PO TABS: 15.0000 mg | ORAL_TABLET | Freq: Every day | ORAL | 3 refills | Status: AC | PRN

## 2023-11-26 NOTE — Progress Notes (Signed)
 Established patient visit   Patient: Joshua Guerrero   DOB: 01/19/1956   68 y.o. Male  MRN: [id number] Visit Date: 11/26/2023  Today's healthcare provider: Rockie Agent, MD   Chief Complaint  Patient presents with   Depression    No concerns    Subjective     HPI     Depression    Additional comments: No concerns       Last edited by Thelbert Eulalio HERO, CMA on 11/26/2023  1:31 PM.       Discussed the use of AI scribe software for clinical note transcription with the patient, who gave verbal consent to proceed.  History of Present Illness Joshua Guerrero is a 68 year old male with chronic depression who presents for a follow-up visit.  His mood has been stable and 'pretty good', with no significant depressive symptoms for a while. No thoughts of self-harm or feelings of guilt. He attributes his improved mood to returning to church, which he finds uplifting. His current medication regimen includes mirtazapine  15 mg daily, Pristiq  100 mg daily, and Abilify  2 mg daily. Mirtazapine  also helps with his appetite, and he has gained weight, now weighing 160 pounds, with a goal to reach 180 pounds.  He experiences nausea as a side effect of his pain management regimen, particularly on the first day of a new pain patch. He uses Phenergan  25 mg every eight hours and Compazine  10 mg every eight hours as needed, but not on a strict schedule.  He has a history of COPD and previously used Trelegy inhalers, which were effective, but his insurance no longer covers them. He currently uses albuterol  as needed, though not daily, and reports that his breathing was better when he was on Trelegy.  He experiences back and neck pain and sees a pain management specialist once a month.     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   ARIPiprazole  (ABILIFY ) 2 MG tablet Take 1 tablet (2 mg total) by mouth daily.   Ascorbic Acid (VITAMIN C PO) Take 2 tablets by mouth daily after breakfast.   B Complex-C (B-COMPLEX WITH VITAMIN C) tablet Take 2 tablets by mouth daily after breakfast.   baclofen  (LIORESAL ) 10 MG tablet Take 10 mg by mouth in the morning, at noon, and at bedtime. STARTING AT LUNCHTIME   Cyanocobalamin  (B-12 PO) Take 2 tablets by mouth daily after breakfast.   desvenlafaxine  (PRISTIQ ) 100 MG 24 hr tablet TAKE  1 TABLET BY MOUTH DAILY   Doxepin  HCl 6 MG TABS Take 1 tablet (6 mg total) by mouth at bedtime as needed.   fentaNYL  (DURAGESIC  - DOSED MCG/HR) 25 MCG/HR patch Place 1 patch onto the skin every 3 (three) days.   fentaNYL  (DURAGESIC ) 12 MCG/HR Place 1 patch onto the skin every 3 (three) days.   ferrous sulfate 324 MG TBEC Take 324 mg by mouth daily after breakfast.   Fluticasone-Umeclidin-Vilant (TRELEGY ELLIPTA ) 100-62.5-25 MCG/ACT AEPB Inhale 1 puff into the lungs daily.   magic mouthwash (nystatin , lidocaine , diphenhydrAMINE, alum & mag hydroxide) suspension Swish and spit 5 mLs 4 (four) times daily. Please, mix all ingredients in equal ratios   metoprolol  succinate (TOPROL -XL) 50 MG 24 hr tablet Take with or immediately following a  meal.   mirtazapine  (REMERON  SOL-TAB) 15 MG disintegrating tablet Take 1 tablet (15 mg total) by mouth at bedtime.   Multiple Vitamin (MULTIVITAMIN) tablet Take 1 tablet by mouth daily. Equate Senior   naloxone  (NARCAN ) nasal spray 4 mg/0.1 mL Place 0.4 mg into the nose once.   olmesartan  (BENICAR ) 5 MG tablet Take 2 tablets (10 mg total) by mouth daily.   omeprazole  (PRILOSEC) 40 MG capsule TAKE 1 CAPSULE DAILY   oxyCODONE  (OXY IR/ROXICODONE ) 5 MG immediate release tablet Take 1 tablet (5 mg total) by mouth every 6 (six) hours as needed for severe pain (pain score 7-10). (Patient not taking: Reported on 11/26/2023)   oxyCODONE -acetaminophen  (PERCOCET) 10-325 MG tablet Take 1 tablet by mouth every 4 (four) hours. 3AM, 7AM, 11AM, 3 PM, 7PM AND 11PM   Potassium 99 MG TABS Take 4 tablets by mouth daily after breakfast.   prochlorperazine  (COMPAZINE ) 10 MG tablet TAKE 1 TABLET BY MOUTH EVERY 8 HOURS AS NEEDED   promethazine  (PHENERGAN ) 25 MG tablet TAKE 1 TABLET BY MOUTH EVERY 8 HOURS AS NEEDED   sucralfate  (CARAFATE ) 1 g tablet TAKE 1 TABLET BY MOUTH 4 TIMES A DAY WITH MEALS AND AT BEDTIME.   traZODone  (DESYREL ) 150 MG tablet TAKE 2 TABLETS (300 MG TOTAL) BY MOUTH AT BEDTIME   [DISCONTINUED] meloxicam  (MOBIC ) 15 MG tablet Take 1 tablet (15 mg total) by mouth daily as needed for pain. (Patient taking differently: Take 15 mg by mouth in the morning.)   No facility-administered medications prior to visit.    Review of Systems      Objective    BP 117/69   Pulse 60   Ht 5' 9 (1.753 m)   Wt 160 lb (72.6 kg)   SpO2 99%   BMI 23.63 kg/m  BP Readings from Last 3 Encounters:  11/26/23 117/69  09/20/23 116/68  07/14/23 (!) 85/58   Wt Readings from Last 3 Encounters:  11/26/23 160 lb (72.6 kg)  09/20/23 153 lb (69.4 kg)  07/14/23 150 lb (68 kg)        Physical Exam  Physical Exam MEASUREMENTS: Weight- 160. CHEST: Auditory wheeze, no wheezes or crackles on  auscultation. CARDIOVASCULAR: Regular rate and rhythm. MUSCULOSKELETAL: Diffuse tenderness and muscle spasms in back.    No results found for any visits on 11/26/23.  Assessment & Plan     Problem List Items Addressed This Visit       Cardiovascular and Mediastinum   Primary hypertension     Respiratory   Other emphysema (HCC)     Other   Primary insomnia   Mild episode of recurrent major depressive disorder (HCC) - Primary   Chronic neck and back pain  Relevant Medications   meloxicam  (MOBIC ) 15 MG tablet   Chronic nausea   Back pain, chronic   Relevant Medications   meloxicam  (MOBIC ) 15 MG tablet   Other Visit Diagnoses       Adverse effect of other opioids, sequela          Assessment & Plan Chronic Pain Chronic neck and back pain managed by pain specialist Dr. Cindee with monthly visits. He experiences diffuse tenderness and muscle spasms and requested Toradol  for pain relief. - Prescribe Toradol  30 tablets  Nausea Nausea is a side effect of the pain management regimen, particularly on the first day of a fentanyl  patch. He uses Phenergan  25 mg and Compazine  10 mg as needed. - Continue Phenergan  25 mg every 8 hours as needed - Continue Compazine  10 mg every 8 hours as needed  Chronic Depression Chronic depression managed with mirtazapine  15 mg daily, Pristiq  100 mg daily, and Abilify  2 mg daily. He reports improved mood, sobriety, and upliftment from church attendance. No recent thoughts of self-harm or guilt. - Continue mirtazapine  15 mg daily - Continue Pristiq  100 mg daily - Continue Abilify  2 mg daily  COPD COPD with auditory wheeze but no wheezes or crackles on auscultation. Previously effective Trelegy inhalers are no longer covered by insurance. He uses albuterol  as needed, not daily. Insurance changes affect access to effective medications. - Prescribe a maintenance inhaler to manage wheezing  General Health Maintenance Due for a physical  examination at the end of October. Considering changing insurance plans for better medication coverage. Inquired about affordable hearing aids available at [facility], which are subsidized and well-reviewed. - Schedule physical examination for the end of October - Discuss insurance options to cover necessary medications - Provide information on affordable hearing aids     Return in about 3 months (around 02/29/2024) for CPE.         Rockie Agent, MD  [facility] [phone] (phone) [phone] (fax)  [facility]

## 2023-12-17 DIAGNOSIS — M47812 Spondylosis without myelopathy or radiculopathy, cervical region: Secondary | ICD-10-CM | POA: Diagnosis not present

## 2023-12-17 DIAGNOSIS — M1712 Unilateral primary osteoarthritis, left knee: Secondary | ICD-10-CM | POA: Diagnosis not present

## 2023-12-17 DIAGNOSIS — M62830 Muscle spasm of back: Secondary | ICD-10-CM | POA: Diagnosis not present

## 2023-12-17 DIAGNOSIS — M961 Postlaminectomy syndrome, not elsewhere classified: Secondary | ICD-10-CM | POA: Diagnosis not present

## 2023-12-22 DIAGNOSIS — M961 Postlaminectomy syndrome, not elsewhere classified: Secondary | ICD-10-CM | POA: Diagnosis not present

## 2023-12-22 DIAGNOSIS — M47812 Spondylosis without myelopathy or radiculopathy, cervical region: Secondary | ICD-10-CM | POA: Diagnosis not present

## 2023-12-22 DIAGNOSIS — G894 Chronic pain syndrome: Secondary | ICD-10-CM | POA: Diagnosis not present

## 2023-12-22 DIAGNOSIS — M47817 Spondylosis without myelopathy or radiculopathy, lumbosacral region: Secondary | ICD-10-CM | POA: Diagnosis not present

## 2023-12-24 ENCOUNTER — Ambulatory Visit: Admitting: Physician Assistant

## 2023-12-27 ENCOUNTER — Ambulatory Visit: Admitting: Family Medicine

## 2023-12-27 NOTE — Progress Notes (Deleted)
 ACUTE VISIT   Patient: Joshua Guerrero   DOB: 11/24/1955   68 y.o. Male  MRN: [id number]   PCP: Sharma Coyer, MD  No chief complaint on file.  Subjective    HPI   Discussed the use of AI scribe software for clinical note transcription with the patient, who gave verbal consent to proceed.  History of Present Illness      Medications: Outpatient Medications Prior to Visit  Medication Sig   albuterol  (VENTOLIN  HFA) 108 (90 Base) MCG/ACT inhaler Inhale 2 puffs into the lungs every 4 (four) hours as needed for wheezing or shortness of breath.   ARIPiprazole  (ABILIFY ) 2 MG tablet Take 1 tablet (2 mg total) by mouth daily.   Ascorbic Acid (VITAMIN C PO) Take 2 tablets by mouth daily after breakfast.   B Complex-C (B-COMPLEX WITH VITAMIN C) tablet Take 2 tablets by mouth daily after breakfast.   baclofen  (LIORESAL ) 10 MG tablet Take 10 mg by mouth in the morning, at noon, and at bedtime. STARTING AT LUNCHTIME   Cyanocobalamin  (B-12 PO) Take 2 tablets by mouth daily after breakfast.   desvenlafaxine  (PRISTIQ ) 100 MG 24 hr tablet TAKE 1 TABLET BY MOUTH DAILY   Doxepin  HCl 6 MG TABS Take 1 tablet (6 mg total) by mouth at bedtime as needed.   fentaNYL  (DURAGESIC  - DOSED MCG/HR) 25 MCG/HR patch Place 1 patch onto the skin every 3 (three) days.   fentaNYL  (DURAGESIC ) 12 MCG/HR Place 1 patch onto the skin every 3 (three) days.   ferrous sulfate 324 MG TBEC Take 324 mg by mouth daily after breakfast.   Fluticasone-Umeclidin-Vilant (TRELEGY ELLIPTA ) 100-62.5-25 MCG/ACT AEPB Inhale 1 puff into the lungs daily.   magic mouthwash (nystatin , lidocaine , diphenhydrAMINE, alum & mag hydroxide) suspension Swish and spit 5 mLs 4 (four) times daily. Please, mix all ingredients in equal ratios   meloxicam  (MOBIC ) 15 MG tablet Take 1 tablet (15 mg total) by mouth daily as needed for pain.   metoprolol  succinate (TOPROL -XL) 50 MG 24 hr tablet Take with or immediately following a meal.    mirtazapine  (REMERON  SOL-TAB) 15 MG disintegrating tablet Take 1 tablet (15 mg total) by mouth at bedtime.   Multiple Vitamin (MULTIVITAMIN) tablet Take 1 tablet by mouth daily. Equate Senior   naloxone  (NARCAN ) nasal spray 4 mg/0.1 mL Place 0.4 mg into the nose once.   olmesartan  (BENICAR ) 5 MG tablet Take 2 tablets (10 mg total) by mouth daily.   omeprazole  (PRILOSEC) 40 MG capsule TAKE 1 CAPSULE DAILY   oxyCODONE  (OXY IR/ROXICODONE ) 5 MG immediate release tablet Take 1 tablet (5 mg total) by mouth every 6 (six) hours as needed for severe pain (pain score 7-10). (Patient not taking: Reported on 11/26/2023)   oxyCODONE -acetaminophen  (PERCOCET) 10-325 MG tablet Take 1 tablet by mouth every 4 (four) hours. 3AM, 7AM, 11AM, 3 PM, 7PM AND 11PM   Potassium 99 MG TABS Take 4 tablets by mouth daily after breakfast.   prochlorperazine  (COMPAZINE ) 10 MG tablet TAKE 1 TABLET BY MOUTH EVERY 8 HOURS AS NEEDED   promethazine  (PHENERGAN ) 25 MG tablet TAKE 1 TABLET BY MOUTH EVERY 8 HOURS AS NEEDED   sucralfate  (CARAFATE ) 1 g tablet TAKE 1 TABLET BY MOUTH 4 TIMES A DAY WITH MEALS AND AT BEDTIME.   traZODone  (DESYREL ) 150 MG tablet TAKE 2 TABLETS (300 MG TOTAL) BY MOUTH AT BEDTIME   No facility-administered medications prior to visit.    {Insert previous labs (optional):23779} {See past  labs  Heme  Chem  Endocrine  Serology  Results Review (optional):1}   Objective    There were no vitals taken for this visit. {Insert last BP/Wt (optional):23777}{See vitals history (optional):1}  Physical Exam   Physical Exam    No results found for any visits on 12/27/23.  Assessment & Plan     Assessment and Plan Assessment & Plan       No follow-ups on file.        Rockie Agent, MD  [facility] [phone] (phone) [phone] (fax)  [facility]

## 2023-12-29 ENCOUNTER — Other Ambulatory Visit: Payer: Self-pay | Admitting: Family Medicine

## 2023-12-29 DIAGNOSIS — F339 Major depressive disorder, recurrent, unspecified: Secondary | ICD-10-CM

## 2024-01-17 DIAGNOSIS — M62830 Muscle spasm of back: Secondary | ICD-10-CM | POA: Diagnosis not present

## 2024-01-17 DIAGNOSIS — M961 Postlaminectomy syndrome, not elsewhere classified: Secondary | ICD-10-CM | POA: Diagnosis not present

## 2024-01-17 DIAGNOSIS — M1712 Unilateral primary osteoarthritis, left knee: Secondary | ICD-10-CM | POA: Diagnosis not present

## 2024-01-17 DIAGNOSIS — M47812 Spondylosis without myelopathy or radiculopathy, cervical region: Secondary | ICD-10-CM | POA: Diagnosis not present

## 2024-01-24 DIAGNOSIS — M961 Postlaminectomy syndrome, not elsewhere classified: Secondary | ICD-10-CM | POA: Diagnosis not present

## 2024-01-24 DIAGNOSIS — G894 Chronic pain syndrome: Secondary | ICD-10-CM | POA: Diagnosis not present

## 2024-01-24 DIAGNOSIS — M47817 Spondylosis without myelopathy or radiculopathy, lumbosacral region: Secondary | ICD-10-CM | POA: Diagnosis not present

## 2024-01-24 DIAGNOSIS — M47812 Spondylosis without myelopathy or radiculopathy, cervical region: Secondary | ICD-10-CM | POA: Diagnosis not present

## 2024-02-07 ENCOUNTER — Other Ambulatory Visit: Payer: Self-pay

## 2024-02-07 ENCOUNTER — Telehealth: Payer: Self-pay | Admitting: Family Medicine

## 2024-02-07 DIAGNOSIS — R110 Nausea: Secondary | ICD-10-CM

## 2024-02-07 NOTE — Telephone Encounter (Signed)
 Converted

## 2024-02-07 NOTE — Telephone Encounter (Signed)
 [facility] faxed refill request for the following medications:  promethazine  (PHENERGAN ) 25 MG tablet    Please advise.

## 2024-02-11 MED ORDER — PROMETHAZINE HCL 25 MG PO TABS: 25.0000 mg | ORAL_TABLET | Freq: Three times a day (TID) | ORAL | 3 refills | Status: AC | PRN

## 2024-02-16 ENCOUNTER — Other Ambulatory Visit: Payer: Self-pay | Admitting: Family Medicine

## 2024-02-16 DIAGNOSIS — M1712 Unilateral primary osteoarthritis, left knee: Secondary | ICD-10-CM | POA: Diagnosis not present

## 2024-02-16 DIAGNOSIS — R12 Heartburn: Secondary | ICD-10-CM

## 2024-02-16 DIAGNOSIS — M961 Postlaminectomy syndrome, not elsewhere classified: Secondary | ICD-10-CM | POA: Diagnosis not present

## 2024-02-16 DIAGNOSIS — M47812 Spondylosis without myelopathy or radiculopathy, cervical region: Secondary | ICD-10-CM | POA: Diagnosis not present

## 2024-02-16 DIAGNOSIS — M62830 Muscle spasm of back: Secondary | ICD-10-CM | POA: Diagnosis not present

## 2024-02-23 ENCOUNTER — Telehealth: Payer: Self-pay | Admitting: Family Medicine

## 2024-02-23 DIAGNOSIS — F5101 Primary insomnia: Secondary | ICD-10-CM

## 2024-02-23 DIAGNOSIS — M961 Postlaminectomy syndrome, not elsewhere classified: Secondary | ICD-10-CM | POA: Diagnosis not present

## 2024-02-23 DIAGNOSIS — M47812 Spondylosis without myelopathy or radiculopathy, cervical region: Secondary | ICD-10-CM | POA: Diagnosis not present

## 2024-02-23 DIAGNOSIS — M47817 Spondylosis without myelopathy or radiculopathy, lumbosacral region: Secondary | ICD-10-CM | POA: Diagnosis not present

## 2024-02-23 DIAGNOSIS — G894 Chronic pain syndrome: Secondary | ICD-10-CM | POA: Diagnosis not present

## 2024-02-23 NOTE — Telephone Encounter (Signed)
 [facility] faxed refill request for the following medications:  traZODone  (DESYREL ) 150 MG tablet    Please advise.

## 2024-02-24 NOTE — Telephone Encounter (Signed)
  Last Visit: 11/26/2023 Next Visit: 02/29/2024 Last Refill: 11/23/2023 #[id number]  Please Advise

## 2024-02-25 MED ORDER — TRAZODONE HCL 150 MG PO TABS: 150.0000 mg | ORAL_TABLET | Freq: Every day | ORAL | 0 refills | Status: DC

## 2024-02-25 NOTE — Telephone Encounter (Unsigned)
 Copied from CRM [id number]. Topic: Clinical - Prescription Issue >> Feb 25, 2024 11:48 AM Tinnie BROCKS wrote: Reason for CRM: [facility], Alan, calling to clarify instructions for trazadone. He was on 300mg  at bed time, but now it has changed to 150mg  at bedtime. She wants to clarify if this is correct. Please call at #[phone]

## 2024-02-28 ENCOUNTER — Telehealth: Payer: Self-pay | Admitting: Family Medicine

## 2024-02-28 ENCOUNTER — Telehealth: Payer: Self-pay

## 2024-02-28 NOTE — Telephone Encounter (Unsigned)
 Copied from CRM [id number]. Topic: Clinical - Medication Question >> Feb 28, 2024 11:26 AM Lonell PEDLAR wrote: Reason for CRM: Patient called regarding   Trazadone rx. States that he needs 2 tablets a night, 1 tablet does not help pt. Please clarify.

## 2024-02-28 NOTE — Telephone Encounter (Signed)
 [facility] faxed refill request for the following medications:   prochlorperazine  (COMPAZINE ) 10 MG tablet    Please advise.

## 2024-02-29 ENCOUNTER — Encounter: Payer: Self-pay | Admitting: Family Medicine

## 2024-02-29 ENCOUNTER — Other Ambulatory Visit: Payer: Self-pay

## 2024-02-29 ENCOUNTER — Encounter: Admitting: Family Medicine

## 2024-02-29 ENCOUNTER — Ambulatory Visit: Admitting: Family Medicine

## 2024-02-29 VITALS — BP 146/70 | HR 63 | Temp 99.5°F | Ht 69.0 in | Wt 160.8 lb

## 2024-02-29 DIAGNOSIS — G4733 Obstructive sleep apnea (adult) (pediatric): Secondary | ICD-10-CM

## 2024-02-29 DIAGNOSIS — Z131 Encounter for screening for diabetes mellitus: Secondary | ICD-10-CM

## 2024-02-29 DIAGNOSIS — F17210 Nicotine dependence, cigarettes, uncomplicated: Secondary | ICD-10-CM

## 2024-02-29 DIAGNOSIS — R110 Nausea: Secondary | ICD-10-CM | POA: Diagnosis not present

## 2024-02-29 DIAGNOSIS — Z0001 Encounter for general adult medical examination with abnormal findings: Secondary | ICD-10-CM

## 2024-02-29 DIAGNOSIS — E7849 Other hyperlipidemia: Secondary | ICD-10-CM

## 2024-02-29 DIAGNOSIS — M5412 Radiculopathy, cervical region: Secondary | ICD-10-CM

## 2024-02-29 DIAGNOSIS — F1020 Alcohol dependence, uncomplicated: Secondary | ICD-10-CM | POA: Diagnosis not present

## 2024-02-29 DIAGNOSIS — I10 Essential (primary) hypertension: Secondary | ICD-10-CM

## 2024-02-29 DIAGNOSIS — G8929 Other chronic pain: Secondary | ICD-10-CM

## 2024-02-29 DIAGNOSIS — J438 Other emphysema: Secondary | ICD-10-CM

## 2024-02-29 DIAGNOSIS — F5101 Primary insomnia: Secondary | ICD-10-CM | POA: Diagnosis not present

## 2024-02-29 DIAGNOSIS — M549 Dorsalgia, unspecified: Secondary | ICD-10-CM | POA: Diagnosis not present

## 2024-02-29 DIAGNOSIS — Z130 Encounter for screening for diseases of the blood and blood-forming organs and certain disorders involving the immune mechanism: Secondary | ICD-10-CM

## 2024-02-29 DIAGNOSIS — K219 Gastro-esophageal reflux disease without esophagitis: Secondary | ICD-10-CM

## 2024-02-29 DIAGNOSIS — F339 Major depressive disorder, recurrent, unspecified: Secondary | ICD-10-CM

## 2024-02-29 DIAGNOSIS — Z1159 Encounter for screening for other viral diseases: Secondary | ICD-10-CM

## 2024-02-29 DIAGNOSIS — E611 Iron deficiency: Secondary | ICD-10-CM

## 2024-02-29 DIAGNOSIS — Z0000 Encounter for general adult medical examination without abnormal findings: Secondary | ICD-10-CM | POA: Insufficient documentation

## 2024-02-29 MED ORDER — METOPROLOL SUCCINATE ER 50 MG PO TB24: ORAL_TABLET | ORAL | 2 refills | Status: AC

## 2024-02-29 MED ORDER — KETOROLAC TROMETHAMINE 30 MG/ML IJ SOLN: 30.0000 mg | Freq: Once | INTRAMUSCULAR | Status: DC

## 2024-02-29 MED ORDER — KETOROLAC TROMETHAMINE 60 MG/2ML IM SOLN
60.0000 mg | Freq: Once | INTRAMUSCULAR | Status: AC
  Administered 2024-02-29: 30 mg via INTRAMUSCULAR

## 2024-02-29 MED ORDER — TRAZODONE HCL 300 MG PO TABS: 300.0000 mg | ORAL_TABLET | Freq: Every day | ORAL | 2 refills | Status: AC

## 2024-02-29 MED ORDER — PROCHLORPERAZINE MALEATE 10 MG PO TABS: 10.0000 mg | ORAL_TABLET | Freq: Three times a day (TID) | ORAL | 1 refills | Status: DC | PRN

## 2024-02-29 NOTE — Telephone Encounter (Signed)
 Pt has requested refill to soon. Pt should have enough til February 2026

## 2024-02-29 NOTE — Progress Notes (Signed)
 Complete physical exam   Patient: Joshua Guerrero   DOB: 18-Nov-1955   68 y.o. Male  MRN: [id number] Visit Date: 02/29/2024  Today's healthcare provider: Rockie Agent, MD   Chief Complaint  Patient presents with   Annual Exam    Diet is normal per patient, reports he does v8/ protein shake for breakfast and has lunch usually tuna or egg salad sandwich. 3 meals daily, donates plasma so he tries to eat higher protein. Exercises by walking with dog, pet recently passed so has not done this recently.  Vaccines: Shingles- patient will reach out to pharmacy Screenings: Hep C, lung cancer- patient declines today   Medication Problem    Wanted to verify that it is okay to take 300 mg trazodone  last filled for 180 tab at 150mg - ok to take 2 for 90 day supply?   Pain    Patient reports pain all over today, requests toradol  injection if able today. Neck, lower back and head is most bothersome today.    Subjective    Joshua Guerrero is a 69 y.o. male who presents today for a complete physical exam.    He does have additional problems to discuss today.   Discussed the use of AI scribe software for clinical note transcription with the patient, who gave verbal consent to proceed.  History of Present Illness Joshua Guerrero is a 68 year old male who presents for an annual physical exam.  He is currently taking Abilify  2 mg daily for chronic depression and trazodone  300 mg for sleep, which has been effective in improving his sleep quality. He inquired about the safety of taking two 150 mg tablets of trazodone  to achieve this dose.  He has a history of acid reflux, alcohol use disorder, sleep apnea, chronic back pain, chronic nausea, hyperlipidemia, iron deficiency, emphysema, hypertension, and insomnia. He experiences pain in his neck, lower back, and head, with the head pain being most bothersome today.  He has a greater than thirty pack-year smoking history and reports  swelling in his left leg. He notes that the swelling is not as severe as it was previously.  He mentioned that his dog, Justice, a pit bull, passed away two weeks ago after suffering from cancer, which has been emotionally challenging for him.     Past Medical History:  Diagnosis Date   Arthritis    lower back   Asthma    Brain bleed (HCC) 10/2016   AT [facility]-TREATED MEDICALLY NO SURGERY   Confusion caused by a drug    fentanyl  and oxycodone    COPD (chronic obstructive pulmonary disease) (HCC)    Dyspnea    GERD (gastroesophageal reflux disease)    Headache    past hx, secondary to nerve damage from accident   History of kidney stones    Hypertension    CONTROLLED ON MEDS   Neck pain, chronic    Neuromuscular disorder (HCC)    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   Pneumonia    Salicylate overdose 05/24/2016   Sleep apnea    Not currently using a C-Pap   Past Surgical History:  Procedure Laterality Date   COLONOSCOPY WITH PROPOFOL  N/A 02/11/2015   Procedure: COLONOSCOPY WITH PROPOFOL ;  Surgeon: Rogelia Copping, MD;  Location[facility];  Service: Endoscopy;  Laterality: N/A;  USES C-PAP   ESOPHAGEAL DILATION N/A 10/14/2018   Procedure: ESOPHAGEAL DILATION;  Surgeon: Copping Rogelia, MD;  Location: [facility];  Service: Endoscopy;  Laterality: N/A;   ESOPHAGOGASTRODUODENOSCOPY (EGD) WITH PROPOFOL  N/A 10/14/2018   Procedure: ESOPHAGOGASTRODUODENOSCOPY (EGD) WITH BIOPSY;  Surgeon: Jinny Carmine, MD;  Location: [facility];  Service: Endoscopy;  Laterality: N/A;  sleep apnea   INSERTION OF MESH Right 03/05/2023   Procedure: INSERTION OF MESH;  Surgeon: Lane Shope, MD;  Location: [facility] OR[facility];  Service: General;  Laterality: Right;   NASAL SINUS SURGERY     due to severe sleep apnea   NECK SURGERY     diffuse disc-and put in possibly screws patient thinks   SHOULDER SURGERY Bilateral    x 2   UMBILICAL HERNIA REPAIR N/A 02/26/2017   Primary  repair.  Surgeon: Dessa Reyes ORN, MD;  Location: [facility] ORS;  Service: General;  Laterality: N/A;   VIDEO BRONCHOSCOPY WITH ENDOBRONCHIAL NAVIGATION N/A 07/29/2020   Procedure: VIDEO BRONCHOSCOPY WITH ENDOBRONCHIAL NAVIGATION;  Surgeon: Tamea Dedra CROME, MD;  Location: [facility] OR[facility];  Service: Pulmonary;  Laterality: N/A;   VIDEO BRONCHOSCOPY WITH ENDOBRONCHIAL ULTRASOUND N/A 07/29/2020   Procedure: VIDEO BRONCHOSCOPY WITH ENDOBRONCHIAL ULTRASOUND;  Surgeon: Tamea Dedra CROME, MD;  Location: [facility] OR[facility];  Service: Pulmonary;  Laterality: N/A;   Social History   Socioeconomic History   Marital status: Divorced    Spouse name: Not on file   Number of children: 0   Years of education: 12   Highest education level: High school graduate  Occupational History   Occupation: disability  Tobacco Use   Smoking status: Every Day    Current packs/day: 0.50    Average packs/day: 0.9 packs/day for 40.8 years (36.2 ttl pk-yrs)    Types: Cigarettes    Start date: 37    Last attempt to quit: 1998   Smokeless tobacco: Never   Tobacco comments:    currently about 1/2 PPD--02/20/2022  Vaping Use   Vaping status: Never Used  Substance and Sexual Activity   Alcohol use: No   Drug use: No   Sexual activity: Never  Other Topics Concern   Not on file  Social History Narrative   Lives alone   Social Drivers of Health   Financial Resource Strain: Low Risk  (06/02/2023)   Overall Financial Resource Strain (CARDIA)    Difficulty of Paying Living Expenses: Not hard at all  Food Insecurity: No Food Insecurity (06/02/2023)   Hunger Vital Sign    Worried About Running Out of Food in the Last Year: Never true    Ran Out of Food in the Last Year: Never true  Transportation Needs: No Transportation Needs (06/02/2023)   PRAPARE - Administrator, Civil Service (Medical): No    Lack of Transportation (Non-Medical): No  Physical Activity: Insufficiently Active (06/02/2023)   Exercise Vital Sign     Days of Exercise per Week: 4 days    Minutes of Exercise per Session: 20 min  Stress: No Stress Concern Present (06/02/2023)   [employer] of Occupational Health - Occupational Stress Questionnaire    Feeling of Stress : Only a little  Social Connections: Moderately Isolated (06/02/2023)   Social Connection and Isolation Panel    Frequency of Communication with Friends and Family: More than three times a week    Frequency of Social Gatherings with Friends and Family: More than three times a week    Attends Religious Services: More than 4 times per year    Active Member of [employer] or Organizations: No    Attends Banker Meetings: Never  Marital Status: Divorced  Catering Manager Violence: Not At Risk (06/02/2023)   Humiliation, Afraid, Rape, and Kick questionnaire    Fear of Current or Ex-Partner: No    Emotionally Abused: No    Physically Abused: No    Sexually Abused: No   Family Status  Relation Name Status   Father  Deceased   Mother  Deceased   Sister  Deceased   Mat Aunt  Deceased   Bruna Brigham  (Not Specified)   Nutritional Therapist  (Not Specified)  No partnership data on file   Family History  Problem Relation Age of Onset   Dementia Father    Melanoma Mother    Cancer Paternal Uncle        unknown   Cancer Paternal Uncle        unknown   Allergies  Allergen Reactions   Acetaminophen  Shortness Of Breath   Codeine Itching and Nausea And Vomiting   Opana [Oxymorphone] Swelling     Medications: Outpatient Medications Prior to Visit  Medication Sig   albuterol  (VENTOLIN  HFA) 108 (90 Base) MCG/ACT inhaler Inhale 2 puffs into the lungs every 4 (four) hours as needed for wheezing or shortness of breath.   ARIPiprazole  (ABILIFY ) 2 MG tablet TAKE 1 TABLET BY MOUTH DAILY   Ascorbic Acid (VITAMIN C PO) Take 2 tablets by mouth daily after breakfast.   B Complex-C (B-COMPLEX WITH VITAMIN C) tablet Take 2 tablets by mouth daily after breakfast.   baclofen  (LIORESAL )  10 MG tablet Take 10 mg by mouth in the morning, at noon, and at bedtime. STARTING AT LUNCHTIME   Cyanocobalamin  (B-12 PO) Take 2 tablets by mouth daily after breakfast.   desvenlafaxine  (PRISTIQ ) 100 MG 24 hr tablet TAKE 1 TABLET BY MOUTH DAILY   Doxepin  HCl 6 MG TABS Take 1 tablet (6 mg total) by mouth at bedtime as needed.   fentaNYL  (DURAGESIC  - DOSED MCG/HR) 25 MCG/HR patch Place 1 patch onto the skin every 3 (three) days.   fentaNYL  (DURAGESIC ) 12 MCG/HR Place 1 patch onto the skin every 3 (three) days.   ferrous sulfate 324 MG TBEC Take 324 mg by mouth daily after breakfast.   Fluticasone-Umeclidin-Vilant (TRELEGY ELLIPTA ) 100-62.5-25 MCG/ACT AEPB Inhale 1 puff into the lungs daily.   magic mouthwash (nystatin , lidocaine , diphenhydrAMINE, alum & mag hydroxide) suspension Swish and spit 5 mLs 4 (four) times daily. Please, mix all ingredients in equal ratios   meloxicam  (MOBIC ) 15 MG tablet Take 1 tablet (15 mg total) by mouth daily as needed for pain.   mirtazapine  (REMERON  SOL-TAB) 15 MG disintegrating tablet Take 1 tablet (15 mg total) by mouth at bedtime.   Multiple Vitamin (MULTIVITAMIN) tablet Take 1 tablet by mouth daily. Equate Senior   naloxone  (NARCAN ) nasal spray 4 mg/0.1 mL Place 0.4 mg into the nose once.   olmesartan  (BENICAR ) 5 MG tablet Take 2 tablets (10 mg total) by mouth daily.   omeprazole  (PRILOSEC) 40 MG capsule TAKE 1 CAPSULE DAILY   oxyCODONE  (OXY IR/ROXICODONE ) 5 MG immediate release tablet Take 1 tablet (5 mg total) by mouth every 6 (six) hours as needed for severe pain (pain score 7-10).   oxyCODONE -acetaminophen  (PERCOCET) 10-325 MG tablet Take 1 tablet by mouth every 4 (four) hours. 3AM, 7AM, 11AM, 3 PM, 7PM AND 11PM   Potassium 99 MG TABS Take 4 tablets by mouth daily after breakfast.   prochlorperazine  (COMPAZINE ) 10 MG tablet TAKE 1 TABLET BY MOUTH EVERY 8 HOURS AS NEEDED  promethazine  (PHENERGAN ) 25 MG tablet Take 1 tablet (25 mg total) by mouth every 8  (eight) hours as needed.   sucralfate  (CARAFATE ) 1 g tablet TAKE 1 TABLET BY MOUTH 4 TIMES DAILY WITH MEALS AND AT BEDTIME   [DISCONTINUED] metoprolol  succinate (TOPROL -XL) 50 MG 24 hr tablet Take with or immediately following a meal.   [DISCONTINUED] traZODone  (DESYREL ) 150 MG tablet Take 1 tablet (150 mg total) by mouth at bedtime.   No facility-administered medications prior to visit.    Review of Systems  Last CBC Lab Results  Component Value Date   WBC 9.7 01/20/2023   HGB 14.8 01/20/2023   HCT 44.6 01/20/2023   MCV 94 01/20/2023   MCH 31.1 01/20/2023   RDW 13.4 01/20/2023   PLT 263 01/20/2023   Last metabolic panel Lab Results  Component Value Date   GLUCOSE 101 (H) 03/09/2023   NA 141 03/09/2023   K 4.6 03/09/2023   CL 101 03/09/2023   CO2 21 03/09/2023   BUN 14 03/09/2023   CREATININE 0.59 (L) 03/09/2023   EGFR 108 03/09/2023   CALCIUM 9.7 03/09/2023   PHOS 2.2 (L) 05/26/2016   PROT 6.3 03/09/2023   ALBUMIN 4.0 03/09/2023   LABGLOB 2.3 03/09/2023   AGRATIO 2.8 (H) 06/18/2022   BILITOT <0.2 03/09/2023   ALKPHOS 86 03/09/2023   AST 20 03/09/2023   ALT 16 03/09/2023   ANIONGAP 12 07/25/2020   Last lipids Lab Results  Component Value Date   CHOL 175 08/25/2017   HDL 26 (L) 08/25/2017   LDLCALC 113 (H) 08/25/2017   TRIG 180 (H) 08/25/2017   The ASCVD Risk score (Arnett DK, et al., 2019) failed to calculate for the following reasons:   Cannot find a previous HDL lab   Cannot find a previous total cholesterol lab  Last hemoglobin A1c Lab Results  Component Value Date   HGBA1C 5.4 11/11/2015   Last thyroid  functions Lab Results  Component Value Date   TSH 1.760 07/14/2023   FREET4 0.63 (L) 07/14/2023   Last vitamin D No results found for: 25OHVITD2, 25OHVITD3, VD25OH Last vitamin B12 and Folate Lab Results  Component Value Date   VITAMINB12 1,672 (H) 09/18/2022   FOLATE 4.3 09/18/2022       Objective    BP (!) 146/70 (BP Location:  Left Arm, Patient Position: Sitting, Cuff Size: Normal) Comment: manual  Pulse 63   Temp 99.5 F (37.5 C) (Oral)   Ht 5' 9 (1.753 m)   Wt 160 lb 12.8 oz (72.9 kg)   SpO2 99%   BMI 23.75 kg/m  BP Readings from Last 3 Encounters:  02/29/24 (!) 146/70  11/26/23 117/69  09/20/23 116/68   Wt Readings from Last 3 Encounters:  02/29/24 160 lb 12.8 oz (72.9 kg)  11/26/23 160 lb (72.6 kg)  09/20/23 153 lb (69.4 kg)        Physical Exam Constitutional:      General: He is not in acute distress.    Appearance: Normal appearance. He is not ill-appearing.  HENT:     Mouth/Throat:     Mouth: Mucous membranes are moist.  Eyes:     General: No scleral icterus.       Right eye: No discharge.        Left eye: No discharge.     Extraocular Movements: Extraocular movements intact.     Conjunctiva/sclera: Conjunctivae normal.     Pupils: Pupils are equal, round, and reactive to light.  Cardiovascular:  Rate and Rhythm: Normal rate and regular rhythm.     Heart sounds: Normal heart sounds.  Pulmonary:     Effort: Pulmonary effort is normal.     Breath sounds: Normal breath sounds.  Abdominal:     General: Bowel sounds are normal. There is no distension.     Palpations: Abdomen is soft.     Tenderness: There is no abdominal tenderness.  Musculoskeletal:        General: No deformity or signs of injury.     Cervical back: Neck supple. No tenderness. Pain with movement present. Decreased range of motion.     Right lower leg: No edema.     Left lower leg: No edema.  Lymphadenopathy:     Cervical: No cervical adenopathy.  Neurological:     Mental Status: He is alert and oriented to person, place, and time.     Cranial Nerves: No cranial nerve deficit.     Motor: No weakness.     Gait: Gait normal.  Psychiatric:        Mood and Affect: Mood normal.        Behavior: Behavior normal.       Last depression screening scores    02/29/2024    2:44 PM 11/26/2023    1:33 PM  07/14/2023    1:35 PM  PHQ 2/9 Scores  PHQ - 2 Score 2 0 3  PHQ- 9 Score 3 3 8     Last fall risk screening    02/29/2024    2:44 PM  Fall Risk   Falls in the past year? 0  Number falls in past yr: 0  Injury with Fall? 0  Risk for fall due to : No Fall Risks  Follow up Falls evaluation completed    Last Audit-C alcohol use screening    06/02/2023    2:43 PM  Alcohol Use Disorder Test (AUDIT)  1. How often do you have a drink containing alcohol? 0  2. How many drinks containing alcohol do you have on a typical day when you are drinking? 0  3. How often do you have six or more drinks on one occasion? 0  AUDIT-C Score 0   A score of 3 or more in women, and 4 or more in men indicates increased risk for alcohol abuse, EXCEPT if all of the points are from question 1   No results found for any visits on 02/29/24.  Assessment & Plan    Routine Health Maintenance and Physical Exam  Immunization History  Administered Date(s) Administered   Fluad Trivalent(High Dose 65+) 01/20/2023   INFLUENZA, HIGH DOSE SEASONAL PF 02/23/2024   Influenza,inj,Quad PF,6+ Mos 02/20/2015, 05/26/2016, 12/23/2016, 03/07/2018, 03/22/2019, 04/25/2020, 03/04/2021, 02/25/2022   Moderna Covid-19 Fall Seasonal Vaccine 44yrs & older 03/31/2022, 03/19/2023   PFIZER(Purple Top)SARS-COV-2 Vaccination 08/31/2019, 09/28/2019, 03/11/2020, 03/12/2020   PNEUMOCOCCAL CONJUGATE-20 02/25/2022   Pfizer Covid-19 Vaccine Bivalent Booster 48yrs & up 03/17/2021   Pfizer(Comirnaty)Fall Seasonal Vaccine 12 years and older 02/23/2024   Pneumococcal Polysaccharide-23 05/26/2016   Td 08/17/2018   Tdap 07/31/2008, 08/17/2018    Health Maintenance  Topic Date Due   Zoster Vaccines- Shingrix (1 of 2) 05/31/2024 (Originally 10/31/2007)   Lung Cancer Screening  02/28/2025 (Originally 11/12/2021)   Hepatitis C Screening  02/28/2025 (Originally 10[birth year]31/1975)   Medicare Annual Wellness (AWV)  06/01/2024   COVID-19 Vaccine (9 - 2025-26  season) 08/23/2024   Colonoscopy  02/10/2025   DTaP/Tdap/Td (4 - Td or Tdap) 08/16/2028  Pneumococcal Vaccine: 50+ Years  Completed   Influenza Vaccine  Completed   Meningococcal B Vaccine  Aged Out    Problem List Items Addressed This Visit     Acid reflux   Relevant Orders   CBC   Vitamin B12   Alcohol use disorder, moderate, dependence (HCC)   Relevant Orders   CBC   CMP14+EGFR   Annual physical exam - Primary   Apnea, sleep   Back pain, chronic   Relevant Medications   ketorolac  (TORADOL ) 30 MG/ML injection 30 mg (Start on 02/29/2024  3:15 PM)   traZODone  (DESYREL ) 300 MG tablet   Cervical nerve root disorder   Relevant Medications   ketorolac  (TORADOL ) 30 MG/ML injection 30 mg (Start on 02/29/2024  3:15 PM)   traZODone  (DESYREL ) 300 MG tablet   Chronic nausea   HLD (hyperlipidemia)   Relevant Medications   metoprolol  succinate (TOPROL -XL) 50 MG 24 hr tablet   Other Relevant Orders   Lipid panel   Iron deficiency   Relevant Orders   CBC   Other emphysema (HCC)   Primary hypertension   Relevant Medications   metoprolol  succinate (TOPROL -XL) 50 MG 24 hr tablet   Other Relevant Orders   CMP14+EGFR   Primary insomnia   Relevant Medications   traZODone  (DESYREL ) 300 MG tablet   Other Relevant Orders   TSH + free T4   Smokes with greater than 30 pack year history   Relevant Orders   Ambulatory Referral for Lung Cancer Screening [REF832]   Other Visit Diagnoses       Essential hypertension       Relevant Medications   metoprolol  succinate (TOPROL -XL) 50 MG 24 hr tablet     Screening for diabetes mellitus       Relevant Orders   Hemoglobin A1c     Screening for deficiency anemia         Encounter for hepatitis C screening test for low risk patient         Depression, recurrent       Relevant Medications   traZODone  (DESYREL ) 300 MG tablet       Assessment and Plan Assessment & Plan Adult Wellness Visit Annual physical examination conducted.  Recommended lung cancer screening with low dose chest CT as last screening was in 2022. - Order lung cancer screening with low dose chest CT - Schedule annual wellness visit for February 2026  Chronic insomnia Chronic insomnia managed with trazodone . Reports improved sleep with 300 mg dose. - Increase trazodone  dose to 300 mg for insomnia  Chronic depression Chronic depression managed with Abilify  2 mg daily. - Continue Abilify  2 mg daily  Chronic back and neck pain with cervical radiculopathy Reports pain in neck, lower back, and head. Pain is most bothersome today. - Administer Toradol  30 mg injection for pain relief  Obstructive sleep apnea Obstructive sleep apnea noted.  Essential hypertension Blood pressure recorded at 146/70 mmHg. - Refill metoprolol  prescription  Hyperlipidemia Hyperlipidemia noted. - Order lipid panel  Iron deficiency Iron deficiency noted. - Order CBC to assess iron levels  Chronic nausea Chronic nausea noted.  Other emphysema Emphysema noted.  Nicotine  dependence, cigarettes Greater than 30 pack-year history of smoking.  Chronic left lower extremity swelling Reports chronic swelling in left lower extremity with trace pitting edema. History of surgeries and injuries to the left leg. No acute concerns noted today.  General Health Maintenance Recommended hepatitis C screening and Shingrix vaccine. Up to date on flu and COVID  vaccinations. - Administer Shingrix vaccine - Document patient declined hepatitis C screening  Follow-Up Discussed options for obtaining laboratory tests as lab personnel unavailable today. - Order A1c, CBC, CMP, lipid panel, vitamin B12, TSH, and T4 tests - Advise patient to return for lab work or visit an external lab when convenient       Return in about 4 months (around 07/01/2024) for Chronic F/U.       Rockie Agent, MD  [facility] [phone]  (phone) [phone] (fax)  [facility]

## 2024-02-29 NOTE — Patient Instructions (Addendum)
  You can visit the lab without an appointment Mon-Fri between 8A-11:30A or 1P-4:30P.     To keep you healthy, please keep in mind the following health maintenance items that you are due for:   There are no preventive care reminders to display for this patient.   Best Wishes,   Dr. Lang

## 2024-03-01 NOTE — Telephone Encounter (Signed)
 Issue addressed during office visit on 02/29/24

## 2024-03-22 DIAGNOSIS — M47817 Spondylosis without myelopathy or radiculopathy, lumbosacral region: Secondary | ICD-10-CM | POA: Diagnosis not present

## 2024-03-22 DIAGNOSIS — G894 Chronic pain syndrome: Secondary | ICD-10-CM | POA: Diagnosis not present

## 2024-03-22 DIAGNOSIS — M961 Postlaminectomy syndrome, not elsewhere classified: Secondary | ICD-10-CM | POA: Diagnosis not present

## 2024-03-22 DIAGNOSIS — M47812 Spondylosis without myelopathy or radiculopathy, cervical region: Secondary | ICD-10-CM | POA: Diagnosis not present

## 2024-04-13 DIAGNOSIS — M47816 Spondylosis without myelopathy or radiculopathy, lumbar region: Secondary | ICD-10-CM | POA: Diagnosis not present

## 2024-04-17 DIAGNOSIS — M1712 Unilateral primary osteoarthritis, left knee: Secondary | ICD-10-CM | POA: Diagnosis not present

## 2024-04-17 DIAGNOSIS — M62830 Muscle spasm of back: Secondary | ICD-10-CM | POA: Diagnosis not present

## 2024-04-17 DIAGNOSIS — M47812 Spondylosis without myelopathy or radiculopathy, cervical region: Secondary | ICD-10-CM | POA: Diagnosis not present

## 2024-04-17 DIAGNOSIS — M961 Postlaminectomy syndrome, not elsewhere classified: Secondary | ICD-10-CM | POA: Diagnosis not present

## 2024-04-19 DIAGNOSIS — M47817 Spondylosis without myelopathy or radiculopathy, lumbosacral region: Secondary | ICD-10-CM | POA: Diagnosis not present

## 2024-04-19 DIAGNOSIS — M47812 Spondylosis without myelopathy or radiculopathy, cervical region: Secondary | ICD-10-CM | POA: Diagnosis not present

## 2024-04-19 DIAGNOSIS — G894 Chronic pain syndrome: Secondary | ICD-10-CM | POA: Diagnosis not present

## 2024-04-19 DIAGNOSIS — M961 Postlaminectomy syndrome, not elsewhere classified: Secondary | ICD-10-CM | POA: Diagnosis not present

## 2024-04-24 ENCOUNTER — Other Ambulatory Visit: Payer: Self-pay | Admitting: Family Medicine

## 2024-04-28 ENCOUNTER — Telehealth: Payer: Self-pay | Admitting: Family Medicine

## 2024-04-28 ENCOUNTER — Other Ambulatory Visit: Payer: Self-pay | Admitting: Family Medicine

## 2024-04-28 DIAGNOSIS — F330 Major depressive disorder, recurrent, mild: Secondary | ICD-10-CM

## 2024-04-28 NOTE — Telephone Encounter (Signed)
 Created in error

## 2024-04-28 NOTE — Telephone Encounter (Signed)
 Refill Request   Pharmacy: [facility]  Medication: desvenlafaxine  (PRISTIQ ) 100 MG 24 hr tablet   Quantity (if provided): 90  Please send in refill request.

## 2024-05-01 ENCOUNTER — Other Ambulatory Visit: Payer: Self-pay | Admitting: Family Medicine

## 2024-05-01 DIAGNOSIS — F32A Depression, unspecified: Secondary | ICD-10-CM

## 2024-05-01 DIAGNOSIS — F330 Major depressive disorder, recurrent, mild: Secondary | ICD-10-CM

## 2024-05-01 MED ORDER — DESVENLAFAXINE SUCCINATE ER 100 MG PO TB24: 100.0000 mg | ORAL_TABLET | Freq: Every day | ORAL | 1 refills | Status: AC

## 2024-05-01 NOTE — Telephone Encounter (Unsigned)
 Copied from CRM #[id number]. Topic: Clinical - Medication Refill >> May 01, 2024  2:51 PM Jasmin G wrote: Medication: desvenlafaxine  (PRISTIQ ) 100 MG 24 hr tablet  Has the patient contacted their pharmacy? No (Agent: If no, request that the patient contact the pharmacy for the refill. If patient does not wish to contact the pharmacy document the reason why and proceed with request.) (Agent: If yes, when and what did the pharmacy advise?)  This is the patient's preferred pharmacy:  [facility] - [place], [place] - [address] [place] [address] Phone: [phone] Fax: [phone]  Is this the correct pharmacy for this prescription? Yes If no, delete pharmacy and type the correct one.   Has the prescription been filled recently? No  Is the patient out of the medication? Yes  Has the patient been seen for an appointment in the last year OR does the patient have an upcoming appointment? Yes  Can we respond through MyChart? No  Agent: Please be advised that Rx refills may take up to 3 business days. We ask that you follow-up with your pharmacy.

## 2024-05-01 NOTE — Telephone Encounter (Signed)
 Last Visit: 02/29/2024 Next Visit: 07/03/2024 Last Refill: 10/27/2023 #[id number] [id number]  Please Advise

## 2024-05-01 NOTE — Telephone Encounter (Signed)
 [facility] faxed refill request for the following medications:  desvenlafaxine  (PRISTIQ ) 100 MG 24 hr tablet     Please advise.

## 2024-05-02 NOTE — Telephone Encounter (Signed)
 Requested Prescriptions  Refused Prescriptions Disp Refills   desvenlafaxine  (PRISTIQ ) 100 MG 24 hr tablet 90 tablet 1    Sig: Take 1 tablet (100 mg total) by mouth daily.     Psychiatry: Antidepressants - SNRI - desvenlafaxine  & venlafaxine  Failed - 05/02/2024  5:29 PM      Failed - Cr in normal range and within 360 days    Creatinine  Date Value Ref Range Status  12/12/2011 1.09 0.60 - 1.30 mg/dL Final   Creatinine, Ser  Date Value Ref Range Status  03/09/2023 0.59 (L) 0.76 - 1.27 mg/dL Final         Failed - Last BP in normal range    BP Readings from Last 1 Encounters:  02/29/24 (!) 146/70         Failed - Lipid Panel in normal range within the last 12 months    Cholesterol, Total  Date Value Ref Range Status  08/25/2017 175 100 - 199 mg/dL Final   LDL Calculated  Date Value Ref Range Status  08/25/2017 113 (H) 0 - 99 mg/dL Final   HDL  Date Value Ref Range Status  08/25/2017 26 (L) >39 mg/dL Final   Triglycerides  Date Value Ref Range Status  08/25/2017 180 (H) 0 - 149 mg/dL Final         Passed - Completed PHQ-2 or PHQ-9 in the last 360 days      Passed - Valid encounter within last 6 months    Recent Outpatient Visits           2 months ago Annual physical exam   [facility] Simmons-Robinson, [place], MD   5 months ago Mild episode of recurrent major depressive disorder   [facility] Simmons-Robinson, [place], MD   7 months ago Anxiety   [facility] [place], [place], MD   9 months ago Depression, recurrent   [facility] [place], [place], MD   10 months ago Moderate episode of recurrent major depressive disorder [facility])   [facility] [place], Nancyann BRAVO, MD

## 2024-06-01 ENCOUNTER — Other Ambulatory Visit: Payer: Self-pay | Admitting: Family Medicine

## 2024-06-01 DIAGNOSIS — R110 Nausea: Secondary | ICD-10-CM

## 2024-06-01 DIAGNOSIS — I10 Essential (primary) hypertension: Secondary | ICD-10-CM

## 2024-06-01 NOTE — Telephone Encounter (Signed)
 LOV- 02/29/2024 NOV- 07/03/2024 LRF- 02/29/2024 Outpatient Medication Detail   Disp Refills Start End   prochlorperazine  (COMPAZINE ) 10 MG tablet 90 tablet 1 02/29/2024 --   Sig - Route: Take 1 tablet (10 mg total) by mouth every 8 (eight) hours as needed. - Oral   Sent to pharmacy as: prochlorperazine  (COMPAZINE ) 10 MG tablet   E-Prescribing Status: Receipt confirmed by pharmacy (02/29/2024 11:16 PM EDT)

## 2024-06-07 ENCOUNTER — Ambulatory Visit: Payer: Self-pay

## 2024-07-03 ENCOUNTER — Ambulatory Visit: Admitting: Family Medicine

## 2024-11-01 ENCOUNTER — Ambulatory Visit
